# Patient Record
Sex: Female | Born: 1945 | Race: White | Hispanic: No | Smoking: Former smoker
Health system: Southern US, Community
[De-identification: ages and names within clinical notes are randomized; demographics above are authoritative.]

## PROBLEM LIST (undated history)

## (undated) DIAGNOSIS — M5380 Other specified dorsopathies, site unspecified: Secondary | ICD-10-CM

## (undated) DIAGNOSIS — M8580 Other specified disorders of bone density and structure, unspecified site: Secondary | ICD-10-CM

## (undated) DIAGNOSIS — K279 Peptic ulcer, site unspecified, unspecified as acute or chronic, without hemorrhage or perforation: Secondary | ICD-10-CM

## (undated) DIAGNOSIS — J449 Chronic obstructive pulmonary disease, unspecified: Secondary | ICD-10-CM

## (undated) DIAGNOSIS — I10 Essential (primary) hypertension: Secondary | ICD-10-CM

## (undated) DIAGNOSIS — G4700 Insomnia, unspecified: Secondary | ICD-10-CM

## (undated) DIAGNOSIS — M47816 Spondylosis without myelopathy or radiculopathy, lumbar region: Secondary | ICD-10-CM

## (undated) DIAGNOSIS — E079 Disorder of thyroid, unspecified: Secondary | ICD-10-CM

## (undated) DIAGNOSIS — G600 Hereditary motor and sensory neuropathy: Secondary | ICD-10-CM

## (undated) DIAGNOSIS — F17200 Nicotine dependence, unspecified, uncomplicated: Secondary | ICD-10-CM

## (undated) DIAGNOSIS — B009 Herpesviral infection, unspecified: Secondary | ICD-10-CM

## (undated) DIAGNOSIS — F32A Depression, unspecified: Secondary | ICD-10-CM

## (undated) DIAGNOSIS — IMO0002 Reserved for concepts with insufficient information to code with codable children: Secondary | ICD-10-CM

## (undated) DIAGNOSIS — F329 Major depressive disorder, single episode, unspecified: Secondary | ICD-10-CM

## (undated) DIAGNOSIS — M1990 Unspecified osteoarthritis, unspecified site: Secondary | ICD-10-CM

## (undated) DIAGNOSIS — G609 Hereditary and idiopathic neuropathy, unspecified: Secondary | ICD-10-CM

## (undated) DIAGNOSIS — M47814 Spondylosis without myelopathy or radiculopathy, thoracic region: Secondary | ICD-10-CM

## (undated) DIAGNOSIS — M48061 Spinal stenosis, lumbar region without neurogenic claudication: Secondary | ICD-10-CM

## (undated) DIAGNOSIS — E1142 Type 2 diabetes mellitus with diabetic polyneuropathy: Secondary | ICD-10-CM

## (undated) DIAGNOSIS — G544 Lumbosacral root disorders, not elsewhere classified: Secondary | ICD-10-CM

## (undated) DIAGNOSIS — E785 Hyperlipidemia, unspecified: Secondary | ICD-10-CM

## (undated) DIAGNOSIS — E669 Obesity, unspecified: Secondary | ICD-10-CM

## (undated) DIAGNOSIS — Z780 Asymptomatic menopausal state: Secondary | ICD-10-CM

## (undated) DIAGNOSIS — N319 Neuromuscular dysfunction of bladder, unspecified: Secondary | ICD-10-CM

## (undated) HISTORY — DX: Hyperlipidemia, unspecified: E78.5

## (undated) HISTORY — DX: Chronic obstructive pulmonary disease, unspecified: J44.9

## (undated) HISTORY — DX: Spondylosis without myelopathy or radiculopathy, lumbar region: M47.816

## (undated) HISTORY — DX: Unspecified osteoarthritis, unspecified site: M19.90

## (undated) HISTORY — DX: Type 2 diabetes mellitus with diabetic polyneuropathy: E11.42

## (undated) HISTORY — DX: Spondylosis without myelopathy or radiculopathy, thoracic region: M47.814

## (undated) HISTORY — DX: Herpesviral infection, unspecified: B00.9

## (undated) HISTORY — DX: Nicotine dependence, unspecified, uncomplicated: F17.200

## (undated) HISTORY — DX: Other specified disorders of bone density and structure, unspecified site: M85.80

## (undated) HISTORY — DX: Obesity, unspecified: E66.9

## (undated) HISTORY — DX: Disorder of thyroid, unspecified: E07.9

## (undated) HISTORY — DX: Neuromuscular dysfunction of bladder, unspecified: N31.9

## (undated) HISTORY — DX: Insomnia, unspecified: G47.00

## (undated) HISTORY — DX: Peptic ulcer, site unspecified, unspecified as acute or chronic, without hemorrhage or perforation: K27.9

## (undated) HISTORY — DX: Spinal stenosis, lumbar region without neurogenic claudication: M48.061

## (undated) HISTORY — DX: Asymptomatic menopausal state: Z78.0

## (undated) HISTORY — DX: Hereditary and idiopathic neuropathy, unspecified: G60.9

## (undated) HISTORY — PX: OTHER SURGICAL HISTORY: SHX169

## (undated) HISTORY — PX: CHOLECYSTECTOMY: SHX55

## (undated) HISTORY — DX: Essential (primary) hypertension: I10

## (undated) HISTORY — DX: Lumbosacral root disorders, not elsewhere classified: G54.4

## (undated) HISTORY — PX: REPLACEMENT TOTAL KNEE: SUR1224

## (undated) HISTORY — DX: Reserved for concepts with insufficient information to code with codable children: IMO0002

## (undated) HISTORY — DX: Other specified dorsopathies, site unspecified: M53.80

## (undated) HISTORY — DX: Major depressive disorder, single episode, unspecified: F32.9

## (undated) HISTORY — DX: Depression, unspecified: F32.A

## (undated) HISTORY — DX: Hereditary motor and sensory neuropathy: G60.0

## (undated) HISTORY — PX: ANKLE RECONSTRUCTION: SHX1151

---

## 2005-04-27 ENCOUNTER — Ambulatory Visit: Payer: Self-pay | Admitting: Family Medicine

## 2005-05-15 ENCOUNTER — Ambulatory Visit: Payer: Self-pay | Admitting: Family Medicine

## 2005-06-07 ENCOUNTER — Ambulatory Visit: Payer: Self-pay | Admitting: Family Medicine

## 2005-06-13 ENCOUNTER — Ambulatory Visit: Payer: Self-pay | Admitting: Family Medicine

## 2005-09-19 ENCOUNTER — Ambulatory Visit: Payer: Self-pay | Admitting: Family Medicine

## 2005-09-28 ENCOUNTER — Ambulatory Visit: Payer: Self-pay | Admitting: Family Medicine

## 2005-10-05 ENCOUNTER — Ambulatory Visit (HOSPITAL_COMMUNITY): Admission: RE | Admit: 2005-10-05 | Discharge: 2005-10-05 | Payer: Self-pay | Admitting: Family Medicine

## 2005-10-31 ENCOUNTER — Ambulatory Visit: Payer: Self-pay | Admitting: Family Medicine

## 2005-12-25 ENCOUNTER — Ambulatory Visit: Payer: Self-pay | Admitting: Family Medicine

## 2006-01-31 ENCOUNTER — Ambulatory Visit: Payer: Self-pay | Admitting: Family Medicine

## 2006-02-27 ENCOUNTER — Ambulatory Visit: Payer: Self-pay | Admitting: Family Medicine

## 2006-03-27 ENCOUNTER — Ambulatory Visit: Payer: Self-pay | Admitting: Family Medicine

## 2006-03-30 ENCOUNTER — Ambulatory Visit: Payer: Self-pay | Admitting: Physical Medicine & Rehabilitation

## 2006-04-06 ENCOUNTER — Ambulatory Visit: Payer: Self-pay | Admitting: Family Medicine

## 2006-05-15 DIAGNOSIS — F17200 Nicotine dependence, unspecified, uncomplicated: Secondary | ICD-10-CM | POA: Insufficient documentation

## 2006-05-15 DIAGNOSIS — E1140 Type 2 diabetes mellitus with diabetic neuropathy, unspecified: Secondary | ICD-10-CM | POA: Insufficient documentation

## 2006-05-15 DIAGNOSIS — I10 Essential (primary) hypertension: Secondary | ICD-10-CM | POA: Insufficient documentation

## 2006-05-15 DIAGNOSIS — K273 Acute peptic ulcer, site unspecified, without hemorrhage or perforation: Secondary | ICD-10-CM | POA: Insufficient documentation

## 2006-05-15 DIAGNOSIS — E785 Hyperlipidemia, unspecified: Secondary | ICD-10-CM | POA: Insufficient documentation

## 2006-05-15 DIAGNOSIS — E1169 Type 2 diabetes mellitus with other specified complication: Secondary | ICD-10-CM | POA: Insufficient documentation

## 2006-05-15 DIAGNOSIS — F339 Major depressive disorder, recurrent, unspecified: Secondary | ICD-10-CM | POA: Insufficient documentation

## 2006-05-15 DIAGNOSIS — G609 Hereditary and idiopathic neuropathy, unspecified: Secondary | ICD-10-CM | POA: Insufficient documentation

## 2006-05-15 DIAGNOSIS — E039 Hypothyroidism, unspecified: Secondary | ICD-10-CM | POA: Insufficient documentation

## 2006-05-28 ENCOUNTER — Ambulatory Visit: Payer: Self-pay | Admitting: Family Medicine

## 2006-06-22 ENCOUNTER — Ambulatory Visit: Payer: Self-pay | Admitting: Physical Medicine & Rehabilitation

## 2006-06-25 DIAGNOSIS — M5137 Other intervertebral disc degeneration, lumbosacral region: Secondary | ICD-10-CM | POA: Insufficient documentation

## 2006-06-26 ENCOUNTER — Encounter: Payer: Self-pay | Admitting: Family Medicine

## 2006-06-26 LAB — CONVERTED CEMR LAB
BUN: 27 mg/dL — ABNORMAL HIGH (ref 6–23)
CO2: 30 meq/L (ref 19–32)
Calcium: 9.9 mg/dL (ref 8.4–10.5)
Cholesterol: 152 mg/dL (ref 0–200)
Creatinine, Ser: 1.35 mg/dL — ABNORMAL HIGH (ref 0.40–1.20)
Glucose, Bld: 109 mg/dL — ABNORMAL HIGH (ref 70–99)
Total Bilirubin: 0.7 mg/dL (ref 0.3–1.2)
Total CHOL/HDL Ratio: 2.7
Triglycerides: 155 mg/dL — ABNORMAL HIGH (ref <150)
VLDL: 31 mg/dL (ref 0–40)

## 2006-06-29 ENCOUNTER — Ambulatory Visit: Payer: Self-pay | Admitting: Family Medicine

## 2006-06-29 DIAGNOSIS — I723 Aneurysm of iliac artery: Secondary | ICD-10-CM | POA: Insufficient documentation

## 2006-07-23 ENCOUNTER — Encounter: Payer: Self-pay | Admitting: Family Medicine

## 2006-08-15 ENCOUNTER — Ambulatory Visit: Payer: Self-pay | Admitting: Family Medicine

## 2006-08-16 ENCOUNTER — Encounter: Payer: Self-pay | Admitting: Family Medicine

## 2006-08-17 ENCOUNTER — Encounter: Payer: Self-pay | Admitting: Family Medicine

## 2006-08-17 ENCOUNTER — Telehealth (INDEPENDENT_AMBULATORY_CARE_PROVIDER_SITE_OTHER): Payer: Self-pay | Admitting: *Deleted

## 2006-08-29 ENCOUNTER — Ambulatory Visit: Payer: Self-pay | Admitting: Cardiology

## 2006-08-29 ENCOUNTER — Encounter: Payer: Self-pay | Admitting: Family Medicine

## 2006-08-31 ENCOUNTER — Ambulatory Visit: Payer: Self-pay | Admitting: Physical Medicine & Rehabilitation

## 2006-09-27 ENCOUNTER — Ambulatory Visit: Payer: Self-pay | Admitting: Family Medicine

## 2006-09-27 ENCOUNTER — Telehealth: Payer: Self-pay | Admitting: Family Medicine

## 2006-11-26 ENCOUNTER — Ambulatory Visit: Payer: Self-pay | Admitting: Family Medicine

## 2006-12-06 ENCOUNTER — Telehealth: Payer: Self-pay | Admitting: Family Medicine

## 2006-12-07 ENCOUNTER — Ambulatory Visit: Payer: Self-pay | Admitting: Physical Medicine & Rehabilitation

## 2006-12-19 ENCOUNTER — Telehealth: Payer: Self-pay | Admitting: Family Medicine

## 2006-12-24 ENCOUNTER — Telehealth (INDEPENDENT_AMBULATORY_CARE_PROVIDER_SITE_OTHER): Payer: Self-pay | Admitting: *Deleted

## 2007-02-19 ENCOUNTER — Ambulatory Visit: Payer: Self-pay | Admitting: Physical Medicine & Rehabilitation

## 2007-05-20 ENCOUNTER — Telehealth: Payer: Self-pay | Admitting: Family Medicine

## 2007-05-22 ENCOUNTER — Ambulatory Visit: Payer: Self-pay | Admitting: Family Medicine

## 2007-05-22 DIAGNOSIS — J449 Chronic obstructive pulmonary disease, unspecified: Secondary | ICD-10-CM | POA: Insufficient documentation

## 2007-05-31 ENCOUNTER — Ambulatory Visit: Payer: Self-pay | Admitting: Physical Medicine & Rehabilitation

## 2007-05-31 ENCOUNTER — Telehealth: Payer: Self-pay | Admitting: Family Medicine

## 2007-06-10 ENCOUNTER — Encounter: Payer: Self-pay | Admitting: Family Medicine

## 2007-06-13 ENCOUNTER — Encounter (INDEPENDENT_AMBULATORY_CARE_PROVIDER_SITE_OTHER): Payer: Self-pay | Admitting: *Deleted

## 2007-08-30 ENCOUNTER — Ambulatory Visit: Payer: Self-pay | Admitting: Physical Medicine & Rehabilitation

## 2007-10-04 ENCOUNTER — Ambulatory Visit: Payer: Self-pay | Admitting: Family Medicine

## 2007-10-04 LAB — CONVERTED CEMR LAB: Hgb A1c MFr Bld: 7.5 %

## 2007-10-10 ENCOUNTER — Telehealth: Payer: Self-pay | Admitting: Family Medicine

## 2007-10-16 ENCOUNTER — Encounter: Payer: Self-pay | Admitting: Family Medicine

## 2007-10-30 ENCOUNTER — Ambulatory Visit: Payer: Self-pay | Admitting: Cardiology

## 2007-11-11 ENCOUNTER — Encounter: Payer: Self-pay | Admitting: Family Medicine

## 2008-01-23 ENCOUNTER — Encounter: Payer: Self-pay | Admitting: Family Medicine

## 2008-01-23 ENCOUNTER — Telehealth: Payer: Self-pay | Admitting: Family Medicine

## 2008-01-24 ENCOUNTER — Encounter: Payer: Self-pay | Admitting: Family Medicine

## 2008-01-27 LAB — CONVERTED CEMR LAB: Creatinine, Ser: 1.19 mg/dL (ref 0.40–1.20)

## 2008-02-26 ENCOUNTER — Telehealth: Payer: Self-pay | Admitting: Family Medicine

## 2008-03-02 ENCOUNTER — Ambulatory Visit: Payer: Self-pay | Admitting: Family Medicine

## 2008-03-02 ENCOUNTER — Telehealth (INDEPENDENT_AMBULATORY_CARE_PROVIDER_SITE_OTHER): Payer: Self-pay | Admitting: *Deleted

## 2008-03-02 LAB — CONVERTED CEMR LAB: Blood Glucose, Fasting: 143 mg/dL

## 2008-03-27 ENCOUNTER — Ambulatory Visit: Payer: Self-pay | Admitting: Physical Medicine & Rehabilitation

## 2008-04-04 ENCOUNTER — Encounter
Admission: RE | Admit: 2008-04-04 | Discharge: 2008-04-04 | Payer: Self-pay | Admitting: Physical Medicine & Rehabilitation

## 2008-05-28 ENCOUNTER — Ambulatory Visit: Payer: Self-pay | Admitting: Family Medicine

## 2008-05-28 DIAGNOSIS — G4700 Insomnia, unspecified: Secondary | ICD-10-CM | POA: Insufficient documentation

## 2008-05-29 ENCOUNTER — Encounter: Payer: Self-pay | Admitting: Family Medicine

## 2008-06-01 ENCOUNTER — Encounter: Payer: Self-pay | Admitting: Family Medicine

## 2008-06-01 LAB — CONVERTED CEMR LAB
AST: 20 units/L (ref 0–37)
BUN: 20 mg/dL (ref 6–23)
Calcium: 9.7 mg/dL (ref 8.4–10.5)
Chloride: 101 meq/L (ref 96–112)
Creatinine, Ser: 1.1 mg/dL (ref 0.40–1.20)
Glucose, Bld: 192 mg/dL — ABNORMAL HIGH (ref 70–99)
HDL: 41 mg/dL (ref >39)
TSH: 14.931 microintl units/mL — ABNORMAL HIGH (ref 0.350–4.50)
Total CHOL/HDL Ratio: 4.2
Triglycerides: 245 mg/dL — ABNORMAL HIGH (ref <150)

## 2008-06-08 ENCOUNTER — Encounter: Payer: Self-pay | Admitting: Family Medicine

## 2008-06-12 ENCOUNTER — Ambulatory Visit: Payer: Self-pay | Admitting: Physical Medicine & Rehabilitation

## 2008-06-19 ENCOUNTER — Encounter
Admission: RE | Admit: 2008-06-19 | Discharge: 2008-08-06 | Payer: Self-pay | Admitting: Physical Medicine & Rehabilitation

## 2008-06-19 ENCOUNTER — Ambulatory Visit: Payer: Self-pay | Admitting: Family Medicine

## 2008-07-08 ENCOUNTER — Inpatient Hospital Stay (HOSPITAL_COMMUNITY): Admission: AD | Admit: 2008-07-08 | Discharge: 2008-07-09 | Payer: Self-pay | Admitting: Cardiology

## 2008-07-08 ENCOUNTER — Ambulatory Visit: Payer: Self-pay | Admitting: Cardiology

## 2008-07-09 ENCOUNTER — Encounter: Payer: Self-pay | Admitting: Cardiology

## 2008-07-22 ENCOUNTER — Ambulatory Visit: Payer: Self-pay | Admitting: Cardiology

## 2008-07-28 ENCOUNTER — Other Ambulatory Visit: Admission: RE | Admit: 2008-07-28 | Discharge: 2008-07-28 | Payer: Self-pay | Admitting: Family Medicine

## 2008-07-28 ENCOUNTER — Ambulatory Visit: Payer: Self-pay | Admitting: Family Medicine

## 2008-07-28 DIAGNOSIS — Z780 Asymptomatic menopausal state: Secondary | ICD-10-CM | POA: Insufficient documentation

## 2008-08-04 ENCOUNTER — Encounter: Admission: RE | Admit: 2008-08-04 | Discharge: 2008-08-04 | Payer: Self-pay | Admitting: Family Medicine

## 2008-08-04 ENCOUNTER — Encounter: Payer: Self-pay | Admitting: Family Medicine

## 2008-08-04 DIAGNOSIS — M810 Age-related osteoporosis without current pathological fracture: Secondary | ICD-10-CM | POA: Insufficient documentation

## 2008-08-04 LAB — HM MAMMOGRAPHY: HM Mammogram: NEGATIVE

## 2008-08-12 ENCOUNTER — Ambulatory Visit: Payer: Self-pay | Admitting: Cardiology

## 2008-08-21 ENCOUNTER — Ambulatory Visit: Payer: Self-pay | Admitting: Physical Medicine & Rehabilitation

## 2008-08-21 ENCOUNTER — Encounter: Payer: Self-pay | Admitting: Family Medicine

## 2008-08-24 ENCOUNTER — Encounter: Payer: Self-pay | Admitting: Family Medicine

## 2008-09-09 ENCOUNTER — Encounter
Admission: RE | Admit: 2008-09-09 | Discharge: 2008-09-10 | Payer: Self-pay | Admitting: Physical Medicine & Rehabilitation

## 2008-09-10 ENCOUNTER — Ambulatory Visit: Payer: Self-pay | Admitting: Physical Medicine & Rehabilitation

## 2008-10-07 ENCOUNTER — Telehealth: Payer: Self-pay | Admitting: Family Medicine

## 2008-10-09 ENCOUNTER — Ambulatory Visit: Payer: Self-pay | Admitting: Family Medicine

## 2008-10-09 ENCOUNTER — Telehealth: Payer: Self-pay | Admitting: Family Medicine

## 2008-10-09 ENCOUNTER — Ambulatory Visit: Payer: Self-pay | Admitting: Physical Medicine & Rehabilitation

## 2008-10-12 ENCOUNTER — Encounter: Payer: Self-pay | Admitting: Family Medicine

## 2008-10-15 LAB — CONVERTED CEMR LAB: Vit D, 25-Hydroxy: 57 ng/mL (ref 30–89)

## 2008-10-19 ENCOUNTER — Ambulatory Visit: Payer: Self-pay | Admitting: Family Medicine

## 2008-11-01 ENCOUNTER — Observation Stay (HOSPITAL_COMMUNITY): Admission: EM | Admit: 2008-11-01 | Discharge: 2008-11-01 | Payer: Self-pay | Admitting: Emergency Medicine

## 2008-11-04 ENCOUNTER — Ambulatory Visit: Payer: Self-pay | Admitting: Family Medicine

## 2008-11-10 ENCOUNTER — Ambulatory Visit: Payer: Self-pay | Admitting: Family Medicine

## 2008-11-10 ENCOUNTER — Telehealth (INDEPENDENT_AMBULATORY_CARE_PROVIDER_SITE_OTHER): Payer: Self-pay | Admitting: *Deleted

## 2008-11-10 LAB — CONVERTED CEMR LAB
Glucose, Urine, Semiquant: NEGATIVE
Ketones, urine, test strip: NEGATIVE
Protein, U semiquant: 30
Specific Gravity, Urine: 1.02
pH: 6

## 2008-12-03 ENCOUNTER — Ambulatory Visit: Payer: Self-pay | Admitting: Family Medicine

## 2008-12-03 DIAGNOSIS — E1142 Type 2 diabetes mellitus with diabetic polyneuropathy: Secondary | ICD-10-CM | POA: Insufficient documentation

## 2008-12-03 LAB — CONVERTED CEMR LAB: Hgb A1c MFr Bld: 7 %

## 2008-12-04 ENCOUNTER — Ambulatory Visit: Payer: Self-pay | Admitting: Physical Medicine & Rehabilitation

## 2008-12-04 ENCOUNTER — Encounter: Payer: Self-pay | Admitting: Family Medicine

## 2008-12-04 LAB — CONVERTED CEMR LAB: TSH: 1.148 microintl units/mL (ref 0.350–4.500)

## 2008-12-16 ENCOUNTER — Telehealth (INDEPENDENT_AMBULATORY_CARE_PROVIDER_SITE_OTHER): Payer: Self-pay | Admitting: *Deleted

## 2009-01-22 ENCOUNTER — Ambulatory Visit: Payer: Self-pay | Admitting: Physical Medicine & Rehabilitation

## 2009-02-06 DIAGNOSIS — M129 Arthropathy, unspecified: Secondary | ICD-10-CM

## 2009-02-06 DIAGNOSIS — K279 Peptic ulcer, site unspecified, unspecified as acute or chronic, without hemorrhage or perforation: Secondary | ICD-10-CM | POA: Insufficient documentation

## 2009-02-06 DIAGNOSIS — G600 Hereditary motor and sensory neuropathy: Secondary | ICD-10-CM | POA: Insufficient documentation

## 2009-02-06 DIAGNOSIS — G589 Mononeuropathy, unspecified: Secondary | ICD-10-CM | POA: Insufficient documentation

## 2009-02-06 DIAGNOSIS — N319 Neuromuscular dysfunction of bladder, unspecified: Secondary | ICD-10-CM | POA: Insufficient documentation

## 2009-02-06 DIAGNOSIS — E669 Obesity, unspecified: Secondary | ICD-10-CM | POA: Insufficient documentation

## 2009-02-06 DIAGNOSIS — R55 Syncope and collapse: Secondary | ICD-10-CM | POA: Insufficient documentation

## 2009-02-06 DIAGNOSIS — M1990 Unspecified osteoarthritis, unspecified site: Secondary | ICD-10-CM | POA: Insufficient documentation

## 2009-02-17 ENCOUNTER — Encounter: Payer: Self-pay | Admitting: Cardiology

## 2009-02-17 ENCOUNTER — Ambulatory Visit: Payer: Self-pay | Admitting: Cardiology

## 2009-02-25 ENCOUNTER — Telehealth: Payer: Self-pay | Admitting: Cardiology

## 2009-03-03 ENCOUNTER — Encounter: Payer: Self-pay | Admitting: Cardiology

## 2009-03-04 ENCOUNTER — Encounter: Admission: RE | Admit: 2009-03-04 | Discharge: 2009-03-04 | Payer: Self-pay | Admitting: Cardiology

## 2009-03-12 ENCOUNTER — Ambulatory Visit: Payer: Self-pay | Admitting: Physical Medicine & Rehabilitation

## 2009-04-02 ENCOUNTER — Ambulatory Visit: Payer: Self-pay | Admitting: Family Medicine

## 2009-04-13 ENCOUNTER — Encounter
Admission: RE | Admit: 2009-04-13 | Discharge: 2009-04-15 | Payer: Self-pay | Admitting: Physical Medicine & Rehabilitation

## 2009-04-15 ENCOUNTER — Ambulatory Visit: Payer: Self-pay | Admitting: Physical Medicine & Rehabilitation

## 2009-06-29 ENCOUNTER — Encounter
Admission: RE | Admit: 2009-06-29 | Discharge: 2009-07-28 | Payer: Self-pay | Admitting: Physical Medicine & Rehabilitation

## 2009-06-29 ENCOUNTER — Ambulatory Visit: Payer: Self-pay | Admitting: Physical Medicine & Rehabilitation

## 2009-07-05 ENCOUNTER — Ambulatory Visit: Payer: Self-pay | Admitting: Family Medicine

## 2009-07-15 ENCOUNTER — Telehealth (INDEPENDENT_AMBULATORY_CARE_PROVIDER_SITE_OTHER): Payer: Self-pay | Admitting: *Deleted

## 2009-07-16 ENCOUNTER — Encounter: Payer: Self-pay | Admitting: Family Medicine

## 2009-07-19 LAB — CONVERTED CEMR LAB
ALT: 21 units/L (ref 0–35)
AST: 20 units/L (ref 0–37)
BUN: 20 mg/dL (ref 6–23)
CO2: 25 meq/L (ref 19–32)
Calcium: 9.8 mg/dL (ref 8.4–10.5)
Cholesterol: 135 mg/dL (ref 0–200)
Creatinine, Ser: 1.4 mg/dL — ABNORMAL HIGH (ref 0.40–1.20)
HCT: 46.4 % — ABNORMAL HIGH (ref 36.0–46.0)
HDL: 46 mg/dL (ref >39)
Hemoglobin: 15.7 g/dL — ABNORMAL HIGH (ref 12.0–15.0)
MCV: 97.7 fL (ref 78.0–100.0)
Platelets: 264 10*3/uL (ref 150–400)
RDW: 12.6 % (ref 11.5–15.5)
T3, Free: 3.1 pg/mL (ref 2.3–4.2)
Total Bilirubin: 0.9 mg/dL (ref 0.3–1.2)
Total CHOL/HDL Ratio: 2.9
VLDL: 36 mg/dL (ref 0–40)

## 2009-08-09 ENCOUNTER — Ambulatory Visit: Payer: Self-pay | Admitting: Physical Medicine & Rehabilitation

## 2009-08-09 ENCOUNTER — Encounter
Admission: RE | Admit: 2009-08-09 | Discharge: 2009-11-07 | Payer: Self-pay | Admitting: Physical Medicine & Rehabilitation

## 2009-08-09 ENCOUNTER — Telehealth: Payer: Self-pay | Admitting: Family Medicine

## 2009-08-20 ENCOUNTER — Encounter
Admission: RE | Admit: 2009-08-20 | Discharge: 2009-08-20 | Payer: Self-pay | Admitting: Physical Medicine & Rehabilitation

## 2009-08-26 ENCOUNTER — Telehealth: Payer: Self-pay | Admitting: Family Medicine

## 2009-09-21 ENCOUNTER — Ambulatory Visit: Payer: Self-pay | Admitting: Family Medicine

## 2009-10-21 ENCOUNTER — Telehealth: Payer: Self-pay | Admitting: Family Medicine

## 2009-11-02 ENCOUNTER — Encounter: Admission: RE | Admit: 2009-11-02 | Discharge: 2009-11-02 | Payer: Self-pay | Admitting: Family Medicine

## 2009-11-02 ENCOUNTER — Ambulatory Visit: Payer: Self-pay | Admitting: Family Medicine

## 2009-11-03 LAB — CONVERTED CEMR LAB
ALT: 29 units/L (ref 0–35)
Amylase: 44 units/L (ref 0–105)
CO2: 23 meq/L (ref 19–32)
Calcium: 10.2 mg/dL (ref 8.4–10.5)
Chloride: 101 meq/L (ref 96–112)
HCT: 48.5 % — ABNORMAL HIGH (ref 36.0–46.0)
Hemoglobin: 16 g/dL — ABNORMAL HIGH (ref 12.0–15.0)
Lymphocytes Relative: 22 % (ref 12–46)
MCHC: 33 g/dL (ref 30.0–36.0)
MCV: 94 fL (ref 78.0–100.0)
Monocytes Relative: 8 % (ref 3–12)
Platelets: 287 10*3/uL (ref 150–400)
RDW: 12.6 % (ref 11.5–15.5)
Sodium: 137 meq/L (ref 135–145)
Total Protein: 7.1 g/dL (ref 6.0–8.3)

## 2009-11-04 ENCOUNTER — Encounter
Admission: RE | Admit: 2009-11-04 | Discharge: 2009-11-08 | Payer: Self-pay | Admitting: Physical Medicine & Rehabilitation

## 2009-11-08 ENCOUNTER — Ambulatory Visit: Payer: Self-pay | Admitting: Physical Medicine & Rehabilitation

## 2009-11-09 ENCOUNTER — Ambulatory Visit: Payer: Self-pay | Admitting: Family Medicine

## 2009-11-09 DIAGNOSIS — K2900 Acute gastritis without bleeding: Secondary | ICD-10-CM | POA: Insufficient documentation

## 2009-11-22 ENCOUNTER — Telehealth: Payer: Self-pay | Admitting: Family Medicine

## 2009-11-30 ENCOUNTER — Ambulatory Visit: Payer: Self-pay | Admitting: Family Medicine

## 2009-12-07 ENCOUNTER — Encounter: Payer: Self-pay | Admitting: Family Medicine

## 2009-12-28 ENCOUNTER — Telehealth: Payer: Self-pay | Admitting: Family Medicine

## 2009-12-31 ENCOUNTER — Encounter: Payer: Self-pay | Admitting: Family Medicine

## 2010-01-13 ENCOUNTER — Telehealth (INDEPENDENT_AMBULATORY_CARE_PROVIDER_SITE_OTHER): Payer: Self-pay | Admitting: *Deleted

## 2010-01-27 ENCOUNTER — Encounter
Admission: RE | Admit: 2010-01-27 | Discharge: 2010-04-27 | Payer: Self-pay | Admitting: Physical Medicine & Rehabilitation

## 2010-02-01 ENCOUNTER — Ambulatory Visit: Payer: Self-pay | Admitting: Physical Medicine & Rehabilitation

## 2010-02-04 ENCOUNTER — Encounter: Admission: RE | Admit: 2010-02-04 | Discharge: 2010-02-04 | Payer: Self-pay | Admitting: Family Medicine

## 2010-02-04 ENCOUNTER — Ambulatory Visit: Payer: Self-pay | Admitting: Family Medicine

## 2010-02-04 DIAGNOSIS — R1031 Right lower quadrant pain: Secondary | ICD-10-CM | POA: Insufficient documentation

## 2010-02-08 ENCOUNTER — Telehealth: Payer: Self-pay | Admitting: Family Medicine

## 2010-02-10 ENCOUNTER — Ambulatory Visit: Payer: Self-pay | Admitting: Family Medicine

## 2010-02-10 DIAGNOSIS — N183 Chronic kidney disease, stage 3 (moderate): Secondary | ICD-10-CM

## 2010-02-10 DIAGNOSIS — N1832 Chronic kidney disease, stage 3b: Secondary | ICD-10-CM | POA: Insufficient documentation

## 2010-02-10 DIAGNOSIS — I951 Orthostatic hypotension: Secondary | ICD-10-CM | POA: Insufficient documentation

## 2010-02-10 DIAGNOSIS — I959 Hypotension, unspecified: Secondary | ICD-10-CM | POA: Insufficient documentation

## 2010-02-10 DIAGNOSIS — B029 Zoster without complications: Secondary | ICD-10-CM | POA: Insufficient documentation

## 2010-02-11 ENCOUNTER — Encounter: Payer: Self-pay | Admitting: Family Medicine

## 2010-02-11 LAB — CONVERTED CEMR LAB
AST: 19 units/L (ref 0–37)
Albumin: 4 g/dL (ref 3.5–5.2)
Alkaline Phosphatase: 90 units/L (ref 39–117)
BUN: 24 mg/dL — ABNORMAL HIGH (ref 6–23)
Hemoglobin: 14 g/dL (ref 12.0–15.0)
MCHC: 31.7 g/dL (ref 30.0–36.0)
Potassium: 5 meq/L (ref 3.5–5.3)
RDW: 13.8 % (ref 11.5–15.5)
Sodium: 138 meq/L (ref 135–145)

## 2010-02-16 ENCOUNTER — Telehealth: Payer: Self-pay | Admitting: Family Medicine

## 2010-02-18 ENCOUNTER — Telehealth (INDEPENDENT_AMBULATORY_CARE_PROVIDER_SITE_OTHER): Payer: Self-pay | Admitting: *Deleted

## 2010-02-24 ENCOUNTER — Ambulatory Visit: Payer: Self-pay | Admitting: Family Medicine

## 2010-02-25 LAB — CONVERTED CEMR LAB
CO2: 28 meq/L (ref 19–32)
Calcium: 9.5 mg/dL (ref 8.4–10.5)
Chloride: 104 meq/L (ref 96–112)
Glucose, Bld: 149 mg/dL — ABNORMAL HIGH (ref 70–99)
Sodium: 141 meq/L (ref 135–145)

## 2010-03-09 ENCOUNTER — Ambulatory Visit: Payer: Self-pay | Admitting: Cardiology

## 2010-03-09 ENCOUNTER — Encounter: Payer: Self-pay | Admitting: Cardiology

## 2010-03-09 ENCOUNTER — Ambulatory Visit: Payer: Self-pay | Admitting: Family Medicine

## 2010-03-09 DIAGNOSIS — H60399 Other infective otitis externa, unspecified ear: Secondary | ICD-10-CM | POA: Insufficient documentation

## 2010-03-09 DIAGNOSIS — R609 Edema, unspecified: Secondary | ICD-10-CM | POA: Insufficient documentation

## 2010-03-11 ENCOUNTER — Telehealth: Payer: Self-pay | Admitting: Family Medicine

## 2010-03-15 ENCOUNTER — Encounter: Admission: RE | Admit: 2010-03-15 | Discharge: 2010-03-15 | Payer: Self-pay | Admitting: Cardiology

## 2010-03-17 ENCOUNTER — Ambulatory Visit: Payer: Self-pay | Admitting: Physical Medicine & Rehabilitation

## 2010-03-25 ENCOUNTER — Ambulatory Visit: Payer: Self-pay | Admitting: Family Medicine

## 2010-03-25 DIAGNOSIS — R112 Nausea with vomiting, unspecified: Secondary | ICD-10-CM | POA: Insufficient documentation

## 2010-03-26 ENCOUNTER — Encounter: Payer: Self-pay | Admitting: Family Medicine

## 2010-03-28 LAB — CONVERTED CEMR LAB
BUN: 13 mg/dL (ref 6–23)
CO2: 26 meq/L (ref 19–32)
Calcium: 10.4 mg/dL (ref 8.4–10.5)
Creatinine, Ser: 1.1 mg/dL (ref 0.40–1.20)
Glucose, Bld: 156 mg/dL — ABNORMAL HIGH (ref 70–99)

## 2010-04-05 ENCOUNTER — Encounter: Payer: Self-pay | Admitting: Family Medicine

## 2010-04-06 ENCOUNTER — Encounter: Payer: Self-pay | Admitting: Family Medicine

## 2010-04-28 ENCOUNTER — Encounter: Payer: Self-pay | Admitting: Family Medicine

## 2010-05-06 ENCOUNTER — Ambulatory Visit: Payer: Self-pay | Admitting: Family Medicine

## 2010-06-13 ENCOUNTER — Encounter
Admission: RE | Admit: 2010-06-13 | Discharge: 2010-06-17 | Payer: Self-pay | Source: Home / Self Care | Attending: Physical Medicine & Rehabilitation | Admitting: Physical Medicine & Rehabilitation

## 2010-06-17 ENCOUNTER — Ambulatory Visit: Payer: Self-pay | Admitting: Physical Medicine & Rehabilitation

## 2010-06-21 ENCOUNTER — Telehealth (INDEPENDENT_AMBULATORY_CARE_PROVIDER_SITE_OTHER): Payer: Self-pay | Admitting: *Deleted

## 2010-06-21 ENCOUNTER — Encounter: Admission: RE | Admit: 2010-06-21 | Discharge: 2010-06-21 | Payer: Self-pay | Admitting: Family Medicine

## 2010-06-21 ENCOUNTER — Ambulatory Visit: Payer: Self-pay | Admitting: Family Medicine

## 2010-06-21 DIAGNOSIS — R141 Gas pain: Secondary | ICD-10-CM | POA: Insufficient documentation

## 2010-06-21 DIAGNOSIS — K219 Gastro-esophageal reflux disease without esophagitis: Secondary | ICD-10-CM | POA: Insufficient documentation

## 2010-06-21 DIAGNOSIS — M542 Cervicalgia: Secondary | ICD-10-CM | POA: Insufficient documentation

## 2010-06-21 DIAGNOSIS — R142 Eructation: Secondary | ICD-10-CM

## 2010-06-21 DIAGNOSIS — M62838 Other muscle spasm: Secondary | ICD-10-CM | POA: Insufficient documentation

## 2010-06-21 DIAGNOSIS — R143 Flatulence: Secondary | ICD-10-CM

## 2010-06-22 LAB — CONVERTED CEMR LAB
ALT: 34 units/L (ref 0–35)
Albumin: 4.1 g/dL (ref 3.5–5.2)
Bilirubin, Direct: 0.2 mg/dL (ref 0.0–0.3)
Hgb A1c MFr Bld: 8.6 % — ABNORMAL HIGH (ref <5.7)
TSH: 6.752 microintl units/mL — ABNORMAL HIGH (ref 0.350–4.500)
Total Protein: 6.9 g/dL (ref 6.0–8.3)

## 2010-06-28 ENCOUNTER — Encounter: Payer: Self-pay | Admitting: Family Medicine

## 2010-07-07 ENCOUNTER — Encounter: Payer: Self-pay | Admitting: Family Medicine

## 2010-07-11 ENCOUNTER — Encounter: Payer: Self-pay | Admitting: Family Medicine

## 2010-07-14 ENCOUNTER — Encounter: Payer: Self-pay | Admitting: Family Medicine

## 2010-07-14 ENCOUNTER — Ambulatory Visit: Payer: Self-pay | Admitting: Family Medicine

## 2010-07-14 DIAGNOSIS — I714 Abdominal aortic aneurysm, without rupture, unspecified: Secondary | ICD-10-CM | POA: Insufficient documentation

## 2010-07-14 DIAGNOSIS — I701 Atherosclerosis of renal artery: Secondary | ICD-10-CM | POA: Insufficient documentation

## 2010-07-15 LAB — CONVERTED CEMR LAB
BUN: 13 mg/dL (ref 6–23)
Calcium: 9.2 mg/dL (ref 8.4–10.5)
Creatinine, Ser: 1.08 mg/dL (ref 0.40–1.20)

## 2010-08-12 ENCOUNTER — Ambulatory Visit
Admission: RE | Admit: 2010-08-12 | Discharge: 2010-08-12 | Payer: Self-pay | Source: Home / Self Care | Attending: Vascular Surgery | Admitting: Vascular Surgery

## 2010-08-12 ENCOUNTER — Encounter: Payer: Self-pay | Admitting: Family Medicine

## 2010-08-22 ENCOUNTER — Telehealth: Payer: Self-pay | Admitting: Cardiology

## 2010-08-22 ENCOUNTER — Ambulatory Visit: Admit: 2010-08-22 | Payer: Self-pay | Admitting: Family Medicine

## 2010-08-23 ENCOUNTER — Ambulatory Visit
Admission: RE | Admit: 2010-08-23 | Discharge: 2010-08-23 | Payer: Self-pay | Source: Home / Self Care | Attending: Vascular Surgery | Admitting: Vascular Surgery

## 2010-08-23 ENCOUNTER — Ambulatory Visit: Admission: RE | Admit: 2010-08-23 | Payer: Self-pay | Source: Home / Self Care | Admitting: Vascular Surgery

## 2010-08-26 ENCOUNTER — Ambulatory Visit
Admission: RE | Admit: 2010-08-26 | Discharge: 2010-08-26 | Payer: Self-pay | Source: Home / Self Care | Attending: Vascular Surgery | Admitting: Vascular Surgery

## 2010-08-26 ENCOUNTER — Encounter: Payer: Self-pay | Admitting: Family Medicine

## 2010-08-26 NOTE — Assessment & Plan Note (Addendum)
OFFICE VISIT  [name], [name] MAE DOB:  06-13-1946                                       08/26/2010 [id number]#:[id number]  This is an established patient follow-up.  HISTORY OF PRESENT ILLNESS:  This is a 65 year old female that I had previously seen returns for follow-up on her aortoiliac duplex and renal duplex. 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  At this point the patient notes that she feels her blood pressure is well-controlled at home and that she has had no recent increases in her blood pressure medication.  She is not having any pain in her legs or any type of abdominal symptomatology.  On physical examination today, her blood pressure is 153/100, heart rate of 50, respirations of 12, satting 95% on room air.  She had recently smoked prior to these measurements.  On physical examination today, she does not have any obvious prominent pulse in the right lower quadrant. No abdominal pain whatsoever.  NONINVASIVE VASCULAR IMAGING:  She had a renal artery duplex completed. It demonstrated a greater than 60% stenosis of the left proximal renal artery.  Also an aortoiliac duplex demonstrates a widely patent aorta with no aneurysmal changes.  There is a 2.6 cm x 2.4 cm right common iliac artery aneurysm.  The left common iliac artery is normal in size.  MEDICAL DECISION MAKING:  This is a 65 year old female with a left renal artery stenosis and also a right common iliac artery aneurysm.  As I discussed with her previously, it is less than the 3.5 cm threshold for repair.  I would continue surveillance and check it again in 6 months. In regards to the renal artery stenosis, at this point the data for renal artery stenting is somewhat controversial.  Patient feels that her blood pressure regimen has been stable, and she is not having any complications for her currently blood pressure regimen and would prefer to  manage this medically.  I discussed with her a 74-month trial with her current medications and will repeat the renal duplex in 3 months.  If at that point her blood pressure medications are escalating in number and dosages or if she is having symptomatology from side effects from her medications or if her left kidney is shrinking on the renal artery duplex, at that point I would readdress with her possibly proceeding with a possible renal artery angiogram and stenting.  At this point the patient is fine with this plan of action and will re-do the duplex in 3 months, and she will follow up with me.    [name] Hertz, MD Electronically Signed  BLC/MEDQ  D:  08/26/2010  T:  08/26/2010  Job:  [id number]  cc:   [name], D.O.

## 2010-08-28 ENCOUNTER — Encounter: Payer: Self-pay | Admitting: Family Medicine

## 2010-08-29 ENCOUNTER — Encounter: Payer: Self-pay | Admitting: Cardiology

## 2010-08-30 NOTE — Procedures (Unsigned)
RENAL ARTERY DUPLEX EVALUATION  INDICATION:  Left renal artery stenosis and right common iliac artery aneurysm by CTs on 03/15/2010 and 06/28/2010.  HISTORY: Diabetes:  Yes. Cardiac:  No. Hypertension:  Yes. Smoking:  Yes.  RENAL ARTERY DUPLEX FINDINGS: Aorta-Proximal:  40 cm/s Aorta-Mid:  44 cm/s Aorta-Distal:  52 cm/s Celiac Artery Origin:  128/27 cm/s SMA Origin:  141/30 cm/s                                   RIGHT               LEFT Renal Artery Origin:             Not visualized      201 cm/s Renal Artery Proximal:           67 cm/s             332 cm/s Renal Artery Mid:                56 cm/s             127 cm/s Renal Artery Distal:             61 cm/s             76 cm/s Hilar Acceleration Time (AT): Renal-Aortic Ratio (RAR):        1.5                 7.5 Kidney Size:                     10.3 cm             9.6 cm End Diastolic Ratio (EDR): Resistive Index (RI):            0.71                0.69  IMPRESSION: 1. Doppler velocities and renal aortic ratios suggest >60% stenosis of     the left proximal renal artery and no hemodynamically significant     stenosis of the right renal arteries, based on limited     visualization due to overlying bowel gas patterns and patient body     habitus. 2. The bilateral kidney length measurements are within normal limits;     however, the left kidney measured slightly smaller than the right.     The bilateral interrenal resistive indices were within normal     limits. 3. Patent abdominal aorta, proximal celiac, proximal superior     mesenteric, and bilateral common iliac arteries noted with no     evidence of significant increase in velocities. 4. No evidence of aneurysmal dilatation of the abdominal aorta or left     common iliac artery noted.  The right common iliac artery     demonstrates a 2.6 X 2.4 cm aneurysm, based on limited     visualization.  ___________________________________________ Fransisco Hertz, MD  CH/MEDQ  D:  08/24/2010  T:  08/24/2010  Job:  [id number]

## 2010-09-06 NOTE — Assessment & Plan Note (Signed)
Summary: GERD/ swelling   Vital Signs:  Patient profile:   65 year old female Height:      67 inches Weight:      218 pounds BMI:     34.27 O2 Sat:      96 % on Room air Temp:     98.6 degrees F oral Pulse rate:   65 / minute BP sitting:   142 / 82  (left arm) Cuff size:   large  Vitals Entered By: Payton Spark CMA (June 21, 2010 10:22 AM)  O2 Flow:  Room air CC: Discuss Digestion, walking, dizziness and fluid pill   Primary Care Provider:  Seymour Bars DO  CC:  Discuss Digestion, walking, and dizziness and fluid pill.  History of Present Illness: 65 yo WF presents for abdominal distension.  She is on diuretics every other day which helps with her flud retention.  She is no longer having N/V.  She still has severe indigestion and she is on Dexilant.  She has not had an EGD/ colonoscpy since 04 (in [place]).  Has some intermittent constipation, no melena or hematochezia.  No wt loss but claims to have a poor appetite.  Denies diarrhea.  Had labs done last visit and had a normal lipase and renal function.    She continues to have episodic dizziness esp with moving her head too quickly.  Denies any rotational component and it doesn't happen everytime.  She has gained 5 lbs and is no longer walking.  She has chronic neck pain and HAs with hx of lumbar DDD but has never had her neck looked at.  Dr Jess Barters recently increased her dose of gabapentin.    She is due to have her TSH rechecked after we lowered her dose in July.   She is overdue for A1C and urine microalbumin. Her BP has improved since lst visit and she had a visit with ENT where they treated her chronic otitis external which has much improved.      Current Medications (verified): 1)  Aspirin 81 Mg Tbec (Aspirin) .Marland Kitchen.. 1 Tab By Mouth Qd 2)  Nitrostat 0.4 Mg Subl (Nitroglycerin) .... Dissolve One Tablet Sublingual Every 5 Minutes As Needed Chest Pain. Max Dose 3tabs in 15 Minutes 3)  Levothyroxine Sodium 100 Mcg Tabs  (Levothyroxine Sodium) .Marland Kitchen.. 1 Tab By Mouth Daily 4)  Gabapentin 100 Mg Caps (Gabapentin) .Marland Kitchen.. 1 Capsule By Mouth in [facility] and At [facility].  2 Capsules At Night 5)  Tramadol Hcl 50 Mg Tabs (Tramadol Hcl) .... 2 Tabs By Mouth Tid 6)  Metoprolol Tartrate 100 Mg Tabs (Metoprolol Tartrate) .Marland Kitchen.. 1 Tab By Mouth Bid 7)  Simvastatin 20 Mg Tabs (Simvastatin) .Marland Kitchen.. 1 Tab By Mouth Qhs 8)  Zolpidem Tartrate 10 Mg Tabs (Zolpidem Tartrate) .Marland Kitchen.. 1 Tab By Mouth At Bedtime As Needed Sleep 9)  Glyburide 5 Mg Tabs (Glyburide) .Marland Kitchen.. 1 Tab By Mouth Two Times A Day With Meals 10)  Dexilant 60 Mg Cpdr (Dexlansoprazole) .... Take 1 Tab By Mouth Once Daily 11)  Acyclovir 800 Mg Tabs (Acyclovir) .Marland Kitchen.. 1 Tab By Mouth 5 X A Day X 7 Days 12)  Furosemide 40 Mg Tabs (Furosemide) .Marland Kitchen.. 1 Tab By Mouth Every Other Day 13)  Metformin Hcl 500 Mg Tabs (Metformin Hcl) .... Take 1 Tab By Mouth Two Times A Day  Allergies (verified): No Known Drug Allergies  Past History:  Past Medical History: Reviewed history from 03/09/2010 and no changes required. 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) TOBACCO DEPENDENCE (ICD-305.1) HYPOTHYROIDISM, UNSPECIFIED (ICD-244.9) GASTRIC ULCER ACUTE WITHOUT HEMORRHAGE (ICD-533.30) DIABETES MELLITUS II, UNCOMPLICATED (ICD-250.00) DEPRESSION, MAJOR, RECURRENT (ICD-296.30) OBESITY (ICD-278.00)  Lumbar DDD without spinal stenosis (CT 11-07) [id number],  lipomas R common iliac artery aneurysm 2 CM ,due for CTA 01-2009 subclinical bilat cataracts diabetic peripheral neuropathy - Dr Jess Barters Optho: Dr Jeananne Rama at [facility] cards Dr Jens Som, normal  [facility], [facility], [facility] 12-09  Past Surgical History: Reviewed history from 02/06/2009 and no changes required. Cholecystectomy  R TKR This is a left L5 dorsal ramus injection, left L4 medial branch block,  and bilateral L3 medial branch block under fluoroscopic guidance.  .Fibroid tumor removal and knee replacement.   Family History: Reviewed history from 07/28/2008 and no changes required. 2 kids- healthy  Father DIED at 41, AMI @ 67; HTN, NIDDM, LUNG CANCER Mother died of stroke in 32s brother ?  Social History: Reviewed history from 10/09/2008 and no changes required. Recently moved here from [place].  Widowed- husband died of ALS.  Out of work due to back and leg pain.  Lives alone, but watches grandkids.  No insurance.  Smokes 1/2ppd. - trying to quit.  Wants to start water aerobics.  Depressed mood since loss of husband. Uses cane.    Review of Systems      See HPI  Physical Exam  General:  alert, well-developed, well-nourished, and well-hydrated.  obese Head:  normocephalic and atraumatic.   Eyes:  sclera non icteric Mouth:  pharynx pink and moist and fair dentition.   Neck:  no masses.   Lungs:  normal respiratory effort.  Lungs CTA. prolonged exp phase Heart:  normal rate, regular rhythm, no murmur, no gallop, and no rub.   Abdomen:  soft, mildly distended with slight epigastric TTP no guarding or rigidity Extremities:  1+ non pitting LE edema bilat Skin:  no jaundice or pallor Cervical Nodes:  No lymphadenopathy noted Psych:  flat affect.     Impression & Recommendations:  Problem # 1:  GERD (ICD-530.81) Assessment Deteriorated Epigastric pain, fullness and dyspepsia have not improved even with Dexilant.  Last scope 04.  Will set her up with [facility].   Her updated medication list for this problem includes:    Dexilant 60 Mg Cpdr (Dexlansoprazole) .Marland Kitchen... Take 1 tab by mouth once daily  Orders: Gastroenterology Referral  (GI)  Problem # 2:  ABDOMINAL DISTENSION (ICD-787.3) Will add additional labs today.  GI refferal has been made.   Orders: T-Liver Profile [phone]) Gastroenterology Referral (GI)  Problem # 3:  NECK PAIN, CHRONIC (ICD-723.1) Due for XRAY.  I suspect that she has DDD in her C spine like she does in her L spine.  She has recently was increased on her dose of gapabetin.  Avoid NSAIDs with CKD II.  PT or chiropractic manipulation is likely to help her neck pain and related HAs. Her updated medication list for this problem includes:    Aspirin 81 Mg Tbec (Aspirin) .Marland Kitchen... 1 tab by mouth qd    Tramadol Hcl 50 Mg Tabs (Tramadol hcl) .Marland Kitchen... 2 tabs by mouth tid  Orders: T-DG Cervical Spine 2-3 Views (56213)  Problem # 4:  HYPOTHYROIDISM, UNSPECIFIED (ICD-244.9) Due to repeat. Her updated medication list for this problem includes:    Levothyroxine Sodium 100 Mcg Tabs (Levothyroxine sodium) .Marland KitchenMarland KitchenMarland KitchenMarland Kitchen  1 tab by mouth daily  Orders: T-TSH [phone])  Labs Reviewed: TSH: 0.064 (02/11/2010)    HgBA1c: 7.4 (07/05/2009) Chol: 135 (07/16/2009)   HDL: 46 (07/16/2009)   LDL: 53 (07/16/2009)   TG: 179 (07/16/2009)  Problem # 5:  DIABETES MELLITUS II, UNCOMPLICATED (ICD-250.00) Complicated by nephropathy and neuropathy. Seeing Dr Jess Barters.  Home sugars are fair.  Due for A1C and urine microalbumin.   Her updated medication list for this problem includes:    Aspirin 81 Mg Tbec (Aspirin) .Marland Kitchen... 1 tab by mouth qd    Glyburide 5 Mg Tabs (Glyburide) .Marland Kitchen... 1 tab by mouth two times a day with meals    Metformin Hcl 500 Mg Tabs (Metformin hcl) .Marland Kitchen... Take 1 tab by mouth two times a day  Orders: T-Urine Microalbumin w/creat. ratio [phone]) T-Hemoglobin A1C (57846)  Problem # 6:  OTITIS EXTERNA (ICD-380.10) Much improved after seeing ENT and treated with boric acid cystals, derm otic and as needed use of Lotrisone cream.  Also working on keeping fingers out of ears.  Complete Medication  List: 1)  Aspirin 81 Mg Tbec (Aspirin) .Marland Kitchen.. 1 tab by mouth qd 2)  Nitrostat 0.4 Mg Subl (Nitroglycerin) .... Dissolve one tablet sublingual every 5 minutes as needed chest pain. max dose 3tabs in 15 minutes 3)  Levothyroxine Sodium 100 Mcg Tabs (Levothyroxine sodium) .Marland Kitchen.. 1 tab by mouth daily 4)  Gabapentin 300 Mg Caps (Gabapentin) .Marland Kitchen.. 1 capsule by mouth bid 5)  Tramadol Hcl 50 Mg Tabs (Tramadol hcl) .... 2 tabs by mouth tid 6)  Metoprolol Tartrate 100 Mg Tabs (Metoprolol tartrate) .Marland Kitchen.. 1 tab by mouth bid 7)  Simvastatin 20 Mg Tabs (Simvastatin) .Marland Kitchen.. 1 tab by mouth qhs 8)  Zolpidem Tartrate 10 Mg Tabs (Zolpidem tartrate) .Marland Kitchen.. 1 tab by mouth at bedtime as needed sleep 9)  Glyburide 5 Mg Tabs (Glyburide) .Marland Kitchen.. 1 tab by mouth two times a day with meals 10)  Dexilant 60 Mg Cpdr (Dexlansoprazole) .... Take 1 tab by mouth once daily 11)  Acyclovir 800 Mg Tabs (Acyclovir) .Marland Kitchen.. 1 tab by mouth 5 x a day x 7 days 12)  Furosemide 40 Mg Tabs (Furosemide) .Marland Kitchen.. 1 tab by mouth qam 13)  Metformin Hcl 500 Mg Tabs (Metformin hcl) .... Take 1 tab by mouth two times a day  Patient Instructions: 1)  Change Furosemide to EVERYDAY. 2)  Eat 1 banana/ day while on this daily. 3)  Will recheck kidney function in 4 wks. 4)  Update labs today. 5)  Will call you w/ results tomorrow. 6)  GI referral made. 7)  Neck XRay today. 8)  Increase Gabapentin to 300 mg 2 x a day for nerve root pain. 9)  Return for follow up in 2 mos. Prescriptions: GABAPENTIN 300 MG CAPS (GABAPENTIN) 1 capsule by mouth bid  #60 x 3   Entered and Authorized by:   Seymour Bars DO   Signed by:   Seymour Bars DO on 06/21/2010   Method used:   Electronically to        [facility] [phone]* (retail)       [address] [place], [place]  [address]       Ph: [phone]       Fax: [phone]   RxID:   [id number] FUROSEMIDE 40 MG TABS (FUROSEMIDE) 1 tab by mouth qAM  #30 x 3   Entered and Authorized by:   Seymour Bars DO   Signed by:  Seymour Bars DO on 06/21/2010   Method used:   Electronically to        [facility] [phone]* (retail)       [address] [place], [place]  [address]       Ph: [phone]       Fax: [phone]   RxID:   [id number]    Orders Added: 1)  T-Urine Microalbumin w/creat. ratio [[id number]] 2)  T-Hemoglobin A1C [23375] 3)  T-TSH [[id number]] 4)  T-Liver Profile [[id number]] 5)  T-DG Cervical Spine 2-3 Views [72040] 6)  Gastroenterology Referral [GI] 7)  Est. Patient Level IV [01601]

## 2010-09-06 NOTE — Progress Notes (Signed)
       New/Updated Medications: BAYER CONTOUR TEST  STRP (GLUCOSE BLOOD) Use as directed to check blood sugar two times a day LANCETS  MISC (LANCETS) Use as directed to check blood cugar two times a day Prescriptions: LANCETS  MISC (LANCETS) Use as directed to check blood cugar two times a day  #100 x 3   Entered by:   Payton Spark CMA   Authorized by:   Seymour Bars DO   Signed by:   Payton Spark CMA on 06/21/2010   Method used:   Electronically to        [facility] [phone]* (retail)       [address] [place], [place]  [address]       Ph: [phone]       Fax: [phone]   RxID:   [id number] BAYER CONTOUR TEST  STRP (GLUCOSE BLOOD) Use as directed to check blood sugar two times a day  #100 x 3   Entered by:   Payton Spark CMA   Authorized by:   Seymour Bars DO   Signed by:   Payton Spark CMA on 06/21/2010   Method used:   Electronically to        [facility] [phone]* (retail)       [address] [place], [place]  [address]       Ph: [phone]       Fax: [phone]   RxID:   [id number]

## 2010-09-06 NOTE — Letter (Signed)
Summary: [facility]  [facility]   Imported By: Lanelle Bal 04/21/2010 11:22:05  _____________________________________________________________________  External Attachment:    Type:   Image     Comment:   External Document

## 2010-09-06 NOTE — Consult Note (Signed)
Summary: [facility]  [facility]   Imported By: Maryln Gottron 07/15/2010 10:30:06  _____________________________________________________________________  External Attachment:    Type:   Image     Comment:   External Document

## 2010-09-06 NOTE — Assessment & Plan Note (Signed)
Summary: flu shot - jr  Nurse Visit   Allergies: No Known Drug Allergies  Orders Added: 1)  Flu Vaccine 27yrs + MEDICARE PATIENTS [Q2039] 2)  Administration Flu vaccine - MCR [G0008] Flu Vaccine Consent Questions     Do you have a history of severe allergic reactions to this vaccine? no    Any prior history of allergic reactions to egg and/or gelatin? no    Do you have a sensitivity to the preservative Thimersol? no    Do you have a past history of Guillan-Barre Syndrome? no    Do you currently have an acute febrile illness? no    Have you ever had a severe reaction to latex? no    Vaccine information given and explained to patient? yes    Are you currently pregnant? no    Lot Number:[id number]   Exp Date:02/04/2011   Site Given  Left Deltoid IMmedflu

## 2010-09-06 NOTE — Assessment & Plan Note (Signed)
Summary: f/u labs   Vital Signs:  Patient profile:   65 year old female Height:      67 inches Weight:      211 pounds BMI:     33.17 O2 Sat:      98 % on Room air Pulse rate:   56 / minute BP sitting:   95 / 58  (left arm) Cuff size:   large  Vitals Entered By: Payton Spark CMA (February 10, 2010 10:48 AM)  O2 Flow:  Room air CC: F/U   Primary Care Provider:  Seymour Bars DO  CC:  F/U.  History of Present Illness: 65 yo WF presents for f/u abdominal pain and nausea which has resolved on it's own.  She had a normal Acute abdominal series.  She admits to years of alternating diarrhea and constipation.  She has been under a lot of stress.  Her son has been sick in [place] with weight loss and she is worried bout him.  She is eating and has a fairly poor appetite.  She is not losing weight.    She is still having low BPs even w/o chorthalidone but denies being weak or lightheaded.  She has started to have pain and a blistering rash over her tailbone x 2 days.  No fevers.      Current Medications (verified): 1)  Aspirin 81 Mg Tbec (Aspirin) .Marland Kitchen.. 1 Tab By Mouth Qd 2)  Lisinopril 20 Mg Tabs (Lisinopril) .Marland Kitchen.. 1 Tab By Mouth Bid 3)  Nitrostat 0.4 Mg Subl (Nitroglycerin) .... Dissolve One Tablet Sublingual Every 5 Minutes As Needed Chest Pain. Max Dose 3tabs in 15 Minutes 4)  Levothyroxine Sodium 150 Mcg Tabs (Levothyroxine Sodium) .Marland Kitchen.. 1 Tab By Mouth Daily 5)  Gabapentin 100 Mg Caps (Gabapentin) .Marland Kitchen.. 1 Capsule By Mouth in The Am and At [facility]  2 Capsules At Night 6)  Tramadol Hcl 50 Mg Tabs (Tramadol Hcl) .... 2 Tabs By Mouth Tid 7)  Metoprolol Tartrate 100 Mg Tabs (Metoprolol Tartrate) .Marland Kitchen.. 1 Tab By Mouth Bid 8)  Simvastatin 20 Mg Tabs (Simvastatin) .Marland Kitchen.. 1 Tab By Mouth Qhs 9)  Zolpidem Tartrate 10 Mg Tabs (Zolpidem Tartrate) .Marland Kitchen.. 1 Tab By Mouth At Bedtime As Needed Sleep 10)  Glyburide 5 Mg Tabs (Glyburide) .Marland Kitchen.. 1 Tab By Mouth Two Times A Day With Food 11)  Fluoxetine Hcl 20 Mg Tabs  (Fluoxetine Hcl) .Marland Kitchen.. 1 Tab By Mouth Daily 12)  Metformin Hcl 500 Mg Tabs (Metformin Hcl) .Marland Kitchen.. 1 Tab By Mouth Two Times A Day 13)  Ascensia Contour Test Strips .... Use As Directed To Check Blood Sugar Two Times A Day Dx:250.0 14)  Lancets  Misc (Lancets) .... Use As Directed To Check Blood Sugar Two Times A Day Dx:250.0 15)  Dexilant 60 Mg Cpdr (Dexlansoprazole) .... Take 1 Tab By Mouth Once Daily  Allergies (verified): No Known Drug Allergies  Past History:  Past Medical History: Reviewed history from 04/02/2009 and no changes required. 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) TOBACCO DEPENDENCE (ICD-305.1) HYPOTHYROIDISM, UNSPECIFIED (ICD-244.9) GASTRIC ULCER ACUTE WITHOUT HEMORRHAGE (ICD-533.30) DIABETES MELLITUS II, UNCOMPLICATED (ICD-250.00) DEPRESSION, MAJOR, RECURRENT (ICD-296.30) OBESITY (ICD-278.00)  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 Cholecystectomy  R TKR This is a left L5 dorsal ramus injection, left L4 medial branch block,  and bilateral L3 medial branch block under fluoroscopic guidance.  .Fibroid tumor removal and knee replacement.   Family History: Reviewed history from 07/28/2008 and no changes required. 2 kids- healthy   Father DIED at 42, AMI @ 67; HTN, NIDDM, LUNG CANCER Mother died of stroke in 90s brother ?  Social History: Reviewed history from 10/09/2008 and no changes required. Recently moved here from [place].  Widowed- husband died of ALS.  Out of work due to back and leg pain.  Lives alone, but watches grandkids.  No insurance.  Smokes 1/2ppd. - trying to quit.  Wants to start water aerobics.  Depressed mood since loss of husband. Uses cane.    Review of Systems      See HPI  Physical Exam  General:  alert, well-developed, well-nourished, and well-hydrated.  overwt; using walker Head:  normocephalic and atraumatic.   Mouth:  pharynx pink and moist and fair dentition.   Neck:  no masses.   Lungs:  normal respiratory effort.  Lungs CTA. prolonged exp phase Heart:  normal rate and regular rhythm.   Abdomen:  soft, non-tender, normal bowel sounds, no distention, no guarding, no hepatomegaly, and no splenomegaly.   Extremities:  1+ nonpitting LE edema Skin:  erythematous vesicular rash over the R buttock in a small crop Psych:  good eye contact, not anxious appearing, and not depressed appearing.     Impression & Recommendations:  Problem # 1:  RLQ PAIN (ICD-789.03) Assessment Improved Improved.  Had a normal AAS.  Will get CMP today.  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   Orders: T-Comprehensive Metabolic Panel ([id number])  Problem # 2:  HYPOTENSION (ICD-458.9) BP still low even off chlorthalidone.  Hold Lisinopril and recheck in 10 days.  CMP today.  Fluid rehydration.  No sign of infection or bleeding.  Problem # 3:  RENAL INSUFFICIENCY (ICD-588.9) CMP today reviewed renal insuff.  Will hold her ACE and Metformin, work on fluid hydration, keep off the chlorthalidone and recheck wt/ BMP and BP in 10 days.  Problem # 4:  HERPES ZOSTER (ICD-053.9) Treat with Acyclovir.  Declined need for pain meds.  Complete Medication List: 1)  Aspirin 81 Mg Tbec  (Aspirin) .Marland Kitchen.. 1 tab by mouth qd 2)  Nitrostat 0.4 Mg Subl (Nitroglycerin) .... Dissolve one tablet sublingual every 5 minutes as needed chest pain. max dose 3tabs in 15 minutes 3)  Levothyroxine Sodium 100 Mcg Tabs (Levothyroxine sodium) .Marland Kitchen.. 1 tab by mouth daily 4)  Gabapentin 100 Mg Caps (Gabapentin) .Marland Kitchen.. 1 capsule by mouth in the am and at noon.  2 capsules at night 5)  Tramadol Hcl 50 Mg Tabs (Tramadol hcl) .... 2 tabs by mouth tid 6)  Metoprolol Tartrate 100 Mg Tabs (Metoprolol tartrate) .Marland Kitchen.. 1 tab by mouth bid 7)  Simvastatin 20 Mg Tabs (Simvastatin) .Marland Kitchen.. 1 tab by mouth qhs 8)  Zolpidem Tartrate 10 Mg Tabs (Zolpidem tartrate) .Marland Kitchen.. 1 tab by mouth at bedtime as needed sleep 9)  Glyburide 5 Mg Tabs (Glyburide) .Marland Kitchen.. 1 tab by mouth two times a day with food 10)  Dexilant 60 Mg Cpdr (Dexlansoprazole) .... Take 1 tab by mouth once daily 11)  Acyclovir 800 Mg Tabs (Acyclovir) .Marland Kitchen.. 1 tab by mouth 5 x a day x 7 days  Other Orders: T-TSH ([id number]) T-CBC No Diff ([id number])  Patient Instructions: 1)  Start Acyclovir 5 x a day for shingles rash. 2)  HOLD LISINOPRIL. 3)  Labs today. 4)  Will call you w/ results tomorrow. 5)  Start Benefiber once a day for IBS. 6)  Use Apidra for sliding scale insulin only as needed for procedure. 7)  F/U in 6 wks. Prescriptions: LEVOTHYROXINE SODIUM 100 MCG TABS (LEVOTHYROXINE SODIUM) 1 tab by mouth daily  #30 x 1   Entered and Authorized by:   Seymour Bars DO   Signed by:   Seymour Bars DO on 02/11/2010   Method used:   Electronically to        [facility] [phone]* (retail)       [address] [place], [place]  [address]       Ph: [phone]       Fax: [phone]   RxID:   [id number] ACYCLOVIR 800 MG TABS (ACYCLOVIR) 1 tab by mouth 5 x a day x 7 days  #35 x 0   Entered and Authorized by:   Seymour Bars DO   Signed by:   Seymour Bars DO on 02/10/2010   Method used:   Electronically to        [facility] [phone]* (retail)        [address] [place], [place]  [address]       Ph: [phone]       Fax: [phone]   RxID:   [id number]

## 2010-09-06 NOTE — Assessment & Plan Note (Signed)
Summary: N/V   Vital Signs:  Patient profile:   65 year old female Height:      67 inches Weight:      210 pounds BMI:     33.01 O2 Sat:      97 % on Room air Temp:     97.4 degrees F oral Pulse rate:   65 / minute Pulse (ortho):   67 / minute BP standing:   156 / 104  Vitals Entered By: Payton Spark CMA (November 02, 2009 1:29 PM)  O2 Flow:  Room air CC: HA, nausea, vomiting and chills x 4 days. unable to eat or drink w/out vomiting.    Serial Vital Signs/Assessments:  Time      Position  BP       Pulse  Resp  Temp     By 1:29 PM   Lying RA  168/102  65                    Payton Spark CMA 1:29 PM   Sitting   168/102  62                    Payton Spark CMA 1:29 PM   Standing  156/104  67                    Payton Spark CMA   Primary Care Provider:  Seymour Bars DO  CC:  HA, nausea, and vomiting and chills x 4 days. unable to eat or drink w/out vomiting. Marland Kitchen  History of Present Illness: 65 yo WF presents for N/V that started on Friday.  She did not have abdominal pain but became nauseated.  Her nausea seems to come and go.  She has not been hungry.  She is not sure if this is worse after eating.  She is drinking only a small amt of tea.  She has not had any diarrhea.  She has some epigastric burning and has lower abdominal cramps.  She had been constipated before this all started.  She is not feeling bloated.  She is vomitting about 2 x a day.  She has HAs, chills but no fever.  Her only abd surgery was gall bladder.  She has hx of PUD, off PPIs with some dyspepsia and belching.  Has not had an EGD in years.  Hx of DM but no hx of gastroparesis.      Allergies (verified): No Known Drug Allergies  Past History:  Past Medical History: Reviewed history from 04/02/2009 and no changes required. 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) TOBACCO DEPENDENCE (ICD-305.1) HYPOTHYROIDISM, UNSPECIFIED (ICD-244.9) GASTRIC ULCER ACUTE WITHOUT HEMORRHAGE (ICD-533.30) DIABETES MELLITUS II, UNCOMPLICATED (ICD-250.00) DEPRESSION, MAJOR, RECURRENT (ICD-296.30) OBESITY (ICD-278.00)  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 Cholecystectomy  R TKR This is a left L5 dorsal ramus injection, left L4 medial branch block,  and bilateral L3 medial branch block under fluoroscopic guidance.  .Fibroid tumor removal and knee  replacement.   Social History: Reviewed history from 10/09/2008 and no changes required. Recently moved here from [place].  Widowed- husband died of ALS.  Out of work due to back and leg pain.  Lives alone, but watches grandkids.  No insurance.  Smokes 1/2ppd. - trying to quit.  Wants to start water aerobics.  Depressed mood since loss of husband. Uses cane.    Review of Systems GI:  Complains of abdominal pain, constipation, indigestion, loss of appetite, nausea, and vomiting; denies bloody stools, dark tarry stools, and diarrhea.  Physical Exam  General:  alert, well-developed, well-nourished, and well-hydrated.  obese.  using walker Head:  normocephalic and atraumatic.   Eyes:  sclera non icteric Nose:  no nasal discharge.   Mouth:  pharynx pink and moist.   Neck:  no masses.   Lungs:  Normal respiratory  effort, chest expands symmetrically. Lungs are clear to auscultation, no crackles or wheezes. Heart:  Normal rate and regular rhythm. S1 and S2 normal without gallop, murmur, click, rub or other extra sounds. Abdomen:  soft.  mildly distended with stool.  no epigastric TTP.   no HSM.   Extremities:  no LE edema Skin:  color normal.  no pallor or jaundice Cervical Nodes:  No lymphadenopathy noted Psych:  good eye contact, not anxious appearing, and not depressed appearing.     Impression & Recommendations:  Problem # 1:  NAUSEA WITH VOMITING (ICD-787.01) 4 days of N/V without diarrhea or clinical dehydration.  Hold Metformin during illness.  DDX includes pancreatitis, diabetic gastroparesis, obstipation, gastroenteritis, gastrirts.  Get labs and AAS today.  F/U results tomorrow.  Clear liquid diet.  Compazine as needed nausea.  Empirically start on Dexilant samples 1 per day.  RTC in 1 wk for f/u.  May need upper GI with SBFT to r/u gastroparesis or EGD to r/o ulcers.   Orders: T-DG ABD Acute w/Chest ([id number]) T-CBC w/Diff ([id number]) T-Comprehensive Metabolic Panel [id number]) T-Amylase [id number]) T-Lipase [id number]) Prescription Created Electronically [id number])  Complete Medication List: 1)  Aspirin 81 Mg Tbec (Aspirin) .Marland Kitchen.. 1 tab by mouth qd 2)  Lisinopril 20 Mg Tabs (Lisinopril) .Marland Kitchen.. 1 tab by mouth bid 3)  Nitrostat 0.4 Mg Subl (Nitroglycerin) .... Dissolve one tablet sublingual every 5 minutes as needed chest pain. max dose 3tabs in 15 minutes 4)  Levothyroxine Sodium 150 Mcg Tabs (Levothyroxine sodium) .Marland Kitchen.. 1 tab by mouth daily 5)  Gabapentin 100 Mg Caps (Gabapentin) .Marland Kitchen.. 1 capsule by mouth in the am and at noon.  2 capsules at night 6)  Tramadol Hcl 50 Mg Tabs (Tramadol hcl) .... 2 tabs by mouth tid 7)  Metoprolol Tartrate 50 Mg Tabs (Metoprolol tartrate) .Marland Kitchen.. 1 tab by mouth bid 8)  Simvastatin 20 Mg Tabs (Simvastatin) .Marland Kitchen.. 1 tab by mouth qhs 9)  Zolpidem Tartrate 10 Mg  Tabs (Zolpidem tartrate) .Marland Kitchen.. 1 tab by mouth at bedtime as needed sleep 10)  Glyburide 5 Mg Tabs (Glyburide) .Marland Kitchen.. 1 tab by mouth two times a day with food 11)  Fluoxetine Hcl 20 Mg Tabs (Fluoxetine hcl) .Marland Kitchen.. 1 tab by mouth daily 12)  Metformin Hcl 500 Mg Tabs (Metformin hcl) .Marland Kitchen.. 1 tab by mouth two times a day 13)  Chantix Starting Month Pak 0.5 Mg X 11 & 1 Mg X 42 Tabs (Varenicline tartrate) .... Take as directed 14)  Prochlorperazine Maleate 5 Mg Tabs (Prochlorperazine maleate) .Marland Kitchen.. 1 tab by mouth q 6 hrs as needed nausea  Patient Instructions: 1)  Xrays downstairs today.  2)  Labs today. 3)  Will call you w/ results tomorrow. 4)  Clear liquid diet.  Use prochlorperazine as needed for nausea. 5)  Return for f/u in 1 wk. Prescriptions: PROCHLORPERAZINE MALEATE 5 MG TABS (PROCHLORPERAZINE MALEATE) 1 tab by mouth q 6 hrs as needed nausea  #20 x 0   Entered and Authorized by:   Seymour Bars DO   Signed by:   Seymour Bars DO on 11/02/2009   Method used:   Electronically to        [facility] [phone]* (retail)       [address] [place], [place]  [address]       Ph: [phone]       Fax: [phone]   RxID:   [id number]

## 2010-09-06 NOTE — Assessment & Plan Note (Signed)
Summary: BP, CBG and BMP//mpm  Nurse Visit   Vital Signs:  Patient profile:   65 year old female Pulse rate:   57 / minute BP sitting:   139 / 78  (left arm) Cuff size:   regular  Vitals Entered By: Kathlene November (February 24, 2010 9:15 AM) CC: BP check, CBG and labs   Current Medications (verified): 1)  Aspirin 81 Mg Tbec (Aspirin) .Marland Kitchen.. 1 Tab By Mouth Qd 2)  Nitrostat 0.4 Mg Subl (Nitroglycerin) .... Dissolve One Tablet Sublingual Every 5 Minutes As Needed Chest Pain. Max Dose 3tabs in 15 Minutes 3)  Levothyroxine Sodium 100 Mcg Tabs (Levothyroxine Sodium) .Marland Kitchen.. 1 Tab By Mouth Daily 4)  Gabapentin 100 Mg Caps (Gabapentin) .Marland Kitchen.. 1 Capsule By Mouth in The Am and At [facility]  2 Capsules At Night 5)  Tramadol Hcl 50 Mg Tabs (Tramadol Hcl) .... 2 Tabs By Mouth Tid 6)  Metoprolol Tartrate 100 Mg Tabs (Metoprolol Tartrate) .Marland Kitchen.. 1 Tab By Mouth Bid 7)  Simvastatin 20 Mg Tabs (Simvastatin) .Marland Kitchen.. 1 Tab By Mouth Qhs 8)  Zolpidem Tartrate 10 Mg Tabs (Zolpidem Tartrate) .Marland Kitchen.. 1 Tab By Mouth At Bedtime As Needed Sleep 9)  Glyburide 5 Mg Tabs (Glyburide) .Marland Kitchen.. 1 Tab By Mouth Two Times A Day With Food 10)  Dexilant 60 Mg Cpdr (Dexlansoprazole) .... Take 1 Tab By Mouth Once Daily 11)  Acyclovir 800 Mg Tabs (Acyclovir) .Marland Kitchen.. 1 Tab By Mouth 5 X A Day X 7 Days  Allergies (verified): No Known Drug Allergies Laboratory Results   Blood Tests   Date/Time Received: 02/24/2010 Date/Time Reported: 02/24/2010  Glucose (fasting): 160 mg/dL   (Normal Range: 36-644)     Orders Added: 1)  T-Basic Metabolic Panel [[id number]] 2)  Fingerstick [36416] 3)  Glucose, (CBG) [82962] 4)  Est. Patient Level I [03474]    Patient Instructions: 1)  BP finally came back up. 2)  Labs today. 3)  Will call you w/ results tomororrow.  If kidney function is not perfect, will keep you OFF the metformin.  The next step will be long acting insulin.  Continue using sliding scale for now. 4)  Return for f/u BP/ sugars in 3 wks.

## 2010-09-06 NOTE — Letter (Signed)
Summary: [facility]  [facility]   Imported By: Lanelle Bal 05/09/2010 14:20:14  _____________________________________________________________________  External Attachment:    Type:   Image     Comment:   External Document

## 2010-09-06 NOTE — Progress Notes (Signed)
Summary: Combo Rx too expensive  Phone Note Call from Patient   Caller: Patient Summary of Call: Pt is unable to afford glyburide/metformin combo so she needs them sent seperatly to pharm. Please advise.  Initial call taken by: Payton Spark CMA,  August 09, 2009 12:24 PM    New/Updated Medications: GLYBURIDE 5 MG TABS (GLYBURIDE) 1 tab by mouth two times a day with food METFORMIN HCL 500 MG TABS (METFORMIN HCL) 1 tab by mouth two times a day Prescriptions: METFORMIN HCL 500 MG TABS (METFORMIN HCL) 1 tab by mouth two times a day  #60 x 3   Entered and Authorized by:   Seymour Bars DO   Signed by:   Seymour Bars DO on 08/09/2009   Method used:   Electronically to        [facility] [phone]* (retail)       [address] [place], [place]  [address]       Ph: [phone]       Fax: [phone]   RxID:   [id number] GLYBURIDE 5 MG TABS (GLYBURIDE) 1 tab by mouth two times a day with food  #60 x 3   Entered and Authorized by:   Seymour Bars DO   Signed by:   Seymour Bars DO on 08/09/2009   Method used:   Electronically to        [facility] [phone]* (retail)       [address] [place], [place]  [address]       Ph: [phone]       Fax: [phone]   RxID:   [id number]

## 2010-09-06 NOTE — Assessment & Plan Note (Signed)
Summary: f/u gastritis   Vital Signs:  Patient profile:   65 year old female Height:      67 inches Weight:      213 pounds BMI:     33.48 O2 Sat:      96 % on Room air Pulse rate:   94 / minute BP sitting:   196 / 114  (left arm) Cuff size:   large  Vitals Entered By: Payton Spark CMA (November 09, 2009 9:59 AM)  O2 Flow:  Room air CC: F/U   Primary Care Provider:  Seymour Bars DO  CC:  F/U.  History of Present Illness: 86 yr WF presents for f/u from office visit 3/29, seen then for N/V. Today, she feels better. Denies N/V x1week, no abdominal pain. No diarrhea/constipation. Able to tolerate solid foods. Last took compazine last week.  She started Dexilant last week and now feels no reflux.   She is not on any NSAIDs.    BP 196/114 up from 156/104 on 3/29. Periodically checks BP at home, usually 140s/70s.  Denies HA, dizziness, CP, SOB. Some SOB w/ heavy exertion (25 min walk). She is taking metoprolol and lisinopril.   C/o pressure in lower abdomen with difficulty urinating and starting stream. She has a history of neurogenic bladder.   Started Chantix. Smokes less than 1/2 pk/day.   Home fasting blood sugar 102.   Current Medications (verified): 1)  Aspirin 81 Mg Tbec (Aspirin) .Marland Kitchen.. 1 Tab By Mouth Qd 2)  Lisinopril 20 Mg Tabs (Lisinopril) .Marland Kitchen.. 1 Tab By Mouth Bid 3)  Nitrostat 0.4 Mg Subl (Nitroglycerin) .... Dissolve One Tablet Sublingual Every 5 Minutes As Needed Chest Pain. Max Dose 3tabs in 15 Minutes 4)  Levothyroxine Sodium 150 Mcg Tabs (Levothyroxine Sodium) .Marland Kitchen.. 1 Tab By Mouth Daily 5)  Gabapentin 100 Mg Caps (Gabapentin) .Marland Kitchen.. 1 Capsule By Mouth in [facility] and At [facility].  2 Capsules At Night 6)  Tramadol Hcl 50 Mg Tabs (Tramadol Hcl) .... 2 Tabs By Mouth Tid 7)  Metoprolol Tartrate 50 Mg Tabs (Metoprolol Tartrate) .Marland Kitchen.. 1 Tab By Mouth Bid 8)  Simvastatin 20 Mg Tabs (Simvastatin) .Marland Kitchen.. 1 Tab By Mouth Qhs 9)  Zolpidem Tartrate 10 Mg Tabs (Zolpidem Tartrate) .Marland Kitchen.. 1 Tab  By Mouth At Bedtime As Needed Sleep 10)  Glyburide 5 Mg Tabs (Glyburide) .Marland Kitchen.. 1 Tab By Mouth Two Times A Day With Food 11)  Fluoxetine Hcl 20 Mg Tabs (Fluoxetine Hcl) .Marland Kitchen.. 1 Tab By Mouth Daily 12)  Metformin Hcl 500 Mg Tabs (Metformin Hcl) .Marland Kitchen.. 1 Tab By Mouth Two Times A Day 13)  Chantix Starting Month Pak 0.5 Mg X 11 & 1 Mg X 42 Tabs (Varenicline Tartrate) .... Take As Directed  Allergies (verified): No Known Drug Allergies  Past History:  Past Medical History: Reviewed history from 04/02/2009 and no changes required. 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) TOBACCO DEPENDENCE (ICD-305.1) HYPOTHYROIDISM, UNSPECIFIED (ICD-244.9) GASTRIC ULCER ACUTE WITHOUT HEMORRHAGE (ICD-533.30) DIABETES MELLITUS II, UNCOMPLICATED (ICD-250.00) DEPRESSION, MAJOR, RECURRENT (ICD-296.30) OBESITY (ICD-278.00)  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 Cholecystectomy  R TKR This is a left L5 dorsal ramus injection, left L4 medial branch block,  and bilateral L3 medial branch block under fluoroscopic guidance.  .Fibroid tumor removal and knee replacement.   Family History: Reviewed history from 07/28/2008 and no changes required. 2 kids- healthy  Father DIED at 4,  AMI @ 18; HTN, NIDDM, LUNG CANCER Mother died of stroke in 32s brother ?  Social History: Reviewed history from 10/09/2008 and no changes required. Recently moved here from [place].  Widowed- husband died of ALS.  Out of work due to back and leg pain.  Lives alone, but watches grandkids.  No insurance.  Smokes 1/2ppd. - trying to quit.  Wants to start water aerobics.  Depressed mood since loss of husband. Uses cane.    Review of Systems      See HPI  Physical Exam  General:  alert, well-developed, well-nourished, and well-hydrated.  Uses rolling walker.  Head:  normocephalic, atraumatic, and no abnormalities observed.   Eyes:  sclera non icteric Mouth:  pharynx pink and moist.   Neck:  supple and full ROM.   Lungs:  normal respiratory effort.  Lungs CTA. prolonged exp phase Heart:  normal rate and regular rhythm.   Abdomen:  soft, non-tender, and normal bowel sounds.  no epigastric TTP.  No HSM.   Pulses:  R radial normal and L radial normal.   Extremities:  1+ non pitting LE edema Neurologic:  resting bilat hand tremor Skin:  color normal, no rashes, and no edema.   Psych:  normally interactive, good eye contact, not anxious appearing, and not depressed appearing.     Impression & Recommendations:  Problem # 1:  ACUTE GASTRITIS WITHOUT MENTION OF HEMORRHAGE (ICD-535.00) N/V x 4 days felt to be acute gastritits.  Acute Abd series and labs all normal.  Improved with Dexilant.  Continue reflux precautions (no NSAIDs or ETOH).  Complete 2 more wks of Dexilant and then change to Pepcid AC 2 x a day.  Problem # 2:  HYPERTENSION, BENIGN SYSTEMIC (ICD-401.1) Assessment: Deteriorated  Increase metoprolol dose to 100 mg 2 x a day for high BP.  Return in 3 weeks for nurse visit-BP check.  Her updated medication list for this problem includes:    Lisinopril 20 Mg Tabs (Lisinopril) .Marland Kitchen... 1 tab by mouth bid    Metoprolol Tartrate 100 Mg Tabs (Metoprolol tartrate) .Marland Kitchen... 1 tab by  mouth bid  BP today: 196/114 Prior BP: 156/104 (11/02/2009)  Labs Reviewed: K+: 4.5 (11/02/2009) Creat: : 0.96 (11/02/2009)   Chol: 135 (07/16/2009)   HDL: 46 (07/16/2009)   LDL: 53 (07/16/2009)   TG: 179 (07/16/2009)  Problem # 3:  NEUROGENIC BLADDER (RJJ-884.16) Referral to urology. Pt has not had a w/u for this since living in [place] and is c/o retained urine/ fluid. Orders: Urology Referral (Urology)  Problem # 4:  TOBACCO DEPENDENCE (ICD-305.1) Down to <1/2 pk/day.  Doing better with Chantix.  Continue. Her updated medication list for this problem includes:    Chantix Starting Month Pak 0.5 Mg X 11 & 1 Mg X 42 Tabs (Varenicline tartrate) .Marland Kitchen... Take as directed  Problem # 5:  DIABETES MELLITUS II, UNCOMPLICATED (ICD-250.00) Home fasting sugar 102.  Continue current medications.  Labs UTD.  Her updated medication list for this problem includes:    Aspirin 81 Mg Tbec (Aspirin) .Marland Kitchen... 1 tab by mouth qd  Lisinopril 20 Mg Tabs (Lisinopril) .Marland Kitchen... 1 tab by mouth bid    Glyburide 5 Mg Tabs (Glyburide) .Marland Kitchen... 1 tab by mouth two times a day with food    Metformin Hcl 500 Mg Tabs (Metformin hcl) .Marland Kitchen... 1 tab by mouth two times a day  Complete Medication List: 1)  Aspirin 81 Mg Tbec (Aspirin) .Marland Kitchen.. 1 tab by mouth qd 2)  Lisinopril 20 Mg Tabs (Lisinopril) .Marland Kitchen.. 1 tab by mouth bid 3)  Nitrostat 0.4 Mg Subl (Nitroglycerin) .... Dissolve one tablet sublingual every 5 minutes as needed chest pain. max dose 3tabs in 15 minutes 4)  Levothyroxine Sodium 150 Mcg Tabs (Levothyroxine sodium) .Marland Kitchen.. 1 tab by mouth daily 5)  Gabapentin 100 Mg Caps (Gabapentin) .Marland Kitchen.. 1 capsule by mouth in the am and at noon.  2 capsules at night 6)  Tramadol Hcl 50 Mg Tabs (Tramadol hcl) .... 2 tabs by mouth tid 7)  Metoprolol Tartrate 100 Mg Tabs (Metoprolol tartrate) .Marland Kitchen.. 1 tab by mouth bid 8)  Simvastatin 20 Mg Tabs (Simvastatin) .Marland Kitchen.. 1 tab by mouth qhs 9)  Zolpidem Tartrate 10 Mg Tabs (Zolpidem tartrate) .Marland Kitchen.. 1 tab by  mouth at bedtime as needed sleep 10)  Glyburide 5 Mg Tabs (Glyburide) .Marland Kitchen.. 1 tab by mouth two times a day with food 11)  Fluoxetine Hcl 20 Mg Tabs (Fluoxetine hcl) .Marland Kitchen.. 1 tab by mouth daily 12)  Metformin Hcl 500 Mg Tabs (Metformin hcl) .Marland Kitchen.. 1 tab by mouth two times a day 13)  Chantix Starting Month Pak 0.5 Mg X 11 & 1 Mg X 42 Tabs (Varenicline tartrate) .... Take as directed  Patient Instructions: 1)  Stay on Dexilant samples - 1 a day until they run out. 2)  Once out, start on Pepcid AC 1 tab 2 x a day for acid reflux. 3)  Increase metoprolol dose to 100 mg 2 x a day for high BP.  4)  Will set you up for urology visit. 5)  Return for a nurse BP check in 3 wks.   Prescriptions: LEVOTHYROXINE SODIUM 150 MCG TABS (LEVOTHYROXINE SODIUM) 1 tab by mouth daily  #30 x 2   Entered and Authorized by:   Seymour Bars DO   Signed by:   Seymour Bars DO on 11/09/2009   Method used:   Electronically to        [facility] [phone]* (retail)       [address] [place], [place]  [address]       Ph: [phone]       Fax: [phone]   RxID:   [id number] METOPROLOL TARTRATE 100 MG TABS (METOPROLOL TARTRATE) 1 tab by mouth bid  #60 x 3   Entered and Authorized by:   Seymour Bars DO   Signed by:   Seymour Bars DO on 11/09/2009   Method used:   Electronically to        [facility] [phone]* (retail)       [address] [place], [place]  [address]       Ph: [phone]       Fax: [phone]   RxID:   [id number]

## 2010-09-06 NOTE — Consult Note (Signed)
Summary: [facility]  [facility]   Imported By: Lanelle Bal 12/16/2009 12:43:15  _____________________________________________________________________  External Attachment:    Type:   Image     Comment:   External Document

## 2010-09-06 NOTE — Progress Notes (Signed)
Summary: Chantix Rx  Phone Note Call from Patient   Caller: Patient Summary of Call: Is it OK for Pt to start Chantix? Initial call taken by: Payton Spark CMA,  October 21, 2009 10:40 AM  Follow-up for Phone Call        yes, does she need RX? Follow-up by: Seymour Bars DO,  October 21, 2009 10:48 AM  Additional Follow-up for Phone Call Additional follow up Details #1::        Yes. Please  Additional Follow-up by: Payton Spark CMA,  October 21, 2009 10:51 AM    New/Updated Medications: CHANTIX STARTING MONTH PAK 0.5 MG X 11 & 1 MG X 42 TABS (VARENICLINE TARTRATE) take as directed Prescriptions: CHANTIX STARTING MONTH PAK 0.5 MG X 11 & 1 MG X 42 TABS (VARENICLINE TARTRATE) take as directed  #1 pack x 0   Entered and Authorized by:   Seymour Bars DO   Signed by:   Seymour Bars DO on 10/21/2009   Method used:   Printed then faxed to ...       [address] [phone]* (retail)       [address] [place], [place]  [address]       Ph: [phone]       Fax: [phone]   RxID:   [id number]   Appended Document: Chantix Rx Called in

## 2010-09-06 NOTE — Assessment & Plan Note (Signed)
Summary: N/V   Vital Signs:  Patient profile:   65 year old female Height:      67 inches Weight:      213 pounds BMI:     33.48 O2 Sat:      97 % on Room air Temp:     98.2 degrees F oral Pulse rate:   57 / minute BP sitting:   193 / 87  (left arm) Cuff size:   large  Vitals Entered By: Payton Spark CMA (March 25, 2010 2:25 PM)  O2 Flow:  Room air CC: Still having problems w/ hearing, unbalanced and lightheaded in the morning and fasting sugars elevated between 160-180 AM fasting.    Primary Care Palmer [facility]y:  Seymour Bars DO  CC:  Still having problems w/ hearing and unbalanced and lightheaded in the morning and fasting sugars elevated between 160-180 AM fasting. Marland Kitchen  History of Present Illness: Today the patient complains of decreased hearing in the left ear and it does not go away and it seems louder at night.  She also reports that she feels lightheaded and dizzy for approxiametaly for one hour after she wakes up and it usually goes away on its own and does not have to take anything to make it better.   In addition, she reports feeling "queezy" the last couple of days, but yesterday was the only day she actually vomitted.  Today, the patient reports major improvements in those symptoms.  She denies any abdominal pain after meals, diarrhea,  and constipation.  Lastly, she still is having difficulty with nasal draiange and swelling without improvement with Nasanex.     In addition, the patient reports that she is ready to consider insulin for better control of her Diabetes because she is fustrated that she is unable to control her Diabetes currently.  She reports that her Glucose readings average from 155 to 205.    Current Medications (verified): 1)  Aspirin 81 Mg Tbec (Aspirin) .Marland Kitchen.. 1 Tab By Mouth Qd 2)  Nitrostat 0.4 Mg Subl (Nitroglycerin) .... Dissolve One Tablet Sublingual Every 5 Minutes As Needed Chest Pain. Max Dose 3tabs in 15 Minutes 3)  Levothyroxine Sodium 100 Mcg Tabs  (Levothyroxine Sodium) .Marland Kitchen.. 1 Tab By Mouth Daily 4)  Gabapentin 100 Mg Caps (Gabapentin) .Marland Kitchen.. 1 Capsule By Mouth in The Am and At [facility].  2 Capsules At Night 5)  Tramadol Hcl 50 Mg Tabs (Tramadol Hcl) .... 2 Tabs By Mouth Tid 6)  Metoprolol Tartrate 100 Mg Tabs (Metoprolol Tartrate) .Marland Kitchen.. 1 Tab By Mouth Bid 7)  Simvastatin 20 Mg Tabs (Simvastatin) .Marland Kitchen.. 1 Tab By Mouth Qhs 8)  Zolpidem Tartrate 10 Mg Tabs (Zolpidem Tartrate) .Marland Kitchen.. 1 Tab By Mouth At Bedtime As Needed Sleep 9)  Glyburide 5 Mg Tabs (Glyburide) .Marland Kitchen.. 1 Tab By Mouth Two Times A Day With Meals 10)  Dexilant 60 Mg Cpdr (Dexlansoprazole) .... Take 1 Tab By Mouth Once Daily 11)  Acyclovir 800 Mg Tabs (Acyclovir) .Marland Kitchen.. 1 Tab By Mouth 5 X A Day X 7 Days 12)  Furosemide 40 Mg Tabs (Furosemide) .Marland Kitchen.. 1 Tab By Mouth Every Other Day 13)  Metformin Hcl 500 Mg Tabs (Metformin Hcl) .... Take 1 Tab By Mouth Two Times A Day  Allergies (verified): No Known Drug Allergies  Past History:  Past Medical History: Reviewed history from 03/09/2010 and no changes required. 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) TOBACCO DEPENDENCE (ICD-305.1) HYPOTHYROIDISM, UNSPECIFIED (ICD-244.9) GASTRIC ULCER ACUTE WITHOUT HEMORRHAGE (ICD-533.30) DIABETES MELLITUS II, UNCOMPLICATED (ICD-250.00) DEPRESSION, MAJOR, RECURRENT (ICD-296.30) OBESITY (ICD-278.00)  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 Recently moved here from [place].  Widowed- husband died of ALS.  Out of work due to back and leg pain.  Lives alone, but watches grandkids.  No insurance.  Smokes 1/2ppd. - trying to quit.  Wants to start water aerobics.  Depressed mood since loss of husband. Uses cane.    Review of Systems      See HPI  Physical Exam  General:  alert, well-developed, and well-nourished.   Eyes:  sclera non icteric; no nystagmus Ears:  exudate filling the L EAC, tender Nose:  no rhinorrhea Mouth:  pharynx pink and moist.   Neck:  no masses.   Heart:  normal rate, regular rhythm, no murmur, no gallop, and no rub.   Abdomen:  soft, non-tender, normal bowel sounds, and no hepatomegaly.   Pulses:  Pulses 2+ bilaterally in the lower extremities.  Extremities:  No edema present in the lower extremties. Neurologic:  cranial nerves II-XII intact.   Skin:  no jaundice or pallor Cervical Nodes:  No lymphadenopathy noted Psych:  normally interactive, good eye contact, and not anxious appearing.    Diabetes Management Exam:    Foot Exam (with socks and/or shoes not present):       Sensory-Monofilament:          Left foot: absent          Right foot: diminished       Sensory-other: Vibration sensation absent up to the knees bilaterally.         Inspection:          Left foot: normal          Right foot: normal       Nails:          Left foot: thickened          Right foot: normal   Impression & Recommendations:  Problem # 1:  NAUSEA AND VOMITING (ICD-787.01) Given her hx, this sounds to be related to eustachian tube dysfunction from early morning congestion.  Unable to see her L TM due to AOE. Given the abscnece of abd pain and only occuring in the AM, unliklel to be gastroparesis, intestinal ischemia, PUD, obstruction or gastrenteritis.   Orders: T-Lipase ([id number])  Problem # 2:   OTITIS EXTERNA (ICD-380.10)  Not improved after RX drops, likely due to the degree of debrit in her canal or this may be fungal.  I am going to get her into ENT for further eval and tx. The following medications were removed from the medication list:    Acetic Acid 2 % Soln (Acetic acid) .Marland KitchenMarland KitchenMarland KitchenMarland Kitchen 4 drops in the left ear three times a day x 10 days  Orders: ENT Referral (ENT)  Problem # 3:  DIABETES MELLITUS II, UNCOMPLICATED (ICD-250.00) Poor control of sugars.  She has Novolog to use for SSI at home with a guide on how to do this.  Will need to make sure her renal function is  OK to stay on Metformin.   Her updated medication list for this problem includes:    Aspirin 81 Mg Tbec (Aspirin) .Marland Kitchen... 1 tab by mouth qd    Glyburide 5 Mg Tabs (Glyburide) .Marland Kitchen... 1 tab by mouth two times a day with meals    Metformin Hcl 500 Mg Tabs (Metformin hcl) .Marland Kitchen... Take 1 tab by mouth two times a day  Problem # 4:  RENAL INSUFFICIENCY (ICD-588.9) Recheck BMP today given recent findings of RAS on CT scan thru [facility].  R/O uremia as cause for N/V.   Orders: T-Basic Metabolic Panel [id number])  Complete Medication List: 1)  Aspirin 81 Mg Tbec (Aspirin) .Marland Kitchen.. 1 tab by mouth qd 2)  Nitrostat 0.4 Mg Subl (Nitroglycerin) .... Dissolve one tablet sublingual every 5 minutes as needed chest pain. max dose 3tabs in 15 minutes 3)  Levothyroxine Sodium 100 Mcg Tabs (Levothyroxine sodium) .Marland Kitchen.. 1 tab by mouth daily 4)  Gabapentin 100 Mg Caps (Gabapentin) .Marland Kitchen.. 1 capsule by mouth in the am and at noon.  2 capsules at night 5)  Tramadol Hcl 50 Mg Tabs (Tramadol hcl) .... 2 tabs by mouth tid 6)  Metoprolol Tartrate 100 Mg Tabs (Metoprolol tartrate) .Marland Kitchen.. 1 tab by mouth bid 7)  Simvastatin 20 Mg Tabs (Simvastatin) .Marland Kitchen.. 1 tab by mouth qhs 8)  Zolpidem Tartrate 10 Mg Tabs (Zolpidem tartrate) .Marland Kitchen.. 1 tab by mouth at bedtime as needed sleep 9)  Glyburide 5 Mg Tabs (Glyburide) .Marland Kitchen.. 1 tab by mouth two times a day with  meals 10)  Dexilant 60 Mg Cpdr (Dexlansoprazole) .... Take 1 tab by mouth once daily 11)  Acyclovir 800 Mg Tabs (Acyclovir) .Marland Kitchen.. 1 tab by mouth 5 x a day x 7 days 12)  Furosemide 40 Mg Tabs (Furosemide) .Marland Kitchen.. 1 tab by mouth every other day 13)  Metformin Hcl 500 Mg Tabs (Metformin hcl) .... Take 1 tab by mouth two times a day  Patient Instructions: 1)  Will get you into ENT. 2)  Labs today. 3)  Will call you w/ results tomorrow.

## 2010-09-06 NOTE — Progress Notes (Signed)
Summary: Dexilant Rx       New/Updated Medications: DEXILANT 60 MG CPDR (DEXLANSOPRAZOLE) Take 1 tab by mouth once daily Prescriptions: DEXILANT 60 MG CPDR (DEXLANSOPRAZOLE) Take 1 tab by mouth once daily  #30 x 2   Entered by:   Payton Spark CMA   Authorized by:   Seymour Bars DO   Signed by:   Payton Spark CMA on 01/13/2010   Method used:   Electronically to        [facility] [phone]* (retail)       [address] [place], [place]  [address]       Ph: [phone]       Fax: [phone]   RxID:   [id number]  Pt called back requesting Rx for Dexilant bc Pepcid is not working well.

## 2010-09-06 NOTE — Progress Notes (Signed)
Summary: Ear drops too expensive  Phone Note Call from Patient   Caller: Patient Summary of Call: Pt calls stating that the ear drops are too expensive. Pt would like something cheaper sent to pharm. Please advise. Initial call taken by: Payton Spark CMA,  March 11, 2010 10:02 AM    New/Updated Medications: ACETIC ACID 2 % SOLN (ACETIC ACID) 4 drops in the left ear three times a day x 10 days Prescriptions: ACETIC ACID 2 % SOLN (ACETIC ACID) 4 drops in the left ear three times a day x 10 days  #1 bottle x 0   Entered and Authorized by:   Seymour Bars DO   Signed by:   Seymour Bars DO on 03/11/2010   Method used:   Electronically to        [facility] [phone]* (retail)       [address] [place], [place]  [address]       Ph: [phone]       Fax: [phone]   RxID:   [id number]   Appended Document: Ear drops too expensive Pt aware of the above

## 2010-09-06 NOTE — Assessment & Plan Note (Signed)
Summary: BP CHECK  Nurse Visit   Vital Signs:  Patient profile:   65 year old female O2 Sat:      96 % on Room air Pulse rate:   55 / minute BP sitting:   176 / 92  (left arm) Cuff size:   large  Vitals Entered By: Payton Spark CMA (November 30, 2009 9:23 AM)  O2 Flow:  Room air CC: Pt states she is feeling better but admits to having increased stress due to son's declining health.    Allergies: No Known Drug Allergies  Orders Added: 1)  Est. Patient Level I [16109] Prescriptions: CHLORTHALIDONE 25 MG TABS (CHLORTHALIDONE) 1 tab by mouth daily  #30 x 1   Entered and Authorized by:   Seymour Bars DO   Signed by:   Seymour Bars DO on 11/30/2009   Method used:   Electronically to        [facility] [phone]* (retail)       [address] [place], [place]  [address]       Ph: [phone]       Fax: [phone]   RxID:   [id number]    CC: Follow-up HTN HPI: Taking meds? yes Side effects? no Chest pain, SOB, Dizziness? no  A/P: HTN (401.1) At goal? If no, Patient will be notified.  5 minutes was spent with patient.     Impression & Recommendations:  Problem # 1:  HYPERTENSION, BENIGN SYSTEMIC (ICD-401.1) BP remains HIGH.  Add Chlorthalidone once a day.  RTC to recheck BP and a BMP in 6 wks. Her updated medication list for this problem includes:    Lisinopril 20 Mg Tabs (Lisinopril) .Marland Kitchen... 1 tab by mouth bid    Metoprolol Tartrate 100 Mg Tabs (Metoprolol tartrate) .Marland Kitchen... 1 tab by mouth bid    Chlorthalidone 25 Mg Tabs (Chlorthalidone) .Marland Kitchen... 1 tab by mouth daily    BP today: 176/92 Prior BP: 196/114 (11/09/2009)  Labs Reviewed: K+: 4.5 (11/02/2009) Creat: : 0.96 (11/02/2009)   Chol: 135 (07/16/2009)   HDL: 46 (07/16/2009)   LDL: 53 (07/16/2009)   TG: 179 (07/16/2009)  Complete Medication List: 1)  Aspirin 81 Mg Tbec (Aspirin) .Marland Kitchen.. 1 tab by mouth qd 2)  Lisinopril 20 Mg Tabs (Lisinopril) .Marland Kitchen.. 1 tab by mouth bid 3)  Nitrostat 0.4 Mg Subl  (Nitroglycerin) .... Dissolve one tablet sublingual every 5 minutes as needed chest pain. max dose 3tabs in 15 minutes 4)  Levothyroxine Sodium 150 Mcg Tabs (Levothyroxine sodium) .Marland Kitchen.. 1 tab by mouth daily 5)  Gabapentin 100 Mg Caps (Gabapentin) .Marland Kitchen.. 1 capsule by mouth in the am and at noon.  2 capsules at night 6)  Tramadol Hcl 50 Mg Tabs (Tramadol hcl) .... 2 tabs by mouth tid 7)  Metoprolol Tartrate 100 Mg Tabs (Metoprolol tartrate) .Marland Kitchen.. 1 tab by mouth bid 8)  Simvastatin 20 Mg Tabs (Simvastatin) .Marland Kitchen.. 1 tab by mouth qhs 9)  Zolpidem Tartrate 10 Mg Tabs (Zolpidem tartrate) .Marland Kitchen.. 1 tab by mouth at bedtime as needed sleep 10)  Glyburide 5 Mg Tabs (Glyburide) .Marland Kitchen.. 1 tab by mouth two times a day with food 11)  Fluoxetine Hcl 20 Mg Tabs (Fluoxetine hcl) .Marland Kitchen.. 1 tab by mouth daily 12)  Metformin Hcl 500 Mg Tabs (Metformin hcl) .Marland Kitchen.. 1 tab by mouth two times a day 13)  Chantix Continuing Month Pak 1 Mg Tabs (Varenicline tartrate) .... Take as directed 14)  Chlorthalidone 25 Mg Tabs (  Chlorthalidone) .Marland Kitchen.. 1 tab by mouth daily   Appended Document: BP CHECK pt aware

## 2010-09-06 NOTE — Miscellaneous (Signed)
Summary: no retinopathy  Clinical Lists Changes  Observations: Added new observation of DIAB EYE EX: no retinopathy (Dr Weston Leilany) (12/28/2009 13:04)

## 2010-09-06 NOTE — Progress Notes (Signed)
Summary: Mail order refills       New/Updated Medications: * [facility] CONTOUR TEST STRIPS Use as directed to check blood sugar two times a day Dx:250.0 LANCETS  MISC (LANCETS) Use as directed to check blood sugar two times a day Dx:250.0 Prescriptions: LANCETS  MISC (LANCETS) Use as directed to check blood sugar two times a day Dx:250.0  #0 x 0   Entered and Authorized by:   Seymour Bars DO   Signed by:   Seymour Bars DO on 12/28/2009   Method used:   Printed then faxed to ...       [address] [phone]* (retail)       [address] [place], [place]  [address]       Ph: [phone]       Fax: [phone]   RxID:   [id number] [facility] CONTOUR TEST STRIPS Use as directed to check blood sugar two times a day Dx:250.0  #0 x 0   Entered and Authorized by:   Seymour Bars DO   Signed by:   Seymour Bars DO on 12/28/2009   Method used:   Printed then faxed to ...       [address] [phone]* (retail)       [address] [place], [place]  [address]       Ph: [phone]       Fax: [phone]   RxID:   [id number]   Appended Document: Mail order refills Prescriptions: LANCETS  MISC (LANCETS) Use as directed to check blood sugar two times a day Dx:250.0  #180 x 3   Entered by:   Payton Spark CMA   Authorized by:   Seymour Bars DO   Signed by:   Payton Spark CMA on 12/28/2009   Method used:   Printed then faxed to ...       [address] [phone]* (retail)       [address] [place], [place]  [address]       Ph: [phone]       Fax: [phone]   RxID:   [id number] [facility] CONTOUR TEST STRIPS Use as directed to check blood sugar two times a day Dx:250.0  #180 x 3   Entered by:   Payton Spark CMA   Authorized by:   Seymour Bars DO   Signed by:   Payton Spark CMA on 12/28/2009   Method used:   Printed then faxed to ...       [address] [phone]* (retail)       [address] [place], [place]  [address]       Ph: [phone]       Fax:  [phone]   RxID:   [id number]

## 2010-09-06 NOTE — Assessment & Plan Note (Signed)
Summary: AAA/ R common iliac aneurysm   Vital Signs:  Patient profile:   65 year old female Height:      67 inches Weight:      222 pounds BMI:     34.90 O2 Sat:      96 % on Room air Pulse rate:   56 / minute BP sitting:   146 / 86  (left arm) Cuff size:   large  Vitals Entered By: Payton Spark CMA (July 14, 2010 9:45 AM)  O2 Flow:  Room air CC: F/U scans from urology   Primary Care Provider:  Seymour Bars DO  CC:  F/U scans from urology.  History of Present Illness: 65 yo WF presents for f/u after seeing Dr Malen Gauze for (asymptomatic ) neurogenic bladder.  She had a CT of the abdomen and pelvis w/ contrast performed at [facility] on 06-28-10 that showed:  saccular aneurysms of the infrarenal aorta, measuring 2.9 cm and Her R common illiac aneurysm (which was measured at 2.4 cm on CT scan 03-17-2010) was now 2.7 cm.  Pt denies any abd pain, changes in leg pain or numbness ( has neuropathy).  She is not yet due for f/u with Dr Jens Som.  She does note being upset and tearful x 3 days after trying to mend fences with her brother.  She is resistant to taking anything for her mood but will think about it.  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) TOBACCO DEPENDENCE (ICD-305.1) HYPOTHYROIDISM, UNSPECIFIED (ICD-244.9) GASTRIC ULCER ACUTE WITHOUT HEMORRHAGE (ICD-533.30) DIABETES MELLITUS II, with neuropathy and neurogenic bladder DEPRESSION, MAJOR, RECURRENT (ICD-296.30) OBESITY (ICD-278.00)  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 Recently moved here from [place].  Widowed- husband died of ALS.  Out of work due to back and leg pain.  Lives alone, but watches grandkids.  No insurance.  Smokes 1/2ppd. - trying to quit.  Wants to start water aerobics.  Depressed mood since loss of husband. Uses cane.    Review of Systems      See HPI  Physical Exam  General:  alert, well-developed, well-nourished, and well-hydrated.  obese, using walker Psych:  good eye contact and depressed affect.     Impression & Recommendations:  Problem # 1:  AAA (ICD-441.4) Assessment New Findings on CT 06-28-10 by Dr Malen Gauze indicate infrarenal saccular aneurysms of the aorta, measuring 2.9 cm.   I will get pt to f/u with Dr Jens Som given this new finding and growth of her R common illiac aneurysm. Her risk factors including continued smoking, HTN, Hyperlipidemia, DM (last A1c 8.6), last LDL 53.    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  As per #1.  > 3.0 cm is usually time for intervention.  Problem # 3:  RENAL ARTERY STENOSIS (ICD-440.1) She had high grade L renal artery stenosis (ostial) on CT scan in Agu 2011. Will  need renal function monitored q 6 months. BPs are creeping up I will ask Dr Jens Som if it is time to intervene.  Problem # 4:  DEPRESSION, MAJOR, RECURRENT (ICD-296.30) Pt is resistent to meds at this point but I did ask her to call me if she changed her mind.  She has been talking to her brother's wife and is happy that she is taking the first steps.  Complete Medication List: 1)  Aspirin 81 Mg Tbec (Aspirin)  .Marland Kitchen.. 1 tab by mouth qd 2)  Nitrostat 0.4 Mg Subl (Nitroglycerin) .... Dissolve one tablet sublingual every 5 minutes as needed chest pain. max dose 3tabs in 15 minutes 3)  Levothyroxine Sodium 125 Mcg Tabs (Levothyroxine sodium) .Marland Kitchen.. 1 tab by mouth daily 4)  Gabapentin 300 Mg Caps (Gabapentin) .Marland Kitchen.. 1 capsule by mouth bid 5)  Tramadol Hcl 50 Mg Tabs (Tramadol hcl) .... 2 tabs by mouth tid 6)  Metoprolol Tartrate 100 Mg Tabs (Metoprolol tartrate) .Marland Kitchen.. 1 tab by mouth bid 7)  Simvastatin 20 Mg Tabs (Simvastatin) .Marland Kitchen.. 1 tab by mouth qhs 8)  Zolpidem Tartrate 10 Mg Tabs (Zolpidem tartrate) .Marland Kitchen.. 1 tab by mouth at bedtime as needed sleep 9)  Glyburide 5 Mg Tabs (Glyburide) .Marland Kitchen.. 1 tab by mouth two times a day with meals 10)  Dexilant 60 Mg Cpdr (Dexlansoprazole) .... Take 1 tab by mouth once daily 11)  Acyclovir 800 Mg Tabs (Acyclovir) .Marland Kitchen.. 1 tab by mouth 5 x a day x 7 days 12)  Furosemide 40 Mg Tabs (Furosemide) .Marland Kitchen.. 1 tab by mouth qam 13)  Metformin Hcl 500 Mg Xr24h-tab (Metformin hcl) .... 2 tabs by mouth once daily with dinner 14)  Bayer Contour Test Strp (Glucose blood) .... Use as directed to check blood sugar two times a day 15)  Lancets Misc (Lancets) .... Use as directed to check blood cugar two times a day 16)  Gradulated Compression Hose  .... Dx: dependent leg edema  Other Orders: T-Basic Metabolic Panel [id number])  Patient Instructions: 1)  BMP today. 2)  RX for gradulated compression hose given. Wear duing the day.  3)  Elevate legs while at rest. 4)  Will f/u the aneurysm on scan by urologist. 5)  In Aug, you had a stable 2.4 cm R common illiac artery aneurysm. 6)  Will call you w/ lab results tomorrow. Prescriptions: GRADULATED COMPRESSION HOSE dx: dependent leg edema  #2 pairs x 0   Entered and Authorized by:   Seymour Bars DO   Signed by:   Seymour Bars DO on 07/14/2010   Method used:   Print then Give to Patient   RxID:   [id number]    Orders Added: 1)  T-Basic  Metabolic Panel [[id number]] 2)  Est. Patient Level IV [41324]  Appended Document: AAA/ R common iliac aneurysm schedule vasc surg consult for iliac aneurysm  Appended Document: Orders Update pt aware of results Deliah Goody, RN  July 15, 2010 5:17 PM       Clinical Lists Changes  Orders: Added new Referral order of [facility] Referral ([facility] Ref) - Signed

## 2010-09-06 NOTE — Progress Notes (Signed)
Summary: Elevated sugar  Phone Note Call from Patient   Summary of Call: Pt calls stating her CBG was 203 so she decided not to use insulin. Pt then ate cereal for breakfast and later sugar was 350. Pt then injected 5 units of insulin and later sugar was 250. Pt calls to find out if this is OK and what she should do.   Dr. B advised Pt to not eat anymore carbs today and check sugar around dinner. Pt aware. Initial call taken by: Payton Spark CMA,  February 18, 2010 11:28 AM

## 2010-09-06 NOTE — Assessment & Plan Note (Signed)
Summary: sinusitis/ dizziness   Vital Signs:  Patient profile:   65 year old female Height:      67 inches Weight:      214 pounds BMI:     33.64 O2 Sat:      94 % on Room air Temp:     98.8 degrees F oral Pulse rate:   62 / minute BP sitting:   162 / 77  (right arm) Cuff size:   large  Vitals Entered By: Payton Spark CMA/April (September 21, 2009 1:34 PM)  O2 Flow:  Room air CC: c/o of dizziness and HA on and off for about a month   Primary Care Provider:  Seymour Bars DO  CC:  c/o of dizziness and HA on and off for about a month.  History of Present Illness: 65 yo WF presents for problems with dizziness that started 2 wks ago.  The room is spinning on and off but not reproducible  with certain neck ROM.  She has a lot of postnasal drip.  She has pain in the L ear.  Her L ear is ringing.  She has some chronic mild hearing loss, no changes.  She tried Tylenol sinus, Advil sinus, antihistamines, benadryl but nothing is helping.  She has a slight HA.  She has some nasal congestion.  Has no sore throat.  She has some nausea with dizziness but no vomitting.    Current Medications (verified): 1)  Aspirin 81 Mg Tbec (Aspirin) .Marland Kitchen.. 1 Tab By Mouth Qd 2)  Lisinopril 20 Mg Tabs (Lisinopril) .Marland Kitchen.. 1 Tab By Mouth Bid 3)  Nitrostat 0.4 Mg Subl (Nitroglycerin) .... Dissolve One Tablet Sublingual Every 5 Minutes As Needed Chest Pain. Max Dose 3tabs in 15 Minutes 4)  Levothyroxine Sodium 150 Mcg Tabs (Levothyroxine Sodium) .Marland Kitchen.. 1 Tab By Mouth Daily 5)  Gabapentin 100 Mg Caps (Gabapentin) .Marland Kitchen.. 1 Capsule By Mouth in The Am and At [facility].  2 Capsules At Night 6)  Tramadol Hcl 50 Mg Tabs (Tramadol Hcl) .... 2 Tabs By Mouth Tid 7)  Metoprolol Tartrate 50 Mg Tabs (Metoprolol Tartrate) .Marland Kitchen.. 1 Tab By Mouth Bid 8)  Simvastatin 20 Mg Tabs (Simvastatin) .Marland Kitchen.. 1 Tab By Mouth Qhs 9)  Zolpidem Tartrate 10 Mg Tabs (Zolpidem Tartrate) .Marland Kitchen.. 1 Tab By Mouth At Bedtime As Needed Sleep 10)  Glyburide 5 Mg Tabs  (Glyburide) .Marland Kitchen.. 1 Tab By Mouth Two Times A Day With Food 11)  Fluoxetine Hcl 20 Mg Tabs (Fluoxetine Hcl) .Marland Kitchen.. 1 Tab By Mouth Daily 12)  Metformin Hcl 500 Mg Tabs (Metformin Hcl) .Marland Kitchen.. 1 Tab By Mouth Two Times A Day  Allergies (verified): No Known Drug Allergies  Past History:  Past Medical History: Reviewed history from 04/02/2009 and no changes required. 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) TOBACCO DEPENDENCE (ICD-305.1) HYPOTHYROIDISM, UNSPECIFIED (ICD-244.9) GASTRIC ULCER ACUTE WITHOUT HEMORRHAGE (ICD-533.30) DIABETES MELLITUS II, UNCOMPLICATED (ICD-250.00) DEPRESSION, MAJOR, RECURRENT (ICD-296.30) OBESITY (ICD-278.00)  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 Cholecystectomy  R TKR  This is a left L5 dorsal ramus injection, left L4 medial branch block,  and bilateral L3 medial branch block under fluoroscopic guidance.  .Fibroid tumor removal and knee replacement.   Social History: Reviewed history from 10/09/2008 and no changes required. Recently moved here from [place].  Widowed- husband died of ALS.  Out of work due to back and leg pain.  Lives alone, but watches grandkids.  No insurance.  Smokes 1/2ppd. - trying to quit.  Wants to  start water aerobics.  Depressed mood since loss of husband. Uses cane.    Review of Systems      See HPI  Physical Exam  General:  alert, well-developed, well-nourished, well-hydrated, and overweight-appearing.   Head:  normocephalic and atraumatic.  sinuses NTTP Eyes:  no nystagmus horizontal or verticle, PERRLA Ears:  L EAC full of white exudate with EAC edeam and external redness/ scaling. R TM normal Nose:  no nasal discharge.   Mouth:  pharynx pink and moist.  throat w/o injection Neck:  no masses.   Lungs:  Normal respiratory effort, chest expands symmetrically. Lungs are clear to auscultation, no crackles or wheezes. Heart:  Normal rate and regular rhythm. S1 and S2 normal without gallop, murmur, click, rub or other extra sounds. Skin:  color normal.  no jaundice, pallor Cervical Nodes:  No lymphadenopathy noted Psych:  good eye contact, not anxious appearing, and not depressed appearing.     Impression & Recommendations:  Problem # 1:  ACUTE SINUSITIS, UNSPECIFIED (ICD-461.9) Start Amoxicillin and take for 10 days.   Her updated medication list for this problem includes:    Amoxicillin 500 Mg Caps (Amoxicillin) .Marland Kitchen... 1 tab by mouth three times a day x 10 days  Problem # 2:  LABYRINTHITIS, ACUTE (ICD-386.30) Likely the cause of her non reproducible vertigo along with head congestion and sinusitis. She can use non drowsy dramamine for remaining dizziness.  Problem # 3:  OTITIS EXTERNA, ACUTE (ICD-380.12) Treat with cortisporin drops.  Keep ear dry.  Problem # 4:  HYPERTENSION, BENIGN SYSTEMIC (ICD-401.1) BP high.  Stop OTC cold meds.   Her updated medication list for this problem includes:    Lisinopril 20 Mg Tabs (Lisinopril) .Marland Kitchen... 1 tab by mouth bid    Metoprolol Tartrate 50 Mg Tabs (Metoprolol tartrate) .Marland Kitchen... 1 tab by mouth bid  BP today: 162/77 Prior BP: 153/85 (07/05/2009)  Labs Reviewed: K+: 4.7 (07/16/2009) Creat: : 1.40 (07/16/2009)   Chol: 135  (07/16/2009)   HDL: 46 (07/16/2009)   LDL: 53 (07/16/2009)   TG: 179 (07/16/2009)  Complete Medication List: 1)  Aspirin 81 Mg Tbec (Aspirin) .Marland Kitchen.. 1 tab by mouth qd 2)  Lisinopril 20 Mg Tabs (Lisinopril) .Marland Kitchen.. 1 tab by mouth bid 3)  Nitrostat 0.4 Mg Subl (Nitroglycerin) .... Dissolve one tablet sublingual every 5 minutes as needed chest pain. max dose 3tabs in 15 minutes 4)  Levothyroxine Sodium 150 Mcg Tabs (Levothyroxine sodium) .Marland Kitchen.. 1 tab by mouth daily 5)  Gabapentin 100 Mg Caps (Gabapentin) .Marland Kitchen.. 1 capsule by mouth in the am and at noon.  2 capsules at night 6)  Tramadol Hcl 50 Mg Tabs (Tramadol hcl) .... 2 tabs by mouth tid 7)  Metoprolol Tartrate 50 Mg Tabs (Metoprolol tartrate) .Marland Kitchen.. 1 tab by mouth bid 8)  Simvastatin 20 Mg Tabs (Simvastatin) .Marland Kitchen.. 1 tab by mouth qhs 9)  Zolpidem Tartrate 10 Mg Tabs (Zolpidem tartrate) .Marland Kitchen.. 1 tab by mouth at bedtime as needed sleep 10)  Glyburide 5 Mg Tabs (  Glyburide) .Marland Kitchen.. 1 tab by mouth two times a day with food 11)  Fluoxetine Hcl 20 Mg Tabs (Fluoxetine hcl) .Marland Kitchen.. 1 tab by mouth daily 12)  Metformin Hcl 500 Mg Tabs (Metformin hcl) .Marland Kitchen.. 1 tab by mouth two times a day 13)  Cortisporin 1% Solution  .... Instill 4 drops into the l ear 3 x daily x 7 days 14)  Amoxicillin 500 Mg Caps (Amoxicillin) .Marland Kitchen.. 1 tab by mouth three times a day x 10 days  Patient Instructions: 1)  Treat sinusitis/ dizziness/ external otitis with: 2)  Amoxicillin 500 mg 3 x a day x 10 days. 3)  Use Nasonex 2 sprays/ nostril daily until complete. 4)  Use ear drops as directed.  Keep ear dry. 5)  Use OTC non drowsy dramamine for residual dizziness. 6)  Call if not improved in 2 wks. Prescriptions: AMOXICILLIN 500 MG CAPS (AMOXICILLIN) 1 tab by mouth three times a day x 10 days  #30 x 0   Entered and Authorized by:   Seymour Bars DO   Signed by:   Seymour Bars DO on 09/21/2009   Method used:   Electronically to        [facility] [phone]* (retail)       [address] [place], [place]  [address]       Ph: [phone]       Fax: [phone]   RxID:   [id number] CORTISPORIN 1% SOLUTION instill 4 drops into the L ear 3 x daily x 7 days  #10 ml x 0   Entered and Authorized by:   Seymour Bars DO   Signed by:   Seymour Bars DO on 09/21/2009   Method used:   Printed then faxed to ...       [address] [phone]* (retail)       [address] [place], [place]  [address]       Ph: [phone]       Fax: [phone]   RxID:   [id number]

## 2010-09-06 NOTE — Progress Notes (Signed)
Summary: BP update from patient  Phone Note Call from Patient   Caller: Patient Summary of Call: Pt called to update you on her BP.... pt. states that her BP has been running low at 82/48,    92/62,    103/69... Call her back and let her know your suggestions. Thanks, Michaelle Copas  February 08, 2010 9:23 AM  Initial call taken by: Michaelle Copas,  February 08, 2010 9:23 AM  Follow-up for Phone Call        stop Chlorthalidone.  REturn on Thursday for an OV.   Follow-up by: Seymour Bars DO,  February 08, 2010 9:39 AM  Additional Follow-up for Phone Call Additional follow up Details #1::        I called pt and informed her to stop the Chlothalidone and scheduled her for Thursday Additional Follow-up by: Michaelle Copas,  February 08, 2010 12:03 PM

## 2010-09-06 NOTE — Assessment & Plan Note (Signed)
Summary: RLQ pain   Vital Signs:  Patient profile:   65 year old female Height:      67 inches Weight:      212 pounds O2 Sat:      97 % on Room air Pulse rate:   67 / minute BP sitting:   95 / 61  (left arm) Cuff size:   large  Vitals Entered By: Kathlene November (February 04, 2010 1:53 PM)  O2 Flow:  Room air CC: Stays SOB alot- having right lower flank pain that radiates to navel- gets nauseated at times   Primary Care Provider:  Seymour Bars DO  CC:  Stays SOB alot- having right lower flank pain that radiates to navel- gets nauseated at times.  History of Present Illness: 65 yo WF presents for continued SOB.  On Tuesday, she had some nausea and periumblical pain.  She has had diarrhea since Monday.  She had been constipatied before the diarrhea started.  She has COPD, still smoking.  Still SOB.  Denies CP.  She has hx of diarrhea on and off for years.  Current Medications (verified): 1)  Aspirin 81 Mg Tbec (Aspirin) .Marland Kitchen.. 1 Tab By Mouth Qd 2)  Lisinopril 20 Mg Tabs (Lisinopril) .Marland Kitchen.. 1 Tab By Mouth Bid 3)  Nitrostat 0.4 Mg Subl (Nitroglycerin) .... Dissolve One Tablet Sublingual Every 5 Minutes As Needed Chest Pain. Max Dose 3tabs in 15 Minutes 4)  Levothyroxine Sodium 150 Mcg Tabs (Levothyroxine Sodium) .Marland Kitchen.. 1 Tab By Mouth Daily 5)  Gabapentin 100 Mg Caps (Gabapentin) .Marland Kitchen.. 1 Capsule By Mouth in The Am and At [facility].  2 Capsules At Night 6)  Tramadol Hcl 50 Mg Tabs (Tramadol Hcl) .... 2 Tabs By Mouth Tid 7)  Metoprolol Tartrate 100 Mg Tabs (Metoprolol Tartrate) .Marland Kitchen.. 1 Tab By Mouth Bid 8)  Simvastatin 20 Mg Tabs (Simvastatin) .Marland Kitchen.. 1 Tab By Mouth Qhs 9)  Zolpidem Tartrate 10 Mg Tabs (Zolpidem Tartrate) .Marland Kitchen.. 1 Tab By Mouth At Bedtime As Needed Sleep 10)  Glyburide 5 Mg Tabs (Glyburide) .Marland Kitchen.. 1 Tab By Mouth Two Times A Day With Food 11)  Fluoxetine Hcl 20 Mg Tabs (Fluoxetine Hcl) .Marland Kitchen.. 1 Tab By Mouth Daily 12)  Metformin Hcl 500 Mg Tabs (Metformin Hcl) .Marland Kitchen.. 1 Tab By Mouth Two Times A Day 13)   Chlorthalidone 25 Mg Tabs (Chlorthalidone) .Marland Kitchen.. 1 Tab By Mouth Daily 14)  Promethazine Hcl 12.5 Mg Tabs (Promethazine Hcl) .Marland Kitchen.. 1 Tab By Mouth Q 6 Hrs As Needed Nausea 15)  Ascensia Contour Test Strips .... Use As Directed To Check Blood Sugar Two Times A Day Dx:250.0 16)  Lancets  Misc (Lancets) .... Use As Directed To Check Blood Sugar Two Times A Day Dx:250.0 17)  Dexilant 60 Mg Cpdr (Dexlansoprazole) .... Take 1 Tab By Mouth Once Daily  Allergies (verified): No Known Drug Allergies  Comments:  Nurse/Medical Assistant: The patient's medications and allergies were reviewed with the patient and were updated in the Medication and Allergy Lists. Kathlene November (February 04, 2010 1:54 PM)  Past History:  Past Medical History: Reviewed history from 04/02/2009 and no changes required. 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) TOBACCO DEPENDENCE (ICD-305.1) HYPOTHYROIDISM, UNSPECIFIED (ICD-244.9) GASTRIC ULCER ACUTE WITHOUT HEMORRHAGE (ICD-533.30) DIABETES MELLITUS II, UNCOMPLICATED (ICD-250.00) DEPRESSION, MAJOR, RECURRENT (ICD-296.30)  OBESITY (ICD-278.00)  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 Cholecystectomy  R TKR This is a left L5 dorsal ramus  injection, left L4 medial branch block,  and bilateral L3 medial branch block under fluoroscopic guidance.  .Fibroid tumor removal and knee replacement.   Social History: Reviewed history from 10/09/2008 and no changes required. Recently moved here from [place].  Widowed- husband died of ALS.  Out of work due to back and leg pain.  Lives alone, but watches grandkids.  No insurance.  Smokes 1/2ppd. - trying to quit.  Wants to start water aerobics.  Depressed mood since loss of husband. Uses cane.    Review of Systems      See HPI  Physical Exam  General:  alert, well-developed, well-nourished, and well-hydrated.   Head:  normocephalic and atraumatic.   Eyes:  sclera non icteric Mouth:  pharynx pink and moist.   Neck:  no masses.   Lungs:  normal respiratory effort.  Lungs CTA. prolonged exp phase Heart:  normal rate and regular rhythm.   Abdomen:  Bowel sounds positive,abdomen soft and non-tender without masses, organomegaly or  Extremities:  no LE edema Skin:  color normal.  no jaundice or pallor Psych:  good eye contact, not anxious appearing, and not depressed appearing.     Impression & Recommendations:  Problem # 1:  RLQ PAIN (ICD-789.03) AAS came back normal ruling out bowel obstruction or free air.  Will proceed with a CMP and CBC.  Clear fluids, advancing to bland diet.  No sign of acute abdomen on exam.  Any worse --> go to the ED. Orders: T-DG ABD Acute w/Chest (46962) T-CBC w/Diff ([id number]) T-Comprehensive Metabolic Panel ([id number])  Problem # 2:  HYPERTENSION, BENIGN SYSTEMIC (ICD-401.1) BP is too LOW.  Will stop Chlorthalidone and keep her off ACEi until diarrhea improves to prevent renal insuff.    The following medications were removed from the medication list:    Chlorthalidone 25 Mg Tabs (Chlorthalidone) .Marland Kitchen... 1 tab by mouth daily Her updated medication list for this problem includes:    Lisinopril 20 Mg Tabs (Lisinopril) .Marland Kitchen... 1 tab by mouth bid     Metoprolol Tartrate 100 Mg Tabs (Metoprolol tartrate) .Marland Kitchen... 1 tab by mouth bid  Complete Medication List: 1)  Aspirin 81 Mg Tbec (Aspirin) .Marland Kitchen.. 1 tab by mouth qd 2)  Lisinopril 20 Mg Tabs (Lisinopril) .Marland Kitchen.. 1 tab by mouth bid 3)  Nitrostat 0.4 Mg Subl (Nitroglycerin) .... Dissolve one tablet sublingual every 5 minutes as needed chest pain. max dose 3tabs in 15 minutes 4)  Levothyroxine Sodium 150 Mcg Tabs (Levothyroxine sodium) .Marland Kitchen.. 1 tab by mouth daily 5)  Gabapentin 100 Mg Caps (Gabapentin) .Marland Kitchen.. 1 capsule by mouth in the am and at noon.  2 capsules at night 6)  Tramadol Hcl 50 Mg Tabs (Tramadol hcl) .... 2 tabs by mouth tid 7)  Metoprolol Tartrate 100 Mg Tabs (Metoprolol tartrate) .Marland Kitchen.. 1 tab by mouth bid 8)  Simvastatin 20 Mg Tabs (Simvastatin) .Marland Kitchen.. 1 tab by mouth qhs 9)  Zolpidem Tartrate 10 Mg Tabs (Zolpidem tartrate) .Marland Kitchen.. 1 tab by mouth at bedtime as needed sleep 10)  Glyburide 5 Mg Tabs (Glyburide) .Marland Kitchen.. 1 tab  by mouth two times a day with food 11)  Fluoxetine Hcl 20 Mg Tabs (Fluoxetine hcl) .Marland Kitchen.. 1 tab by mouth daily 12)  Metformin Hcl 500 Mg Tabs (Metformin hcl) .Marland Kitchen.. 1 tab by mouth two times a day 13)  Ascensia Contour Test Strips  .... Use as directed to check blood sugar two times a day dx:250.0 14)  Lancets Misc (Lancets) .... Use as directed to check blood sugar two times a day dx:250.0 15)  Dexilant 60 Mg Cpdr (Dexlansoprazole) .... Take 1 tab by mouth once daily  Patient Instructions: 1)  Xray abdomen/ chest today. 2)  Will call you w/ results tonight. 3)  Start on ProAir 2 puffs 4 x a day for shortness of breathe. 4)  Wean down off cigarettes. 5)  Clear liquid diet x 48 hrs.   6)  Return Tuesday if abdominal pain has not improved.  Appended Document: RLQ pain Pls have pt go to lab on WED AM for a CBC and CMP ( blood test).  Seymour Bars, D.O.

## 2010-09-06 NOTE — Assessment & Plan Note (Signed)
Summary: edema   Vital Signs:  Patient profile:   65 year old female Height:      67 inches Weight:      217 pounds BMI:     34.11 O2 Sat:      92 % on Room air Pulse rate:   54 / minute BP sitting:   170 / 90  (left arm) Cuff size:   large  Vitals Entered By: Payton Spark CMA (March 09, 2010 2:11 PM)  O2 Flow:  Room air CC: Retaining fluid. Also c/o L ear pressure and drainage.   Primary Care Provider:  Seymour Bars DO  CC:  Retaining fluid. Also c/o L ear pressure and drainage.[name]  History of Present Illness: 65 yo WF presents for fluid retention.  She started to feel like this 1 wk ago with bags under her eyes and leg swelling.  She also has rining in her L ears and sinus HAs. Her weight is up 6 lbs.  She denies SOB, orthopnea or PND.  She has been trying to elevate her legs.  Feels like she is voiding less.  Her Cr did improve with stopping Lisinopril and Chlorthalidone.  she is back on her metformin.  Chlorthalidone was removed 3-4 wks ago.   She still has drainage, pain and itching in the L ear.  12-09 normal echo. Thyroid dose adjusted last month.       Allergies: No Known Drug Allergies  Past History:  Past Medical History: Reviewed history from 04/02/2009 and no changes required. 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) TOBACCO DEPENDENCE (ICD-305.1) HYPOTHYROIDISM, UNSPECIFIED (ICD-244.9) GASTRIC ULCER ACUTE WITHOUT HEMORRHAGE (ICD-533.30) DIABETES MELLITUS II, UNCOMPLICATED (ICD-250.00) DEPRESSION, MAJOR, RECURRENT (ICD-296.30) OBESITY  (ICD-278.00)  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 Recently moved here from [place].  Widowed- husband died of ALS.  Out of work due to back and leg pain.  Lives alone, but watches grandkids.  No insurance.  Smokes 1/2ppd. - trying to quit.  Wants to start water aerobics.  Depressed mood since loss of husband. Uses cane.    Review of Systems      See HPI  Physical Exam  General:  alert, well-developed, well-nourished, and well-hydrated.  alert, well-developed,  Head:  normocephalic and atraumatic.   Eyes:  mild edema under eyes Ears:  white exudates in the L EAC with canal edema Nose:  no nasal discharge.   Mouth:  pharynx pink and moist.   Neck:  no masses.  no JVD Lungs:  normal respiratory effort.  Lungs CTA. prolonged exp phase Heart:  normal rate and regular rhythm.   Abdomen:  soft.   Pulses:  2+ PT and radial pulses Extremities:  1+ pitting LE edema bilat with minimal UE hand edema Skin:  color normal.   Psych:  good eye contact, not anxious appearing, and not depressed appearing.     Impression & Recommendations:  Problem # 1:  EDEMA (ICD-782.3) Assessment New  Edema x 1 wk with 6 lbs  wt gain, HAs and elevated BP likely due to stopping Chlorthalidone 3 wks ago (due to rise in BUN/Cr).  Voiding less than normal.  Will give Lasix 20 mg IM x 1 today followed by RX Furosemide 40 mg every other day.  REcheck BP/ BMP/ WT in 2 wks.   Unable to void to test for proteinuria. Her updated medication list for this problem includes:    Furosemide 40 Mg Tabs (Furosemide) .[name]... 1 tab by mouth every other day  Orders: Furosemide- Lasix Injection  (J1940) Admin of Therapeutic Inj  intramuscular or subcutaneous (16[address])  Problem # 2:  OTITIS EXTERNA (ICD-380.10)  Her updated medication list for this problem includes:    Ciprodex 0.3-0.1 % Susp (Ciprofloxacin-dexamethasone) .[name] KitchenMarland KitchenMarland KitchenMarland [name] 4 drops in the left ear twice a day for 10 days  Complete Medication List: 1)  Aspirin 81 Mg Tbec (Aspirin) .[name].. 1 tab by mouth qd 2)  Nitrostat 0.4 Mg Subl (Nitroglycerin) .... Dissolve one tablet sublingual every 5 minutes as needed chest pain. max dose 3tabs in 15 minutes 3)  Levothyroxine Sodium 100 Mcg Tabs (Levothyroxine sodium) .[name].. 1 tab by mouth daily 4)  Gabapentin 100 Mg Caps (Gabapentin) .[name].. 1 capsule by mouth in the am and at noon.  2 capsules at night 5)  Tramadol Hcl 50 Mg Tabs (Tramadol hcl) .... 2 tabs by mouth tid 6)  Metoprolol Tartrate 100 Mg Tabs (Metoprolol tartrate) .[name].. 1 tab by mouth bid 7)  Simvastatin 20 Mg Tabs (Simvastatin) .[name].. 1 tab by mouth qhs 8)  Zolpidem Tartrate 10 Mg Tabs (Zolpidem tartrate) .[name].. 1 tab by mouth at bedtime as needed sleep 9)  Glyburide 5 Mg Tabs (Glyburide) .[name].. 1 tab by mouth two times a day with meals 10)  Dexilant 60 Mg Cpdr (Dexlansoprazole) .... Take 1 tab by mouth once daily 11)  Acyclovir 800 Mg Tabs (Acyclovir) .[name].. 1 tab by mouth 5 x a day x 7 days 12)  Furosemide 40 Mg Tabs (Furosemide) .[name].. 1 tab by mouth every other day 13)  Ciprodex 0.3-0.1 % Susp (Ciprofloxacin-dexamethasone) .... 4 drops in the left ear twice a day for 10 days  Patient Instructions: 1)  Lasix 20 mg IM x 1 today to help with fluid retention. 2)  Start Oral Furosemide 40 mg every other day. 3)  Return for f/u in 2 wks - check wt/ BP/ BMP (nurse visit).   Prescriptions: CIPRODEX 0.3-0.1 % SUSP (CIPROFLOXACIN-DEXAMETHASONE) 4 drops in the Left ear twice a day for 10 days  #7.5 ml x 0   Entered and Authorized by:   Seymour Bars DO   Signed by:   Seymour Bars DO on 03/09/2010   Method used:   Electronically to        [facility] [phone]* (retail)       [address] [place], [place]  [address]       Ph: [phone]       Fax: [phone]   RxID:   [id number] FUROSEMIDE 40 MG TABS (FUROSEMIDE) 1 tab by mouth every other day  #30 x 1   Entered and Authorized by:   Seymour Bars DO   Signed by:   Seymour Bars DO on 03/09/2010   Method used:   Electronically to        [facility] [phone]* (retail)       [address] [place], [place]  [address]  Ph: [phone]       Fax: [phone]   RxID:   [id number]    Medication Administration  Injection # 1:    Medication: Furosemide- Lasix Injection    Diagnosis: EDEMA (ICD-782.3)    Route: IM    Site: LUOQ gluteus    Exp Date: 10/12    Lot #: [id number]    Comments: 20mg     Patient tolerated injection without complications    Given by: Payton Spark CMA (March 09, 2010 3:05 PM)

## 2010-09-06 NOTE — Progress Notes (Signed)
Summary: Ambien to [facility]  Phone Note Refill Request   Refills Requested: Medication #1:  ZOLPIDEM TARTRATE 10 MG TABS 1 tab by mouth at bedtime as needed sleep Pt states she is no longer using mail order pharm. She needs ambien sent to [facility] in [place] [employer] order company will not transfer it.   Initial call taken by: Payton Spark CMA,  August 26, 2009 10:06 AM Caller: Patient Summary of Call:      Prescriptions: ZOLPIDEM TARTRATE 10 MG TABS (ZOLPIDEM TARTRATE) 1 tab by mouth at bedtime as needed sleep  #30 x 1   Entered and Authorized by:   Seymour Bars DO   Signed by:   Seymour Bars DO on 08/26/2009   Method used:   Printed then faxed to ...       [address] [phone]* (retail)       [address] [place], [place]  [address]       Ph: [phone]       Fax: [phone]   RxID:   [id number]   Appended Document: Ambien to [facility] Called in today bc pharm did not receive fax

## 2010-09-06 NOTE — Progress Notes (Signed)
Summary: Sliding scale  Phone Note Call from Patient   Caller: Patient Summary of Call: Pt states she is only taking gliberide and her fasting sugar was 190 this AM. Pt unsure how to use sliding scale bc one was not included with her pen. Pt would like to know the numbers for the sliding scale and would also like to know if she can start insulin now w/ or w/ out gliberide bc having her sugars so high makes her nervous. Please advise. Initial call taken by: Payton Spark CMA,  February 16, 2010 8:42 AM  Follow-up for Phone Call        Pt can come in to pick up her sliding scale insulin and you can show her how to use the pen.  If she does inject Novolog for sliding scale while still on the glyburide, she needs to check her sugar 30 min later.  Only do one injection of insulin.  Do not repeat.   Follow-up by: Seymour Bars DO,  February 16, 2010 9:14 AM     Appended Document: Sliding scale Explained the above to Pt in detail. Will mail another copy of scale to Pt today.

## 2010-09-06 NOTE — Progress Notes (Signed)
Summary: Chantix refill  Phone Note Call from Patient   Summary of Call: Pt request continue pack of Chantix. She also wanted you to know that the Chantix is also causing nausea. Pt states nausea is listed as a SE but would like to know if dose should be adjusted or if she should take differently. Please advise. Initial call taken by: Payton Spark CMA,  November 22, 2009 9:58 AM  Follow-up for Phone Call        make sure she is taking it with largest meal of the day and a full glass of water.  If she needs RX for nausea, will add this. Follow-up by: Seymour Bars DO,  November 22, 2009 10:06 AM    New/Updated Medications: CHANTIX CONTINUING MONTH PAK 1 MG TABS (VARENICLINE TARTRATE) take as directed Prescriptions: CHANTIX CONTINUING MONTH PAK 1 MG TABS (VARENICLINE TARTRATE) take as directed  #1 pack x 3   Entered and Authorized by:   Seymour Bars DO   Signed by:   Seymour Bars DO on 11/22/2009   Method used:   Electronically to        [facility] [phone]* (retail)       [address] [place], [place]  [address]       Ph: [phone]       Fax: [phone]   RxID:   [id number]   Appended Document: Chantix refill Pt aware  Appended Document: Chantix refill pt states chantix is still causing nausea and would like Rx sent to pharm.  Appended Document: Chantix refill Will change her back to the lower dose due to nausea and I will send RX for nausea.  Seymour Bars, D.O.  Appended Document: Chantix refill   Appended Document: Chantix refill Pt aware of the above

## 2010-09-06 NOTE — Assessment & Plan Note (Signed)
Summary: [facility]   Visit Type:  Follow-up Primary Provider:  Seymour Bars DO  CC:  Fluid retention.  History of Present Illness: [name] is a pleasant female who has a history of a  syncopal episode in December of 2009..  A Myoview showed no ischemia or infarction, but only breast attenuation and normal LV function.  An echocardiogram also showed normal LV function.  A CardioNet monitor  showed no significant arrhythmia.  She also has a history of an iliac artery aneurysm. Followup CTA in July 2010 showed atherosclerosis in the abdominal aorta, greater than 50% left renal artery stenosis, and distal right common iliac fusiform aneurysm, maximal diameter 22 mm, length 26 mm. I last saw her in July of 2010. Since then she does describe dyspnea on exertion relieved with rest. She attributes this to her lung disease. There is no associated chest pain. There is no orthopnea, PND, syncope or chest pain. She has noticed increased pedal edema and was started on Lasix today by her primary care physician. Note her blood pressure apparently was low approximately one month ago and her medications have been adjusted.It has increased in the past 3 days.  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) TOBACCO DEPENDENCE (ICD-305.1) HYPOTHYROIDISM, UNSPECIFIED (ICD-244.9) GASTRIC ULCER ACUTE WITHOUT HEMORRHAGE (ICD-533.30) DIABETES MELLITUS II, UNCOMPLICATED (ICD-250.00) DEPRESSION, MAJOR, RECURRENT (ICD-296.30) OBESITY (ICD-278.00)  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 Cholecystectomy  R TKR This is a left L5 dorsal ramus injection, left L4 medial branch block,  and bilateral L3 medial branch block under fluoroscopic guidance.  .Fibroid tumor removal and knee replacement.   Social History: Reviewed history from 10/09/2008 and no changes required. Recently moved here from [place].  Widowed- husband died of ALS.  Out of work due to back and leg pain.  Lives alone, but watches grandkids.  No insurance.  Smokes 1/2ppd. - trying to quit.  Wants to start water aerobics.  Depressed mood since loss of husband. Uses cane.    Review of Systems       no fevers or chills, productive cough, hemoptysis, dysphasia, odynophagia, melena, hematochezia, dysuria, hematuria, rash, seizure activity, orthopnea, PND, pedal edema, claudication. Remaining systems are negative.   Vital Signs:  Patient profile:   65 year old female Height:      67 inches Weight:      216.50 pounds BMI:     34.03 Pulse rate:   50 / minute Pulse rhythm:   regular Resp:     18 per minute BP sitting:   198 / 93  (right arm) Cuff size:   large  Vitals Entered By: Vikki Ports (March 09, 2010 2:57 PM)  Physical Exam  General:  Well-developed well-nourished in no acute distress.  Skin is warm and dry.  HEENT is normal.  Neck is supple. No thyromegaly.  Chest is clear to auscultation with normal expansion.  Cardiovascular exam is regular rate and rhythm.  Abdominal exam nontender or  distended. No masses palpated. Extremities show no edema. neuro grossly intact    EKG  Procedure date:  03/09/2010  Findings:      Sinus rhythm at a rate of 50.  Axis normal. No ST changes.  Impression & Recommendations:  Problem # 1:  HYPERTENSION, BENIGN SYSTEMIC (ICD-401.1) Her blood pressure is elevated today. However Lasix was added by her primary care physician. Approximately one month ago it apparently was low. She will follow this and additional medications added as indicated. Note she does have a history of renal artery stenosis and if her blood pressure becomes difficult to control we will consider referral for her possible PTA. Her updated medication list for this problem includes:    Aspirin 81 Mg Tbec (Aspirin) .Marland Kitchen... 1 tab by mouth qd    Metoprolol Tartrate 100 Mg Tabs (Metoprolol tartrate) .Marland Kitchen... 1 tab by mouth bid  Problem # 2:  HYPERLIPIDEMIA (ICD-272.4)  Continue statin. Lipids and liver monitored by primary care. Her updated medication list for this problem includes:    Simvastatin 20 Mg Tabs (Simvastatin) .Marland Kitchen... 1 tab by mouth qhs  Her updated medication list for this problem includes:    Simvastatin 20 Mg Tabs (Simvastatin) .Marland Kitchen... 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 She may require surgery in the future. Hold Lasix the day of the procedure and one day prior to.  Orders: CT Scan  (CT Scan)  Problem # 5:  TOBACCO DEPENDENCE (ICD-305.1) Patient has discontinued for one month.  Problem # 6:  DIABETES MELLITUS II, UNCOMPLICATED (ICD-250.00)  Her updated medication list for this problem includes:    Aspirin 81 Mg Tbec (Aspirin) .Marland Kitchen... 1 tab by mouth qd    Glyburide 5 Mg Tabs (Glyburide) .Marland Kitchen... 1 tab by mouth two times a day with meals  Her updated medication list for this problem includes:    Aspirin 81 Mg Tbec (Aspirin) .Marland Kitchen... 1 tab by mouth qd    Glyburide 5 Mg Tabs (Glyburide) .Marland Kitchen... 1 tab by mouth two times a day with meals  Her updated medication list for this problem  includes:    Aspirin 81 Mg Tbec (Aspirin) .Marland Kitchen... 1 tab by mouth qd    Glyburide 5 Mg Tabs (Glyburide) .Marland Kitchen... 1 tab by mouth two times a day with meals  Problem # 7:  HYPOTHYROIDISM, UNSPECIFIED (ICD-244.9)  Her updated medication list for this problem includes:    Levothyroxine Sodium 100 Mcg Tabs (Levothyroxine sodium) .Marland Kitchen... 1 tab by mouth daily  Her updated medication list for this problem includes:    Levothyroxine Sodium 100 Mcg Tabs (Levothyroxine sodium) .Marland Kitchen... 1 tab by mouth daily  Her updated medication list for this problem includes:    Levothyroxine Sodium 100 Mcg Tabs (Levothyroxine sodium) .Marland Kitchen... 1 tab by mouth daily  Patient Instructions: 1)  Your physician recommends that you schedule a follow-up appointment in:ONE YEAR 2)  Non-Cardiac CT Angiography (CTA), is a special type of CT scan that uses a computer to produce multi-dimensional views of major blood vessels throughout the body. In CT angiography, a contrast material is injected through an IV to help visualize the blood vessels. DO NOT TAKE FUROSEMIDE THE DAY BEFORE OR THE DAY OF TESTING-03-15-10 @ 12:30P AT [facility] [place]

## 2010-09-08 NOTE — Letter (Signed)
Summary: Letter Regarding Barium Swallow Results/[facility]  Letter Regarding Barium Swallow Results/[facility]   Imported By: Lanelle Bal 07/25/2010 10:11:08  _____________________________________________________________________  External Attachment:    Type:   Image     Comment:   External Document

## 2010-09-08 NOTE — Letter (Signed)
Summary: [facility]  [facility]   Imported By: Lanelle Bal 07/22/2010 12:01:12  _____________________________________________________________________  External Attachment:    Type:   Image     Comment:   External Document

## 2010-09-08 NOTE — Consult Note (Signed)
Summary: [facility]  [facility]   Imported By: Lanelle Bal 07/26/2010 09:32:10  _____________________________________________________________________  External Attachment:    Type:   Image     Comment:   External Document

## 2010-09-15 ENCOUNTER — Ambulatory Visit: Payer: Self-pay | Admitting: Family Medicine

## 2010-09-20 ENCOUNTER — Ambulatory Visit: Payer: Self-pay | Admitting: Physical Medicine & Rehabilitation

## 2010-09-20 ENCOUNTER — Encounter: Payer: Medicare Other | Attending: Physical Medicine & Rehabilitation

## 2010-09-29 ENCOUNTER — Encounter: Payer: Self-pay | Admitting: Family Medicine

## 2010-09-29 ENCOUNTER — Ambulatory Visit (INDEPENDENT_AMBULATORY_CARE_PROVIDER_SITE_OTHER): Payer: Medicare Other | Admitting: Family Medicine

## 2010-09-29 DIAGNOSIS — L259 Unspecified contact dermatitis, unspecified cause: Secondary | ICD-10-CM | POA: Insufficient documentation

## 2010-09-29 DIAGNOSIS — E119 Type 2 diabetes mellitus without complications: Secondary | ICD-10-CM

## 2010-09-29 DIAGNOSIS — G600 Hereditary motor and sensory neuropathy: Secondary | ICD-10-CM

## 2010-09-29 DIAGNOSIS — E039 Hypothyroidism, unspecified: Secondary | ICD-10-CM

## 2010-09-30 ENCOUNTER — Encounter: Payer: Self-pay | Admitting: Family Medicine

## 2010-10-04 ENCOUNTER — Telehealth: Payer: Self-pay | Admitting: Family Medicine

## 2010-10-04 NOTE — Consult Note (Signed)
Summary: [facility]  [facility]   Imported By: Lanelle Bal 09/28/2010 13:04:41  _____________________________________________________________________  External Attachment:    Type:   Image     Comment:   External Document

## 2010-10-04 NOTE — Assessment & Plan Note (Signed)
Summary: f/u DM/ facial rash   Vital Signs:  Patient profile:   65 year old female Height:      67 inches Weight:      221 pounds BMI:     34.74 O2 Sat:      95 % on Room air Pulse rate:   58 / minute BP sitting:   128 / 72  (left arm) Cuff size:   large  Vitals Entered By: Payton Spark CMA (September 29, 2010 10:05 AM)  O2 Flow:  Room air CC: facial redness, burning and itching x 1 week. Also c/o high sugars and would like referral to podiatry   Primary Care Provider:  Seymour Bars DO  CC:  facial redness and burning and itching x 1 week. Also c/o high sugars and would like referral to podiatry.  History of Present Illness: 65 yo WF presents for red swollen cheeks x 10 days.  She is very itchy.  She was itchy all over last night after spending time outside.  No new meds, new soaps, detergents, lotions.  No change in facial products.  No blistering.  Denies pain.    She did try Benadryl and OTC [facility] and it only took some of the itch away.  She has eye itching and swelling but no watering or sneezing.  Little rhinorrhea.  Never had problems swallowing or increased dyspnea.  Denies any orthopnea.  Amiben is helping her sleep at night.  She reports that her sugars are running HIGH at home despite taking glipizide and metformin XR and has fair dietary adherence with no change to diet or activity level.  Her AM fastings are 180-200s.    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    Allergies: No Known Drug Allergies  Past History:  Past Medical History: Reviewed history from 07/14/2010 and no changes required. 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) TOBACCO DEPENDENCE (ICD-305.1) HYPOTHYROIDISM, UNSPECIFIED (ICD-244.9) GASTRIC ULCER ACUTE WITHOUT HEMORRHAGE (ICD-533.30) DIABETES MELLITUS II, with neuropathy and neurogenic bladder DEPRESSION, MAJOR, RECURRENT (ICD-296.30) OBESITY (ICD-278.00)  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 Cholecystectomy  R TKR This is a left L5 dorsal ramus injection, left L4 medial branch block,  and bilateral L3 medial branch block under fluoroscopic guidance.  .Fibroid tumor removal and knee replacement.   Social History: Reviewed history from 10/09/2008 and no changes required. Recently moved here from [place].  Widowed- husband died of ALS.  Out of work due to back and leg pain.  Lives alone, but watches grandkids.  No insurance.  Smokes 1/2ppd. - trying to quit.  Wants to start water aerobics.  Depressed mood since loss of husband. Uses cane.    Review of Systems      See HPI  Physical Exam  General:  alert, well-developed, well-nourished, well-hydrated, and overweight-appearing.  ambulating with walker Head:  normocephalic and atraumatic.  slight edema over both cheeks  Eyes:  conjunctiva clear, minimal lower eyelid edema Nose:  no nasal discharge.   Mouth:  pharynx pink and moist.  patent airway Neck:  no masses.   Lungs:  normal respiratory effort.  Lungs CTA. prolonged exp phase Heart:  normal rate, regular rhythm, no murmur, no gallop, and no rub.   Extremities:  no UE or LE edema Skin:   nonblachable redness, slight warmth over both cheeks, no blistering, scaling, tenderness or telangiectasias Cervical Nodes:  No lymphadenopathy noted Psych:  good eye contact, not anxious appearing, and not depressed appearing.     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  No sign of angioedema and not new drug or environmental exposures.  Will treat with H1+ H2 blocer x 5 days.  Avoiding steroids given her high sugar readings.  Call if not improved in 7 days. Orders: T-Sed Rate (Automated) [id number]) T-CBC w/Diff ([id number])  Problem # 2:  DIABETES MELLITUS II, UNCOMPLICATED (ICD-250.00) Assessment: Deteriorated complicated by neuropathy. A1C high at 9.  Will stop glpizide (likely not working anymore with the progression of her T2DM) and will replace with Lantus, starting at 30 units once daily.  Continue Metformin and closlely monitor renal function.  Call with home readings in 1 wk.  Will probably require  ~50 units/ day.   Her updated medication list for this problem includes:    Aspirin 81 Mg Tbec (Aspirin) .Marland Kitchen... 1 tab by mouth qd    Lantus Solostar 100 Unit/ml Soln (Insulin glargine) .Marland KitchenMarland KitchenMarland KitchenMarland Kitchen 30 units injection qam    Metformin Hcl 500 Mg Xr24h-tab (Metformin hcl) .Marland Kitchen... 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  Will get her in to see Dr Yates Decamp Orders: Podiatry Referral (Podiatry)  Problem # 4:  HYPOTHYROIDISM, UNSPECIFIED (ICD-244.9) Due for TSH.   Her updated medication list for this  problem includes:    Levothyroxine Sodium 125 Mcg Tabs (Levothyroxine sodium) .Marland Kitchen... 1 tab by mouth daily  Orders: T-TSH [id number])  Labs Reviewed: TSH: 6.752 (06/21/2010)    HgBA1c: 9.0 (09/29/2010) Chol: 135 (07/16/2009)   HDL: 46 (07/16/2009)   LDL: 53 (07/16/2009)   TG: 179 (07/16/2009)  Complete Medication List: 1)  Aspirin 81 Mg Tbec (Aspirin) .Marland Kitchen.. 1 tab by mouth qd 2)  Nitrostat 0.4 Mg Subl (Nitroglycerin) .... Dissolve one tablet sublingual every 5 minutes as needed chest pain. max dose 3tabs in 15 minutes 3)  Levothyroxine Sodium 125 Mcg Tabs (Levothyroxine sodium) .Marland Kitchen.. 1 tab by mouth daily 4)  Gabapentin 300 Mg Caps (Gabapentin) .Marland Kitchen.. 1 capsule by mouth bid 5)  Tramadol Hcl 50 Mg Tabs (Tramadol hcl) .... 2 tabs by mouth tid 6)  Metoprolol Tartrate 100 Mg Tabs (Metoprolol tartrate) .Marland Kitchen.. 1 tab by mouth bid 7)  Simvastatin 20 Mg Tabs (Simvastatin) .Marland Kitchen.. 1 tab by mouth qhs 8)  Zolpidem Tartrate 10 Mg Tabs (Zolpidem tartrate) .Marland Kitchen.. 1 tab by mouth at bedtime as needed sleep 9)  Lantus Solostar 100 Unit/ml Soln (Insulin glargine) .... 30 units injection qam 10)  Dexilant 60 Mg Cpdr (Dexlansoprazole) .... Take 1 tab by mouth once daily 11)  Acyclovir  800 Mg Tabs (Acyclovir) .Marland Kitchen.. 1 tab by mouth 5 x a day x 7 days 12)  Furosemide 40 Mg Tabs (Furosemide) .Marland Kitchen.. 1 tab by mouth qam 13)  Metformin Hcl 500 Mg Xr24h-tab (Metformin hcl) .... 2 tabs by mouth once daily with dinner 14)  Bayer Contour Test Strp (Glucose blood) .... Use as directed to check blood sugar two times a day 15)  Lancets Misc (Lancets) .... Use as directed to check blood cugar two times a day 16)  Gradulated Compression Hose  .... Dx: dependent leg edema  Other Orders: T-Basic Metabolic Panel [id number])  Patient Instructions: 1)  For allergic reaction: 2)  Take Benadryl 25 mg q 6 hrs + Pepcid AC every 12 hrs x 5 days. 3)  Call if rash has not resolved in 5 days. 4)  Stop Glipizide but stay on Metformin. 5)  Add  Lantus 30 units injection q AM. 6)  AM fasting goal is 80-130, 2 hrs after dinner <150. 7)  Call me with home sugar readings next wk. 8)  Podiatry referral made.   Prescriptions: LANTUS SOLOSTAR 100 UNIT/ML SOLN (INSULIN GLARGINE) 30 units injection qAM  #2 pens x 1   Entered and Authorized by:   Seymour Bars DO   Signed by:   Seymour Bars DO on 09/29/2010   Method used:   Electronically to        [facility] [phone]* (retail)       [address] [place], [place]  [address]       Ph: [phone]       Fax: [phone]   RxID:   [id number]    Orders Added: 1)  Hgb A1C [[id number]] 2)  Fingerstick [36416] 3)  Podiatry Referral [Podiatry] 4)  T-TSH [[id number]] 5)  T-Basic Metabolic Panel [[id number]] 6)  T-Sed Rate (Automated) [[id number]] 7)  T-CBC w/Diff [[id number]] 8)  Est. Patient Level IV [23557]    Laboratory Results   Blood Tests     HGBA1C: 9.0%   (Normal Range: Non-Diabetic - 3-6%   Control Diabetic - 6-8%)

## 2010-10-04 NOTE — Letter (Signed)
Summary: [facility]  [facility]   Imported By: Lanelle Bal 09/28/2010 13:03:32  _____________________________________________________________________  External Attachment:    Type:   Image     Comment:   External Document

## 2010-10-10 ENCOUNTER — Telehealth: Payer: Self-pay | Admitting: Family Medicine

## 2010-10-13 NOTE — Progress Notes (Signed)
Summary: [facility]-missed Lantus dose  Phone Note Call from Patient Call back at Home Phone [phone]   Caller: Patient Call For: Seymour Bars DO Reason for Call: Talk to Nurse Summary of Call: pt forgot to take Lantus this morning and would like to know how to resume.  Do you want her to take 1/2 tonight and 1/2 tomorrow and resume normal AM dose on Thursday, take none today and return to normal dose tomorrow AM and monitor sugars or take another way?  Please advise re: dosing and CBG checks..  Pt normally takes in AM. Initial call taken by: Francee Piccolo CMA Duncan Dull),  October 04, 2010 4:13 PM  Follow-up for Phone Call        did not recieve message until now.  She is to resume Lantus 30 units this AM. Follow-up by: Seymour Bars DO,  October 05, 2010 8:17 AM     Appended Document: [facility]-missed Lantus dose Notified pt of above.  She restarted Lantus today.  Pt also states she is still having a problem with the dermatitis on her face.  It is getting worse.  Pt states her face is red and her skin is flaking and peeling.  Pt will not leave her house because of this.  Pt finished benadryl/pepcid regimen yesterday.  Pt has also been using Pond's Cold Cream.  Pt has been using this for the last 60 years. Francee Piccolo CMA Duncan Dull)  October 05, 2010 11:06 AM   I added Hydrocortisone RX cream to apply to fash rash two times a day x 7 - 10 days.  Have her use this and let me know if improving by Monday.  Seymour Bars, D.O.  Appended Document: [facility]-missed Lantus dose Pt aware of the above

## 2010-10-18 NOTE — Progress Notes (Signed)
Summary: [facility]-symptom update  Phone Note Call from Patient Call back at Home Phone [phone]   Caller: Patient Call For: Seymour Bars DO Summary of Call: Pt calls with update regarding rash on face.  Pt states the cream is working.  Her face is still dry, but the skin has stopped flaking off. Initial call taken by: Francee Piccolo CMA Duncan Dull),  October 10, 2010 12:45 PM  Follow-up for Phone Call        OK, continue and let me know if redness subsides by the end of the wk.   Follow-up by: Seymour Bars DO,  October 10, 2010 12:53 PM

## 2010-10-19 LAB — CONVERTED CEMR LAB
BUN: 15 mg/dL (ref 6–23)
Basophils Absolute: 0 10*3/uL (ref 0.0–0.1)
Basophils Relative: 1 % (ref 0–1)
Calcium: 10.1 mg/dL (ref 8.4–10.5)
Creatinine, Ser: 1.09 mg/dL (ref 0.40–1.20)
Eosinophils Absolute: 0.2 10*3/uL (ref 0.0–0.7)
Eosinophils Relative: 3 % (ref 0–5)
HCT: 46.3 % — ABNORMAL HIGH (ref 36.0–46.0)
Hemoglobin: 14.8 g/dL (ref 12.0–15.0)
MCHC: 32 g/dL (ref 30.0–36.0)
Monocytes Absolute: 0.6 10*3/uL (ref 0.1–1.0)
Neutro Abs: 4.8 10*3/uL (ref 1.7–7.7)
RDW: 13.7 % (ref 11.5–15.5)
TSH: 4.747 microintl units/mL — ABNORMAL HIGH (ref 0.350–4.500)

## 2010-10-31 ENCOUNTER — Other Ambulatory Visit: Payer: Self-pay | Admitting: Family Medicine

## 2010-10-31 DIAGNOSIS — I10 Essential (primary) hypertension: Secondary | ICD-10-CM

## 2010-11-03 NOTE — Progress Notes (Signed)
Summary: Need appt per Dr Cathey Endow  Phone Note From Other Clinic   Caller: Marcelino Duster with Dr Cathey Endow Summary of Call: This pt has been seeing Dr Cathey Endow for Primary Care and she would like Dr Jens Som to see this pt for routine follow-up.  The pt does not have any acute issues at this time.  They would like Dr Ludwig Clarks nurse to call the pt and stress the importance of follow-up and make an appt. This pt was last seen in August 2011 and per OV note the pt will be seen in 1 year. I will forward this message to Stanton Kidney RN to schedule follow-up (looks like pt has been seen in [place]).  Initial call taken by: Julieta Gutting, RN, BSN,  August 22, 2010 11:55 AM

## 2010-11-17 LAB — COMPREHENSIVE METABOLIC PANEL
AST: 25 U/L (ref 0–37)
Albumin: 3.8 g/dL (ref 3.5–5.2)
Alkaline Phosphatase: 56 U/L (ref 39–117)
BUN: 7 mg/dL (ref 6–23)
Creatinine, Ser: 0.79 mg/dL (ref 0.4–1.2)
GFR calc Af Amer: >60 mL/min (ref >60)
Potassium: 4.3 mEq/L (ref 3.5–5.1)
Total Protein: 6.7 g/dL (ref 6.0–8.3)

## 2010-11-17 LAB — URINALYSIS, ROUTINE W REFLEX MICROSCOPIC
Glucose, UA: NEGATIVE mg/dL
Specific Gravity, Urine: 1.014 (ref 1.005–1.030)
pH: 7 (ref 5.0–8.0)

## 2010-11-17 LAB — DIFFERENTIAL
Eosinophils Relative: 1 % (ref 0–5)
Lymphocytes Relative: 18 % (ref 12–46)
Monocytes Absolute: 0.4 10*3/uL (ref 0.1–1.0)
Monocytes Relative: 5 % (ref 3–12)
Neutro Abs: 5.1 10*3/uL (ref 1.7–7.7)

## 2010-11-17 LAB — URINE MICROSCOPIC-ADD ON

## 2010-11-17 LAB — BRAIN NATRIURETIC PEPTIDE: Pro B Natriuretic peptide (BNP): 68 pg/mL (ref 0.0–100.0)

## 2010-11-17 LAB — CBC
HCT: 42.6 % (ref 36.0–46.0)
Platelets: 224 10*3/uL (ref 150–400)
RDW: 12.5 % (ref 11.5–15.5)
WBC: 6.8 10*3/uL (ref 4.0–10.5)

## 2010-11-17 LAB — POCT CARDIAC MARKERS

## 2010-11-25 ENCOUNTER — Ambulatory Visit (INDEPENDENT_AMBULATORY_CARE_PROVIDER_SITE_OTHER): Payer: Medicare Other | Admitting: Vascular Surgery

## 2010-11-25 ENCOUNTER — Other Ambulatory Visit (INDEPENDENT_AMBULATORY_CARE_PROVIDER_SITE_OTHER): Payer: Medicare Other

## 2010-11-25 DIAGNOSIS — I701 Atherosclerosis of renal artery: Secondary | ICD-10-CM

## 2010-11-25 NOTE — Assessment & Plan Note (Signed)
OFFICE VISIT  [name], [name] MAE DOB:  August 17, 1945                                       11/25/2010 EAVWU#:[id number]  HISTORY OF PRESENT ILLNESS:  This is a 65 year old female that I have been following for a right common iliac artery aneurysm and also a left renal artery stenosis.  When I last saw her in January 2012, her blood pressure was well controlled and she was not interested at that point with an attempt at intervention on the left renal artery.  Additionally, as the data on renal artery stenting is somewhat controversial at this point, there was no absolute indication at this point to proceed forward with any type of intervention.  At this point the patient notes no symptoms whatsoever and only recently had  minor changes to her medication regimen and actually per the patient has had reduction in the doses of her medication.  She notes no change in her urinary habits, and she is not aware of any change in her creatinine.  Also she notes no symptomatology that can be attributed to her right common iliac artery aneurysm.  Past medical history, past surgical history, allergies, medications, social history, family history, and review of system is completely unchanged from her clinic visit on August 12, 2010.  PHYSICAL EXAMINATION:  Today she had a blood pressure 122/82, heart rate of 51, respirations were 12, temperature 98.0. General:  Well-developed, well-nourished, no  apparent distress. HEENT:  Pupils were equal, round, reactive to light.  Extraocular movements intact.  Conjunctivae normal. Pulmonary:  Lungs were clear to auscultation bilaterally.  No rales, rhonchi or wheezing. Cardiovascular:  Regular rate and rhythm.  Normal S1-S2.  No murmurs, rubs, thrills or gallops. Vascular:  She had easily palpable upper extremity pulses.  The carotids were palpable without any bruits.  Had easily palpable femoral pulses. She had palpable pedal pulses on  the right side and left side, with the right being stronger. Abdomen:  Mildly obese, soft.  Abdomen nontender, nondistended.  No guarding, no rebound.  No hepatosplenomegaly.  No obvious pulsatile mass in the left groin.  Musculoskeletal:  Motor strength was 5/5 throughout. No obvious ischemic changes in the lower extremities. Neuro:  No focal weakness or any paresthesias. Skin:  No ulcers or rashes. Lymphatics:  No cervical, axillary, inguinal lymphadenopathy.  NON-INVASIVE VASCULAR IMAGING:  On her renal artery duplex examination on the right side, the RAR ratio was only 1.7 on the left; however, it is 8.2.  The velocity also at the origin was increased from previous up to 543 c/s .  The previous RAR was only 7.5.  More concerning, the kidney size is down from 9.6 cm to 8.6 cm.  MEDICAL DECISION MAKING:  This is a 65 year old female who by report has stable hypertension, actually improved from previous visit; however, she has evidence of a reduction in size the left kidney.  At this point, I think a renal artery angiogram with attempt at possible intervention if necessary to try to preserve left renal mass.  Based on the decreased size of the kidney, it appears that she has been losing kidney mass in the left side, likely due to some degree of stenosis in the renal artery.  We would angio the left renal artery first to get an idea the degree of stenosis and intervene as indicated.  We discussed  the risks of this procedure, including access complication, bleeding, infection, possible renal failure due to the nephrotoxic contrast if necessary, possible rupture or dissection of the renal artery, and possible need for additional procedures including emergent operations if a frank rupture occurs.  I instructed the patient hydrate well on the day before and for the next 2 days after the procedure, in order to mitigate any nephrotoxicity from the contrast dye.  We also discussed the risk  of embolization, rupture and dissection in this patient's case.  She is aware of these risks and has agreed to proceed forward.    Fransisco Hertz, MD Electronically Signed  BLC/MEDQ  D:  11/25/2010  T:  11/25/2010  Job:  [id number]  cc:   Seymour Bars, D.O.

## 2010-11-28 ENCOUNTER — Ambulatory Visit (HOSPITAL_COMMUNITY)
Admission: RE | Admit: 2010-11-28 | Discharge: 2010-11-28 | Disposition: A | Discharge status: Home / Self Care | Payer: Medicare Other | Source: Ambulatory Visit | Attending: Vascular Surgery | Admitting: Vascular Surgery

## 2010-11-28 DIAGNOSIS — I701 Atherosclerosis of renal artery: Secondary | ICD-10-CM | POA: Insufficient documentation

## 2010-11-28 DIAGNOSIS — I10 Essential (primary) hypertension: Secondary | ICD-10-CM | POA: Insufficient documentation

## 2010-11-28 DIAGNOSIS — Z79899 Other long term (current) drug therapy: Secondary | ICD-10-CM | POA: Insufficient documentation

## 2010-11-28 DIAGNOSIS — Z01810 Encounter for preprocedural cardiovascular examination: Secondary | ICD-10-CM | POA: Insufficient documentation

## 2010-11-28 HISTORY — PX: RENAL ARTERY STENT: SHX2321

## 2010-11-28 LAB — POCT I-STAT, CHEM 8
BUN: 17 mg/dL (ref 6–23)
Calcium, Ion: 1.11 mmol/L — ABNORMAL LOW (ref 1.12–1.32)
Creatinine, Ser: 1 mg/dL (ref 0.4–1.2)
Glucose, Bld: 205 mg/dL — ABNORMAL HIGH (ref 70–99)
Sodium: 138 mEq/L (ref 135–145)
TCO2: 28 mmol/L (ref 0–100)

## 2010-11-28 LAB — POCT ACTIVATED CLOTTING TIME
Activated Clotting Time: 181 seconds
Activated Clotting Time: 164 seconds

## 2010-11-29 ENCOUNTER — Other Ambulatory Visit: Payer: Self-pay | Admitting: Family Medicine

## 2010-11-29 NOTE — Op Note (Signed)
[name], [name]                  ACCOUNT NO.:  [id number]  MEDICAL RECORD NO.:  [id number]           PATIENT TYPE:  O  LOCATION:  [facility]                         FACILITY:  [facility]  PHYSICIAN:  Fransisco Hertz, MD       DATE OF BIRTH:  06/26/1946  DATE OF PROCEDURE:  11/28/2010 DATE OF DISCHARGE:  11/28/2010                              OPERATIVE REPORT   PROCEDURES: 1. Right common femoral artery cannulation under ultrasound guidance. 2. Aortogram. 3. Left renal artery selection. 4. Left renal angiogram. 5. Left renal artery stenting, a 5-mm x 12-mm balloon expandable     Cordis Genesis.  PREOPERATIVE DIAGNOSES:  Left renal artery stenosis and left renal atrophy.  POSTOPERATIVE DIAGNOSES:  Left renal artery stenosis and left renal atrophy.  SURGEON:  Fransisco Hertz, MD  ANESTHESIA:  Conscious sedation.  ESTIMATED BLOOD LOSS:  Minimal.  CONTRAST:  70 mL.  SPECIMENS:  None.  FINDINGS: 1. Atherosclerotic aorta which is patent. 2. Patent bilateral renal arteries. 3. Patent bilateral common iliac artery, internal iliac artery, and     external iliac artery. 4. Left common iliac artery has stenosis that is about 50%, looked     calcified. 5. Right common iliac artery aneurysm is about 20 mm in diameter. 6. Bilateral internal iliac arteries have less than 30% stenoses. 7. There is a patent left renal artery after the area stenosis, single     renal artery feeding this kidney with no disease evident in the     branches. 8. There is a greater than 75% stenosis in the left renal artery. 9. There is resolution at the left renal artery stenosis after     stenting with a 5-mm x 12-mm balloon expandable Genesis stent.  INDICATIONS:  This is a 65 year old female who presents with a left renal artery stenosis.  She had a known stenosis that she was not interested on previous followup for intervention.  She has had improvement in her hypertension, so there was no reason from  a hypertension viewpoint to intervene on this left renal artery stenosis, but in between the 25-month interval, she has now had shrinkage of her left kidney by about 1 cm, so it was felt that in order to salvage this kidney, intervention was going to be necessary.  There was also on the duplex examination of this kidney greatly decreased flow in the renal artery distal to the stenosis.  We discussed the risks of the procedure, which included bleeding, infection, possible access site complications, possible rupture of any angioplastied vessels, possible dissection, possible need for emergent intervention, possible embolization, and possible need for additional surgical bypass in the future.  She was aware of these risks and agreed to proceed forward.  DESCRIPTION OF OPERATION:  After full informed written consent had been obtained from the patient, she was brought back to the angio suite, placed supine upon the angio table.  Prior to any conscious sedation, she was connected to the monitoring equipment.  She was then given conscious sedation, the amounts of which are documented in her chart. She was then prepped and  draped in a standard fashion for aortogram and possible intervention.  I turned my attention first to the right groin. Under ultrasound guidance, I identified a small common femoral artery that was patent.  It was actually quite calcified, but eventually I was able to cannulate it with an 18-gauge needle and then passed a Bentson wire through this needle up into the aorta.  The needle was exchanged out for a 6-French sheath.  We then removed the dilator and then loaded an Omni Flush catheter over this wire, which was advanced to the level of L1. The catheter was connected to the power injector circuit after de-airing and declotting the circuit.  The power injector aortic findings are as listed above.  Based on these findings, I felt that she had a hemodynamically significant  stenosis in the left renal artery greater than 75%.  So, at this point, the catheter was exchanged out for a IM guide, which was placed over the wire.  I then exchanged the wire out for a stabilizer wire.  Using this combination, I was able to select out the left renal artery and then I advanced the wire into the branch vessels.  I then engaged the orifice of this left renal artery with the IM guide.  A hand injection was completed.  This demonstrated the findings listed above for the left renal angiogram.  Based on these findings, the vessel distal to the stenosis is widely patent.  It demonstrated a small degree of post stenotic dilatation, on measurement was noted to be about a 5 mm. So, my suspicion is baseline this artery is only about 4 mm.  So, the plan was to use a 5-mm stent.  I then pulled the guide back to the aorta, did a hand injection, and then measured the length from the aortic orifice to the portion of renal artery that appears to be relatively normal.  This measured to be about 1 cm.  So, based on this, I felt that a 5 mm x 12 mm balloon expandable stent could be used.  I re- engaged the guide into the orifice of this left renal artery.  A Cordis Genesis 5-mm x 12-mm stent was loaded over the wire and advanced into the renal artery.  I centered it in the lesion with about 1-2 mm of the stent extending into the aorta and then did another hand injection to verify the position of this stent within the lesion.  The stent was then deployed by inflating to profile.  I then backed the balloon off and did a hand injections.  It demonstrated an excellent position of this stent with about a 1-2 mm overhang.  I then re-engaged the stent with the guide and then placed the balloon with half of the balloon extending into the aorta and then pulled back the guide and did a post dilatation of the stent taking care to flare out the portion of the stent within the aorta.  The balloon was  then deflated and removed.  After completion, angiogram demonstrated a nicely flaired stent with only about 1-2 mm hangover in the aorta and a good smooth transition into the undiseased portion of this renal artery.  The guide and wire was then removed.  Please note that when I had placed IM guide, I had already given 6000 units  of heparin intravenously.  So at the end of the case, the plan was to allow her anticoagulation to revert prior to removing her sheath on the right  side.  I aspirated the sheath after taking out the guide in the wire and then I aspirated no clot and I instilled heparinized saline within the sheath.  Additionally, the patient will receive 300 units of Plavix as a loading dose while in the holding area.  COMPLICATIONS:  None.  CONDITION:  Stable.     Fransisco Hertz, MD     BLC/MEDQ  D:  11/28/2010  T:  11/29/2010  Job:  [id number]  Electronically Signed by Leonides Sake MD on 11/29/2010 10:50:06 AM

## 2010-12-01 ENCOUNTER — Other Ambulatory Visit: Payer: Self-pay | Admitting: Family Medicine

## 2010-12-01 NOTE — Procedures (Unsigned)
RENAL ARTERY DUPLEX EVALUATION  INDICATION:  Known left renal artery stenosis.  HISTORY: Diabetes:  Yes. Cardiac:  No. Hypertension:  Yes. Smoking:  Yes.  RENAL ARTERY DUPLEX FINDINGS: Aorta-Proximal:  42 cm/s Aorta-Mid:  66 cm/s Aorta-Distal:  NV Celiac Artery Origin:  81 cm/s SMA Origin:  130 cm/s                                   RIGHT               LEFT Renal Artery Origin:             109 cm/s            543 cm/s Renal Artery Proximal:           154 cm/s            NV Renal Artery Mid:                cm/s                cm/s Renal Artery Distal:             162 cm/s            79 (dampened) cm/s Hilar Acceleration Time (AT):    m/s2                m/s2 Renal-Aortic Ratio (RAR):        1.7                 8.2 Kidney Size:                     10.4 cm             8.6 cm End Diastolic Ratio (EDR): Resistive Index (RI):            0.73/0.69           0.65/0.57  IMPRESSION:  Technically difficult study due to body habitus, respiratory motion and the patient not being n.p.o. 1. Greater than 60% of the left renal artery. 2. Atrophic left kidney measuring 8.6 cm. 3. Distal left renal artery and waveforms within the kidney are     dampened. 4. Right renal artery and kidney within normal limits. 5. Portions of the left renal artery could not be visualized.  ___________________________________________ Fransisco Hertz, MD  LT/MEDQ  D:  11/25/2010  T:  11/25/2010  Job:  [id number]

## 2010-12-02 ENCOUNTER — Telehealth: Payer: Self-pay | Admitting: Family Medicine

## 2010-12-02 NOTE — Telephone Encounter (Signed)
Patient called into office about prescription refills.  Completely out of medicine.  Please call patient at [phone].

## 2010-12-05 ENCOUNTER — Ambulatory Visit: Payer: Medicare Other | Admitting: Physical Medicine & Rehabilitation

## 2010-12-05 ENCOUNTER — Encounter: Payer: Medicare Other | Attending: Physical Medicine & Rehabilitation

## 2010-12-05 DIAGNOSIS — M47817 Spondylosis without myelopathy or radiculopathy, lumbosacral region: Secondary | ICD-10-CM

## 2010-12-05 DIAGNOSIS — M79609 Pain in unspecified limb: Secondary | ICD-10-CM | POA: Insufficient documentation

## 2010-12-05 DIAGNOSIS — G600 Hereditary motor and sensory neuropathy: Secondary | ICD-10-CM | POA: Insufficient documentation

## 2010-12-05 DIAGNOSIS — G608 Other hereditary and idiopathic neuropathies: Secondary | ICD-10-CM | POA: Insufficient documentation

## 2010-12-05 DIAGNOSIS — M549 Dorsalgia, unspecified: Secondary | ICD-10-CM | POA: Insufficient documentation

## 2010-12-05 DIAGNOSIS — M4310 Spondylolisthesis, site unspecified: Secondary | ICD-10-CM

## 2010-12-05 NOTE — Assessment & Plan Note (Signed)
REASON FOR VISIT:  Back pain as well as lower extremity pain.  HISTORY:  A 65 year old female with a history of hereditary neuropathy who was last seen by me approximately 6 months ago.  She missed an appointment.  She ran out of medicines about a week ago.  She has had increased pain in the legs as well as her back since that time.  She has had no new medical problems in the interval time.  She did have a renal stent approximately 1 week ago per her report. Things went okay except that she did have that would sounds to be a groin hematoma which has been improving.  Her pain meds have been tramadol 52 p.o. t.i.d., Tylenol #3 one and a half tablets per day and gabapentin 300 t.i.d.  PHYSICAL EXAMINATION:  She has decreased sensitivity to touch in the left foot, decrease the right but less so than on the left.  Her back has no tenderness to palpation.  Lumbar range of motion 50% range forward flexion/extension.  IMPRESSION:  Hereditary peripheral neuropathy, Charcot-Marie-Tooth type, left greater than right lower extremity, decreased sensation as well as motor has 2- the left ankle dorsiflexor.  Functionally, she is compensating with a walker.  No need for AFO at the current time.  PLAN:  I will see her back in 6 months.  We will continue her current pain medicines listed above.  If her back flares up again she may benefit from another set of medial branch blocks.  Discussed with the patient and she agrees with plan.     Erick Colace, M.D. Electronically Signed    AEK/MedQ D:  12/05/2010 10:45:29  T:  12/05/2010 23:13:53  Job #:  [id number]  cc:   Seymour Bars, D.O. [facility]. [address] [place], [place] [address]

## 2010-12-09 ENCOUNTER — Ambulatory Visit (INDEPENDENT_AMBULATORY_CARE_PROVIDER_SITE_OTHER): Payer: Medicare Other | Admitting: Vascular Surgery

## 2010-12-09 DIAGNOSIS — I701 Atherosclerosis of renal artery: Secondary | ICD-10-CM

## 2010-12-12 NOTE — Assessment & Plan Note (Signed)
OFFICE VISIT  MOLZAHN, Aaylah MAE DOB:  1946-06-02                                       12/09/2010 EAVWU#:[id number]  This is an established patient follow-up.  HISTORY OF PRESENT ILLNESS:  This is a 65 year old patient who presents status post a left renal artery stenting complete on November 28, 2010. Since then the patient notes some bruising at the site of the cannulation, otherwise no other complaints.  Her blood pressures have stable.  No changes in medication.  Also, no change in her renal function at this point.  Her past medical history, past surgical history, social history, family history, medications are reviewed, and allergies are all unchanged from previous.  PHYSICAL EXAMINATION:  She had a blood pressure of 134/77, respirations were 12, heart rate 54.  Temperature was 98.2.  On focused exam of the right groin, there is no evidence of any pseudoaneurysms or hematoma at this point.  There are some ecchymoses in the right medial thigh. Abdomen:  No tenderness.  Obese, soft abdomen.  No obvious masses.  No obvious bruits over the renal arteries.  MEDICAL DECISION MAKING:  A 65 year old patient underwent a successful right renal artery stenting.  In 3 months, I would repeat her renal duplex study.  Also, in 3 months, I would recheck the right common iliac artery aneurysm, which was not done at the last visit.    Fransisco Hertz, MD Electronically Signed  BLC/MEDQ  D:  12/09/2010  T:  12/12/2010  Job:  [id number]

## 2010-12-15 ENCOUNTER — Other Ambulatory Visit: Payer: Self-pay | Admitting: *Deleted

## 2010-12-15 ENCOUNTER — Ambulatory Visit: Payer: Medicare Other | Admitting: Family Medicine

## 2010-12-15 MED ORDER — SIMVASTATIN 20 MG PO TABS: 20.0000 mg | ORAL_TABLET | Freq: Every day | ORAL | Status: DC

## 2010-12-20 ENCOUNTER — Ambulatory Visit: Payer: Medicare Other | Admitting: Physical Medicine & Rehabilitation

## 2010-12-20 NOTE — Assessment & Plan Note (Signed)
Ms. Favaro returns today.  She underwent a left L5 dorsal ramus  injection, left L4 medial branch and left L3 medial branch block under  fluoroscopic guidance.  The patient has had excellent relief of pain.  She did not fill her Tylenol No. 3.  Her pain is down to near zero and  returns to her back, averaging about 1.  She rarely uses a walker to  ambulate due to her Charcot-Marie-Tooth neuropathy.  Her more urgent  issue is dizziness and headaches for last 2 days.   In terms of pain, it is mainly in the knees and feet and again as noted  above, it is not very severe.   REVIEW OF SYSTEMS:  Positive for numbness and tingling in the feet,  trouble walking and dizziness, night sweats, limb swelling, urine  retention, shortness of breath.  She has been in contact with Dr. Cathey Endow.  He is going to be picking up hydrochlorothiazide or some other type of  diuretic prescription today.   PAST MEDICAL HISTORY:  Diabetes, thyroid, hypertension, arthritis, as  well as her Charcot-Marie-Tooth, and her lumbar facet syndrome.   Her Oswestry disability index score is a 54%.   PHYSICAL EXAMINATION:  Blood pressure 194/114, pulse 95, weight 209  pounds.   Mood and affect are appropriate.  Her back has no tenderness to  palpation.  She has good forward flexion and extension holding onto a  walker.  Her lower extremity motion is good.  Strength is good in the  proximal in the hip flexors and knee extensors, but has 2- ankle  dorsiflexor strength on left and 4 on the right, but this is baseline.   Blood pressure in office today 194/114, pulse 95.   IMPRESSION:  1. Lumbar facet syndrome, lumbar spondylosis without myelopathy,      responsive to medial branch blocks, continued relief x1 month.  I      will see her back in 2 months.  We will see how long she has      relief.  Should she start having more disabling pain, she can come      in prior to that time and we can reinject.  2. In terms of  hypertension, we have called upstairs to Dr. Ovidio Kin      office.  She will be heading up there after this appointment to get      checked there.   The patient really not taking any pain medicines at this point.  We will  not write another one.      Erick Colace, M.D.  Electronically Signed     AEK/MedQ  D:  10/09/2008 11:18:04  T:  10/10/2008 01:45:28  Job #:  [id number]   cc:   Seymour Bars, D.O.  [facility].  [address]  [place], [place] [address]

## 2010-12-20 NOTE — Assessment & Plan Note (Signed)
[facility]                            CARDIOLOGY OFFICE NOTE   [name], [name]                       MRN:          [id number]  DATE:10/30/2007                            DOB:          Apr 11, 1946    [name] is a 65 year old female who has a history of diabetes,  hypertension, hyperlipidemia who I initially saw on August 29, 2006  secondary to an iliac aneurysm.  At that time, it was noted that she had  2 cm right common iliac artery aneurysm when she had a CT of her spine.  We did perform arterial Dopplers on August 31, 2006, and showed no  significant arterial occlusive disease.  Since I last saw her, she does  have some dyspnea on exertion which has been a chronic issue.  There is  no orthopnea, PND, pedal edema, palpitations, pre-syncope, syncope,  chest pain.  No claudication.   PRESENT MEDICATIONS:  1. Zocor 20 mg p.o. daily.  2. Lisinopril HCT 20/25 daily.  3. Tramadol t.i.d.  4. TUMS daily.  5. Baby aspirin 81 mg daily.  6. Gabapentin 300 mg p.o. t.i.d.  7. Synthroid 100 mcg daily.   PHYSICAL EXAM:  Shows a blood pressure 124/83 and pulse is 85.  She  weighs 215 pounds.  HEENT:  Normal.  NECK: Neck is supple.  CHEST:  Clear.  CARDIOVASCULAR:  Regular rate and rhythm.  ABDOMEN:  Shows no tenderness.  EXTREMITIES:  Show no edema.  Note, I can palpate no masses in her right  groin or left groin.  There are no bruits noted.   Her electrocardiogram shows sinus rhythm at a rate of 77.  The axis is  normal.  There are no significant ST changes.   DIAGNOSES:  1. Right iliac artery aneurysm - We will plan to proceed with a CTA to      size her aneurysm.  If it ever reaches 2.5 to 3 cm then she may      require surgical intervention.  2. Diabetes mellitus - Management per Dr. Cathey [name].  3. Hypertension - Her blood pressure appears to be adequately      controlled on present medications.  4. Hyperlipidemia - She will continue on her  statin and follow-up Dr.      Cathey [name] concerning this issue.  5. Tobacco abuse - We discussed importance of discontinuing this.  6. Remote history of peptic ulcer disease.   I have given her risk factors on dyspnea.  We did discuss again a  Myoview today up.  She declined that in the past and again did that  today as she states she cannot have the finances. We will see her back  in 9 months.     [name] Frieze Jens Som, MD, [facility]  Electronically Signed    BSC/MedQ  DD: 10/30/2007  DT: 10/30/2007  Job #: [id number]   cc:   [name], D.O.

## 2010-12-20 NOTE — Discharge Summary (Signed)
NAMEGRAVIELA, [name]                  ACCOUNT NO.:  [id number]   MEDICAL RECORD NO.:  [id number]          PATIENT TYPE:  INP   LOCATION:  3735                         FACILITY:  [facility]   PHYSICIAN:  Madolyn Frieze. Jens Som, MD, FACCDATE OF BIRTH:  04-22-1946   DATE OF ADMISSION:  07/08/2008  DATE OF DISCHARGE:  07/09/2008                               DISCHARGE SUMMARY   PROCEDURES:  1. 2-D echocardiogram.  2. Adenosine Myoview.   PRIMARY FINAL DISCHARGE DIAGNOSIS:  Syncope.   SECONDARY DIAGNOSES:  1. Iliac artery aneurysm, 2.4 x 2 cm, in June 2009.  2. Hypertension.  3. Diabetes.  4. Hyperlipidemia.  5. Chronic obstructive pulmonary disease.  6. Tobacco use.  7. Hypothyroidism.  8. Remote history of peptic ulcer disease.  9. Status post cholecystectomy.  10.Fibroid tumor removal and knee replacement.  11.Obesity.   TIME AT DISCHARGE:  Thiry five minutes.   HOSPITAL COURSE:  [name] is a 65 year old female with no previous  history of coronary artery disease.  She is followed by Dr. Jens Som for  an iliac aneurysm.  She came to the hospital on the day of admission for  a syncopal episode lasting approximately 2 minutes.   She was monitored on telemetry overnight.  She had no arrhythmia.  An  adenosine Myoview was performed once her cardiac enzymes were negative,  and it showed mild breast attenuation artifact but no evidence of  ischemia and/or infarct and an EF of 73%.  A 2-D echocardiogram was  performed which showed an EF of 55-60% with mild diastolic dysfunction  and no significant valvular abnormalities.   Dr. Jens Som felt that if her echocardiogram and a Myoview were without  significant abnormality, she could be safely discharged home with close  outpatient followup and a  CardioNet monitor.  A message has been left  with the office for this.  [name] is considered stable for discharge  on July 09, 2008, with outpatient followup arranged.   DISCHARGE  INSTRUCTIONS:  1. Her activity level is to be increased gradually.  2. She was advised that Dr. Jens Som felt she should not drive.  3. She is to stick closely to a diabetic diet.  4. She is to call our office for any further episodes of syncope.  5. She is to follow up with Dr. Jens Som on July 22, 2008, at 2:00      p.m. and with Dr. Cathey Endow as needed.   DISCHARGE MEDICATIONS:  1. Lisinopril and hydrochlorothiazide is discontinued.  2. Lisinopril 20 mg daily.  3. Zocor 20 mg nightly.  4. Tramadol 100 mg t.i.d.  5. Tums p.r.n.  6. Aspirin 81 mg daily.  7. Gabapentin 300 mg t.i.d.  8. Synthroid 100 mcg daily.  9. Fluoxetine 10 mg daily.  10.Tylenol No. 3 p.r.n.      Theodore Demark, PA-C      Madolyn Frieze. Jens Som, MD, [facility]  Electronically Signed    RB/MEDQ  D:  07/09/2008  T:  07/10/2008  Job:  [id number]   cc:   Seymour Bars, D.O.

## 2010-12-20 NOTE — Assessment & Plan Note (Signed)
OFFICE VISIT   MEMPHIS, CRESWELL MAE  DOB:  1946-03-07                                       08/12/2010  XLKGM#:[id number]   This is a new patient consultation from Dr. Seymour Bars at [facility] in [place].   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  This patient recently underwent a CTA in July 2010 and  then a CAT scan in November 2011 which were concerning for a right  common iliac aneurysm and possible left renal artery stenosis.  Additionally, there was a supposed aneurysm of the infrarenal aorta.  The patient at this point is completely asymptomatic.  She denies any  vascular claudication or rest pain.  Denies any pain in the pelvis or  any type of constipation.  She also notes that her hypertension is  stable at this point, with no further escalation.  She does note that  she has been under stress with some medical problems of her son recently  that she thinks is contributing to her elevated pressures at times.  She  has no family history of aneurysmal disease or connective tissue  disorders.  Her aneurysmal risk factors include smoking and also  hypertension and elevated age, at the age of 48.   PAST MEDICAL HISTORY:  Hypertension, hyperlipidemia, peptic ulcer  disease, Charcot-Marie-Tooth disease, osteoarthritis, neurogenic  bladder, peripheral neuropathy, diabetes, COPD, degenerative disk  disease, tobacco dependent, hypothyroidism, major depressive disorder,  obesity, lipoma, bilateral cataracts.   PAST SURGICAL HISTORY:  Left total knee replacement, laparoscopic  cholecystectomy, multiple cystectomies and lipectomies.   SOCIAL HISTORY:  She smokes 1/2 to 1 packet a day of cigarettes for a  total of 50 years.  Denies any alcohol or illicit drug use.   FAMILY HISTORY:  Father had coronary artery disease and lung  cancer.  Mother had Parkinson's disease.   MEDICATIONS:  Aspirin, Nitrostat, Synthroid, gabapentin, tramadol,  metoprolol, Zocor, Ambien, glyburide, Dexilant, acyclovir, Lasix and  metformin.   ALLERGIES:  She has no known drug allergies.   REVIEW OF SYSTEMS:  She noted change in hearing, productive cough,  arthritis, joint pain, shortness of breath, reflux.  Otherwise the rest  of her review of systems was noted to be negative.   PHYSICAL EXAMINATION:  Vital signs:  Blood pressure 167/82 in the left  arm, heart rate of 52, respirations were 12, satting 97%.  General:  She is well-developed, obese, in no apparent distress.  Head:  Normocephalic, atraumatic.  ENT: Hearing is grossly intact.  Oropharynx without any erythema or  exudate.  The nares had no erythema and no drainage.  Eyes:  Pupils were equal, round, reactive to light.  Extraocular  movements were intact.  The left eye looked to have a little bit of  clouding in the eye.  Neck:  She had a supple neck without any nuchal rigidity.  There is no  JVD.  Cardiac:  Regular rate and rhythm.  Normal S1-S2.  No murmurs, rubs,  thrills or gallops.  Vascular:  She had easily palpable upper extremity  pulses.  Carotids were palpable without any bruits.  She had palpable  bilateral femoral arteries.  Could not On the right leg she had 2+  dorsalis  pedis, 1+ posterior tibial.  On the left side both the dorsalis  pedis and posterior tibial were 1+.  Abdomen:  Mildly obese, soft, nontender, nondistended guarding.  Normal  bowel sounds.  No masses.  No guarding or rebound.  Musculoskeletal:  She had 5/5 strength in all extremities.  Both sets of  toes on both feet were cyanotic as was her left hand, there was also  cyanosis.  Both feet were cool to touch despite having present pulses.  Neuro:  Cranial nerves II-XII were intact.  Motor was as above.  In the  right leg, she is anesthetic up to level of her right ankle.  In her  left leg  she is anesthetic all the way up to mid shin.  Psych:  Judgment was intact.  Her mood and affect were appropriate for  clinical situation.  Skin:  She did not obviously have rashes elsewhere.  Lymphatics:  She had no cervical, axillary or inguinal lymphadenopathy.   I reviewed outside documents, about 10 pages worth.  It includes  readings of previous ultrasounds and CTAs.  I then reviewed the CTA of  the pelvis that was performed in August 2011.  This demonstrates an  ectatic aorta that is tortuous and also a common iliac artery aneurysm  that is 2.4 cm on the reconstructed images.  There also is a suggestion  of some degree of left renal artery stenosis on this CAT scan.  There is  an extensive amount of calcification throughout this patient's vascular  system.   MEDICAL DECISION MAKING:  This is a 65 year old female with multiple  comorbidities that at this point has no symptomatology from her iliac  artery aneurysm.  Additionally, she has hypertension, but is not clear  whether or not she truly has renal artery stenosis.  The limitations of  the CTA are the inability to obtain a true transverse measurement of  both the aorta and also the iliac artery.  I would proceed forward with  aortoiliac duplex to try to get a more accurate measurement of this  artery.  Also it would allow better visualization of the flow pattern  within this aneurysm to verify no presence of thrombus.  If she develops  thrombus within this aneurysm, I would proceed forward with repair  earlier than the usual 3.5-cm threshold.  In regards to the renal artery  stenosis, I would undergo at the same time a renal duplex ultrasound to  evaluate it for greater than 60% stenosis of the left renal artery.  Given this patient's multiple comorbidities, her problems are best  managed from an endovascular viewpoint.  I do not think that performing  open surgery on this patient with multiple comorbidities is recommended.   She appears more fragile than her stated age.  I explained to her that  the #1 risk for progression of aneurysmal diseases are smoking and that  this has been verified in multiple studies and that she needs to make a  concerted effort to cease smoking.  She notes she is going to make  attempt on her own to reduce her smoking and that if she is not able to  at this point, she will request assistance for possible pharmacologic or  counseling to help her with smoking.  We are going to get these  noninvasive studies completed within the next week, and then she is  going to follow up at that point and I will decide on any further  interventions that will  be necessary for her.   Additionally, this patient demonstrates findings consistent with  Raynaud's phenomen.  The patient's symptoms from this are minor, so I  don't believe an extensive work-up would be of any value.  The patient  agrees to forgo the work-up at this time.     Fransisco Hertz, MD  Electronically Signed   BLC/MEDQ  D:  08/12/2010  T:  08/15/2010  Job:  [id number]   cc:   Seymour Bars, D.O.

## 2010-12-20 NOTE — Assessment & Plan Note (Signed)
[name] returns today.  She was last seen by me on January 22, 2009.  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  She is using a left AFO for the most part but today not  wearing it because of the heat.  She is starting to get some back pain  recurrence.  She had about a 26-month relief with facet medial branch  blocks.  Her pain has just been increasing the last week or two.   Pain level is at 5 which is basically where it was last visit.  She can  walk 30 minutes at a time with a rolling walker.  She does not climb  steps.  She drives.   Intercurrent medical history is otherwise negative.   PHYSICAL EXAMINATION:  Her back has pain with extension in the lumbar  spine but relieved with flexion.  No tenderness to palpation of the  lumbar paraspinals.  Her lower extremity strength is good in the knee  extensors; however, in the ankle dorsiflexor it is 1+ on the left and 2-  on the right.  She has decreased sensation in bilateral feet up to about  midcalf region.   IMPRESSION:  1. Lumbar spondylosis  2. Hereditary polyneuropathy.   PLAN:  1. Continue Tylenol No. 3.  She takes one a day on average.  2. Set her up for repeat medial branch blocks in [place].  3. Continue tramadol 50 mg 1 p.o. t.i.d.  4. Continue gabapentin 100 mg p.o. b.i.d.   She will be scheduled at [facility] for medial branch blocks on  April 15, 2009.  She will have a driver.      Erick Colace, M.D.  Electronically Signed     AEK/MedQ  D:  03/12/2009 11:44:54  T:  03/12/2009 23:54:37  Job #:  [id number]

## 2010-12-20 NOTE — Assessment & Plan Note (Signed)
A 65 year old female with history of hereditary demyelinating neuropathy  in bilateral lower extremities causing some ankle dorsiflexor weakness,  walks with a walker.  She has a history of TKR 13 years ago developing  some increased pain in the suprapatellar area, some clicking noted.   She also has back pain which was relieved with lumbar facet medial  branch blocks.  Her back pain has improved since her February 2010  injections.   No falls.   Pain level 5/10 and worse in the left knee.  She is not climbing steps.  She does drive.  She can walk 10 minutes at a time.  She needs assist  with shopping and household duties.   REVIEW OF SYSTEMS:  Positive for trouble walking, limb swelling,  shortness of breath.  Blood sugar regulation problems.  She does see her  primary care physician, Dr. Seymour Bars as well as Dr. Jens Som from  Cardiology.   SOCIAL HISTORY:  Widowed.  Smokes half pack per day.   FAMILY HISTORY:  Heart disease, diabetes, high blood pressure.   Blood pressure 174/87, pulse 65, weight 207 pounds.  Well-developed,  well-nourished female, in no acute stress.   She has 2/5 bilateral ankle dorsiflexor strength, 4/5 bilateral knee  extensor, and 5/5 in the hip flexor.  She has clicking with passive  flexion and extension of the left knee.  No evidence of knee effusion.  No erythema and only mildly tender to palpation just above the patellar  area.  No pain with resisted extension of the knee.   Her back has good range of motion, has about 75% forward flexion, and 50-  75% extension limited mainly by her balance.   IMPRESSION:  1. Lumbar spondylosis, still good reflex from medial branch blocks.  2. Left knee pain, 13 years post total knee replacement.  Exam      consistent with some clicking, may be having some loosening of      prosthesis.  We will check 2-view of the left knee and followup      results.  3. I will see him back couple months because of her knee  pain.  We      will reinstitute her Tylenol 3 and give her 45 tablets per month.      She takes 1-2 per day, not exceed 45 for a month.  I have also      written for her tramadol 1      p.o. t.i.d. and gabapentin 100 mg p.o. t.i.d. 44-month supplies on      each of these.  If her back injections starts wearing off, she can      call back and schedule another one.      Erick Colace, M.D.  Electronically Signed     AEK/MedQ  D:  01/22/2009 12:36:54  T:  01/23/2009 03:44:21  Job #:  [id number]   cc:   Seymour Bars, D.O.  [facility].  [address]  [place], [place] [address]   Madolyn Frieze. Jens Som, MD, [facility]  [address]  [address]  [place]  [place] [address]

## 2010-12-20 NOTE — Assessment & Plan Note (Signed)
[name] returns today.  She is a 65 year old female with Charcot-Marie-  Tooth type 1.  She has a history of lower extremity numbness, tingling,  as well as balance issues.  She has a foot drop and has not used AFO,  but does use a walker.   In terms of her pain, she continues to have low back pain as well as  neuropathy related pain.  She is on Tylenol No. 3 one p.o. q.i.d.  She  is on tramadol 2 p.o. t.i.d.  She did run out of this, however.  She is  on Neurontin 400 mg t.i.d., has a 51-month supply of this with 1 refill.   She can walk 15-20 minutes at a time.  She drives.   REVIEW OF SYSTEMS:  Positive for urinary retention, limb swelling, and  coughing.  She does have some thigh pain at times.  She has no hand  numbness or tingling.   PHYSICAL EXAMINATION:  Her blood pressure is 155/93.  She does follow up  with Dr. Cathey Endow as well as Dr. Jens Som.  She has a pulse of 92, her  weight of 210 pounds, which is higher than prior value, (May 2008, she  was 205).   Her motor strength is 2- left ankle dorsiflexor and 3- right ankle  dorsiflexor.  Her hip flexor and knee extensor strength are at least 4+  bilaterally, upper extremities 5/5 bilaterally.  Sensation is reduced to  sharp at the malleolar level and distal.  She has intact proprioception,  however.  Upper extremity sensation is normal.   In terms of her back, she is complaining of more back pain.  She wishes  me to focus on this as well.  She has tenderness to palpation of  lumbosacral junction.  She has pain with extension of the lumbar spine  and has limited extension, but this is somewhat due to her balance,  forward flexion is at least 50-75%.  She has no pain over the hips.  She  has good hip internal and external rotation.   IMPRESSION:  1. 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  We will      continue her gabapentin, reinstitute her tramadol, continue on      Tylenol No. 3.  2. Chronic low back  pain.  She has had MRI of the lumbar spine dated      April 04, 2008, showing a potential pars defect at L5-S1, some      bilateral L5 nerve root encroachment, but she really has no L5      radicular symptoms.  She has facet hypertrophy at L5-S1, worse on      the left than on the right, mild facet hypertrophy at L3-L4 and L4-      L5, left greater than right.  3. We will go ahead and set her up with lumbar medial branch blocks.  4. We will continue current medications.  5. Ankle-foot orthosis for the left lower extremity.  I called      physical therapy and discussed the situation with them, they will      call and [facility]  as well.      Erick Colace, M.D.  Electronically Signed     AEK/MedQ  D:  08/21/2008 12:12:04  T:  08/22/2008 01:26:32  Job #:  [id number]   cc:   Laretta Alstrom

## 2010-12-20 NOTE — Assessment & Plan Note (Signed)
 HEALTHCARE                            CARDIOLOGY OFFICE NOTE   [name], [name]                       MRN:          [id number]  DATE:07/22/2008                            DOB:          [birth year]    HISTORY OF PRESENT ILLNESS:  Ms. [name] is a very pleasant 65 year old  female that I recently saw on July 08, 2008.  She had presented to  the office and had a syncopal episode that day.  She was admitted to the  hospital and cardiac enzymes were negative.  A Myoview showed breast  attenuation, but no ischemia or infarction, and her ejection fraction  was 73%.  An echocardiogram showed normal LV function.  She was given a  CardioNet monitor and was discharged home.  Since then, she does have  dyspnea on exertion, which is a chronic issue and most likely secondary  to COPD.  There is no orthopnea, PND, palpitations, presyncope, syncope  or exertional chest pain.  She occasionally has mild pedal edema.  She  does continue to smoke.   MEDICATIONS:  Include  1. Zocor 20 mg p.o. daily.  2. Tramadol.  3. Baby aspirin 81 mg p.o. daily.  4. Gabapentin.  5. Synthroid 100 mcg p.o. daily.  6. Fluoxetine.  7. Lisinopril 20 mg p.o. daily.   PHYSICAL EXAMINATION:  VITAL SIGNS:  Shows a blood pressure of 144/73  and pulse is 71.  She weighs 216 pounds.  HEENT:  Normal.  NECK:  Supple.  CHEST:  Clear.  CARDIOVASCULAR:  Reveals regular rhythm.  ABDOMEN:  Shows no tenderness.  EXTREMITIES:  Show no edema.   Her electrocardiogram shows a sinus rhythm at a rate of 71.  The axis is  normal.  There are no significant ST changes noted.   DIAGNOSES:  1. Recent syncopal episode - The etiology of this is unclear.  Note      her left ventricular function is normal and there is no ischemia.      She will continue with her CardioNet monitor.  If there is no      significant arrhythmia, once this is completed, then we will allow      her to return to driving.  Some  of her episodes may have been      either orthostatic or vagal.  2. Iliac artery aneurysm - She will need a followup CTA in June 2010.  3. Hypertension - She will continue on her present blood pressure      medicines.  4. Hyperlipidemia - She will continue on her statin.  5. Chronic obstructive pulmonary disease.  6. Tobacco abuse - We discussed importance of discontinuing this for      between 3-10 minutes.  7. Hypothyroidism.  8. Remote history of peptic ulcer disease.   I will see her back in 2-4 weeks.     Madolyn Frieze Jens Som, MD, [facility]  Electronically Signed    BSC/MedQ  DD: 07/22/2008  DT: 07/23/2008  Job #: [id number]

## 2010-12-20 NOTE — Assessment & Plan Note (Signed)
[facility] HEALTHCARE                            CARDIOLOGY OFFICE NOTE   [name], [name]                       MRN:          [id number]  DATE:08/12/2008                            DOB:          05-Jan-1946    Ms. Krage is a pleasant female who had a recent syncopal episode.  A  Myoview showed no ischemia or infarction, but only breast attenuation  and normal LV function.  An echocardiogram also showed normal LV  function.  We recently did a CardioNet monitor that showed significant  arrhythmia.  Since I last saw her on July 22, 2008, she has had no  further syncope.  She has had no chest pain.  She does have some dyspnea  on exertion which is a chronic problem and unchanged.   MEDICATIONS:  1. Zocor 20 mg p.o. nightly.  2. Tramadol t.i.d.  3. Baby aspirin 81 mg p.o. daily.  4. Gabapentin.  5. Synthroid 100 mcg p.o. daily.  6. Lisinopril 20 mg p.o. daily.  7. Fish oil.  8. Vitamin D.  9. Calcium.   PHYSICAL EXAMINATION:  VITAL SIGNS:  Blood pressure of 163/95 and the  pulse is 89.  She weighs 209 pounds.  HEENT:  Normal.  NECK:  Supple.  CHEST:  Clear.  CARDIOVASCULAR:  Regular rhythm.  ABDOMEN:  No tenderness.  EXTREMITIES:  No edema.   DIAGNOSES:  1. Recent syncopal episode - The patient has had no further episodes.      Her left ventricular function is normal and she has had no      ischemia.  Her CardioNet monitor showed no significant arrhythmia.      This certainly could have been an orthostatic issue or vagal issue.      We will not pursue this further unless she has recurrences.  I have      told her that she can begin driving again.  2. Iliac artery aneurysm - She will need a followup CTA in June 2010.      I will see her back at that time.  3. Hypertension - Her blood pressure is elevated today.  However, she      states this is typically controlled at home.  I have asked her to      track this and if her systolic blood pressure  runs greater than 140      or her diastolic greater than 85, then we can add additional      medications as needed.  4. Hyperlipidemia - She will continue on her statin.  5. Chronic obstructive pulmonary disease.  6. Tobacco abuse - We discussed the importance of discontinuing this.  7. Hypothyroidism.  8. Remote history of peptic ulcer disease.   We will see her back in 6 months.     Madolyn Frieze Jens Som, MD, [facility]  Electronically Signed    BSC/MedQ  DD: 08/12/2008  DT: 08/13/2008  Job #: [id number]   cc:   Seymour Bars, D.O.

## 2010-12-20 NOTE — Assessment & Plan Note (Signed)
A 65 year old female with Charcot-Marie-Tooth.  She returns today.  I  last saw her about 6 months ago.  In the interval time, she has been  complaining more about back pain.  She has had pain that interferes with  her activities.  She in fact takes the Tylenol with Codeine for back  pain, and she uses gabapentin for her lower extremity pain.  Back pain  has been going on for the last several months.  She has had no history  of  trauma.  She has a history of lumbar facet arthropathy in L3-S1 but  no recent scans.  She has had no fevers or weight loss but she has had  some night sweats.  She does have diabetes.   She also has some shortness of breath and coughing.   She is on disability.  Her Oswestry Disability Index Score is 48%,  putting her in the severe range.   This is a combination of both her back and her lower extremity problems.   PHYSICAL EXAMINATION:  GENERAL:  No acute distress.  Mood and affect  appropriate.  VITAL SIGNS:  Her blood pressure is 130/84, pulse 85, and weight 215  pounds.  EXTREMITIES:  She has tenderness to palpation in the lumbar paraspinals.  Pain with extension greater than flexion.  Lower extremity strength  basically 0/5 in the toe flexors, extensors, trace in the ankle  dorsiflexor on the left and on the right side she has 2+ in the toe  flexor, extensors and 3- in the ankle dorsiflexor on the right.  Knee  extension strength is 5/5, upper extremity strength is 5/5.  Sensation  is decreased below the knees bilaterally but worse on the left side than  right side.  Upper extremities are intact.  Proprioception is reduced on  the left side and intact on the right side.  She has good pulses.  She  does have some mild cyanosis of the feet, and the skin temperature is  only mildly reduced.   IMPRESSION:  1. Charcot-Marie-Tooth disease.  2. Lumbar pain.  She has a history of facet syndrome, had not had any      imaging studies for long time.  She has had  some worsening of her      symptomatology, complaint with her lower extremity symptoms could      not rule out concomitant lumbar stenosis.  We will go ahead and      reimage her spine, and depending on results consider epidural      versus facet injections.  3. Continue Tylenol with Codeine #30 one p.o. q.i.d. p.r.n.  4. Continue tramadol 50 t.i.d.  5. Continue gabapentin 300 t.i.d.      Erick Colace, M.D.  Electronically Signed     AEK/MedQ  D:  03/27/2008 12:32:30  T:  03/28/2008 02:53:13  Job #:  [id number]   cc:   Seymour Bars, D.O.  [facility].  [address]  [place], [place] [address]   Madolyn Frieze. Jens Som, MD, [facility]  [address]  [address]  [place]  [place] [address]

## 2010-12-20 NOTE — Procedures (Signed)
[name], CONNELLY                  ACCOUNT NO.:  [id number]   MEDICAL RECORD NO.:  [id number]           PATIENT TYPE:   LOCATION:                                 FACILITY:   PHYSICIAN:  Erick Colace, M.D.DATE OF BIRTH:  [birth year]   DATE OF PROCEDURE:  DATE OF DISCHARGE:                               OPERATIVE REPORT   This is a left L5 dorsal ramus injection, left L4 medial branch block,  and bilateral L3 medial branch block under fluoroscopic guidance.   INDICATIONS:  Lumbar pain with facet arthropathy noted on the lumbar  imaging studies.  Pain is only partially responsive to medication  management or other conservative care.  She is taking tramadol 2 p.o.  t.i.d. as well as Tylenol #3 b.i.d. p.r.n.   Pain does interfere with self-care mobility.   Informed consent was obtained after describing risks and benefits of the  procedure with the patient.  These include bleeding, bruising, and  infection.  She elects to proceed and has given written consent.  The  patient was placed prone on the fluoroscopy table.  Betadine prep,  sterile drape, 25-gauge 1-1/2-inch needle was used to anesthetize the  skin and subcutaneous tissue with 1% lidocaine x2 mL and 22-gauge, 3-1/2-  inch spinal needle was inserted under fluoroscopic guidance targeting  the left S1 SAP, sacroiliac junction.  Bone contact was made and  confirmed with lateral imaging.  Omnipaque 180 x 0.5 mL demonstrated no  intravascular uptake, then followed by injection of 1 mL of solution  containing 1 mL of 4 mL of dexamethasone and 1 mL of 2% MPF lidocaine.  Then, left L4 SAP transverse process junction targeted.  Bone contact  was made.  Omnipaque 180 x 0.5 mL demonstrated no intravascular uptake  and 0.5 mL of dexamethasone-lidocaine solution was injected in the left  L4 SAP transverse process junction targeted.  Bone contact was made.  Omnipaque 180 x 0.5 mL demonstrated no intravascular uptake.  Then, 0.5  mL of  dexamethasone-lidocaine solution injected.  The patient tolerated  the procedure well.  Pre- and post-injection vitals were stable.  Post-  injection instructions were given.  Pre-injection pain level is 6-7/10  and post-injection is 0/10.      Erick Colace, M.D.  Electronically Signed     AEK/MEDQ  D:  09/10/2008 11:47:41  T:  09/11/2008 01:21:14  Job:  [id number]

## 2010-12-20 NOTE — H&P (Signed)
[name], [name]                  ACCOUNT NO.:  [id number]   MEDICAL RECORD NO.:  [id number]          PATIENT TYPE:  REC   LOCATION:  [facility]                         FACILITY:  [facility]   PHYSICIAN:  Madolyn Frieze. Jens Som, MD, FACCDATE OF BIRTH:  10-Mar-1946   DATE OF ADMISSION:  06/19/2008  DATE OF DISCHARGE:                              HISTORY & PHYSICAL   [name] is a 65 year old female with past medical history of an iliac  artery aneurysm, hypertension, diabetes mellitus, hyperlipidemia, COPD  who presents for evaluation of syncope.  I have followed the patient in  our [facility] secondary to an iliac artery aneurysm.  She  presented for followup today.  She states that this morning she stood to  go into the kitchen.  She did walk into the kitchen, had been working  for approximately 5 minutes, and then had sudden dizziness.  There was  no associated chest pain, shortness of breath, palpitations, nausea,  vomiting, seizure activity, loss of sensation or strength in any  particular extremity.  She then had a frank syncopal episode and fell.  She did not suffer trauma.  She states that she was out for 2-3 minutes  by her report.  She has felt fine since then.  Again, there was no  trauma.  She does have some chronic dyspnea on exertion, but she  attributes this to her COPD.  She does not have orthopnea, PND, or  exertional chest pain.  Of note, she has had several syncopal episodes  in her life.  She does not recall the details of these, but her last one  was probably 6-7 years ago.   PRESENT MEDICATIONS:  1. Zocor 20 mg p.o. nightly.  2. Lisinopril/hydrochlorothiazide 20/25 mg p.o. daily.  3. Tramadol t.i.d.  4. Tums as needed.  5. Baby aspirin 81 mg p.o. daily.  6. Gabapentin 300 mg p.o. t.i.d.  7. Synthroid 100 mcg p.o. daily.  8. Fluoxetine 10 mg p.o. daily.  9. Tylenol With Codeine No. 3 as needed.   She has no known drug allergies.   SOCIAL HISTORY:  She has a long  history of tobacco use.  She does not  consume alcohol.   The family history is positive for coronary artery disease.   Her past medical history is significant for diabetes, hypertension, and  hyperlipidemia.  She also has hypothyroidism as well as COPD.  She has  remote history of peptic ulcer disease.  She has had prior  cholecystectomy as well as cyst and fibroid tumors removed from her neck  and foot.  She has had a prior knee replacement.   REVIEW OF SYSTEMS:  She denies any headaches, fevers, or chills.  There  is no productive cough or hemoptysis.  There is no dysphagia,  odynophagia, melena, or hematochezia.  There is no dysuria or hematuria.  There is no rash or seizure activity.  There is no orthopnea, PND, but  she can occasionally have mild pedal edema.  The remaining systems are  negative.  Note, she does have a peripheral neuropathy.   PHYSICAL  EXAMINATION:  Today shows a blood pressure of 124/77, and her  pulse is 81.  She weighs 214 pounds.  She is well-developed and somewhat  obese.  She is no acute distress at present.  Her skin is warm and dry.  She does not appear to be depressed.  There is no peripheral clubbing.  Her back is normal.  The HEENT is normal eyelids.  Her neck is supple  with a normal upstroke bilaterally.  No bruits noted.  There is no  jugular venous distention.  I cannot appreciate thyromegaly.  Chest is  clear to auscultation with expansion.  Cardiovascular exam reveals  regular rate and rhythm, normal S1 and S2.  There are no murmurs, rubs,  or gallops noted.  Abdominal exam is nontender, nondistended.  Positive  bowel sounds.  No hepatosplenomegaly, no masses appreciated.  There is  no abdominal bruit.  She has 2+ femoral pulses bilaterally.  No bruits.  Extremities showed no edema that I can palpate.  No cords.  She has 2+  dorsalis pedis pulses bilaterally.  Neurological exam is grossly intact.   Her electrocardiogram shows a sinus rhythm at  a rate of 77.  The axis is  normal.  There are no ST changes noted.  Note, the QT is not prolonged.   DIAGNOSES:  1. Syncopal episode - Etiology of Ms. Harnack's syncope is unclear.  I      wonder if it may have been orthostatic in nature.  We will plan to      admit to telemetry and monitor for 24 hours.  We will also cycle      enzymes to include infarct, although I think this is unlikely      particularly in light of her normal electrocardiogram.  We will      check orthostatics.  I will also check an echocardiogram tomorrow      morning as well as a Myoview.  If the above is normal, then we will      plan an outpatient CardioNet monitor.  The patient will have to      avoid driving until her cardiac workup is complete.  2. Iliac artery aneurysm - Her most recent CTA was performed on January 23, 2008.  At that time, she had a 2.4 x 2 cm distal right common      iliac artery aneurysm.  This will need to be followed in the future      with a repeat in June of this year.  3. Diabetes mellitus - We will check CBGs.  4. Hypertension - Blood pressure adequately controlled on present      medications.  5. Hyperlipidemia - She will continue her statin.  6. Tobacco abuse - Discussed the importance of discontinuing this.  7. Remote history of peptic ulcer disease.   We will make further recommendations once we have her above studies.      Madolyn Frieze Jens Som, MD, [facility]  Electronically Signed     BSC/MEDQ  D:  07/08/2008  T:  07/09/2008  Job:  [id number]

## 2010-12-23 NOTE — Assessment & Plan Note (Signed)
65 year old female with hereditary neuropathy Charcot-Marie-Tooth  type I. Has been maintained on Ultram 1 p.o. t.i.d.  She has had a flare-  up of the pain since I last saw her in January and overall her pain is a  little less controlled.  She rates her pain as 5-7 out of 10 compared  with 3 out of 10 in January.  She has had no new medical complications  in interval time.  She continues on Neurontin as well.   She still drives, uses her cane, and sometimes a walker.  She has  difficulty getting the walker into the car.  We discussed [employer]  crutches but she is not interested in it.   EXAMINATION:  GENERAL:  No acute distress, mood and affect appropriate.  BACK:  There is tenderness to palpation lumbosacral junction, pain with  extension greater than with flexion.  Has decreased balance, needs to hold onto the table.  She has 3- right  ankle dorsiflexor and 1 on the left.  Four minus knee extension, hip  flexion.  Upper extremity strength is 5.  She has decreased sensation  below the ankle bilateral feet.  Intact in the hands.   IMPRESSION:  Charcot-Marie-Tooth peripheral neuropathy causing balance  disorder.  1. Lumbar facet arthropathy L3-S1.   Pain control is not as optimal as last visit.  Will increase the Ultram  to 2 tablets t.i.d. and continue current Neurontin.  I will see the  patient back in 2 months, follow up on her medication changes.      Erick Colace, M.D.  Electronically Signed     AEK/MedQ  D:  12/07/2006 12:01:09  T:  12/07/2006 13:21:14  Job #:  [id number]   cc:   Seymour Bars, D.O.  [facility].  [address]  [place], [place] [address]

## 2010-12-23 NOTE — Assessment & Plan Note (Signed)
[facility]                            CARDIOLOGY OFFICE NOTE   NADRA, HRITZ                       MRN:          [id number]  DATE:08/29/2006                            DOB:          [birth year]    [name] is a very pleasant 65 year old female with the past medical  history of diabetes mellitus, hypertension, hyperlipidemia, whom we are  asked to evaluate for an iliac aneurysm.  The patient states that she  recently moved to [place] in the past one and a half to two years.  She previously resided in [place].  Approximately four to five  years ago, she did have a stress test in [place], due to chest  pain.  However, she states she was under a significant amount of stress  at that point, as her husband had been diagnosed with a terminal illness  and she was taking care of him and her house.  Apparently, that was  unremarkable.  She typically has mild dyspnea with more extreme  activities, but not with routine activities around the house.  There is  no orthopnea, PND, pedal edema, palpitations, presyncope, syncope, or  exertional chest pain.  She recently had a CT to evaluate a back  disorder.  She was found to have a 2 cm iliac aneurysm on the right,  although I do not have those records available.  We were asked to  further evaluate.  Note:  She does not have claudication.   PRESENT MEDICATIONS INCLUDE:  1. Zocor 20 mg p.o. q.h.s.  2. Lisinopril/HCT 20/25 mg tablets 1 p.o. daily.  3. Synthroid 25 micrograms p.o. daily.  4. Tramadol t.i.d.  5. Gabapentin b.i.d.  6. Tums.  7. Baby aspirin 81 mg p.o. daily.   ALLERGIES:  She has no known drug allergies.   SOCIAL HISTORY:  She does smoke, but she does not consume alcohol.   FAMILY HISTORY:  Positive for coronary artery disease.   PAST MEDICAL HISTORY:  Significant for diabetes mellitus, hypertension  and hyperlipidemia.  She also has hypothyroidism.  She has a remote  history of peptic ulcer disease.  She has had a prior cholecystectomy  and cysts and fibroid tumors removed from her neck and foot.  She has  had a prior knee replacement.   REVIEW OF SYSTEMS:  She denies any headaches, fevers or chills.  There  is no productive cough or hemoptysis.  There is no dysphagia,  odynophagia, melena or hematochezia.  There is no dysuria or hematuria.  There is no rash or seizure activity.  There is no orthopnea, PND or  pedal edema.  She does have a peripheral neuropathy.  This causes pain  with ambulation and she has some difficulty with that.  Remaining  systems are negative.   PHYSICAL EXAMINATION TODAY:  Shows a blood pressure of 120/83 and her  pulse is 77.  She weighs 198 pounds.  She is well-developed and somewhat  obese.  She is in no acute distress.  She appears to be mildly depressed  as this is the anniversary of her father's  death.  SKIN:  Warm and dry.  There is no peripheral clubbing.  BACK:  Her back is normal.  HEENT:  Unremarkable with normal eyelids.  NECK:  Supple with normal upstroke bilaterally.  I cannot appreciate  bruits.  There is no jugular venous distention and no thyromegaly is  noted.  CHEST:  Clear to auscultation, normal expansion.  CARDIOVASCULAR EXAM:  Reveals a regular rate and rhythm, normal S1 and  S2.  There are no murmurs, rubs or gallops noted.  Her PMI is  nondisplaced.  ABDOMINAL EXAM:  Nontender, nondistended.  Positive bowel sounds, no  hepatosplenomegaly, no masses appreciated.  There is no abdominal bruit.  She has 2+ femoral pulses bilaterally and I cannot palpate a pulsatile  mass.  There is no bruit noted.  EXTREMITIES:  Show no edema and I can palpate no cords.  She has 2+  dorsalis pedis pulses bilaterally.  NEUROLOGIC EXAM:  Grossly intact.   Her electrocardiogram is somewhat suboptimal, as there is significant  baseline artifact.  However, there appears to be a sinus rhythm with  mild right axis  deviation.  There are no significant ST changes noted.   DIAGNOSES:  1. A 2 cm right iliac aneurysm.  2. Diabetes mellitus.  3. Hypertension.  4. Hyperlipidemia.  5. Tobacco abuse.  6. Remote history of peptic ulcer disease.   PLAN:  [name] presents for evaluation of an iliac aneurysm.  It  apparently has been measured at 2 cm by CT scan.  We will try to obtain  those results for our records.  I will schedule her to have an  ultrasound of that area and ABIs.  If we can see it with ultrasound,  then she will need serial studies in the future.  If she gets to the  size of 2.5 to 3 cm, then she may require surgical intervention.  She  otherwise is on excellent medications.  Given her multiple risk factors,  including diabetes mellitus, hypertension, hyperlipidemia, tobacco abuse  and positive family history, we did discuss a Myoview for risk  stratification today.  However, she is concerned about her finances and  would like to defer this at this point.  We can consider that in the  future.  I will see her back in approximately three months.     Madolyn Frieze Jens Som, MD, [facility]  Electronically Signed    BSC/MedQ  DD: 08/29/2006  DT: 08/29/2006  Job #: [id number]   cc:   Seymour Bars, D.O.

## 2010-12-23 NOTE — Procedures (Signed)
[name], [name]                  ACCOUNT NO.:  [id number]   MEDICAL RECORD NO.:  [id number]          PATIENT TYPE:  REC   LOCATION:  [facility]                          FACILITY:  [facility]   PHYSICIAN:  Erick Colace, M.D.DATE OF BIRTH:  1946-05-14   DATE OF PROCEDURE:  04/15/2009  DATE OF DISCHARGE:                               OPERATIVE REPORT   PROCEDURE:  Bilateral L5 dorsal ramus injection, bilateral L4 medial  branch block, bilateral L3 medial branch block under fluoroscopic  guidance.   INDICATIONS:  Lumbar spondylosis without myelopathy, has had good relief  with prior medial branch blocks which has lasted in fact 6 months and  has only recently recurred to a point of 6/10 pain which interferes with  shopping and household duties and activity only partially responsive to  medication management.   Informed consent was obtained after describing risks and benefits of the  procedure with include bleeding, bruising, and infection.  She elects to  proceed and has given written consent.  The patient placed prone on  fluoroscopy table.  Betadine prep, sterile drape, 25-gauge inch and half  needle was used to anesthetize the skin and subcutaneous tissue 1%  lidocaine x2 mL at each of 6 sites then a  22-gauge, 3-1/2 inch spinal  needle was inserted under fluoroscopic guidance targeting the left S1  SAP sacral iliac junction, bone contact made, confirmed with lateral  imaging.  Omnipaque 180 under live fluoro demonstrated no intravascular  uptake, then 0.1 mL of 4 mg/mL of dexamethasone and 2 mL of 2% MPF  lidocaine.  Then, a left L5 SAP transverse process junction targeted,  bone contact made.  Omnipaque 180 x 0.5 mL demonstrated no intravascular  uptake, then 0.5 mL of dexamethasone lidocaine solution injected.  The  left L4 SAP transverse process junction targeted, bone contact made.  Omnipaque 180 under live fluoro demonstrated no intravascular uptake  then 0.5 mL of dexamethasone  lidocaine solution was injected.  The same  procedure was repeated on the right side using same needle injectate and  technique.  The patient tolerated the procedure well.  Pre and post  injection vitals stable.  Post injection instructions given.      Erick Colace, M.D.  Electronically Signed     AEK/MEDQ  D:  04/15/2009 09:35:02  T:  04/16/2009 16:10:96  Job:  [id number]

## 2011-01-17 ENCOUNTER — Other Ambulatory Visit: Payer: Self-pay | Admitting: Family Medicine

## 2011-01-30 ENCOUNTER — Other Ambulatory Visit: Payer: Self-pay | Admitting: Family Medicine

## 2011-02-01 ENCOUNTER — Other Ambulatory Visit: Payer: Self-pay | Admitting: Family Medicine

## 2011-02-23 ENCOUNTER — Other Ambulatory Visit: Payer: Self-pay | Admitting: Family Medicine

## 2011-02-25 ENCOUNTER — Encounter: Payer: Self-pay | Admitting: Family Medicine

## 2011-02-27 ENCOUNTER — Ambulatory Visit (INDEPENDENT_AMBULATORY_CARE_PROVIDER_SITE_OTHER): Payer: Medicare Other | Admitting: Family Medicine

## 2011-02-27 ENCOUNTER — Encounter: Payer: Self-pay | Admitting: Family Medicine

## 2011-02-27 ENCOUNTER — Ambulatory Visit: Payer: Medicare Other | Admitting: Family Medicine

## 2011-02-27 VITALS — BP 153/70 | HR 52 | Wt 219.0 lb

## 2011-02-27 DIAGNOSIS — E538 Deficiency of other specified B group vitamins: Secondary | ICD-10-CM

## 2011-02-27 DIAGNOSIS — G629 Polyneuropathy, unspecified: Secondary | ICD-10-CM

## 2011-02-27 DIAGNOSIS — E119 Type 2 diabetes mellitus without complications: Secondary | ICD-10-CM

## 2011-02-27 DIAGNOSIS — E039 Hypothyroidism, unspecified: Secondary | ICD-10-CM

## 2011-02-27 DIAGNOSIS — M8580 Other specified disorders of bone density and structure, unspecified site: Secondary | ICD-10-CM

## 2011-02-27 DIAGNOSIS — J449 Chronic obstructive pulmonary disease, unspecified: Secondary | ICD-10-CM

## 2011-02-27 DIAGNOSIS — E785 Hyperlipidemia, unspecified: Secondary | ICD-10-CM

## 2011-02-27 DIAGNOSIS — I714 Abdominal aortic aneurysm, without rupture: Secondary | ICD-10-CM

## 2011-02-27 MED ORDER — METOPROLOL TARTRATE 100 MG PO TABS: 100.0000 mg | ORAL_TABLET | Freq: Two times a day (BID) | ORAL | Status: DC

## 2011-02-27 MED ORDER — BENAZEPRIL HCL 10 MG PO TABS: 10.0000 mg | ORAL_TABLET | Freq: Every day | ORAL | Status: DC

## 2011-02-27 MED ORDER — ALBUTEROL SULFATE HFA 108 (90 BASE) MCG/ACT IN AERS: 2.0000 | INHALATION_SPRAY | Freq: Four times a day (QID) | RESPIRATORY_TRACT | Status: DC | PRN

## 2011-02-27 MED ORDER — INSULIN GLARGINE 100 UNIT/ML ~~LOC~~ SOLN: 45.0000 [IU] | Freq: Every day | SUBCUTANEOUS | Status: DC

## 2011-02-27 NOTE — Patient Instructions (Signed)
Increase Lantus to 45 units each morning.  Stay on Metformin XR once daily with a meal.  Work on a LOW CARB/ HIGHER protein diet.  Update fasting labs. Will call you w/ results.  Start Benazepril 10 mg/ day to help reduce BP/ kidney protection.  REturn for f/u BP/ BMP in 6 wks.

## 2011-02-27 NOTE — Assessment & Plan Note (Signed)
Encouraged smoking cessation and a ProAir rescue inhaler sample given for prn use.

## 2011-02-27 NOTE — Assessment & Plan Note (Signed)
Complicated by neuropathy.  A1c unchanged at 8.5.  Will increase Lantus from 40 to 45 units once a day, continue metformin and update renal function today (recent renal artery stent done).  Add ACEi for both renal protection and HTN and recheck BMP in 6 wks.  She is due to schedule her diabetic eye exam.  reivewed her diet and she's eating mostly carbs, processed foods.  We discussed some necessary diet changes and if sugars have not improved by 6 wk f/u, I'd add back Amaryl 1 mg with breakfast.

## 2011-02-27 NOTE — Assessment & Plan Note (Signed)
On levothyroxine and due for TSH today.

## 2011-02-27 NOTE — Assessment & Plan Note (Signed)
She definitely needs f/u with Dr Jens Som.

## 2011-02-27 NOTE — Progress Notes (Signed)
  Subjective:    Patient ID: [name], female    DOB: Dec 05, 1945, 65 y.o.   MRN: [id number]  HPI  65 yo WF presents for f/u diabetes visit.  She had a renal artery stent placed by Dr Imogene Burn a few months ago.  She has not seen dr Jens Som back for her illiac aneurysm.  She is due for A1C and labs to recheck BUN/ Cr today.  She has fair dietary adherence on her diabetic diet but gets no exercise due to her neuropathy and Charcot foot.  Seeing Dr Jess Barters for pain mgmt.  AM fastings range from 150 to 250 depending on her diet.    She is on Lantus 40 u qAM, Metformin XR 1000 mg/ day.  Due to recheck TSH, on thyroid meds.    BP 153/70  Pulse 52  Wt 219 lb (99.338 kg)  SpO2 96%    Review of Systems  Constitutional: Positive for fatigue. Negative for appetite change and unexpected weight change.  Eyes: Negative for visual disturbance.  Respiratory: Positive for shortness of breath (continues to smoke, has COPD).   Cardiovascular: Positive for leg swelling. Negative for chest pain and palpitations.  Gastrointestinal: Negative for nausea and diarrhea.  Genitourinary: Negative for frequency.  Musculoskeletal: Positive for arthralgias.  Neurological: Positive for numbness.       Objective:   Physical Exam  Constitutional: She appears well-developed and well-nourished. No distress.       Obese, using walker, in NAD  HENT:  Mouth/Throat: Oropharynx is clear and moist.  Eyes: Scleral icterus is present.  Neck: Neck supple. No JVD present. No thyromegaly present.  Cardiovascular: Normal rate, regular rhythm and normal heart sounds.   No murmur heard. Pulmonary/Chest: Effort normal and breath sounds normal. No respiratory distress. She has no wheezes.       Prolonged exp phase   Musculoskeletal: She exhibits edema (1+ bilat LE nonpitting edema).  Lymphadenopathy:    She has no cervical adenopathy.          Assessment & Plan:

## 2011-03-03 ENCOUNTER — Ambulatory Visit: Payer: PRIVATE HEALTH INSURANCE | Admitting: Vascular Surgery

## 2011-03-08 ENCOUNTER — Telehealth: Payer: Self-pay | Admitting: *Deleted

## 2011-03-08 DIAGNOSIS — E039 Hypothyroidism, unspecified: Secondary | ICD-10-CM

## 2011-03-08 DIAGNOSIS — E538 Deficiency of other specified B group vitamins: Secondary | ICD-10-CM

## 2011-03-08 DIAGNOSIS — E785 Hyperlipidemia, unspecified: Secondary | ICD-10-CM

## 2011-03-08 LAB — VITAMIN B12: Vitamin B-12: 339 pg/mL (ref 211–911)

## 2011-03-08 NOTE — Telephone Encounter (Signed)
Pt forgot lab slip.

## 2011-03-09 ENCOUNTER — Telehealth: Payer: Self-pay | Admitting: Family Medicine

## 2011-03-09 LAB — COMPLETE METABOLIC PANEL WITH GFR
ALT: 26 U/L (ref 0–35)
Alkaline Phosphatase: 85 U/L (ref 39–117)
CO2: 28 mEq/L (ref 19–32)
Creat: 0.84 mg/dL (ref 0.50–1.10)
GFR, Est African American: >60 mL/min (ref >60)
Sodium: 140 mEq/L (ref 135–145)
Total Bilirubin: 0.7 mg/dL (ref 0.3–1.2)
Total Protein: 6.2 g/dL (ref 6.0–8.3)

## 2011-03-09 LAB — VITAMIN D 25 HYDROXY (VIT D DEFICIENCY, FRACTURES): Vit D, 25-Hydroxy: 17 ng/mL — ABNORMAL LOW (ref 30–89)

## 2011-03-09 MED ORDER — VITAMIN D3 1.25 MG (50000 UT) PO CAPS: 50000.0000 [IU] | ORAL_CAPSULE | ORAL | Status: DC

## 2011-03-09 NOTE — Telephone Encounter (Signed)
Pls let pt know that her thyroid looks good on current dose of medication.  B12 is at goal and kidney and liver function look good.  Her Vitamin D is LOW.  Will start her on RX wkly vitamin D in addition to her taking OTC vitamin D 1,000 IU/ day.  Repeat Vit D in 3 mos.

## 2011-03-09 NOTE — Telephone Encounter (Signed)
Her labs came to my in basket. Please review.

## 2011-03-09 NOTE — Telephone Encounter (Signed)
Pt aware.

## 2011-03-10 ENCOUNTER — Other Ambulatory Visit: Payer: Self-pay | Admitting: Family Medicine

## 2011-03-23 ENCOUNTER — Other Ambulatory Visit: Payer: Self-pay | Admitting: Family Medicine

## 2011-03-24 ENCOUNTER — Ambulatory Visit: Payer: PRIVATE HEALTH INSURANCE | Admitting: Vascular Surgery

## 2011-03-27 ENCOUNTER — Other Ambulatory Visit: Payer: Self-pay | Admitting: Family Medicine

## 2011-03-29 NOTE — Progress Notes (Signed)
[facility]   Established Renal Artery Stenosis  History of Present Illness  [name] is a 65 y.o. female who presents with chief complaint: follow up on renal artery stenosis.  Pt underwent L RAS 11/28/10.  Previous renal duplex completed on 03/31/11 reveals: R RA stenosis: <60% and L RA stenosis: >60%.   The patient's blood pressure has been stable.  The patient's blood pressure medication regimen has remained stable.  The patient's urinary history has remained stable.  Additionally, the patient has known R CIA aneurysm.  She notes no symptoms for this R CIA aneurysm.  The patient has been able to greatly decrease her smoking.  Past Medical History, Past Surgical History, Social History, Family History, Medications, Allergies, and Review of Systems are unchanged from previous evaluation on 12/09/10.  Physical Examination  Filed Vitals:   03/31/11 1047  BP: 131/76  Pulse: 50  Resp: 20    General: A&O x 3, WDWN, obese  Pulmonary: Sym exp, good air movt, CTAB, no rales, rhonchi, & wheezing  Cardiac: RRR, Nl S1, S2, no Murmurs, rubs or gallops  Vascular: Vessel Right Left  Radial Palpable Palpable  Ulnary Palpable Palpable  Brachial Palpable Palpable  Carotid Palpable, without bruit Palpable, without bruit  Aorta Non-palpable N/A  Femoral Palpable Palpable  Popliteal Non-palpable Non-palpable  PT Non-Palpable Non-Palpable  DP Non-Palpable Non-Palpable   Gastrointestinal: soft, NTND, -G/R, - HSM, - masses, - CVAT B  Musculoskeletal: M/S 5/5 throughout, Extremities without ischemic changes, BLE with CVI signs, L leg 2+ edema, R leg 1+  Neurologic: Light touch decreased in both legs up to mid-shin , Motor exam as listed above  Non-Invasive Vascular Imaging  Renal Duplex (Date: 03/31/11)  R kidney size: 10.8 cm  L kidney size: 9.0 cm  R RA stenosis: <60%%  L RA stenosis: >60% (by RAR criteria but stent in place)  R RI: 0.74  L RI:  0.73  Aortoiliac duplex (03/31/11)  Proximal aorta, SMA and celiac could not be visualized due to bowel gas  Aorta: 43-50 c/s  R CIA: 2.3 cm x 2.2 cm  Medical Decision Making  [name] is a 65 y.o. female who presents with: R RAS s/p stenting and R CIA aneurysm.  Based on the patient's vascular studies and examination, I recommended continued B renal artery duplex and Aortoiliac duplex in 6 months.  The L renal artery stent will result in elevated velocities subsequently, RAR on the L side is somewhat erroneous.  The improved L renal size should be a better measure as is the improved velocities in the left renal arterial system.  I discussed in depth with the patient the nature of atherosclerosis, and emphasized the importance of maximal medical management including strict control of blood pressure, blood glucose, and lipid levels, obtaining regular exercise, and cessation of smoking.  The patient is aware that without maximal medical management the underlying atherosclerotic disease process will progress, limiting the benefit of any interventions.  The patient will follow with [place] in 6 months with the above studies.  Thank you for allowing [place] to participate in this patient's care.  Leonides Sake, MD [facility] [place] Office: [phone] Pager: [phone]

## 2011-03-30 ENCOUNTER — Encounter: Payer: Self-pay | Admitting: Vascular Surgery

## 2011-03-31 ENCOUNTER — Encounter: Payer: Self-pay | Admitting: Vascular Surgery

## 2011-03-31 ENCOUNTER — Ambulatory Visit (INDEPENDENT_AMBULATORY_CARE_PROVIDER_SITE_OTHER): Payer: Medicare Other | Admitting: Vascular Surgery

## 2011-03-31 ENCOUNTER — Encounter (INDEPENDENT_AMBULATORY_CARE_PROVIDER_SITE_OTHER): Payer: Medicare Other

## 2011-03-31 VITALS — BP 131/76 | HR 50 | Resp 20 | Ht 67.0 in | Wt 216.5 lb

## 2011-03-31 DIAGNOSIS — I701 Atherosclerosis of renal artery: Secondary | ICD-10-CM

## 2011-03-31 DIAGNOSIS — I723 Aneurysm of iliac artery: Secondary | ICD-10-CM

## 2011-03-31 DIAGNOSIS — Z48812 Encounter for surgical aftercare following surgery on the circulatory system: Secondary | ICD-10-CM

## 2011-04-11 ENCOUNTER — Encounter: Payer: Self-pay | Admitting: Family Medicine

## 2011-04-14 NOTE — Procedures (Unsigned)
RENAL ARTERY DUPLEX EVALUATION  INDICATION:  Left renal artery stent placed on 11/28/2010.  HISTORY: Diabetes:  Yes. Cardiac:  No. Hypertension:  Yes. Smoking:  Yes.  RENAL ARTERY DUPLEX FINDINGS: Aorta-Proximal:  Not visualized Aorta-Mid:  43 cm/s Aorta-Distal:  50 cm/s Celiac Artery Origin:  Not visualized SMA Origin:  Not visualized                                   RIGHT               LEFT Renal Artery Origin:             Not visualized      176 cm/s Renal Artery Proximal:           129 cm/s            196 cm/s Renal Artery Mid:                64 cm/s             90 cm/s Renal Artery Distal:             70 cm/s             58 cm/s Hilar Acceleration Time (AT):    m/s2                m/s2 Renal-Aortic Ratio (RAR):        3.0                 4.5 Kidney Size:                     10.8 cm             9.0 cm End Diastolic Ratio (EDR): Resistive Index (RI):            0.74                0.73  IMPRESSION: 1. Patent left renal artery stent with renal aortic ratio suggestive     of >60% stenosis.  No hemodynamically significant stenosis of the     right renal artery noted based on renal aortic ratio. 2. The bilateral intrarenal resistive indices and kidney length     measurements are within normal limits, however, the left kidney     measured slightly less than the right. 3. Maximum diameter measurement of the known right common iliac artery     aneurysm was 2.3 x 2.2 cm. 4. The proximal aorta, celiac, superior mesenteric and right renal     artery origins were not adequately visualized due to overlying     bowel gas patterns.  The patient was not n.p.o. for this exam. 5. Significant improvement of the left renal artery velocity noted     when compared to the previous exam on 11/25/2010.  ___________________________________________ Fransisco Hertz, MD  CH/MEDQ  D:  04/04/2011  T:  04/04/2011  Job:  [id number]

## 2011-04-24 ENCOUNTER — Ambulatory Visit (INDEPENDENT_AMBULATORY_CARE_PROVIDER_SITE_OTHER): Payer: Medicare Other | Admitting: Family Medicine

## 2011-04-24 ENCOUNTER — Encounter: Payer: Self-pay | Admitting: Family Medicine

## 2011-04-24 VITALS — BP 147/70 | HR 48 | Wt 219.0 lb

## 2011-04-24 DIAGNOSIS — Z23 Encounter for immunization: Secondary | ICD-10-CM

## 2011-04-24 DIAGNOSIS — I878 Other specified disorders of veins: Secondary | ICD-10-CM

## 2011-04-24 DIAGNOSIS — I872 Venous insufficiency (chronic) (peripheral): Secondary | ICD-10-CM

## 2011-04-24 DIAGNOSIS — E119 Type 2 diabetes mellitus without complications: Secondary | ICD-10-CM

## 2011-04-24 DIAGNOSIS — J309 Allergic rhinitis, unspecified: Secondary | ICD-10-CM

## 2011-04-24 MED ORDER — INSULIN GLARGINE 100 UNIT/ML ~~LOC~~ SOLN: 48.0000 [IU] | Freq: Every day | SUBCUTANEOUS | Status: DC

## 2011-04-24 MED ORDER — AMBULATORY NON FORMULARY MEDICATION: Status: DC

## 2011-04-24 MED ORDER — ALBUTEROL SULFATE HFA 108 (90 BASE) MCG/ACT IN AERS: 2.0000 | INHALATION_SPRAY | Freq: Four times a day (QID) | RESPIRATORY_TRACT | Status: DC | PRN

## 2011-04-24 NOTE — Progress Notes (Signed)
Subjective:    Patient ID: [name], female    DOB: 1946-01-03, 64 y.o.   MRN: [id number]  HPI  Says has had Left leg swelling intermittantly but never this much.  Says today it iss better. She takes her lasix daily.  She has not tried the compression stockings. Seh says she avoids excess salt.  No other recent triggers but it is getting better. No inc in SOB.   DM- has been running between 155-160. Using 45 units on the Lantus regularly.  Due for diabetes A1C in about 5 weeks.  Oveall says her sugars have been running better than they were.   Get frequent indigestion- Says couldn't afford the dexilant. Now only using alkeseltzer at bedtime.  Says helps some but wants something else OTC she can use.   Having frequent nasal sxs and would like to have something to use over the counter.  No nasal congestion but feels itchy and irriated. Get occ HA with it.    Review of Systems  Current outpatient prescriptions:albuterol (PROAIR HFA) 108 (90 BASE) MCG/ACT inhaler, Inhale 2 puffs into the lungs every 6 (six) hours as needed for wheezing., Disp: 1 Inhaler, Rfl: 0;  aspirin EC 81 MG tablet, Take 81 mg by mouth daily.  , Disp: , Rfl: ;  benazepril (LOTENSIN) 10 MG tablet, Take 1 tablet (10 mg total) by mouth daily., Disp: 30 tablet, Rfl: 2 Cholecalciferol (VITAMIN D3) 50000 UNITS CAPS, Take 50,000 Int'l Units by mouth every Sunday., Disp: 15 capsule, Rfl: 0;  clopidogrel (PLAVIX) 75 MG tablet, Take 75 mg by mouth daily.  , Disp: , Rfl: ;  furosemide (LASIX) 40 MG tablet, TAKE ONE TABLET BY MOUTH EVERY DAY IN THE MORNING, Disp: 30 tablet, Rfl: 1;  gabapentin (NEURONTIN) 300 MG capsule, Take 300 mg by mouth 3 (three) times daily. , Disp: , Rfl:  glucose blood test strip, 1 each as needed. Use as instructed , Disp: , Rfl: ;  insulin glargine (LANTUS SOLOSTAR) 100 UNIT/ML injection, Inject 48 Units into the skin daily., Disp: 12 mL, Rfl: 3;  Lancets MISC, by Does not apply route.  , Disp: , Rfl: ;   levothyroxine (SYNTHROID, LEVOTHROID) 150 MCG tablet, TAKE ONE TABLET BY MOUTH EVERY DAY ON  AN  EMPTY  STOMACH, Disp: 30 tablet, Rfl: 3 metFORMIN (GLUMETZA) 1000 MG (MOD) 24 hr tablet, Take 1,000 mg by mouth. Take one tab in evening, Disp: , Rfl: ;  metoprolol (LOPRESSOR) 100 MG tablet, Take 1 tablet (100 mg total) by mouth 2 (two) times daily., Disp: 30 tablet, Rfl: 3;  nitroGLYCERIN (NITROSTAT) 0.4 MG SL tablet, Place 0.4 mg under the tongue every 5 (five) minutes as needed.  , Disp: , Rfl:  simvastatin (ZOCOR) 20 MG tablet, Take 1 tablet (20 mg total) by mouth at bedtime., Disp: 90 tablet, Rfl: 1;  traMADol (ULTRAM) 50 MG tablet, Take 50 mg by mouth. Take 2 tabs 3 times/ day, Disp: , Rfl: ;  zolpidem (AMBIEN) 10 MG tablet, TAKE ONE TABLET BY MOUTH AT BEDTIME AS NEEDED FOR SLEEP, Disp: 30 tablet, Rfl: 1;  AMBULATORY NON FORMULARY MEDICATION, Medication Name: Zostavax IM x 1  , Disp: 1 vial, Rfl: 0 AMBULATORY NON FORMULARY MEDICATION, Medication Name: Moderate grade compression stocking.  Dx. Venous stasis., Disp: 2 Units, Rfl: 1     Objective:   Physical Exam  Constitutional: She is oriented to person, place, and time. She appears well-developed and well-nourished.       Very heavy  smell of cig smoke.   HENT:  Head: Normocephalic and atraumatic.  Right Ear: External ear normal.  Left Ear: External ear normal.  Nose: Nose normal.  Mouth/Throat: Oropharynx is clear and moist. No oropharyngeal exudate.       TMs and canals are clear bilat.   Eyes: Conjunctivae are normal. Pupils are equal, round, and reactive to light.  Neck: Neck supple. No thyromegaly present.  Cardiovascular: Normal rate, regular rhythm and normal heart sounds.        No carotdi bruits.   Pulmonary/Chest: Effort normal and breath sounds normal.  Lymphadenopathy:    She has no cervical adenopathy.  Neurological: She is alert and oriented to person, place, and time.  Skin: Skin is warm and dry.  Psychiatric: She has a  normal mood and affect. Her behavior is normal.          Assessment & Plan:  Venous stasis - will write a rx for compression stockings. Continue daily lasix. Will recheck BP. Reminded her to avoid salt as much as possible.   DM- ON insulin. Increase lantus 48 units. Needs new rx for this.  F.U in 5 weeks. Will be due for next A!C.   Allergic rhinitis - Reminded her that her cigarette smoke is an irritant. Start with neti-pot bid and see if improves sxs. If not consider adding anithistmain or nasal antihistamine. No sig congestion sxs  GERD _ recommend trial of zantac bid. If not improving her sx in 2 weeks then I recommend a PPI such as Prilosec OTC.   She needed a refill on her albuterol.

## 2011-04-25 ENCOUNTER — Other Ambulatory Visit: Payer: Self-pay | Admitting: Family Medicine

## 2011-04-25 MED ORDER — ALBUTEROL SULFATE HFA 108 (90 BASE) MCG/ACT IN AERS: 2.0000 | INHALATION_SPRAY | Freq: Four times a day (QID) | RESPIRATORY_TRACT | Status: DC | PRN

## 2011-04-25 MED ORDER — INSULIN GLARGINE 100 UNIT/ML ~~LOC~~ SOLN: 48.0000 [IU] | Freq: Every day | SUBCUTANEOUS | Status: DC

## 2011-04-25 NOTE — Telephone Encounter (Signed)
Pt called and said her pharmacy does not have her albuterol MDI and her lantus solostar that was suppose to have been sent yesterday. Plan: Reviewed the pt chart file and both of these medications were sent on 04-24-11.  The meds went to [facility], but pt says she told the provider she had changed pharmacies and the meds should have gone to [facility]. Will send the scripts to [facility] Jarvis Newcomer, LPN [facility]

## 2011-05-12 LAB — GLUCOSE, CAPILLARY
Glucose-Capillary: 119 mg/dL — ABNORMAL HIGH (ref 70–99)
Glucose-Capillary: 124 mg/dL — ABNORMAL HIGH (ref 70–99)
Glucose-Capillary: 125 mg/dL — ABNORMAL HIGH (ref 70–99)
Glucose-Capillary: 128 mg/dL — ABNORMAL HIGH (ref 70–99)

## 2011-05-12 LAB — COMPREHENSIVE METABOLIC PANEL
AST: 21 U/L (ref 0–37)
BUN: 33 mg/dL — ABNORMAL HIGH (ref 6–23)
CO2: 29 mEq/L (ref 19–32)
Calcium: 8.7 mg/dL (ref 8.4–10.5)
Chloride: 98 mEq/L (ref 96–112)
Creatinine, Ser: 1.12 mg/dL (ref 0.4–1.2)
GFR calc Af Amer: 59 mL/min — ABNORMAL LOW (ref >60)
GFR calc non Af Amer: 49 mL/min — ABNORMAL LOW (ref >60)
Total Bilirubin: 0.6 mg/dL (ref 0.3–1.2)

## 2011-05-12 LAB — CARDIAC PANEL(CRET KIN+CKTOT+MB+TROPI)
CK, MB: 3.4 ng/mL (ref 0.3–4.0)
CK, MB: 3.8 ng/mL (ref 0.3–4.0)
CK, MB: 4 ng/mL (ref 0.3–4.0)
Relative Index: 2.2 (ref 0.0–2.5)
Total CK: 176 U/L (ref 7–177)
Total CK: 207 U/L — ABNORMAL HIGH (ref 7–177)
Troponin I: 0.01 ng/mL (ref 0.00–0.06)

## 2011-05-12 LAB — CBC
MCV: 96.6 fL (ref 78.0–100.0)
Platelets: 228 10*3/uL (ref 150–400)
WBC: 6.4 10*3/uL (ref 4.0–10.5)

## 2011-05-12 LAB — TSH: TSH: 1.686 u[IU]/mL (ref 0.350–4.500)

## 2011-05-15 ENCOUNTER — Other Ambulatory Visit: Payer: Self-pay | Admitting: *Deleted

## 2011-05-15 MED ORDER — FUROSEMIDE 40 MG PO TABS: 40.0000 mg | ORAL_TABLET | Freq: Every day | ORAL | Status: DC

## 2011-05-22 ENCOUNTER — Other Ambulatory Visit: Payer: Self-pay | Admitting: *Deleted

## 2011-05-22 MED ORDER — METOPROLOL TARTRATE 100 MG PO TABS: 100.0000 mg | ORAL_TABLET | Freq: Two times a day (BID) | ORAL | Status: DC

## 2011-05-26 ENCOUNTER — Other Ambulatory Visit: Payer: Self-pay | Admitting: Family Medicine

## 2011-05-31 ENCOUNTER — Other Ambulatory Visit: Payer: Self-pay | Admitting: Family Medicine

## 2011-06-02 ENCOUNTER — Other Ambulatory Visit: Payer: Self-pay | Admitting: Family Medicine

## 2011-06-02 NOTE — Telephone Encounter (Signed)
Pt calling for refill of ambien 10 mg PO QHS PRN. Plan:  Called the pharmacy and they didn't have the script.  Looks like when medications pulled up that the refill was entered but not signed on 05-31-11, therefore finished signing and the order changed to today's date and the script was sent electronically.  Pharmacist aware to be looking for script to come across. [name] Newcomer, LPN [facility]

## 2011-06-06 ENCOUNTER — Encounter: Payer: Medicare Other | Attending: Physical Medicine & Rehabilitation

## 2011-06-06 ENCOUNTER — Ambulatory Visit: Payer: Medicare Other | Admitting: Physical Medicine & Rehabilitation

## 2011-06-06 DIAGNOSIS — G600 Hereditary motor and sensory neuropathy: Secondary | ICD-10-CM

## 2011-06-06 DIAGNOSIS — E1142 Type 2 diabetes mellitus with diabetic polyneuropathy: Secondary | ICD-10-CM

## 2011-06-06 DIAGNOSIS — R209 Unspecified disturbances of skin sensation: Secondary | ICD-10-CM | POA: Insufficient documentation

## 2011-06-06 DIAGNOSIS — Z79899 Other long term (current) drug therapy: Secondary | ICD-10-CM | POA: Insufficient documentation

## 2011-06-06 DIAGNOSIS — E1149 Type 2 diabetes mellitus with other diabetic neurological complication: Secondary | ICD-10-CM | POA: Insufficient documentation

## 2011-06-06 DIAGNOSIS — M79609 Pain in unspecified limb: Secondary | ICD-10-CM | POA: Insufficient documentation

## 2011-06-06 DIAGNOSIS — M47817 Spondylosis without myelopathy or radiculopathy, lumbosacral region: Secondary | ICD-10-CM | POA: Insufficient documentation

## 2011-06-06 DIAGNOSIS — Z794 Long term (current) use of insulin: Secondary | ICD-10-CM | POA: Insufficient documentation

## 2011-06-06 NOTE — Assessment & Plan Note (Signed)
REASON FOR VISIT:  Lower extremity pain, and numbness.  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  She has had no falls.  She uses a walker to ambulate.  She no longer drives.  She is independent with her basic self-care needs, some help with certain household duties and shopping.  Other past medical history significant for lumbar spondylosis, has had good results with facet injections in the past.  Her Oswestry score today is 44% compared to 50% last visit.  No significant change.  EXAMINATION:  She has decreased sensation to light touch, as well as proprioception left foot.  There is no evidence of skin breakdown on either foot.  She has intact sensation to sharp touch and proprioception on the right foot.  She has mild ankle dorsiflexor weakness on the right side graded as 4 and grades as 3 on the left side.  She has some tendency towards foot drag on the left side, but she is able ______ with steppage gait.  Her up proximal strength is 4/5 in the hip flexor and knee extensor on the right and 4- on the left side.  Hand strength is normal.  Hand sensation is normal.  IMPRESSION: 1. 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 Charcot-     Marie-Tooth as well as diabetic.  Plan, restricted diabetic     control. 2. She is seeing her primary care physician and continues on metformin     as well as Lantus on this. 3. From a pain standpoint, she is quite stable, she will continue on     tramadol 2 p.o. t.i.d., gabapentin 300 t.i.d., and Tylenol No. 3 1-     1/2 daily. Discussed with the patient and agrees with plan.  I will see her back in 6 months.     Erick Colace, M.D. Electronically Signed    AEK/MedQ D:  06/06/2011 10:46:12  T:  06/06/2011 12:04:33  Job #:  [id number]

## 2011-07-13 ENCOUNTER — Other Ambulatory Visit: Payer: Self-pay | Admitting: Family Medicine

## 2011-07-17 ENCOUNTER — Other Ambulatory Visit: Payer: Self-pay | Admitting: Family Medicine

## 2011-07-24 ENCOUNTER — Other Ambulatory Visit: Payer: Self-pay | Admitting: Family Medicine

## 2011-08-19 DIAGNOSIS — S82899A Other fracture of unspecified lower leg, initial encounter for closed fracture: Secondary | ICD-10-CM | POA: Diagnosis not present

## 2011-08-19 DIAGNOSIS — S82843A Displaced bimalleolar fracture of unspecified lower leg, initial encounter for closed fracture: Secondary | ICD-10-CM | POA: Diagnosis not present

## 2011-08-19 DIAGNOSIS — E1149 Type 2 diabetes mellitus with other diabetic neurological complication: Secondary | ICD-10-CM | POA: Diagnosis not present

## 2011-08-19 DIAGNOSIS — E1142 Type 2 diabetes mellitus with diabetic polyneuropathy: Secondary | ICD-10-CM | POA: Diagnosis not present

## 2011-08-19 DIAGNOSIS — S8263XA Displaced fracture of lateral malleolus of unspecified fibula, initial encounter for closed fracture: Secondary | ICD-10-CM | POA: Diagnosis not present

## 2011-08-19 DIAGNOSIS — Z96659 Presence of unspecified artificial knee joint: Secondary | ICD-10-CM | POA: Diagnosis not present

## 2011-08-19 DIAGNOSIS — F17200 Nicotine dependence, unspecified, uncomplicated: Secondary | ICD-10-CM | POA: Diagnosis not present

## 2011-08-22 DIAGNOSIS — S82843A Displaced bimalleolar fracture of unspecified lower leg, initial encounter for closed fracture: Secondary | ICD-10-CM | POA: Diagnosis not present

## 2011-08-23 DIAGNOSIS — S82843A Displaced bimalleolar fracture of unspecified lower leg, initial encounter for closed fracture: Secondary | ICD-10-CM | POA: Diagnosis not present

## 2011-08-25 ENCOUNTER — Telehealth: Payer: Self-pay

## 2011-08-25 NOTE — Telephone Encounter (Signed)
Rec'd call from Dr. Acquanetta Sit office with request for Dr.Chen to advise if pt. can hold Plavix 5 days prior to Open Reduction/Internal Fixation of ankle scheduled on 09/01/11.  (Pt. has hx. of Left renal artery stenting 11/28/10.)   Discussed with Dr. Leonides Sake.  Rec'd verbal order that pt. may hold Plavix 5 days prior to procedure, and resume after procedure.  Will fax order to [facility] at [phone].

## 2011-08-28 ENCOUNTER — Other Ambulatory Visit: Payer: Self-pay | Admitting: Family Medicine

## 2011-09-01 DIAGNOSIS — K219 Gastro-esophageal reflux disease without esophagitis: Secondary | ICD-10-CM | POA: Diagnosis not present

## 2011-09-01 DIAGNOSIS — Z7982 Long term (current) use of aspirin: Secondary | ICD-10-CM | POA: Diagnosis not present

## 2011-09-01 DIAGNOSIS — E039 Hypothyroidism, unspecified: Secondary | ICD-10-CM | POA: Diagnosis not present

## 2011-09-01 DIAGNOSIS — E7800 Pure hypercholesterolemia, unspecified: Secondary | ICD-10-CM | POA: Diagnosis not present

## 2011-09-01 DIAGNOSIS — S82843A Displaced bimalleolar fracture of unspecified lower leg, initial encounter for closed fracture: Secondary | ICD-10-CM | POA: Diagnosis not present

## 2011-09-01 DIAGNOSIS — F17200 Nicotine dependence, unspecified, uncomplicated: Secondary | ICD-10-CM | POA: Diagnosis not present

## 2011-09-01 DIAGNOSIS — I10 Essential (primary) hypertension: Secondary | ICD-10-CM | POA: Diagnosis not present

## 2011-09-01 DIAGNOSIS — J449 Chronic obstructive pulmonary disease, unspecified: Secondary | ICD-10-CM | POA: Diagnosis not present

## 2011-09-01 DIAGNOSIS — M25579 Pain in unspecified ankle and joints of unspecified foot: Secondary | ICD-10-CM | POA: Diagnosis not present

## 2011-09-01 DIAGNOSIS — E119 Type 2 diabetes mellitus without complications: Secondary | ICD-10-CM | POA: Diagnosis not present

## 2011-09-01 DIAGNOSIS — Z96659 Presence of unspecified artificial knee joint: Secondary | ICD-10-CM | POA: Diagnosis not present

## 2011-09-01 DIAGNOSIS — M129 Arthropathy, unspecified: Secondary | ICD-10-CM | POA: Diagnosis not present

## 2011-09-01 DIAGNOSIS — IMO0002 Reserved for concepts with insufficient information to code with codable children: Secondary | ICD-10-CM | POA: Diagnosis not present

## 2011-09-01 DIAGNOSIS — Z794 Long term (current) use of insulin: Secondary | ICD-10-CM | POA: Diagnosis not present

## 2011-09-01 DIAGNOSIS — Z79899 Other long term (current) drug therapy: Secondary | ICD-10-CM | POA: Diagnosis not present

## 2011-09-02 DIAGNOSIS — I10 Essential (primary) hypertension: Secondary | ICD-10-CM | POA: Diagnosis not present

## 2011-09-02 DIAGNOSIS — E7800 Pure hypercholesterolemia, unspecified: Secondary | ICD-10-CM | POA: Diagnosis not present

## 2011-09-02 DIAGNOSIS — K219 Gastro-esophageal reflux disease without esophagitis: Secondary | ICD-10-CM | POA: Diagnosis not present

## 2011-09-02 DIAGNOSIS — J449 Chronic obstructive pulmonary disease, unspecified: Secondary | ICD-10-CM | POA: Diagnosis not present

## 2011-09-02 DIAGNOSIS — S82843A Displaced bimalleolar fracture of unspecified lower leg, initial encounter for closed fracture: Secondary | ICD-10-CM | POA: Diagnosis not present

## 2011-09-02 DIAGNOSIS — M129 Arthropathy, unspecified: Secondary | ICD-10-CM | POA: Diagnosis not present

## 2011-09-06 DIAGNOSIS — Z96659 Presence of unspecified artificial knee joint: Secondary | ICD-10-CM | POA: Diagnosis not present

## 2011-09-06 DIAGNOSIS — J40 Bronchitis, not specified as acute or chronic: Secondary | ICD-10-CM | POA: Diagnosis not present

## 2011-09-06 DIAGNOSIS — J984 Other disorders of lung: Secondary | ICD-10-CM | POA: Diagnosis not present

## 2011-09-06 DIAGNOSIS — I519 Heart disease, unspecified: Secondary | ICD-10-CM | POA: Diagnosis not present

## 2011-09-06 DIAGNOSIS — R0609 Other forms of dyspnea: Secondary | ICD-10-CM | POA: Diagnosis not present

## 2011-09-06 DIAGNOSIS — F17200 Nicotine dependence, unspecified, uncomplicated: Secondary | ICD-10-CM | POA: Diagnosis not present

## 2011-09-06 DIAGNOSIS — E785 Hyperlipidemia, unspecified: Secondary | ICD-10-CM | POA: Diagnosis not present

## 2011-09-06 DIAGNOSIS — R059 Cough, unspecified: Secondary | ICD-10-CM | POA: Diagnosis not present

## 2011-09-06 DIAGNOSIS — R05 Cough: Secondary | ICD-10-CM | POA: Diagnosis not present

## 2011-09-06 DIAGNOSIS — Z9889 Other specified postprocedural states: Secondary | ICD-10-CM | POA: Diagnosis not present

## 2011-09-06 DIAGNOSIS — R918 Other nonspecific abnormal finding of lung field: Secondary | ICD-10-CM | POA: Diagnosis not present

## 2011-09-06 DIAGNOSIS — E119 Type 2 diabetes mellitus without complications: Secondary | ICD-10-CM | POA: Diagnosis not present

## 2011-09-06 DIAGNOSIS — R911 Solitary pulmonary nodule: Secondary | ICD-10-CM | POA: Diagnosis not present

## 2011-09-06 DIAGNOSIS — J189 Pneumonia, unspecified organism: Secondary | ICD-10-CM | POA: Diagnosis not present

## 2011-09-06 DIAGNOSIS — I10 Essential (primary) hypertension: Secondary | ICD-10-CM | POA: Diagnosis not present

## 2011-09-06 DIAGNOSIS — R0989 Other specified symptoms and signs involving the circulatory and respiratory systems: Secondary | ICD-10-CM | POA: Diagnosis not present

## 2011-09-08 ENCOUNTER — Ambulatory Visit (INDEPENDENT_AMBULATORY_CARE_PROVIDER_SITE_OTHER): Payer: Medicare Other | Admitting: Family Medicine

## 2011-09-08 ENCOUNTER — Encounter: Payer: Self-pay | Admitting: Family Medicine

## 2011-09-08 VITALS — BP 153/101 | HR 82 | Temp 98.6°F

## 2011-09-08 DIAGNOSIS — J40 Bronchitis, not specified as acute or chronic: Secondary | ICD-10-CM | POA: Diagnosis not present

## 2011-09-08 DIAGNOSIS — J441 Chronic obstructive pulmonary disease with (acute) exacerbation: Secondary | ICD-10-CM

## 2011-09-08 DIAGNOSIS — S82843A Displaced bimalleolar fracture of unspecified lower leg, initial encounter for closed fracture: Secondary | ICD-10-CM | POA: Diagnosis not present

## 2011-09-08 DIAGNOSIS — Z1231 Encounter for screening mammogram for malignant neoplasm of breast: Secondary | ICD-10-CM

## 2011-09-08 DIAGNOSIS — IMO0001 Reserved for inherently not codable concepts without codable children: Secondary | ICD-10-CM | POA: Diagnosis not present

## 2011-09-08 DIAGNOSIS — J449 Chronic obstructive pulmonary disease, unspecified: Secondary | ICD-10-CM

## 2011-09-08 MED ORDER — HYDROCODONE-HOMATROPINE 5-1.5 MG/5ML PO SYRP: 5.0000 mL | ORAL_SOLUTION | Freq: Every evening | ORAL | Status: AC | PRN

## 2011-09-08 MED ORDER — ALBUTEROL SULFATE (2.5 MG/3ML) 0.083% IN NEBU
2.5000 mg | INHALATION_SOLUTION | Freq: Four times a day (QID) | RESPIRATORY_TRACT | Status: DC | PRN
  Administered 2011-09-08: 2.5 mg via RESPIRATORY_TRACT

## 2011-09-08 NOTE — Progress Notes (Signed)
  Subjective:    Patient ID: [name], female    DOB: May 25, 1946, 66 y.o.   MRN: [id number]  HPI Here for ED follow up. Went to ED, [facility],  for bronchitis. Had Ct which showed a pulmonary nodule.  Placed on ABX (10 days course but she doesn't remember the name) and steroids. Recently had foot surgery and d/c home Friday.  Then 5 days ago started coughing.  Initially no abx but went back on Wednesday and then given ABX.  Not really feeling any  Better.  Phlegm is stuck in her chest and feels she can't get it out. She feels she is now sore in her ribs from coughing. She does have some increased shortness of breath with activity..  Was using her albuterol. Using 2 puffs every 6 hours.  No fever. No GI sxs.    Review of Systems     Objective:   Physical Exam  Constitutional: She is oriented to person, place, and time. She appears well-developed and well-nourished.  HENT:  Head: Normocephalic and atraumatic.  Right Ear: External ear normal.  Left Ear: External ear normal.  Nose: Nose normal.  Mouth/Throat: Oropharynx is clear and moist.       TMs and canals are clear.   Eyes: Conjunctivae and EOM are normal. Pupils are equal, round, and reactive to light.  Neck: Neck supple. No thyromegaly present.  Cardiovascular: Normal rate, regular rhythm and normal heart sounds.   Pulmonary/Chest: Effort normal and breath sounds normal. She has no wheezes.  Lymphadenopathy:    She has no cervical adenopathy.  Neurological: She is alert and oriented to person, place, and time.  Skin: Skin is warm and dry.  Psychiatric: She has a normal mood and affect.          Assessment & Plan:  Bronchitis/COPD exacerbation - Add mucinex.  Already on steroid.  Peak flow in yellow. Given NEB treatment and she felt much better. Increase albuterol to 4 puffs every 6 hours.  Continue current medication. Plan is to schedule her for spirometry in about a month when she's had this acute bronchitis and  exacerbation. Unfortunately we have not done this in a couple of years.  We need to further stage her disease process,  she may benefit from being on prophylactic inhalers versus just her as needed albuterol.  Lung nodule - Will get records from the ED and see what kind of follow up she needs.   DM- Needs to schedule f/u. Was supposed to come back in June.    HTN- Recheck in 1-2 weeks once feeling better and not as SOB.

## 2011-09-08 NOTE — Patient Instructions (Addendum)
Needs to schedule your diabetic follow-up in 1-2 weeks. Then need to schedule spirometry in 3-4 weeks to evaluate your COPD.   OK to add mucinex.     Acute Bronchitis Bronchitis is when the organs and tissues involved in breathing get puffy (swollen) and can leak fluid. This makes it harder for air to get in and out of the lungs. You may cough a lot and produce thick spit (mucus). Acute means the illness started suddenly. HOME CARE  Rest.     Drink enough fluids to keep the pee (urine) clear or pale yellow.     Medicines may be given that will open up your airways to help you breathe better. Only take medicine as told by your doctor.     Use a cool mist vaporizer. This will help to thin any thick spit.     Do not smoke. Avoid secondhand smoke.  GET HELP RIGHT AWAY IF:    You have a temperature by mouth above 102 F (38.9 C), not controlled by medicine.     You have chills.     You develop severe shortness of breath or chest pain.     You have bloody spit mixed with mucus (sputum).     You throw up (vomit) often.     You lose too much body fluid (dehydrated).     You have a severe headache.     You feel faint.     You do not improve after 1 week of treatment.  MAKE SURE YOU:    Understand these instructions.     Will watch your condition.     Will get help right away if you are not doing well or get worse.  Document Released: 01/10/2008 Document Revised: 04/05/2011 Document Reviewed: 08/11/2009 [facility] Patient Information 2012 [place], [place].

## 2011-09-21 ENCOUNTER — Telehealth: Payer: Self-pay | Admitting: *Deleted

## 2011-09-22 ENCOUNTER — Telehealth: Payer: Self-pay | Admitting: *Deleted

## 2011-09-22 MED ORDER — MAGIC MOUTHWASH W/LIDOCAINE: 5.0000 mL | Freq: Two times a day (BID) | ORAL | Status: DC

## 2011-09-22 NOTE — Telephone Encounter (Signed)
Sent magic mouthwash to pharmacy.

## 2011-09-22 NOTE — Telephone Encounter (Signed)
Pt informed

## 2011-09-22 NOTE — Telephone Encounter (Signed)
Tongue feels raw and her mouth burns when she eats. States that her mouth is really red. Pt states she was diagnosed thrush before by Dr. Cathey Endow and had same symptoms. Would like to know if we will call her something in. Please advise.

## 2011-09-25 ENCOUNTER — Telehealth: Payer: Self-pay | Admitting: Family Medicine

## 2011-09-25 ENCOUNTER — Other Ambulatory Visit: Payer: Self-pay | Admitting: Family Medicine

## 2011-09-25 MED ORDER — TRAZODONE HCL 50 MG PO TABS: 25.0000 mg | ORAL_TABLET | Freq: Every day | ORAL | Status: DC

## 2011-09-25 NOTE — Telephone Encounter (Signed)
Needs appointment

## 2011-09-25 NOTE — Telephone Encounter (Signed)
Per insurance request will d/c her Remus Loffler and try trazodone.Safer med based on her age.

## 2011-09-26 NOTE — Telephone Encounter (Signed)
Pt informed

## 2011-09-29 ENCOUNTER — Ambulatory Visit: Payer: Medicare Other | Admitting: Family Medicine

## 2011-09-29 ENCOUNTER — Other Ambulatory Visit: Payer: Medicare Other

## 2011-09-29 ENCOUNTER — Ambulatory Visit: Payer: Medicare Other | Admitting: Vascular Surgery

## 2011-10-03 ENCOUNTER — Other Ambulatory Visit: Payer: Self-pay | Admitting: Family Medicine

## 2011-10-04 NOTE — Telephone Encounter (Signed)
Needs appointment

## 2011-10-06 ENCOUNTER — Telehealth: Payer: Self-pay | Admitting: Family Medicine

## 2011-10-06 ENCOUNTER — Encounter: Payer: Self-pay | Admitting: Family Medicine

## 2011-10-06 ENCOUNTER — Ambulatory Visit (INDEPENDENT_AMBULATORY_CARE_PROVIDER_SITE_OTHER): Payer: Medicare Other | Admitting: Family Medicine

## 2011-10-06 DIAGNOSIS — Z23 Encounter for immunization: Secondary | ICD-10-CM

## 2011-10-06 DIAGNOSIS — I10 Essential (primary) hypertension: Secondary | ICD-10-CM

## 2011-10-06 DIAGNOSIS — E119 Type 2 diabetes mellitus without complications: Secondary | ICD-10-CM | POA: Diagnosis not present

## 2011-10-06 DIAGNOSIS — R238 Other skin changes: Secondary | ICD-10-CM

## 2011-10-06 LAB — POCT GLYCOSYLATED HEMOGLOBIN (HGB A1C): Hemoglobin A1C: 8.3

## 2011-10-06 MED ORDER — BENAZEPRIL HCL 20 MG PO TABS: 20.0000 mg | ORAL_TABLET | Freq: Every day | ORAL | Status: DC

## 2011-10-06 MED ORDER — DIPHENOXYLATE-ATROPINE 2.5-0.025 MG PO TABS: 1.0000 | ORAL_TABLET | Freq: Four times a day (QID) | ORAL | Status: AC | PRN

## 2011-10-06 NOTE — Patient Instructions (Signed)
Bump the lantus to 48 to units.   We will call you about the lung nodule.

## 2011-10-06 NOTE — Telephone Encounter (Signed)
Please call pt:  since her blood pressure was high at her office visit on Friday I am increasing her benazepril to 20 mg. She can take 2 tablets of what she currently has and I will send a new prescription over for the 20 mg dose.

## 2011-10-06 NOTE — Progress Notes (Signed)
  Subjective:    Patient ID: [name], female    DOB: 1946-06-14, 66 y.o.   MRN: [id number]  Diabetes She presents for her follow-up diabetic visit. She has type 2 diabetes mellitus. Her disease course has been stable. There are no hypoglycemic associated symptoms. Associated symptoms include foot paresthesias. Pertinent negatives for diabetes include no polydipsia, no polyphagia and no polyuria. There are no hypoglycemic complications. There are no diabetic complications. Current diabetic treatment includes insulin injections. She is compliant with treatment most of the time. She is following a generally healthy diet. There is no change in her home blood glucose trend. Her breakfast blood glucose range is generally 140-180 mg/dl. An ACE inhibitor/angiotensin II receptor blocker is being taken. Eye exam is current.   HTN- No CP or SOB.    She also has a history of chronic diarrhea. She says she was given a prescription for Lomotil in the past by Dr. Cathey Endow and  would like to have a refill on this.   Review of Systems  Genitourinary: Negative for polyuria.  Hematological: Negative for polydipsia and polyphagia.       Objective:   Physical Exam  Constitutional: She is oriented to person, place, and time. She appears well-developed and well-nourished.  HENT:  Head: Normocephalic and atraumatic.  Cardiovascular: Normal rate, regular rhythm and normal heart sounds.   Pulmonary/Chest: Effort normal and breath sounds normal.  Neurological: She is alert and oriented to person, place, and time.  Skin: Skin is warm and dry.  Psychiatric: She has a normal mood and affect. Her behavior is normal.          Assessment & Plan:  Diabetes-not well controlled. Her A1c is up from last time. She does exercise be taking higher dose of Lantus but was only taking 42 units. Does a lot of increased to 48 units and I will see her back in 6 weeks to make sure that we are making some progress. Continue to  work on diet. She says she's Trying a new system to help her remember to take her medications better. She admits she has been forgetting. She is unable to give a urine sample today thus will try to check a urine microbial and her next office visit.  Pneumonia vaccine given   She also once in a while she has easy bruising. I explained to her that she is on aspirin plus Plavix. I decreased the aspirin dosing on her med list. She is actually taking a baby ASA daily.  HTN- Increase benazepril to 20mg  daily.   Chronic diarrhea-refill her prescription for Lomotil. Use sparingly.

## 2011-10-09 ENCOUNTER — Telehealth: Payer: Self-pay | Admitting: Family Medicine

## 2011-10-09 NOTE — Telephone Encounter (Signed)
Pt informed

## 2011-10-09 NOTE — Telephone Encounter (Signed)
I did get a copy of her CT from the hospital. Nodule was 2 mm in size. Guidelines recommend to repeat CT in one year based on the size being < 4mm and that she is moderate risk bc she is a smoker and no prior hx of cancer. If the lesion is stable, or unchanged in 1 year then no further evaluation needed.

## 2011-10-10 NOTE — Telephone Encounter (Signed)
Pt informed

## 2011-10-11 ENCOUNTER — Other Ambulatory Visit: Payer: Self-pay | Admitting: Family Medicine

## 2011-10-11 NOTE — Telephone Encounter (Signed)
Needs appointment

## 2011-10-13 DIAGNOSIS — IMO0001 Reserved for inherently not codable concepts without codable children: Secondary | ICD-10-CM | POA: Diagnosis not present

## 2011-10-13 DIAGNOSIS — S82843A Displaced bimalleolar fracture of unspecified lower leg, initial encounter for closed fracture: Secondary | ICD-10-CM | POA: Diagnosis not present

## 2011-10-16 ENCOUNTER — Other Ambulatory Visit: Payer: Self-pay | Admitting: Family Medicine

## 2011-10-16 MED ORDER — TRAZODONE HCL 100 MG PO TABS: 100.0000 mg | ORAL_TABLET | Freq: Every day | ORAL | Status: DC

## 2011-10-19 ENCOUNTER — Encounter: Payer: Self-pay | Admitting: Vascular Surgery

## 2011-10-20 ENCOUNTER — Other Ambulatory Visit: Payer: Medicare Other

## 2011-10-20 ENCOUNTER — Encounter: Payer: Self-pay | Admitting: Vascular Surgery

## 2011-10-20 ENCOUNTER — Ambulatory Visit (INDEPENDENT_AMBULATORY_CARE_PROVIDER_SITE_OTHER): Payer: Medicare Other | Admitting: Vascular Surgery

## 2011-10-20 ENCOUNTER — Other Ambulatory Visit (INDEPENDENT_AMBULATORY_CARE_PROVIDER_SITE_OTHER): Payer: Medicare Other | Admitting: *Deleted

## 2011-10-20 VITALS — BP 159/80 | HR 56 | Resp 56 | Ht 67.0 in | Wt 219.0 lb

## 2011-10-20 DIAGNOSIS — I723 Aneurysm of iliac artery: Secondary | ICD-10-CM | POA: Diagnosis not present

## 2011-10-20 DIAGNOSIS — I701 Atherosclerosis of renal artery: Secondary | ICD-10-CM

## 2011-10-20 NOTE — Progress Notes (Signed)
[facility]  Established Renal Artery Stenosis  History of Present Illness  [name] is a 66 y.o. (Apr 22, 1946) female who presents with chief complaint: follow up on renal artery stenosis.  Previous renal duplex completed on 03/31/11 reveals: R RA stenosis: <60% and L RA stenosis: >60%  The patient's blood pressure has been stable.  The patient's blood pressure medication regimen has remained stable.  The patient's urinary history has remained stable.  Past Medical History, Past Surgical History, Social History, Family History, Medications, Allergies, and Review of Systems are unchanged from previous evaluation on 03/31/11.  Physical Examination  Filed Vitals:   10/20/11 1053  BP: 159/80  Pulse: 56  Resp: 56  Height: 5\' 7"  (1.702 m)  Weight: 219 lb (99.338 kg)  SpO2: 100%   Body mass index is 34.30 kg/(m^2).  General: A&O x 3, WDWN  Eyes: PERRLA, EOMI  Pulmonary: Sym exp, good air movt, CTAB, no rales, rhonchi, & wheezing  Cardiac: RRR, Nl S1, S2, no Murmurs, rubs or gallops  Vascular: Vessel Right Left  Radial Palpable Palpable  Brachial Palpable Palpable  Carotid Palpable, without bruit Palpable, without bruit  Aorta Non-palpable N/A  Femoral Palpable Palpable  Popliteal Non-palpable Non-palpable  PT Non-Palpable Non-Palpable  DP Non-Palpable Non-Palpable   Gastrointestinal: soft, NTND, -G/R, - HSM, - masses, - CVAT B  Musculoskeletal: M/S 5/5 throughout , Extremities without ischemic changes   Neurologic: Pain and light touch intact in extremities , Motor exam as listed above  Non-Invasive Vascular Imaging  Renal Duplex (Date: 10/20/11)  R kidney size: 10.4 cm  L kidney size: 9.3 cm  R RA stenosis: <60%  L RA stenosis: <60%  R RI: 0.74  L RI: 0.77  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  Similarly, the R CIA aneurysm is roughly the same size.  Based on the patient's vascular studies and examination, I have offered the patient: continue surveillance in 6 months.  If the numbers remain similar, we will stretch out surveillance to annual.  I discussed in depth with the patient the nature of atherosclerosis, and emphasized the importance of maximal medical management including strict control of blood pressure, blood glucose, and lipid levels, antiplatelet agents, obtaining regular exercise, and cessation of smoking.  The patient is aware that without maximal medical management the underlying atherosclerotic disease process will progress, limiting the benefit of any interventions.  Thank you for allowing [place] to participate in this patient's care.  Leonides Sake, MD [facility] Office: [phone] Pager: [phone]  10/20/2011, 12:51 PM

## 2011-11-01 ENCOUNTER — Other Ambulatory Visit: Payer: Self-pay | Admitting: Family Medicine

## 2011-11-07 ENCOUNTER — Other Ambulatory Visit: Payer: Self-pay | Admitting: Family Medicine

## 2011-11-20 ENCOUNTER — Ambulatory Visit: Payer: Medicare Other | Admitting: Family Medicine

## 2011-11-22 ENCOUNTER — Other Ambulatory Visit: Payer: Self-pay | Admitting: Family Medicine

## 2011-11-27 LAB — COMPLETE METABOLIC PANEL WITH GFR
ALT: 19 U/L (ref 0–35)
Albumin: 4 g/dL (ref 3.5–5.2)
CO2: 34 mEq/L — ABNORMAL HIGH (ref 19–32)
Chloride: 101 mEq/L (ref 96–112)
GFR, Est African American: 69 mL/min
GFR, Est Non African American: 60 mL/min
Glucose, Bld: 112 mg/dL — ABNORMAL HIGH (ref 70–99)
Potassium: 4.8 mEq/L (ref 3.5–5.3)
Sodium: 141 mEq/L (ref 135–145)
Total Bilirubin: 0.6 mg/dL (ref 0.3–1.2)
Total Protein: 6.2 g/dL (ref 6.0–8.3)

## 2011-11-27 LAB — LIPID PANEL
HDL: 39 mg/dL — ABNORMAL LOW (ref >39)
LDL Cholesterol: 73 mg/dL (ref 0–99)

## 2011-11-28 ENCOUNTER — Encounter: Payer: Self-pay | Admitting: Family Medicine

## 2011-11-28 ENCOUNTER — Ambulatory Visit (INDEPENDENT_AMBULATORY_CARE_PROVIDER_SITE_OTHER): Payer: Medicare Other | Admitting: Family Medicine

## 2011-11-28 ENCOUNTER — Telehealth: Payer: Self-pay | Admitting: Family Medicine

## 2011-11-28 DIAGNOSIS — E039 Hypothyroidism, unspecified: Secondary | ICD-10-CM | POA: Diagnosis not present

## 2011-11-28 DIAGNOSIS — I10 Essential (primary) hypertension: Secondary | ICD-10-CM

## 2011-11-28 DIAGNOSIS — E119 Type 2 diabetes mellitus without complications: Secondary | ICD-10-CM

## 2011-11-28 DIAGNOSIS — R609 Edema, unspecified: Secondary | ICD-10-CM

## 2011-11-28 DIAGNOSIS — R3129 Other microscopic hematuria: Secondary | ICD-10-CM | POA: Diagnosis not present

## 2011-11-28 DIAGNOSIS — N35919 Unspecified urethral stricture, male, unspecified site: Secondary | ICD-10-CM | POA: Diagnosis not present

## 2011-11-28 DIAGNOSIS — Z0000 Encounter for general adult medical examination without abnormal findings: Secondary | ICD-10-CM | POA: Diagnosis not present

## 2011-11-28 DIAGNOSIS — N319 Neuromuscular dysfunction of bladder, unspecified: Secondary | ICD-10-CM | POA: Diagnosis not present

## 2011-11-28 LAB — POCT UA - MICROALBUMIN

## 2011-11-28 MED ORDER — AMLODIPINE BESYLATE 5 MG PO TABS: 5.0000 mg | ORAL_TABLET | Freq: Every day | ORAL | Status: DC

## 2011-11-28 MED ORDER — AMBULATORY NON FORMULARY MEDICATION: Status: DC

## 2011-11-28 NOTE — Progress Notes (Signed)
  Subjective:    Patient ID: [name], female    DOB: 1946/03/05, 66 y.o.   MRN: [id number]  HPI DM - WE inc her lantus to 48 units. Fasting 90-120 in the AM. Says sometimes what she eats will affect it. She has not hypoglycemia.  Sleeping well.   Hands and feet have been more swollen for a week or so. Says she eats a very low salt diet.  She does take Lasix daily. She has not taking any extra. She notices that her rings her Lac-Hydrin she is having to wear them on different fingers to get them on. No other changes to her medication regimen. No chest pain or shortness of breath.  Hypertension-she did start benazepril. On the med list that she was taking 10 mg I increased her to 20 mg. She says when she got home she was she is not taking any benazepril at all. Since then she has started the 20 mg tab. Unfortunately her blood pressure has not changed significantly from her last office visit 6 weeks ago. She does admit she just smoked a cigarette but before she came in to the office.  Review of Systems     Objective:   Physical Exam  Constitutional: She is oriented to person, place, and time. She appears well-developed and well-nourished.  HENT:  Head: Normocephalic and atraumatic.  Cardiovascular: Normal rate, regular rhythm and normal heart sounds.   Pulmonary/Chest: Effort normal and breath sounds normal.  Neurological: She is alert and oriented to person, place, and time.  Skin: Skin is warm and dry.  Psychiatric: She has a normal mood and affect. Her behavior is normal.          Assessment & Plan:  DM- F/U in 6 weeks.  Home sugars much improved.  Continue current regimen. I think a slight increase in her Lantus has been a very big difference in controlling her sugars. Also thinks she's being more consistent and paying more attention to what she is eating, her food choices.  HTN - uncontrolled. Amlodipine 5 mg to her metoprolol and her benazepril. Also because she's had a little  increased swelling of the last week she can increase her Lasix to twice a day for the next 2-3 day schedule extra fluid off and see if that helps. If she finds she is needing to take an extra dose more frequently then we may need to consider bumping up her dose permanently. We will then need to follow her potassium very closely. Recheck BMP at followup visit.  Edema- increase her Lasix to twice a day for the next 2-3 day schedule extra fluid off and see if that helps. If she finds she is needing to take an extra dose more frequently then we may need to consider bumping up her dose permanently. We will then need to follow her potassium very closely. Recheck BMP at followup visit. Note, she never went for her thyroid checked in January. I will prevent a new lab slip today. Last level was in August which was therapeutic.

## 2011-11-28 NOTE — Telephone Encounter (Signed)
Call pt: Let her know that she needs to check TSH.

## 2011-11-28 NOTE — Telephone Encounter (Signed)
Pt.notified

## 2011-12-01 ENCOUNTER — Ambulatory Visit: Payer: Medicare Other | Admitting: Physical Medicine & Rehabilitation

## 2011-12-01 DIAGNOSIS — S82843A Displaced bimalleolar fracture of unspecified lower leg, initial encounter for closed fracture: Secondary | ICD-10-CM | POA: Diagnosis not present

## 2011-12-01 DIAGNOSIS — IMO0001 Reserved for inherently not codable concepts without codable children: Secondary | ICD-10-CM | POA: Diagnosis not present

## 2011-12-05 ENCOUNTER — Encounter: Payer: Self-pay | Admitting: Physical Medicine & Rehabilitation

## 2011-12-05 ENCOUNTER — Encounter: Payer: Medicare Other | Attending: Physical Medicine & Rehabilitation

## 2011-12-05 ENCOUNTER — Ambulatory Visit (HOSPITAL_BASED_OUTPATIENT_CLINIC_OR_DEPARTMENT_OTHER): Payer: Medicare Other | Admitting: Physical Medicine & Rehabilitation

## 2011-12-05 VITALS — BP 147/88 | HR 70 | Ht 67.0 in | Wt 210.0 lb

## 2011-12-05 DIAGNOSIS — E1149 Type 2 diabetes mellitus with other diabetic neurological complication: Secondary | ICD-10-CM | POA: Insufficient documentation

## 2011-12-05 DIAGNOSIS — G608 Other hereditary and idiopathic neuropathies: Secondary | ICD-10-CM | POA: Insufficient documentation

## 2011-12-05 DIAGNOSIS — E1142 Type 2 diabetes mellitus with diabetic polyneuropathy: Secondary | ICD-10-CM | POA: Diagnosis not present

## 2011-12-05 DIAGNOSIS — G600 Hereditary motor and sensory neuropathy: Secondary | ICD-10-CM | POA: Diagnosis not present

## 2011-12-05 MED ORDER — GABAPENTIN 300 MG PO CAPS: 300.0000 mg | ORAL_CAPSULE | Freq: Three times a day (TID) | ORAL | Status: DC

## 2011-12-05 MED ORDER — TRAMADOL HCL 50 MG PO TABS: 100.0000 mg | ORAL_TABLET | Freq: Three times a day (TID) | ORAL | Status: DC | PRN

## 2011-12-05 MED ORDER — ACETAMINOPHEN-CODEINE #3 300-30 MG PO TABS: 1.5000 | ORAL_TABLET | Freq: Every day | ORAL | Status: DC

## 2011-12-05 NOTE — Patient Instructions (Signed)
Your codeine prescription we'll printout the other prescriptions will go straight to the pharmacy I will give you a special prescription for the left ankle brace and it will have the instructions were to go to get this filled

## 2011-12-05 NOTE — Progress Notes (Signed)
  Subjective:    Patient ID: [name], female    DOB: 1945/10/05, 66 y.o.   MRN: [id number]  HPI HISTORY: A 66 year old female with Charcot-Marie-Tooth plus diabetic  neuropathy, who was last seen by me December 05, 2010. She has had no  falls. She uses a walker to ambulate. She no longer drives. She is  independent with her basic self-care needs, some help with certain  household duties and shopping.  Other past medical history significant for lumbar spondylosis, has had  good results with facet injections in the past Fell in January and broke left ankle. Patient had surgery in [place]. Plates and screws were placed Pain Inventory Average Pain 3 Pain Right Now 3 My pain is intermittent, sharp, dull and stabbing  In the last 24 hours, has pain interfered with the following? General activity 4 Relation with others 0 Enjoyment of life 5 What TIME of day is your pain at its worst? through out the day Sleep (in general) Fair  Pain is worse with: walking and some activites Pain improves with: rest and medication Relief from Meds: 5  Mobility use a walker how many minutes can you walk? 5-10 min ability to climb steps?  no do you drive?  no  Function retired  Neuro/Psych dizziness depression  Prior Studies Any changes since last visit?  no  Physicians involved in your care Any changes since last visit?  yes      Review of Systems  Respiratory: Positive for shortness of breath.   Musculoskeletal: Positive for joint swelling.  Neurological: Positive for dizziness.  Psychiatric/Behavioral: Positive for dysphoric mood.       Objective:   Physical Exam  Constitutional: She is oriented to person, place, and time. She appears well-developed and well-nourished.  Neurological: She is alert and oriented to person, place, and time. She has normal reflexes.  Psychiatric: She has a normal mood and affect. Her behavior is normal. Thought content normal.   Pinprick  sensation is reduced in the left foot, proprioception is reduced the left foot Proprioception and current prick are preserved in the right foot as well as in both hands Gait shows a foot slap gait on the left side. There is mild tendency towards toe drag on the right side. Upper extremity strength is normal       Assessment & Plan:  1. 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 She's had progressive weakness and is now at a greater risk for falls. She has fallen and fractured the left ankle. I would recommend an ankle foot orthosis to prevent the foot from dragging and prevent falls. We ordered gabapentin Reorder tramadol Reorder Tylenol 3

## 2011-12-11 ENCOUNTER — Other Ambulatory Visit: Payer: Self-pay | Admitting: Family Medicine

## 2011-12-13 ENCOUNTER — Other Ambulatory Visit: Payer: Self-pay | Admitting: Family Medicine

## 2011-12-20 ENCOUNTER — Other Ambulatory Visit: Payer: Self-pay | Admitting: Family Medicine

## 2012-01-03 ENCOUNTER — Other Ambulatory Visit: Payer: Self-pay | Admitting: Family Medicine

## 2012-01-09 ENCOUNTER — Ambulatory Visit (INDEPENDENT_AMBULATORY_CARE_PROVIDER_SITE_OTHER): Payer: Medicare Other | Admitting: Family Medicine

## 2012-01-09 ENCOUNTER — Encounter: Payer: Self-pay | Admitting: Family Medicine

## 2012-01-09 VITALS — BP 92/57 | HR 54 | Ht 67.0 in | Wt 208.0 lb

## 2012-01-09 DIAGNOSIS — Z23 Encounter for immunization: Secondary | ICD-10-CM

## 2012-01-09 DIAGNOSIS — E119 Type 2 diabetes mellitus without complications: Secondary | ICD-10-CM | POA: Diagnosis not present

## 2012-01-09 DIAGNOSIS — Z9181 History of falling: Secondary | ICD-10-CM

## 2012-01-09 DIAGNOSIS — I10 Essential (primary) hypertension: Secondary | ICD-10-CM

## 2012-01-09 DIAGNOSIS — F17200 Nicotine dependence, unspecified, uncomplicated: Secondary | ICD-10-CM

## 2012-01-09 DIAGNOSIS — Z1331 Encounter for screening for depression: Secondary | ICD-10-CM

## 2012-01-09 DIAGNOSIS — Z720 Tobacco use: Secondary | ICD-10-CM

## 2012-01-09 MED ORDER — NITROGLYCERIN 0.4 MG SL SUBL: 0.4000 mg | SUBLINGUAL_TABLET | SUBLINGUAL | Status: DC | PRN

## 2012-01-09 NOTE — Progress Notes (Addendum)
  Subjective:    Patient ID: [name], female    DOB: May 03, 1946, 66 y.o.   MRN: [id number]  HPI DM - sugars between 80-130. Dong well.  Taking Lantus 48 units in the AM. No lows.  No problems with her feet.  She says she has cut back on her smoking. She is also cut out all the caffeine out of her diet. Her daughter is here with her today.  HTN- She has dec her smoking and has cut out caffeine.  She is tolerating the amlodipine well. No SE. No SOB. Did have some CP last week.  Did have to use her NTG tabs.  Needs refill on NTG tabs.  She follows up regularly with her cardiologist. I did remind her that she has taken a second nitroglycerin that she does need to call 911.   Review of Systems     Objective:   Physical Exam  Constitutional: She is oriented to person, place, and time. She appears well-developed and well-nourished.  HENT:  Head: Normocephalic and atraumatic.  Cardiovascular: Normal rate, regular rhythm and normal heart sounds.   Pulmonary/Chest: Effort normal and breath sounds normal.  Musculoskeletal: She exhibits edema.       Left ankle with trace edema.   Neurological: She is alert and oriented to person, place, and time.  Skin: Skin is warm and dry.  Psychiatric: She has a normal mood and affect. Her behavior is normal.          Assessment & Plan:  DM- Well controlled.  Her A1c looks fantastic today. Can keep up the good work. Followup in 3 months. Continue with Lantus.  HTN - very well controlled today. She's tolerating the amlodipine well. In fact it actually a little too low today. I encouraged her to cut her amlodipine in half and track her blood pressures at home. If her blood pressure is staying under 100 she needs to go ahead and call me we will stop the amlodipine. I congratulated her on her changes of stopping her caffeine and decreasing her cigarette use. F/U in 3 months.   Pneumonia vaccine updated today.  Tob abuse - continue to work on smoking  cessation. She has been able to cut back.   Depression screen-PHQ 9 score of 2, negative for depression.  Fall assessment rule-score of 14, high risk for falls. Will female handout to home to help reduce fall prevention in the home. At her followup visit we can also further discuss whether not it would be helpful to have home health come out and do a fall risk evaluation and assessment and see if she might even benefit from physical therapy.

## 2012-01-09 NOTE — Patient Instructions (Addendum)
Cut the amlodipine in half.  Keep up the good work! Home Safety and Preventing Falls Falls are a leading cause of injury and while they affect all age groups, falls have greater short-term and long-term impact on older age groups. However, falls should not be a part of life or aging. It is possible for individuals and their families to use preventive measures to significantly decrease the likelihood that anyone, especially an older adult, will fall. There are many simple measures which can make your home safer with respect to preventing falls. The following actions can help reduce falls among all members of your family and are especially important as you age, when your balance, lower limb strength, coordination, and eyesight may be declining. The use of preventive measures will help to reduce you and your family's risk of falls and serious medical consequences. OUTDOORS  Repair cracks and edges of walkways and driveways.   Remove high doorway thresholds and trim shrubbery on the main path into your home.   Ensure there is good outside lighting at main entrances and along main walkways.   Clear walkways of tools, rocks, debris, and clutter.   Check that handrails are not broken and are securely fastened. Both sides of steps should have handrails.   In the garage, be attentive to and clean up grease or oil spills on the cement. This can make the surface extremely slippery.   In winter, have leaves, snow, and ice cleared regularly.   Use sand or salt on walkways during winter months.  BATHROOM  Install grab bars by the toilet and in the tub and shower.   Use non-skid mats or decals in the tub or shower.   If unable to easily stand unsupported while showering, place a plastic non slip stool in the shower to sit on when needed.   Install night lights.   Keep floors dry and clean up all water on the floor immediately.   Remove soap buildup in tub or shower on a regular basis.   Secure  bath mats with non-slip, double-sided rug tape.   Remove tripping hazards from the floors.  BEDROOMS  Install night lights.   Do not use oversized bedding.   Make sure a bedside light is easy to reach.   Keep a telephone by your bedside.   Make sure that you can get in and out of your bed easily.   Have a firm chair, with side arms, to use for getting dressed.   Remove clutter from around closets.   Store clothing, bed coverings, and other household items where you can reach them comfortably.   Remove tripping hazards from the floor.  LIVING AREAS AND STAIRWAYS  Turn on lights to avoid having to walk through dark areas.   Keep lighting uniform in each room. Place brighter lightbulbs in darker areas, including stairways.   Replace lightbulbs that burn out in stairways immediately.   Arrange furniture to provide for clear pathways.   Keep furniture in the same place.   Eliminate or tape down electrical cables in high traffic areas.   Place handrails on both sides of stairways. Use handrails when going up or down stairs.   Most falls occur on the top or bottom 3 steps.   Fix any loose handrails. Make sure handrails on both sides of the stairways are as long as the stairs.   Remove all walkway obstacles.   Coil or tape electrical cords off to the side of walking areas and out  of the way. If using many extension cords, have an electrician put in a new wall outlet to reduce or eliminate them.   Make sure spills are cleaned up quickly and allow time for drying before walking on freshly cleaned floors.   Firmly attach carpet with non-skid or two-sided tape.   Keep frequently used items within easy reach.   Remove tripping hazards such as throw rugs and clutter in walkways. Never leave objects on stairs.   Get rid of throw rugs elsewhere if possible.   Eliminate uneven floor surfaces.   Make sure couches and chairs are easy to get into and out of.   Check carpeting  to make sure it is firmly attached along stairs.   Make repairs to worn or loose carpet promptly.   Select a carpet pattern that does not visually hide the edge of steps.   Avoid placing throw rugs or scatter rugs at the top or bottom of stairways, or properly secure with carpet tape to prevent slippage.   Have an electrician put in a light switch at the top and bottom of the stairs.   Get light switches that glow.   Avoid the following practices: hurrying, inattention, obscured vision, carrying large loads, and wearing slip-on shoes.   Be aware of all pets.  KITCHEN  Place items that are used frequently, such as dishes and food, within easy reach.   Keep handles on pots and pans toward the center of the stove. Use back burners when possible.   Make sure spills are cleaned up quickly and allow time for drying.   Avoid walking on wet floors.   Avoid hot utensils and knives.   Position shelves so they are not too high or low.   Place commonly used objects within easy reach.   If necessary, use a sturdy step stool with a grab bar when reaching.   Make sure electrical cables are out of the way.   Do not use floor polish or wax that makes floors slippery.  OTHER HOME FALL PREVENTION STRATEGIES  Wear low heel or rubber sole shoes that are supportive and fit well.   Wear closed toe shoes.   Know and watch for side effects of medications. Have your caregiver or pharmacist look at all your medicines, even over-the-counter medicines. Some medicines can make you sleepy or dizzy.   Exercise regularly. Exercise makes you stronger and improves your balance and coordination.   Limit use of alcohol.   Use eyeglasses if necessary and keep them clean. Have your vision checked every year.   Organize your household in a manner that minimizes the need to walk distances when hurried, or go up and down stairs unnecessarily. For example, have a phone placed on at least each floor of your  home. If possible, have a phone beside each sitting or lying area where you spend the most time at home. Keep emergency numbers posted at all phones.   Use non-skid floor wax.   When using a ladder, make sure:   The base is firm.   All ladder feet are on level ground.   The ladder is angled against the wall properly.   When climbing a ladder, face the ladder and hold the ladder rungs firmly.   If reaching, always keep your hips and body weight centered between the rails.   When using a stepladder, make sure it is fully opened and both spreaders are firmly locked.   Do not climb a closed stepladder.  Avoid climbing beyond the second step from the top of a stepladder and the 4th rung from the top of an extension ladder.   Learn and use mobility aids as needed.   Change positions slowly. Arise slowly from sitting and lying positions. Sit on the edge of your bed before getting to your feet.   If you have a history of falls, ask someone to add color or contrast paint or tape to grab bars and handrails in your home.   If you have a history of falls, ask someone to place contrasting color strips on first and last steps.   Install an electrical emergency response system if you need one, and know how to use it.   If you have a medical or other condition that causes you to have limited physical strength, it is important that you reach out to family and friends for occasional help.  FOR CHILDREN:  If young children are in the home, use safety gates. At the top of stairs use screw-mounted gates; use pressure-mounted gates for the bottom of the stairs and doorways between rooms.   Young children should be taught to descend stairs on their stomachs, feet first, and later using the handrail.   Keep drawers fully closed to prevent them from being climbed on or pulled out entirely.   Move chairs, cribs, beds and other furniture away from windows.   Consider installing window guards on  windows ground floor and up, unless they are emergency fire exits. Make sure they have easy release mechanisms.   Consider installing special locks that only allow the window to be opened to a certain height.   Never rely on window screens to prevent falls.   Never leave babies alone on changing tables, beds or sofas. Use a changing table that has a restraining strap.   When a child can pull to a standing position, the crib mattress should be adjusted to its lowest position. There should be at least 26 inches between the top rails of the crib drop side and the mattress. Toys, bumper pads, and other objects that can be used as steps to climb out should be removed from the crib.   On bunk beds never allow a child under age 40 to sleep on the top bunk. For older children, if the upper bunk is not against a wall, use guard rails on both sides. No matter how old a child is, keep the guard rails in place on the top bunk since children roll during sleep. Do not permit horseplay on bunks.   Grass and soil surfaces beneath backyard playground equipment should be replaced with hardwood chips, shredded wood mulch, sand, pea gravel, rubber, crushed stone, or another safer material at depths of at least 9 to 12 inches.   When riding bikes or using skates, skateboards, skis, or snowboards, require children to wear helmets. Look for those that have stickers stating that they meet or exceed safety standards.   Vertical posts or pickets in deck, balcony, and stairway railings should be no more than 3 1/2 inches apart if a young baby will have access to the area. The space between horizontal rails or bars, and between the floor and the first horizontal rail or bar, should be no more than 3 1/2 inches.  Document Released: 07/14/2002 Document Revised: 07/13/2011 Document Reviewed: 05/13/2009 [facility] Patient Information 2012 [place], [place].

## 2012-01-10 ENCOUNTER — Other Ambulatory Visit: Payer: Self-pay

## 2012-01-10 MED ORDER — ACETAMINOPHEN-CODEINE #3 300-30 MG PO TABS: 1.5000 | ORAL_TABLET | Freq: Every day | ORAL | Status: DC

## 2012-01-15 ENCOUNTER — Other Ambulatory Visit: Payer: Self-pay | Admitting: Family Medicine

## 2012-01-18 ENCOUNTER — Telehealth: Payer: Self-pay

## 2012-01-18 NOTE — Telephone Encounter (Signed)
Pt request refill on her tylenol with codeine called into [facility].

## 2012-01-18 NOTE — Telephone Encounter (Signed)
Script has been approved and pharmacy aware.  Lm informing pt script has been called in.

## 2012-01-24 ENCOUNTER — Other Ambulatory Visit: Payer: Self-pay | Admitting: Family Medicine

## 2012-02-15 ENCOUNTER — Other Ambulatory Visit: Payer: Self-pay | Admitting: Family Medicine

## 2012-02-22 ENCOUNTER — Other Ambulatory Visit: Payer: Self-pay | Admitting: *Deleted

## 2012-02-22 MED ORDER — ACETAMINOPHEN-CODEINE #3 300-30 MG PO TABS: 1.5000 | ORAL_TABLET | Freq: Every day | ORAL | Status: DC

## 2012-02-28 ENCOUNTER — Other Ambulatory Visit: Payer: Self-pay | Admitting: Family Medicine

## 2012-03-05 ENCOUNTER — Ambulatory Visit: Payer: Medicare Other | Admitting: Physical Medicine & Rehabilitation

## 2012-03-06 ENCOUNTER — Other Ambulatory Visit: Payer: Self-pay | Admitting: Family Medicine

## 2012-03-22 ENCOUNTER — Encounter: Payer: Medicare Other | Attending: Physical Medicine & Rehabilitation

## 2012-03-22 ENCOUNTER — Encounter: Payer: Self-pay | Admitting: Physical Medicine & Rehabilitation

## 2012-03-22 ENCOUNTER — Ambulatory Visit (HOSPITAL_BASED_OUTPATIENT_CLINIC_OR_DEPARTMENT_OTHER): Payer: Medicare Other | Admitting: Physical Medicine & Rehabilitation

## 2012-03-22 DIAGNOSIS — E1149 Type 2 diabetes mellitus with other diabetic neurological complication: Secondary | ICD-10-CM | POA: Insufficient documentation

## 2012-03-22 DIAGNOSIS — M47816 Spondylosis without myelopathy or radiculopathy, lumbar region: Secondary | ICD-10-CM | POA: Insufficient documentation

## 2012-03-22 DIAGNOSIS — M47817 Spondylosis without myelopathy or radiculopathy, lumbosacral region: Secondary | ICD-10-CM

## 2012-03-22 DIAGNOSIS — E1142 Type 2 diabetes mellitus with diabetic polyneuropathy: Secondary | ICD-10-CM | POA: Diagnosis not present

## 2012-03-22 DIAGNOSIS — G600 Hereditary motor and sensory neuropathy: Secondary | ICD-10-CM

## 2012-03-22 MED ORDER — GABAPENTIN 400 MG PO CAPS: 400.0000 mg | ORAL_CAPSULE | Freq: Three times a day (TID) | ORAL | Status: DC

## 2012-03-22 NOTE — Patient Instructions (Addendum)
Next visit will be for medial branch blocks. You'll need a driver. Neurontin will be increased to 400 mg 3 times per day. This is the same as gabapentin. Take care tramadol 2 tablets 3 times per day..  Call [facility] about your brace

## 2012-03-22 NOTE — Progress Notes (Signed)
Subjective:    Patient ID: [name], female    DOB: 27-Jun-1946, 66 y.o.   MRN: [id number]  HPI HISTORY: A 66 year old female with Charcot-Marie-Tooth plus diabetic  neuropathy, who was last seen by me December 05, 2010. She has had no  falls. She uses a walker to ambulate. She no longer drives. She is  independent with her basic self-care needs, some help with certain  household duties and shopping.  Other past medical history significant for lumbar spondylosis, has had  good results with facet injections in the past  Fell in January and broke left ankle. Patient had surgery in [place]. Plates and screws were placed Pain Inventory Average Pain 2 Pain Right Now 2 My pain is intermittent, sharp, dull and stabbing  In the last 24 hours, has pain interfered with the following? General activity 2 Relation with others 0 Enjoyment of life 0 What TIME of day is your pain at its worst? varies Sleep (in general) Good  Pain is worse with: some activites Pain improves with: medication Relief from Meds: 1  Mobility use a cane use a walker how many minutes can you walk? 10-15 ability to climb steps?  no do you drive?  no  Function retired I need assistance with the following:  household duties and shopping  Neuro/Psych numbness tremor tingling trouble walking dizziness  Prior Studies Any changes since last visit?  no  Physicians involved in your care Any changes since last visit?  no   Family History  Problem Relation Age of Onset  . Stroke Mother   . Heart disease Father 79    heart attack  . Hypertension Father   . Cancer Father     lung  . Diabetes Paternal Aunt     insulin dependent   History   Social History  . Marital Status: Widowed    Spouse Name: N/A    Number of Children: N/A  . Years of Education: N/A   Social History Main Topics  . Smoking status: Current Everyday Smoker -- 0.5 packs/day for 49 years    Types: Cigarettes  . Smokeless  tobacco: None   Comment: has decreased to 2-3 cigarettes/day  . Alcohol Use: No  . Drug Use: No  . Sexually Active: None   Other Topics Concern  . None   Social History Narrative  . None   Past Surgical History  Procedure Date  . Cholecystectomy   . Replacement total knee   . Fluoroscopic l5 dorsal medial branch bloc   . Bilateral l3 medial branch block   . Fibroid tumor removal   . Renal artery stent 11/28/2010    left   Past Medical History  Diagnosis Date  . Hypertension   . Hyperlipidemia   . PUD (peptic ulcer disease)   . Charcot-Marie-Tooth disease   . Arthritis   . Neurogenic bladder   . Diabetic peripheral neuropathy   . Osteopenia   . Post-menopausal   . Insomnia   . COPD (chronic obstructive pulmonary disease)   . DDD (degenerative disc disease)     Lumbar and lumbosacral  . Tobacco dependence   . Thyroid disease     hypo  . Gastric ulcer     w/o hemorrhage  . Diabetes mellitus     type 2  . Depression   . Obesity   . Unspecified hereditary and idiopathic peripheral neuropathy   . Lumbosacral root lesions, not elsewhere classified   . Other symptoms referable to back   .  Thoracic spondylosis without myelopathy   . Facet syndrome, lumbar   . Spinal stenosis, lumbar region, without neurogenic claudication    There were no vitals taken for this visit.    Review of Systems  Musculoskeletal: Positive for back pain and gait problem.       Leg pain  Neurological: Positive for dizziness, tremors and numbness.       Tingling  Hematological: Bruises/bleeds easily.  All other systems reviewed and are negative.       Objective:   Physical Exam  Physical Exam  Constitutional: She is oriented to person, place, and time. She appears well-developed and well-nourished.  Neurological: She is alert and oriented to person, place, and time. She has normal reflexes.  Psychiatric: She has a normal mood and affect. Her behavior is normal. Thought content  normal.   Pinprick sensation is reduced in the left foot, proprioception is reduced the left foot  Proprioception and current prick are preserved in the right foot as well as in both hands  Gait shows a foot slap gait on the left side. There is mild tendency towards toe drag on the right side.  Upper extremity strength is normal Motor strength is 0/5 in the left ankle and 3 minus/5 and right ankle 4 minus/5 in bilateral knee extensors      Assessment & Plan:  1. 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 She's had progressive weakness and is now at a greater risk for falls. She has fallen and fractured the left ankle. I would recommend an ankle foot orthosis to prevent the foot from dragging and prevent falls. She has been measured but did not receive her AFO. I instructed her to call the orthotist to check up on this We ordered gabapentin and increased it to 400 mg 3 times per day Reorder tramadol   2.  Lumbar spondylosis Schedule for MBB

## 2012-03-27 ENCOUNTER — Other Ambulatory Visit: Payer: Self-pay | Admitting: Family Medicine

## 2012-03-28 ENCOUNTER — Other Ambulatory Visit: Payer: Self-pay | Admitting: *Deleted

## 2012-03-28 MED ORDER — ACETAMINOPHEN-CODEINE #3 300-30 MG PO TABS: 1.5000 | ORAL_TABLET | Freq: Every day | ORAL | Status: DC

## 2012-04-11 ENCOUNTER — Encounter: Payer: Self-pay | Admitting: Family Medicine

## 2012-04-11 ENCOUNTER — Ambulatory Visit (INDEPENDENT_AMBULATORY_CARE_PROVIDER_SITE_OTHER): Payer: Medicare Other | Admitting: Family Medicine

## 2012-04-11 VITALS — BP 104/57 | HR 49 | Wt 210.0 lb

## 2012-04-11 DIAGNOSIS — E119 Type 2 diabetes mellitus without complications: Secondary | ICD-10-CM

## 2012-04-11 DIAGNOSIS — Z1231 Encounter for screening mammogram for malignant neoplasm of breast: Secondary | ICD-10-CM

## 2012-04-11 DIAGNOSIS — N951 Menopausal and female climacteric states: Secondary | ICD-10-CM

## 2012-04-11 DIAGNOSIS — I10 Essential (primary) hypertension: Secondary | ICD-10-CM

## 2012-04-11 DIAGNOSIS — F17200 Nicotine dependence, unspecified, uncomplicated: Secondary | ICD-10-CM | POA: Diagnosis not present

## 2012-04-11 DIAGNOSIS — Z720 Tobacco use: Secondary | ICD-10-CM

## 2012-04-11 NOTE — Progress Notes (Signed)
  Subjective:    Patient ID: [name], female    DOB: [birth year], 66 y.o.   MRN: [id number]  HPI DM- - Doing well.  No hypoglycemic events. No wounds on her feet that are not healing well. She had her diabetic eye exam about 2 weeks ago.  HTN - No CP.  Was SOB aobut 5 days ago when it was so humid outside.  She is taking her medications regularly.  Has been having problems with her hemorrhoids.  She was more constipated this week and then her hemorrhoids got more irritated and has some bleeding form her hemorrhoids.   Review of Systems     Objective:   Physical Exam  Constitutional: She is oriented to person, place, and time. She appears well-developed and well-nourished.  HENT:  Head: Normocephalic and atraumatic.  Cardiovascular: Normal rate, regular rhythm and normal heart sounds.   Pulmonary/Chest: Effort normal and breath sounds normal.  Neurological: She is alert and oriented to person, place, and time.  Skin: Skin is warm and dry.  Psychiatric: She has a normal mood and affect. Her behavior is normal.          Assessment & Plan:  DM-Well controlled. Continue current regimen. Keep up the good work. Followup in 3-4 months. Call if any concerns or starts having any hypoglycemic events.  HTN - Well controlled. Will stop her amlodipine. She was only taking 1/2 tab of the 5mg  of amlodipine.  I think her weight reduction as well as her stress reduction has significantly improved her blood pressure. Call if she starts to notice any elevated pressures.  Tob abuse - Wants to know if can try e-cigarrete. Encouragd her to do so.    She is overdue for her bone density as well as her mammogram. Orders placed today. She will go downstairs.

## 2012-04-11 NOTE — Patient Instructions (Addendum)
Stop your amlodipine

## 2012-04-15 ENCOUNTER — Ambulatory Visit (INDEPENDENT_AMBULATORY_CARE_PROVIDER_SITE_OTHER): Payer: Medicare Other

## 2012-04-15 DIAGNOSIS — Z13820 Encounter for screening for osteoporosis: Secondary | ICD-10-CM | POA: Diagnosis not present

## 2012-04-15 DIAGNOSIS — M810 Age-related osteoporosis without current pathological fracture: Secondary | ICD-10-CM

## 2012-04-15 DIAGNOSIS — Z1231 Encounter for screening mammogram for malignant neoplasm of breast: Secondary | ICD-10-CM | POA: Diagnosis not present

## 2012-04-15 DIAGNOSIS — M899 Disorder of bone, unspecified: Secondary | ICD-10-CM | POA: Diagnosis not present

## 2012-04-15 DIAGNOSIS — N951 Menopausal and female climacteric states: Secondary | ICD-10-CM

## 2012-04-15 DIAGNOSIS — M949 Disorder of cartilage, unspecified: Secondary | ICD-10-CM | POA: Diagnosis not present

## 2012-04-18 ENCOUNTER — Ambulatory Visit (HOSPITAL_BASED_OUTPATIENT_CLINIC_OR_DEPARTMENT_OTHER): Payer: Medicare Other | Admitting: Physical Medicine & Rehabilitation

## 2012-04-18 ENCOUNTER — Encounter: Payer: Self-pay | Admitting: Physical Medicine & Rehabilitation

## 2012-04-18 ENCOUNTER — Encounter: Payer: Medicare Other | Attending: Physical Medicine & Rehabilitation

## 2012-04-18 ENCOUNTER — Encounter: Payer: Self-pay | Admitting: Vascular Surgery

## 2012-04-18 VITALS — BP 139/49 | HR 47 | Resp 16 | Ht 67.0 in | Wt 205.0 lb

## 2012-04-18 DIAGNOSIS — M47817 Spondylosis without myelopathy or radiculopathy, lumbosacral region: Secondary | ICD-10-CM | POA: Diagnosis not present

## 2012-04-18 DIAGNOSIS — E1142 Type 2 diabetes mellitus with diabetic polyneuropathy: Secondary | ICD-10-CM | POA: Diagnosis not present

## 2012-04-18 DIAGNOSIS — E1149 Type 2 diabetes mellitus with other diabetic neurological complication: Secondary | ICD-10-CM | POA: Diagnosis not present

## 2012-04-18 DIAGNOSIS — G600 Hereditary motor and sensory neuropathy: Secondary | ICD-10-CM | POA: Insufficient documentation

## 2012-04-18 DIAGNOSIS — M47816 Spondylosis without myelopathy or radiculopathy, lumbar region: Secondary | ICD-10-CM

## 2012-04-18 NOTE — Progress Notes (Signed)
  PROCEDURE RECORD [facility]   Name: [name] DOB:1945/11/01 MRN: [id number]  Date:04/18/2012  Physician: Claudette Laws, MD    Nurse/CMA: Kelli Churn, CMA/Shumaker RN  Allergies: No Known Allergies  Consent Signed: yes  Is patient diabetic? yes   Pregnant: no LMP: No LMP recorded. Patient is postmenopausal. (age 66-55)  Anticoagulants: yes (stopped taking) Anti-inflammatory: no Antibiotics: no  Procedure: Medical Branch Block Bilateral Position: Prone Start Time: 10:35  End Time: 10:43  Fluoro Time: 24  RN/CMA Levens,CMA Shumaker RN    Time 09:45am 10:50    BP 139/49 136/58    Pulse 47 46    Respirations 16 16    O2 Sat 95 95    S/S 6 6    Pain Level 8/10 3/10     D/C home with daughter-Diane, patient A & O X 3, D/C instructions reviewed, and sits independently.

## 2012-04-18 NOTE — Progress Notes (Signed)

## 2012-04-18 NOTE — Patient Instructions (Signed)
Facet Block A facet block is an injection procedure used to numb nerves near a spinal joint (facet). The injection usually includes a medicine like Novacaine (anesthetic) and a steroid medicine (similar to cortisone). The injections are made directly into the facet joint of the back. They are used for patients with several types of neck or back pain problems (such as worsening arthritis or persistent pain after surgery) that have not been helped with anti-inflammatory medications, exercise programs, physical therapy, and other forms of pain management. Multiple injections may be needed depending on how many joints are involved.  A facet block procedure can be helpful with diagnosis as well as providing therapeutic pain relief. One of three things may happen after the procedure:  The pain does not go away. This can mean that the pain is probably not coming from blocked facet joints. This information is helpful with diagnosis.   The pain goes away and stays away for a few hours but the original pain comes back and does not get better again. This information is also helpful with diagnosis. It can mean that pain is probably coming from the joints; but the steroid was not helpful for longer term pain control.   The pain goes away after the block, then returns later that day, and then gets better again over the next few days. This can mean that the block was helpful for pain control and the steroid had a longer lasting effect.  If there is good, lasting benefit from the injections, the block may be repeated from 3 to 5 times. If there is good relief but it is only of short-term benefit, other procedures (such as radiofrequency lesioning) may be considered.  Note: The procedure cannot be performed if you have an active infection, a lesion on or near the area of injection, flu, cold, fever, very high blood pressure or if you are on blood thinners. Please make your doctor aware of any of these conditions. This is  for your safety!  LET YOUR CAREGIVER KNOW ABOUT:   Allergies.   Medications taken including herbs, eye drops, over the counter medications, and creams   Use of steroids (by mouth or creams).   Possible pregnancy, if applicable.   Previous problems with anesthetics or Novocaine.   History of blood clots.   History of bleeding or blood problems.   Previous surgery, particularly of the neck and/or back   Other health problems.  RISKS AND COMPLICATIONS These are very uncommon but include:  Bleeding.   Injury to a nerve near the injection site.   Weakness or numbness in areas controlled by nerves near the injection site.   Infection.   Pain at the site of the injection.   Temporary fluid retention in those who are prone to this problem.   Allergic reaction to anesthetics or medicines used during the procedure.  Diabetics may have a temporary increase in their blood sugar after any surgical procedure, especially if steroids are used. Stinging/burning of the numbing medicine is the most uncomfortable part of the procedure; however every person's response to any procedure is individual.  BEFORE THE PROCEDURE   Your caregiver will provide instructions about stopping any medication before the procedure.   Unless advised otherwise, if the injections are in your neck, you may take your medications as usual with a sip of water but do not eat or drink for 6 hours before the procedure.   Unless advised otherwise, you may eat, drink and take your medications as   usual on the day of the procedure (both before and after) if the injections are to be in your lower back.   There is no other specific preparation necessary unless advised otherwise.  PROCEDURE After checking your blood pressure, the procedure will be done in the x-ray (fluoroscopy) room while lying on your stomach. For procedures in the neck, an intravenous line is usually started. The back is then cleansed with an antiseptic  soap. Sterile drapes are placed in this area. The skin is numbed with a local anesthetic. This is felt as a stinging or burning sensation. Using x-ray guidance, needles are then advanced to the appropriate locations. Once the needles are in the proper location, the anesthetic and steroid is injected through the needles and the needles are removed. The skin is then cleansed and bandages are applied. Blood pressure will be checked again, and you will be discharged to leave with your ride after your caregiver says it is okay to go.  AFTER THE PROCEDURE  You may not drive for the remainder of the day after your procedure. An adult must be present to drive you home or to go with you in a taxi or on public transportation. The procedure will be canceled if you do not have a responsible adult with you! This is for your safety.  HOME CARE INSTRUCTIONS   The bandages noted above can be removed on the morning after the procedure.   Resume medications according to your caregiver's instructions.   No heat is to be used near or over the injected area(s) for the remainder of the day.   No tub bath or soaking in water (such as a pool, jacuzzi, etc.) for the remainder of the day.   Some local tenderness may be experienced for a couple of days after the injection. Using an ice pack three or four times a day will help this.   Keep track of the amount of pain relief as well as how long the pain relief lasted.  SEEK MEDICAL CARE IF:   There is drainage from the injection site.   Pain is not controlled with medications prescribed.   There is significant bleeding or swelling.  SEEK IMMEDIATE MEDICAL CARE IF:   You develop a fever of 101 F (38.3 C) or greater.   Worsening pain, swelling, and/or red streaking develops in the skin around the injection site.   Severe pain develops and cannot be controlled with medications prescribed.   You develop any headache, stiff neck, nausea, vomiting, or your eyes become  very sensitive to light.   Weakness or paralysis develops in arms or legs not present before the procedure.   You develop difficulty urinating or difficulty breathing.  Document Released: 12/13/2006 Document Revised: 07/13/2011 Document Reviewed: 12/03/2008 [employer] Patient Information 2012 [employer]. 

## 2012-04-19 ENCOUNTER — Ambulatory Visit (INDEPENDENT_AMBULATORY_CARE_PROVIDER_SITE_OTHER): Payer: Medicare Other | Admitting: Vascular Surgery

## 2012-04-19 ENCOUNTER — Other Ambulatory Visit (INDEPENDENT_AMBULATORY_CARE_PROVIDER_SITE_OTHER): Payer: Medicare Other | Admitting: *Deleted

## 2012-04-19 ENCOUNTER — Encounter: Payer: Self-pay | Admitting: Vascular Surgery

## 2012-04-19 VITALS — BP 182/74 | HR 58 | Resp 16 | Ht 67.0 in | Wt 205.0 lb

## 2012-04-19 DIAGNOSIS — I701 Atherosclerosis of renal artery: Secondary | ICD-10-CM

## 2012-04-19 DIAGNOSIS — Z48812 Encounter for surgical aftercare following surgery on the circulatory system: Secondary | ICD-10-CM

## 2012-04-19 DIAGNOSIS — I714 Abdominal aortic aneurysm, without rupture: Secondary | ICD-10-CM | POA: Diagnosis not present

## 2012-04-19 DIAGNOSIS — I77811 Abdominal aortic ectasia: Secondary | ICD-10-CM | POA: Diagnosis not present

## 2012-04-19 DIAGNOSIS — I723 Aneurysm of iliac artery: Secondary | ICD-10-CM | POA: Diagnosis not present

## 2012-04-19 NOTE — Progress Notes (Signed)
[facility] [place]   Established Renal Artery Stenosis   History of Present Illness  [name] is a 66 y.o. female who presents with chief complaint: follow up on renal artery stenosis. Pt underwent L RAS 11/28/10. Previous renal duplex completed on 03/31/11 reveals: R RA stenosis: <60% and L RA stenosis: <60%.  The patient's blood pressure has been hypotensive recently, leading removal of one medication.  The patient's urinary history has remained stable. Additionally, the patient has known R CIA aneurysm. She notes no symptoms from this R CIA aneurysm.   Past Medical History, Past Surgical History, Social History, Family History, Medications, Allergies, and Review of Systems are unchanged from previous evaluation on 10/20/11.   Physical Examination  Filed Vitals:   04/19/12 1101  BP: 182/74  Pulse: 58  Resp: 16  Height: 5\' 7"  (1.702 m)  Weight: 205 lb (92.987 kg)  SpO2: 98%   General: A&O x 3, WDWN, obese  Pulmonary: Sym exp, good air movt, CTAB, no rales, rhonchi, & wheezing  Cardiac: RRR, Nl S1, S2, no Murmurs, rubs or gallops  Vascular:  Vessel  Right  Left   Radial  Palpable  Palpable   Ulnary  Palpable  Palpable   Brachial  Palpable  Palpable   Carotid  Palpable, without bruit  Palpable, without bruit   Aorta  Non-palpable  N/A   Femoral  Palpable  Palpable   Popliteal  Non-palpable  Non-palpable   PT  Non-Palpable  Non-Palpable   DP  Non-Palpable  Non-Palpable   Gastrointestinal: soft, NTND, -G/R, - HSM, - masses, - CVAT B  Musculoskeletal: M/S 5/5 throughout, Extremities without ischemic changes, BLE with CVI signs, B leg without edema today Neurologic: Light touch decreased in both legs up to mid-shin , Motor exam as listed above   Non-Invasive Vascular Imaging  Aorta Duplex (Date: 04/19/12)  Aorta: 3.3 cm x 2.7 cm   R CIA: 2.3 cm x 1.3 cm  L CIA: 0.8 cm x 0.8 cm  Renal Duplex (Date: 03/31/11)  R kidney size: 9.8 cm  L kidney size: 9.3  cm  R RA stenosis: <60%%  L RA stenosis: <60%  Widely patent L RAS  Medical Decision Making  [name] is a 66 y.o. 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  Based on the patient's vascular studies and examination, I recommended continued B renal artery duplex, Aortoiliac duplex, and Aortic duplex in 12 months.  She will follow up with our NP at that time. The patient's aneurysms are not big enough to justify repair yet.   I discussed in depth with the patient the nature of atherosclerosis, and emphasized the importance of maximal medical management including strict control of blood pressure, blood glucose, and lipid levels, obtaining regular exercise, and cessation of smoking. The patient is aware that without maximal medical management the underlying atherosclerotic disease process will progress, limiting the benefit of any interventions.  The patient will follow with [place] in 6 months with the above studies.  Thank you for allowing [place] to participate in this patient's care.  Leonides Sake, MD  [facility]  Office: [phone]  Pager: [phone]

## 2012-04-22 ENCOUNTER — Other Ambulatory Visit: Payer: Self-pay | Admitting: Family Medicine

## 2012-04-24 ENCOUNTER — Telehealth: Payer: Self-pay | Admitting: *Deleted

## 2012-04-24 ENCOUNTER — Other Ambulatory Visit: Payer: Self-pay | Admitting: Family Medicine

## 2012-04-24 MED ORDER — AMLODIPINE BESYLATE 5 MG PO TABS: 2.5000 mg | ORAL_TABLET | Freq: Every day | ORAL | Status: DC

## 2012-04-24 NOTE — Telephone Encounter (Signed)
Ok to restart half tab of amlodipine.  That is the only thing we stopped.  F/U in 1-2 week for BP check.  Can send new rx if she doesn't have any left.

## 2012-04-24 NOTE — Telephone Encounter (Signed)
Pt stopped taking BP med as instructed and states that she has had a HA for days and that her BP at Dr. Nicky Pugh office yesterday was 182/74. States she feels she is retaining fluid in her torso area. She takes Lasix 40mg  daily. Please advise.

## 2012-04-24 NOTE — Telephone Encounter (Signed)
Pt informed and med refill sent to pharmacy

## 2012-05-04 ENCOUNTER — Other Ambulatory Visit: Payer: Self-pay | Admitting: Family Medicine

## 2012-05-06 ENCOUNTER — Ambulatory Visit (INDEPENDENT_AMBULATORY_CARE_PROVIDER_SITE_OTHER): Payer: Medicare Other | Admitting: Family Medicine

## 2012-05-06 ENCOUNTER — Encounter: Payer: Self-pay | Admitting: Family Medicine

## 2012-05-06 VITALS — BP 149/82 | HR 60 | Wt 203.0 lb

## 2012-05-06 DIAGNOSIS — R609 Edema, unspecified: Secondary | ICD-10-CM

## 2012-05-06 DIAGNOSIS — I10 Essential (primary) hypertension: Secondary | ICD-10-CM | POA: Diagnosis not present

## 2012-05-06 DIAGNOSIS — R600 Localized edema: Secondary | ICD-10-CM

## 2012-05-06 DIAGNOSIS — Z23 Encounter for immunization: Secondary | ICD-10-CM | POA: Diagnosis not present

## 2012-05-06 NOTE — Progress Notes (Signed)
Subjective:    Patient ID: [name], female    DOB: 1945-12-13, 66 y.o.   MRN: [id number]  HPI Bilat lower extemity swelling x 1 week. Worse on the left.  Says not changes.  No inc in salt.  N odysuria or frequency. Sometimes hand swelling in the AM.  No change or increase in SOB. No CP ro palpitations. Says has been more constipated on the incease in her tyelnol #3. Juste started a stool softerner.     Review of Systems BP 149/82  Pulse 60  Wt 203 lb (92.08 kg)    No Known Allergies  Past Medical History  Diagnosis Date  . Hypertension   . Hyperlipidemia   . PUD (peptic ulcer disease)   . Charcot-Marie-Tooth disease   . Arthritis   . Neurogenic bladder   . Diabetic peripheral neuropathy   . Osteopenia   . Post-menopausal   . Insomnia   . COPD (chronic obstructive pulmonary disease)   . DDD (degenerative disc disease)     Lumbar and lumbosacral  . Tobacco dependence   . Thyroid disease     hypo  . Gastric ulcer     w/o hemorrhage  . Diabetes mellitus     type 2  . Depression   . Obesity   . Unspecified hereditary and idiopathic peripheral neuropathy   . Lumbosacral root lesions, not elsewhere classified   . Other symptoms referable to back   . Thoracic spondylosis without myelopathy   . Facet syndrome, lumbar   . Spinal stenosis, lumbar region, without neurogenic claudication     Past Surgical History  Procedure Date  . Cholecystectomy   . Replacement total knee   . Fluoroscopic l5 dorsal medial branch bloc   . Bilateral l3 medial branch block   . Fibroid tumor removal   . Renal artery stent 11/28/2010    left  . Ankle reconstruction     left ankle, plate and pins     History   Social History  . Marital Status: Widowed    Spouse Name: N/A    Number of Children: N/A  . Years of Education: N/A   Occupational History  . Not on file.   Social History Main Topics  . Smoking status: Current Every Day Smoker -- 1.0 packs/day for 49 years   Types: Cigarettes  . Smokeless tobacco: Never Used   Comment: pt states she is going to try the E-cig  . Alcohol Use: No  . Drug Use: No  . Sexually Active: Not on file   Other Topics Concern  . Not on file   Social History Narrative  . No narrative on file    Family History  Problem Relation Age of Onset  . Stroke Mother   . Heart disease Father 54    heart attack  . Hypertension Father   . Cancer Father     lung  . Diabetes Paternal Aunt     insulin dependent    Outpatient Encounter Prescriptions as of 05/06/2012  Medication Sig Dispense Refill  . acetaminophen-codeine (TYLENOL #3) 300-30 MG per tablet Take 1.5 tablets by mouth daily.  45 tablet  0  . amLODipine (NORVASC) 5 MG tablet Take 0.5 tablets (2.5 mg total) by mouth daily.  30 tablet  1  . aspirin EC 81 MG tablet Take 81 mg by mouth daily.      . Cholecalciferol (VITAMIN D3) 50000 UNITS CAPS Take 50,000 Int'l Units by mouth every Sunday.  15 capsule  0  . clopidogrel (PLAVIX) 75 MG tablet TAKE 1 TABLET DAILY  30 tablet  4  . furosemide (LASIX) 40 MG tablet TAKE (1) TABLET DAILY IN THE MORNING.  30 tablet  2  . gabapentin (NEURONTIN) 400 MG capsule Take 1 capsule (400 mg total) by mouth 3 (three) times daily.  90 capsule  5  . glucose blood test strip 1 each as needed. Use as instructed       . Lancets MISC by Does not apply route.        Marland Kitchen LANTUS SOLOSTAR 100 UNIT/ML injection Inject 48 Units into the skin daily.  15 mL  1  . levothyroxine (SYNTHROID, LEVOTHROID) 150 MCG tablet TAKE 1 TABLET DAILY ON EMPTY STOMACH  30 tablet  2  . LOPRESSOR 100 MG tablet TAKE 1 TABLET TWICE DAILY  60 tablet  0  . LOTENSIN 20 MG tablet TAKE 1 TABLET DAILY.  30 tablet  3  . metFORMIN (GLUCOPHAGE-XR) 500 MG 24 hr tablet TAKE 2 TABLETS DAILY WITH EVENING MEAL  60 tablet  2  . nitroGLYCERIN (NITROSTAT) 0.4 MG SL tablet Place 1 tablet (0.4 mg total) under the tongue every 5 (five) minutes as needed.  10 tablet  1  . simvastatin (ZOCOR)  20 MG tablet TAKE 1 AT BEDTIME  90 tablet  2  . traMADol (ULTRAM) 50 MG tablet Take 2 tablets (100 mg total) by mouth every 8 (eight) hours as needed for pain. Take 2 tabs 3 times/ day  180 tablet  5  . traZODone (DESYREL) 100 MG tablet TAKE 1 TABLET AT BEDTIME.  30 tablet  0  . VENTOLIN HFA 108 (90 BASE) MCG/ACT inhaler Inhale 2 puffs into the lungs every 6 (six) hours as needed for wheezi ng.  18 each  3          Objective:   Physical Exam  Constitutional: She is oriented to person, place, and time. She appears well-developed and well-nourished.  HENT:  Head: Normocephalic and atraumatic.  Cardiovascular: Normal rate, regular rhythm and normal heart sounds.   Pulmonary/Chest: Effort normal and breath sounds normal.  Musculoskeletal:       Trace edema on the left from the knee all the way down to her foot. Dorsal pedal pulse 2+ bilaterally. Posterior tibial pulse 2+ bilaterally. Both feet feel cool to touch. Toes with slightly erythematous but not with a well-demarcated margin. She has some trace edema in her right ankle, but not extending up the leg.   Neurological: She is alert and oriented to person, place, and time.  Skin: Skin is warm and dry.  Psychiatric: She has a normal mood and affect. Her behavior is normal.          Assessment & Plan:  LE swelling.- Unclear etiology at this point in time. Her exam is reassuring with normal pulses. We will evaluate her kidney function. No prior history kidney disease. She does have a history hypothyroidism to recheck her TSH to make sure it's not abnormal. We will also check her electrolytes as well as her liver function. No prior history of liver problems. We'll also check a d-dimer as well as a BNP that she's not having any chest pain or shortness of breath. We discussed eating a low salt diet. We may need to adjust her diuretic. She can take an extra Lasix pill today and tomorrow. She says she's been taking her medications  consistently.  Hypertension-all little elevated today. Recheck in 2-3  weeks.

## 2012-05-06 NOTE — Patient Instructions (Signed)
We will call you with your lab results. If you don't here from us in about a week then please give us a call at [phone].  

## 2012-05-07 ENCOUNTER — Other Ambulatory Visit: Payer: Self-pay | Admitting: Family Medicine

## 2012-05-07 ENCOUNTER — Encounter: Payer: Self-pay | Admitting: Family Medicine

## 2012-05-07 DIAGNOSIS — M7989 Other specified soft tissue disorders: Secondary | ICD-10-CM

## 2012-05-07 DIAGNOSIS — M79606 Pain in leg, unspecified: Secondary | ICD-10-CM

## 2012-05-07 DIAGNOSIS — M810 Age-related osteoporosis without current pathological fracture: Secondary | ICD-10-CM

## 2012-05-07 LAB — COMPLETE METABOLIC PANEL WITH GFR
ALT: 13 U/L (ref 0–35)
AST: 16 U/L (ref 0–37)
Alkaline Phosphatase: 75 U/L (ref 39–117)
Sodium: 139 mEq/L (ref 135–145)
Total Bilirubin: 0.9 mg/dL (ref 0.3–1.2)
Total Protein: 6.8 g/dL (ref 6.0–8.3)

## 2012-05-07 LAB — CBC WITH DIFFERENTIAL/PLATELET
Eosinophils Absolute: 0.1 10*3/uL (ref 0.0–0.7)
Eosinophils Relative: 1 % (ref 0–5)
Hemoglobin: 14.8 g/dL (ref 12.0–15.0)
Lymphs Abs: 1.8 10*3/uL (ref 0.7–4.0)
MCH: 30.6 pg (ref 26.0–34.0)
MCV: 92.1 fL (ref 78.0–100.0)
Monocytes Relative: 5 % (ref 3–12)
RBC: 4.84 MIL/uL (ref 3.87–5.11)

## 2012-05-07 LAB — BRAIN NATRIURETIC PEPTIDE: Brain Natriuretic Peptide: 58.8 pg/mL (ref 0.0–100.0)

## 2012-05-07 MED ORDER — ALENDRONATE SODIUM 70 MG PO TABS: 70.0000 mg | ORAL_TABLET | ORAL | Status: DC

## 2012-05-08 ENCOUNTER — Ambulatory Visit (HOSPITAL_BASED_OUTPATIENT_CLINIC_OR_DEPARTMENT_OTHER)
Admission: RE | Admit: 2012-05-08 | Discharge: 2012-05-08 | Disposition: A | Discharge status: Home / Self Care | Payer: Medicare Other | Source: Ambulatory Visit | Attending: Family Medicine | Admitting: Family Medicine

## 2012-05-08 ENCOUNTER — Ambulatory Visit (HOSPITAL_BASED_OUTPATIENT_CLINIC_OR_DEPARTMENT_OTHER): Payer: Medicare Other

## 2012-05-08 DIAGNOSIS — R791 Abnormal coagulation profile: Secondary | ICD-10-CM | POA: Insufficient documentation

## 2012-05-08 DIAGNOSIS — M7989 Other specified soft tissue disorders: Secondary | ICD-10-CM | POA: Diagnosis not present

## 2012-05-08 DIAGNOSIS — M79606 Pain in leg, unspecified: Secondary | ICD-10-CM

## 2012-05-10 ENCOUNTER — Other Ambulatory Visit: Payer: Self-pay | Admitting: *Deleted

## 2012-05-10 MED ORDER — ACETAMINOPHEN-CODEINE #3 300-30 MG PO TABS: 1.5000 | ORAL_TABLET | Freq: Every day | ORAL | Status: DC

## 2012-05-13 ENCOUNTER — Encounter: Payer: Self-pay | Admitting: Family Medicine

## 2012-05-13 ENCOUNTER — Ambulatory Visit (INDEPENDENT_AMBULATORY_CARE_PROVIDER_SITE_OTHER): Payer: Medicare Other | Admitting: Family Medicine

## 2012-05-13 VITALS — BP 106/62 | HR 64 | Wt 207.0 lb

## 2012-05-13 DIAGNOSIS — E039 Hypothyroidism, unspecified: Secondary | ICD-10-CM

## 2012-05-13 DIAGNOSIS — E559 Vitamin D deficiency, unspecified: Secondary | ICD-10-CM

## 2012-05-13 DIAGNOSIS — R600 Localized edema: Secondary | ICD-10-CM

## 2012-05-13 DIAGNOSIS — I10 Essential (primary) hypertension: Secondary | ICD-10-CM

## 2012-05-13 DIAGNOSIS — R609 Edema, unspecified: Secondary | ICD-10-CM | POA: Diagnosis not present

## 2012-05-13 NOTE — Patient Instructions (Signed)
Stop the amlodipine

## 2012-05-13 NOTE — Progress Notes (Signed)
  Subjective:    Patient ID: [name], female    DOB: [birth year], 66 y.o.   MRN: [id number]  HPI Hypertension-she has been keeping a log of blood pressures at home. Her daughter has been checking it for her. She says they do swimming. Some days is really high and some days her blood pressures under 100. She says when it's not low she actually feels very sleepy and tired. Patient reports she does not sleep well in general.  Lower extremity edema-she says she also compression stockings over-the-counter. I last saw her she was very swollen and her d-dimer was elevated. We set her up for a scan and she was negative for blood clot which is fantastic. She did increase her Lasix for 2 days initially and says that that did help. She has not had to take additional doses since then.  Vitamin D deficiency-she was asking for refill on her prescription. She's been taking an over-the-counter supplement in the meantime.  Hypothyroidism-she wants and what her blood results were at her appointment a couple weeks ago.  Review of Systems     Objective:   Physical Exam  Constitutional: She is oriented to person, place, and time. She appears well-developed and well-nourished.  HENT:  Head: Normocephalic and atraumatic.  Cardiovascular: Normal rate, regular rhythm and normal heart sounds.   Pulmonary/Chest: Effort normal and breath sounds normal.  Musculoskeletal:       Trace edema in the right LE. No erythema. She is wearing her compression stockings today.   Neurological: She is alert and oriented to person, place, and time.  Skin: Skin is warm and dry.  Psychiatric: She has a normal mood and affect. Her behavior is normal.          Assessment & Plan:  Hypothyroid - Uncontrolled.  Cut tab in half one day a week and whole all other days. Recheck at f/u in 6 weeks.   LE edema-much improved. Continue to avoid salt and eat a low-salt diet as much as possible. Continue her compression stockings I  think this is helping greatly. And she can take an extra fluid pill if needed from time to time but is using it frequently mentions to let me know.  HTN - Low today.  Stop amlodipine. F/U in 6 weeks.  She will bring in her blood pressure log from home.  Vitamin D deficiency-we will recheck her level next time. She are to her supplement today and says she just wants to wait until her followup to recheck it. I met hoping that the over-the-counter supplement is enough instead of needing the prescription.

## 2012-05-24 ENCOUNTER — Other Ambulatory Visit: Payer: Self-pay | Admitting: Family Medicine

## 2012-05-27 ENCOUNTER — Ambulatory Visit: Payer: Medicare Other | Admitting: Family Medicine

## 2012-05-28 ENCOUNTER — Other Ambulatory Visit: Payer: Self-pay | Admitting: Family Medicine

## 2012-05-28 DIAGNOSIS — N35919 Unspecified urethral stricture, male, unspecified site: Secondary | ICD-10-CM | POA: Diagnosis not present

## 2012-05-28 DIAGNOSIS — R3129 Other microscopic hematuria: Secondary | ICD-10-CM | POA: Diagnosis not present

## 2012-05-28 DIAGNOSIS — N319 Neuromuscular dysfunction of bladder, unspecified: Secondary | ICD-10-CM | POA: Diagnosis not present

## 2012-06-18 ENCOUNTER — Other Ambulatory Visit: Payer: Self-pay | Admitting: Family Medicine

## 2012-06-21 ENCOUNTER — Other Ambulatory Visit: Payer: Self-pay

## 2012-06-21 MED ORDER — ACETAMINOPHEN-CODEINE #3 300-30 MG PO TABS: 1.5000 | ORAL_TABLET | Freq: Every day | ORAL | Status: DC

## 2012-06-24 ENCOUNTER — Ambulatory Visit: Payer: Medicare Other | Admitting: Family Medicine

## 2012-06-24 DIAGNOSIS — Z0289 Encounter for other administrative examinations: Secondary | ICD-10-CM

## 2012-06-26 ENCOUNTER — Other Ambulatory Visit: Payer: Self-pay | Admitting: Family Medicine

## 2012-07-15 ENCOUNTER — Ambulatory Visit: Payer: Medicare Other | Admitting: Physical Medicine & Rehabilitation

## 2012-08-09 ENCOUNTER — Other Ambulatory Visit: Payer: Self-pay | Admitting: Family Medicine

## 2012-08-12 ENCOUNTER — Ambulatory Visit (INDEPENDENT_AMBULATORY_CARE_PROVIDER_SITE_OTHER): Payer: Medicare Other | Admitting: Family Medicine

## 2012-08-12 ENCOUNTER — Encounter: Payer: Self-pay | Admitting: Family Medicine

## 2012-08-12 VITALS — BP 138/62 | HR 56 | Resp 18 | Wt 203.0 lb

## 2012-08-12 DIAGNOSIS — F17200 Nicotine dependence, unspecified, uncomplicated: Secondary | ICD-10-CM

## 2012-08-12 DIAGNOSIS — H6090 Unspecified otitis externa, unspecified ear: Secondary | ICD-10-CM

## 2012-08-12 DIAGNOSIS — E119 Type 2 diabetes mellitus without complications: Secondary | ICD-10-CM

## 2012-08-12 DIAGNOSIS — I10 Essential (primary) hypertension: Secondary | ICD-10-CM

## 2012-08-12 DIAGNOSIS — R197 Diarrhea, unspecified: Secondary | ICD-10-CM

## 2012-08-12 DIAGNOSIS — R911 Solitary pulmonary nodule: Secondary | ICD-10-CM

## 2012-08-12 DIAGNOSIS — H60399 Other infective otitis externa, unspecified ear: Secondary | ICD-10-CM | POA: Diagnosis not present

## 2012-08-12 MED ORDER — TRAZODONE HCL 150 MG PO TABS: 150.0000 mg | ORAL_TABLET | Freq: Every day | ORAL | Status: DC

## 2012-08-12 MED ORDER — MECLIZINE HCL 50 MG PO TABS: 25.0000 mg | ORAL_TABLET | Freq: Three times a day (TID) | ORAL | Status: DC | PRN

## 2012-08-12 MED ORDER — CIPROFLOXACIN-HYDROCORTISONE 0.2-1 % OT SUSP: 3.0000 [drp] | Freq: Two times a day (BID) | OTIC | Status: DC

## 2012-08-12 NOTE — Progress Notes (Signed)
Subjective:    Patient ID: [name], female    DOB: 26-Aug-1945, 67 y.o.   MRN: [id number]  HPI LE swelling is actually better. When seen in sept her d-dimer was elevated and had neg [place].    HTN-  Pt denies chest pain, SOB, or heart palpitations.  Taking meds as directed w/o problems.  Denies medication side effects.   Last week was so dizzy she couldn't move her eyes or would feel really dizzy like the room was spinning. Says felt so bad was hard to get out of bed. Was also having severe diarrhea during the same time. Stool was watery and had a lot of pressure. Then the next day felt better but then then 3rd day felt worse again.  Says overall she is better this week.  She has only had vertigo this bad once before and given rx for antivert. She says her left ear drianed and she felt better.  She does use some cream for eczema in that ear as well. No fever.    DM- Fasting sugars normall well controllled. But has been eatin more Christmas cookies lately. No regular episodes. No hypoglycemic events.  Review of Systems     BP 138/62  Pulse 56  Resp 18  Wt 203 lb (92.08 kg)  SpO2 96%    No Known Allergies  Past Medical History  Diagnosis Date  . Hypertension   . Hyperlipidemia   . PUD (peptic ulcer disease)   . Charcot-Marie-Tooth disease   . Arthritis   . Neurogenic bladder   . Diabetic peripheral neuropathy   . Osteopenia   . Post-menopausal   . Insomnia   . COPD (chronic obstructive pulmonary disease)   . DDD (degenerative disc disease)     Lumbar and lumbosacral  . Tobacco dependence   . Thyroid disease     hypo  . Gastric ulcer     w/o hemorrhage  . Diabetes mellitus     type 2  . Depression   . Obesity   . Unspecified hereditary and idiopathic peripheral neuropathy   . Lumbosacral root lesions, not elsewhere classified   . Other symptoms referable to back   . Thoracic spondylosis without myelopathy   . Facet syndrome, lumbar   . Spinal stenosis, lumbar  region, without neurogenic claudication     Past Surgical History  Procedure Date  . Cholecystectomy   . Replacement total knee   . Fluoroscopic l5 dorsal medial branch bloc   . Bilateral l3 medial branch block   . Fibroid tumor removal   . Renal artery stent 11/28/2010    left  . Ankle reconstruction     left ankle, plate and pins     History   Social History  . Marital Status: Widowed    Spouse Name: N/A    Number of Children: N/A  . Years of Education: N/A   Occupational History  . Not on file.   Social History Main Topics  . Smoking status: Current Every Day Smoker -- 1.0 packs/day for 49 years    Types: Cigarettes  . Smokeless tobacco: Never Used     Comment: pt states she is going to try the E-cig  . Alcohol Use: No  . Drug Use: No  . Sexually Active: Not on file   Other Topics Concern  . Not on file   Social History Narrative  . No narrative on file    Family History  Problem Relation Age of Onset  .  Stroke Mother   . Heart disease Father 37    heart attack  . Hypertension Father   . Cancer Father     lung  . Diabetes Paternal Aunt     insulin dependent    Outpatient Encounter Prescriptions as of 08/12/2012  Medication Sig Dispense Refill  . acetaminophen-codeine (TYLENOL #3) 300-30 MG per tablet Take 1.5 tablets by mouth daily.  45 tablet  0  . alendronate (FOSAMAX) 70 MG tablet Take 1 tablet (70 mg total) by mouth every 7 (seven) days. Take with a full glass of water on an empty stomach.  4 tablet  11  . amLODipine (NORVASC) 5 MG tablet TAKE 1/2 TABLET ONCE DAILY  15 tablet  3  . aspirin EC 81 MG tablet Take 81 mg by mouth daily.      . ciprofloxacin-hydrocortisone (CIPRO HC OTIC) otic suspension Place 3 drops into the left ear 2 (two) times daily.  10 mL  0  . clopidogrel (PLAVIX) 75 MG tablet TAKE 1 TABLET DAILY  30 tablet  3  . furosemide (LASIX) 40 MG tablet TAKE (1) TABLET DAILY IN THE MORNING.  30 tablet  1  . gabapentin (NEURONTIN) 400 MG  capsule Take 1 capsule (400 mg total) by mouth 3 (three) times daily.  90 capsule  5  . glucose blood test strip 1 each as needed. Use as instructed       . Lancets MISC by Does not apply route.        Marland Kitchen LANTUS SOLOSTAR 100 UNIT/ML injection Inject 48 Units into the skin daily.  15 mL  3  . levothyroxine (SYNTHROID, LEVOTHROID) 150 MCG tablet TAKE 1 TABLET DAILY ON EMPTY STOMACH  30 tablet  3  . LOTENSIN 20 MG tablet TAKE 1 TABLET DAILY.  30 tablet  3  . meclizine (ANTIVERT) 50 MG tablet Take 0.5-1 tablets (25-50 mg total) by mouth 3 (three) times daily as needed for dizziness.  60 tablet  0  . metFORMIN (GLUCOPHAGE-XR) 500 MG 24 hr tablet TAKE 2 TABLETS DAILY WITH EVENING MEAL  60 tablet  3  . metoprolol (LOPRESSOR) 100 MG tablet TAKE 1 TABLET TWICE DAILY  60 tablet  3  . nitroGLYCERIN (NITROSTAT) 0.4 MG SL tablet Place 1 tablet (0.4 mg total) under the tongue every 5 (five) minutes as needed.  10 tablet  1  . simvastatin (ZOCOR) 20 MG tablet TAKE 1 AT BEDTIME  90 tablet  2  . traMADol (ULTRAM) 50 MG tablet Take 2 tablets (100 mg total) by mouth every 8 (eight) hours as needed for pain. Take 2 tabs 3 times/ day  180 tablet  5  . traZODone (DESYREL) 150 MG tablet Take 1 tablet (150 mg total) by mouth at bedtime.  30 tablet  1  . VENTOLIN HFA 108 (90 BASE) MCG/ACT inhaler Inhale 2 puffs into the lungs every 6 (six) hours as needed for wheezi ng.  18 each  3  . [DISCONTINUED] traZODone (DESYREL) 100 MG tablet TAKE 1 TABLET AT BEDTIME.  30 tablet  1       Objective:   Physical Exam  Constitutional: She is oriented to person, place, and time. She appears well-developed and well-nourished.  HENT:  Head: Normocephalic and atraumatic.  Right Ear: External ear normal.  Left Ear: External ear normal.  Nose: Nose normal.  Mouth/Throat: Oropharynx is clear and moist.       Right TM and canals are clear. Left canal is full of white  debris consistant with OM. Dry peeling skin in the canal and over  the outer ear.   Eyes: Conjunctivae normal and EOM are normal. Pupils are equal, round, and reactive to light.  Neck: Neck supple. No thyromegaly present.  Cardiovascular: Normal rate, regular rhythm and normal heart sounds.   Pulmonary/Chest: Effort normal and breath sounds normal. She has no wheezes.  Musculoskeletal:       Left leg with more swelling compared to the right.   Lymphadenopathy:    She has no cervical adenopathy.  Neurological: She is alert and oriented to person, place, and time.  Skin: Skin is warm and dry.  Psychiatric: She has a normal mood and affect.          Assessment & Plan:  BPV- Given H.O on exercise.  She is better this week. Call if not better in 2 weeks.   DM - Well controlled but A1C is up just slightly. Get back on diet and avoid concentrated sweets.  F/u in 3 months.   HTN - Well controlled. Continue current regimen.    Diarrhea - Nowr resolved. Was likely viral.  Call if recurs.   LE edema is improved.  Continue ot monitor.    OE- will treat with Cipro HC otic. Cal if not betetr. Will help tx the infection and the steorid will help with the eczema in the ear. Followup in 10 days for ear recheck. Call if develops any pain, fever, or hearing loss in that ear.

## 2012-08-12 NOTE — Patient Instructions (Addendum)
Benign Positional Vertigo Vertigo means you feel like you or your surroundings are moving when they are not. Benign positional vertigo is the most common form of vertigo. Benign means that the cause of your condition is not serious. Benign positional vertigo is more common in older adults. CAUSES   Benign positional vertigo is the result of an upset in the labyrinth system. This is an area in the middle ear that helps control your balance. This may be caused by a viral infection, head injury, or repetitive motion. However, often no specific cause is found. SYMPTOMS   Symptoms of benign positional vertigo occur when you move your head or eyes in different directions. Some of the symptoms may include:  Loss of balance and falls.   Vomiting.   Blurred vision.   Dizziness.   Nausea.   Involuntary eye movements (nystagmus).  DIAGNOSIS   Benign positional vertigo is usually diagnosed by physical exam. If the specific cause of your benign positional vertigo is unknown, your caregiver may perform imaging tests, such as magnetic resonance imaging (MRI) or computed tomography (CT). TREATMENT   Your caregiver may recommend movements or procedures to correct the benign positional vertigo. Medicines such as meclizine, benzodiazepines, and medicines for nausea may be used to treat your symptoms. In rare cases, if your symptoms are caused by certain conditions that affect the inner ear, you may need surgery. HOME CARE INSTRUCTIONS    Follow your caregiver's instructions.   Move slowly. Do not make sudden body or head movements.   Avoid driving.   Avoid operating heavy machinery.   Avoid performing any tasks that would be dangerous to you or others during a vertigo episode.   Drink enough fluids to keep your urine clear or pale yellow.  SEEK IMMEDIATE MEDICAL CARE IF:    You develop problems with walking, weakness, numbness, or using your arms, hands, or legs.   You have difficulty speaking.     You develop severe headaches.   Your nausea or vomiting continues or gets worse.   You develop visual changes.   Your family or friends notice any behavioral changes.   Your condition gets worse.   You have a fever.   You develop a stiff neck or sensitivity to light.  MAKE SURE YOU:    Understand these instructions.   Will watch your condition.   Will get help right away if you are not doing well or get worse.  Document Released: 05/01/2006 Document Revised: 10/16/2011 Document Reviewed: 04/13/2011 [facility] Patient Information 2013 [place], [place].   Otitis Externa Otitis externa is a bacterial or fungal infection of the outer ear canal. This is the area from the eardrum to the outside of the ear. Otitis externa is sometimes called "swimmer's ear." CAUSES   Possible causes of infection include:  Swimming in dirty water.   Moisture remaining in the ear after swimming or bathing.   Mild injury (trauma) to the ear.   Objects stuck in the ear (foreign body).   Cuts or scrapes (abrasions) on the outside of the ear.  SYMPTOMS   The first symptom of infection is often itching in the ear canal. Later signs and symptoms may include swelling and redness of the ear canal, ear pain, and yellowish-white fluid (pus) coming from the ear. The ear pain may be worse when pulling on the earlobe. DIAGNOSIS   Your caregiver will perform a physical exam. A sample of fluid may be taken from the ear and examined for bacteria  or fungi. TREATMENT   Antibiotic ear drops are often given for 10 to 14 days. Treatment may also include pain medicine or corticosteroids to reduce itching and swelling. PREVENTION    Keep your ear dry. Use the corner of a towel to absorb water out of the ear canal after swimming or bathing.   Avoid scratching or putting objects inside your ear. This can damage the ear canal or remove the protective wax that lines the canal. This makes it easier for bacteria and fungi  to grow.   Avoid swimming in lakes, polluted water, or poorly chlorinated pools.   You may use ear drops made of rubbing alcohol and vinegar after swimming. Combine equal parts of white vinegar and alcohol in a bottle. Put 3 or 4 drops into each ear after swimming.  HOME CARE INSTRUCTIONS    Apply antibiotic ear drops to the ear canal as prescribed by your caregiver.   Only take over-the-counter or prescription medicines for pain, discomfort, or fever as directed by your caregiver.   If you have diabetes, follow any additional treatment instructions from your caregiver.   Keep all follow-up appointments as directed by your caregiver.  SEEK MEDICAL CARE IF:    You have a fever.   Your ear is still red, swollen, painful, or draining pus after 3 days.   Your redness, swelling, or pain gets worse.   You have a severe headache.   You have redness, swelling, pain, or tenderness in the area behind your ear.  MAKE SURE YOU:    Understand these instructions.   Will watch your condition.   Will get help right away if you are not doing well or get worse.  Document Released: 07/24/2005 Document Revised: 10/16/2011 Document Reviewed: 08/10/2011 [facility] Patient Information 2013 [place], [place].

## 2012-08-22 ENCOUNTER — Encounter: Payer: Self-pay | Admitting: Family Medicine

## 2012-08-22 ENCOUNTER — Ambulatory Visit (INDEPENDENT_AMBULATORY_CARE_PROVIDER_SITE_OTHER): Payer: Medicare Other | Admitting: Family Medicine

## 2012-08-22 VITALS — BP 106/62 | HR 66 | Temp 98.1°F | Resp 18 | Wt 205.0 lb

## 2012-08-22 DIAGNOSIS — F17200 Nicotine dependence, unspecified, uncomplicated: Secondary | ICD-10-CM

## 2012-08-22 DIAGNOSIS — H60399 Other infective otitis externa, unspecified ear: Secondary | ICD-10-CM

## 2012-08-22 DIAGNOSIS — R911 Solitary pulmonary nodule: Secondary | ICD-10-CM | POA: Diagnosis not present

## 2012-08-22 DIAGNOSIS — H6090 Unspecified otitis externa, unspecified ear: Secondary | ICD-10-CM

## 2012-08-22 NOTE — Progress Notes (Signed)
  Subjective:    Patient ID: [name], female    DOB: Jul 05, 1946, 67 y.o.   MRN: [id number]  HPI F/u Left ear OE. Unfortunately the Cipro otic was not covered under her insurance. Was going to cost Mr. $100 so we called in a different medication. She says she's doing about 90% better. Her pain is significantly improved. No significant drainage from the ear. She wants to know she should continue the drops or not. She still has some medication left in the bottle.   She is due for repeat CT chest to f/u lung nodule.   Review of Systems     Objective:   Physical Exam  Constitutional: She is oriented to person, place, and time. She appears well-developed and well-nourished.  HENT:  Head: Normocephalic and atraumatic.  Right Ear: External ear normal.  Left Ear: External ear normal.       Left TM and canal is clear of infection. Some wax removed.   Neurological: She is alert and oriented to person, place, and time.  Skin: Skin is warm and dry.  Psychiatric: She has a normal mood and affect. Her behavior is normal.          Assessment & Plan:  Left otitis externa-year looks fantastic today. Didn't manually removed a small amount of wax. Continue drops for 2 more days and then she should be able to stop.  She's also due for repeat chest CT for a 2 mm left upper lobe lung nodule seen on [facility] of [facility]. It has been a year and she is a smoker, is high risk for lung cancer so we will followup this nodule. She is continuing to work on smoking cessation. Yesterday she only smokes 3 cigarettes which is a big improvement.

## 2012-08-29 ENCOUNTER — Other Ambulatory Visit: Payer: Self-pay | Admitting: Family Medicine

## 2012-09-02 ENCOUNTER — Other Ambulatory Visit: Payer: Medicare Other

## 2012-09-04 ENCOUNTER — Ambulatory Visit (HOSPITAL_BASED_OUTPATIENT_CLINIC_OR_DEPARTMENT_OTHER): Payer: Medicare Other

## 2012-09-05 ENCOUNTER — Other Ambulatory Visit: Payer: Self-pay | Admitting: Family Medicine

## 2012-09-12 DIAGNOSIS — R911 Solitary pulmonary nodule: Secondary | ICD-10-CM | POA: Diagnosis not present

## 2012-09-12 DIAGNOSIS — R918 Other nonspecific abnormal finding of lung field: Secondary | ICD-10-CM | POA: Diagnosis not present

## 2012-09-13 ENCOUNTER — Telehealth: Payer: Self-pay | Admitting: Family Medicine

## 2012-09-13 ENCOUNTER — Other Ambulatory Visit: Payer: Self-pay | Admitting: *Deleted

## 2012-09-13 MED ORDER — TRAMADOL HCL 50 MG PO TABS: 100.0000 mg | ORAL_TABLET | Freq: Three times a day (TID) | ORAL | Status: DC | PRN

## 2012-09-13 NOTE — Telephone Encounter (Signed)
Pt notified of results and states she is trying again to quit smoking. KG LPN

## 2012-09-13 NOTE — Telephone Encounter (Signed)
Call patient: Chest CT shows that the lung nodule is stable. Has not increased in size or changed. This is fantastic news. No further followup needed.still rec toba cessation.

## 2012-09-27 ENCOUNTER — Other Ambulatory Visit: Payer: Self-pay

## 2012-09-27 MED ORDER — GABAPENTIN 400 MG PO CAPS: 400.0000 mg | ORAL_CAPSULE | Freq: Three times a day (TID) | ORAL | Status: DC

## 2012-09-30 ENCOUNTER — Telehealth: Payer: Self-pay

## 2012-09-30 NOTE — Telephone Encounter (Signed)
[name] left message stating her back hurts and she needs to know if it is ok to take Aleve.

## 2012-09-30 NOTE — Telephone Encounter (Signed)
  Tylenol would be a better choice. Can she use tylenol arthritis.

## 2012-10-01 NOTE — Telephone Encounter (Signed)
Patinet notified of MD instructions. Barry Dienes, LPN

## 2012-10-15 ENCOUNTER — Other Ambulatory Visit: Payer: Self-pay | Admitting: Family Medicine

## 2012-10-23 ENCOUNTER — Other Ambulatory Visit: Payer: Self-pay | Admitting: Family Medicine

## 2012-10-25 ENCOUNTER — Other Ambulatory Visit: Payer: Self-pay | Admitting: Family Medicine

## 2012-11-03 ENCOUNTER — Other Ambulatory Visit: Payer: Self-pay | Admitting: Family Medicine

## 2012-11-11 ENCOUNTER — Ambulatory Visit (INDEPENDENT_AMBULATORY_CARE_PROVIDER_SITE_OTHER): Payer: Medicare Other | Admitting: Family Medicine

## 2012-11-11 ENCOUNTER — Encounter: Payer: Self-pay | Admitting: Family Medicine

## 2012-11-11 VITALS — BP 111/65 | HR 59 | Wt 202.0 lb

## 2012-11-11 DIAGNOSIS — R197 Diarrhea, unspecified: Secondary | ICD-10-CM

## 2012-11-11 DIAGNOSIS — H8110 Benign paroxysmal vertigo, unspecified ear: Secondary | ICD-10-CM

## 2012-11-11 DIAGNOSIS — R1032 Left lower quadrant pain: Secondary | ICD-10-CM

## 2012-11-11 DIAGNOSIS — E119 Type 2 diabetes mellitus without complications: Secondary | ICD-10-CM | POA: Diagnosis not present

## 2012-11-11 DIAGNOSIS — I10 Essential (primary) hypertension: Secondary | ICD-10-CM

## 2012-11-11 LAB — CBC WITH DIFFERENTIAL/PLATELET
Eosinophils Absolute: 0.1 10*3/uL (ref 0.0–0.7)
Eosinophils Relative: 2 % (ref 0–5)
HCT: 46.7 % — ABNORMAL HIGH (ref 36.0–46.0)
Hemoglobin: 15.3 g/dL — ABNORMAL HIGH (ref 12.0–15.0)
Lymphocytes Relative: 26 % (ref 12–46)
Lymphs Abs: 2 10*3/uL (ref 0.7–4.0)
MCH: 30.1 pg (ref 26.0–34.0)
MCV: 91.7 fL (ref 78.0–100.0)
Monocytes Absolute: 0.6 10*3/uL (ref 0.1–1.0)
Monocytes Relative: 8 % (ref 3–12)
RBC: 5.09 MIL/uL (ref 3.87–5.11)
WBC: 7.5 10*3/uL (ref 4.0–10.5)

## 2012-11-11 NOTE — Progress Notes (Signed)
  Subjective:    Patient ID: [name], female    DOB: 1946-04-21, 67 y.o.   MRN: [id number]  HPI She had diarrhea fot the last week. It was watery. Had chills.  Had abdominal cramping but no specific abdominal pain.  No vomiting or nausea.  Stools are not watery today, but still running and soft.  No blood in the urine or stool. Not sure if she actually had fever or not. No worsening or alleviating factors. She's been trying to stay well-hydrated. She hasn't been eating much solid food.  DM - No highs or lows.  No hypoglycemic events.  No wounds that aren't healing well.    Benign positional vertigo-Still feeling dizzy.  Has been dong her home exercises and using Antivert.  It helps some but not as much as it did when she first started them. The dizziness is not quite as intense. She denies any near syncopal events. No recent swelling. No chest pain or short of breath.   Review of Systems     Objective:   Physical Exam  Constitutional: She is oriented to person, place, and time. She appears well-developed and well-nourished.  HENT:  Head: Normocephalic and atraumatic.  Cardiovascular: Normal rate, regular rhythm and normal heart sounds.   No carotid bruits  Pulmonary/Chest: Effort normal and breath sounds normal.  Abdominal: Soft. Bowel sounds are normal. She exhibits no distension and no mass. There is tenderness. There is no rebound and no guarding.  She is diffusely tender but very tender in the left lower quadrant.  Neurological: She is alert and oriented to person, place, and time.  Skin: Skin is warm and dry.  Psychiatric: She has a normal mood and affect. Her behavior is normal.          Assessment & Plan:  Diarrhea - likely viral. Will check a CBC with differential today. Consider diverticulitis as well especially because she's very tender in the left lower quadrant. If her white count is significantly elevated then consider CT of the abdomen for further  evaluation.  DM- Well controlled. Continue current regimen. Followup in 3 months. She declined to have the foot exam and urine microalbumin performed today. We will try to get these at the next office visit. She is on ACE inhibitor, statin and aspirin daily. Lab Results  Component Value Date   HGBA1C 6.4 11/11/2012   Vertigo - Will refer to vestubular rehab, since she's only getting minimal relief with the exercises and the Antivert.   Hypertension-well-controlled today. Continue current regimen.

## 2012-11-15 DIAGNOSIS — H8110 Benign paroxysmal vertigo, unspecified ear: Secondary | ICD-10-CM | POA: Diagnosis not present

## 2012-12-12 ENCOUNTER — Other Ambulatory Visit: Payer: Self-pay | Admitting: Family Medicine

## 2012-12-19 ENCOUNTER — Other Ambulatory Visit: Payer: Self-pay | Admitting: Family Medicine

## 2012-12-25 ENCOUNTER — Encounter: Payer: Self-pay | Admitting: Family Medicine

## 2012-12-25 ENCOUNTER — Ambulatory Visit (INDEPENDENT_AMBULATORY_CARE_PROVIDER_SITE_OTHER): Payer: Medicare Other | Admitting: Family Medicine

## 2012-12-25 VITALS — BP 112/67 | HR 49 | Ht 67.0 in | Wt 205.0 lb

## 2012-12-25 DIAGNOSIS — R609 Edema, unspecified: Secondary | ICD-10-CM

## 2012-12-25 DIAGNOSIS — R600 Localized edema: Secondary | ICD-10-CM

## 2012-12-25 DIAGNOSIS — Z1211 Encounter for screening for malignant neoplasm of colon: Secondary | ICD-10-CM

## 2012-12-25 LAB — COMPLETE METABOLIC PANEL WITH GFR
ALT: 18 U/L (ref 0–35)
BUN: 12 mg/dL (ref 6–23)
CO2: 31 mEq/L (ref 19–32)
Calcium: 9.6 mg/dL (ref 8.4–10.5)
Chloride: 98 mEq/L (ref 96–112)
Creat: 1.11 mg/dL — ABNORMAL HIGH (ref 0.50–1.10)
GFR, Est African American: 59 mL/min — ABNORMAL LOW
GFR, Est Non African American: 51 mL/min — ABNORMAL LOW
Glucose, Bld: 97 mg/dL (ref 70–99)
Total Bilirubin: 0.9 mg/dL (ref 0.3–1.2)

## 2012-12-25 MED ORDER — NITROGLYCERIN 0.4 MG SL SUBL: 0.4000 mg | SUBLINGUAL_TABLET | SUBLINGUAL | Status: DC | PRN

## 2012-12-25 NOTE — Progress Notes (Signed)
Subjective:    Patient ID: [name], female    DOB: 1946/04/09, 67 y.o.   MRN: [id number]  HPI Noticed swelling below her left knee 4 days ago. Says wears a leg brace and noticed it when took it off. Saw an indention.  No changes to knee brace. Has had a knee replacement in that knee years ago.   Says took a couple of days to go down but it is better now.  Says usually wears TED hose but doesn't have thme on today.  Same leg that has charcot marie tooth.  No pain or redness.  She has not worn her brace since them.  No diet or med changes. No added salt.  Does have some mild bilat swelling that gets worse as the day goes on but better in AM.  Had noticed some sharp pains in her leg last week but it spontaneously resolved.  No fever or URI sxs.  No trauma.  Her back pain has been moving to her hip. No changes in activity level.No CP or SOB.  No recent NSAIDs. No UTI sxs. She does have some chronic low back and neck pain but has not had any recent changes or exacerbation of her symptoms.   Review of Systems BP 112/67  Pulse 49  Ht 5\' 7"  (1.702 m)  Wt 205 lb (92.987 kg)  BMI 32.1 kg/m2    No Known Allergies  Past Medical History  Diagnosis Date  . Hypertension   . Hyperlipidemia   . PUD (peptic ulcer disease)   . Charcot-Marie-Tooth disease   . Arthritis   . Neurogenic bladder   . Diabetic peripheral neuropathy   . Osteopenia   . Post-menopausal   . Insomnia   . COPD (chronic obstructive pulmonary disease)   . DDD (degenerative disc disease)     Lumbar and lumbosacral  . Tobacco dependence   . Thyroid disease     hypo  . Gastric ulcer     w/o hemorrhage  . Diabetes mellitus     type 2  . Depression   . Obesity   . Unspecified hereditary and idiopathic peripheral neuropathy   . Lumbosacral root lesions, not elsewhere classified   . Other symptoms referable to back   . Thoracic spondylosis without myelopathy   . Facet syndrome, lumbar   . Spinal stenosis, lumbar region,  without neurogenic claudication     Past Surgical History  Procedure Laterality Date  . Cholecystectomy    . Replacement total knee    . Fluoroscopic l5 dorsal medial branch bloc    . Bilateral l3 medial branch block    . Fibroid tumor removal    . Renal artery stent  11/28/2010    left  . Ankle reconstruction      left ankle, plate and pins     History   Social History  . Marital Status: Widowed    Spouse Name: N/A    Number of Children: N/A  . Years of Education: N/A   Occupational History  . Not on file.   Social History Main Topics  . Smoking status: Current Every Day Smoker -- 1.00 packs/day for 49 years    Types: Cigarettes  . Smokeless tobacco: Never Used     Comment: pt states she is going to try the E-cig  . Alcohol Use: No  . Drug Use: No  . Sexually Active: Not on file   Other Topics Concern  . Not on file  Social History Narrative  . No narrative on file    Family History  Problem Relation Age of Onset  . Stroke Mother   . Heart disease Father 53    heart attack  . Hypertension Father   . Cancer Father     lung  . Diabetes Paternal Aunt     insulin dependent    Outpatient Encounter Prescriptions as of 12/25/2012  Medication Sig Dispense Refill  . acetaminophen-codeine (TYLENOL #3) 300-30 MG per tablet Take 1.5 tablets by mouth daily.  45 tablet  0  . alendronate (FOSAMAX) 70 MG tablet Take 1 tablet (70 mg total) by mouth every 7 (seven) days. Take with a full glass of water on an empty stomach.  4 tablet  11  . amLODipine (NORVASC) 5 MG tablet TAKE 1/2 TABLET ONCE DAILY  15 tablet  3  . aspirin EC 81 MG tablet Take 81 mg by mouth daily.      . benazepril (LOTENSIN) 20 MG tablet TAKE 1 TABLET DAILY.  30 tablet  4  . clopidogrel (PLAVIX) 75 MG tablet TAKE 1 TABLET DAILY  30 tablet  3  . furosemide (LASIX) 40 MG tablet TAKE (1) TABLET DAILY IN THE MORNING.  30 tablet  3  . gabapentin (NEURONTIN) 400 MG capsule Take 1 capsule (400 mg total) by  mouth 3 (three) times daily.  90 capsule  5  . glucose blood test strip 1 each as needed. Use as instructed       . Lancets MISC by Does not apply route.        Marland Kitchen LANTUS SOLOSTAR 100 UNIT/ML injection Inject 48 Units into the skin daily.  15 mL  1  . levothyroxine (SYNTHROID, LEVOTHROID) 150 MCG tablet TAKE 1 TABLET DAILY ON EMPTY STOMACH  30 tablet  3  . meclizine (ANTIVERT) 50 MG tablet Take 0.5-1 tablets (25-50 mg total) by mouth 3 (three) times daily as needed for dizziness.  60 tablet  0  . metFORMIN (GLUCOPHAGE-XR) 500 MG 24 hr tablet TAKE 2 TABLETS DAILY WITH EVENING MEAL  60 tablet  3  . metoprolol (LOPRESSOR) 100 MG tablet TAKE 1 TABLET TWICE DAILY  60 tablet  3  . nitroGLYCERIN (NITROSTAT) 0.4 MG SL tablet Place 1 tablet (0.4 mg total) under the tongue every 5 (five) minutes as needed.  10 tablet  1  . simvastatin (ZOCOR) 20 MG tablet TAKE 1 AT BEDTIME  90 tablet  0  . traMADol (ULTRAM) 50 MG tablet Take 2 tablets (100 mg total) by mouth every 8 (eight) hours as needed for pain. Take 2 tabs 3 times/ day  180 tablet  2  . traZODone (DESYREL) 150 MG tablet Take 1 tablet (150 mg total) by mouth at bedtime.  30 tablet  2  . VENTOLIN HFA 108 (90 BASE) MCG/ACT inhaler Inhale 2 puffs into the lungs every 6 (six) hours as needed for wheezi ng.  18 each  3  . [DISCONTINUED] ciprofloxacin-hydrocortisone (CIPRO HC OTIC) otic suspension Place 3 drops into the left ear 2 (two) times daily.  10 mL  0  . [DISCONTINUED] nitroGLYCERIN (NITROSTAT) 0.4 MG SL tablet Place 1 tablet (0.4 mg total) under the tongue every 5 (five) minutes as needed.  10 tablet  1   No facility-administered encounter medications on file as of 12/25/2012.          Objective:   Physical Exam  Constitutional: She is oriented to person, place, and time. She appears well-developed and  well-nourished.  HENT:  Head: Normocephalic and atraumatic.  Cardiovascular: Normal rate, regular rhythm and normal heart sounds.    Pulmonary/Chest: Effort normal and breath sounds normal.  Musculoskeletal:  Left knee with well-healed surgical scar from her knee replacement. Normal extension and flexion without any significant pain or tenderness. She's nontender over the left ankle as well. She does have some 1+ edema from the knee down along the tibia. She has some 1+ edema over the top of the foot as well. I did have difficulty filling her dorsal pedal pulse. She has some hyperpigmentation changes along the left medial lower leg. She has has trace edema on the right lower leg.    Neurological: She is alert and oriented to person, place, and time.  Skin: Skin is warm and dry.  Psychiatric: She has a normal mood and affect. Her behavior is normal.          Assessment & Plan:  Lower extremity swelling-it is more prominent on the left. I'm not sure exactly what may have caused a recent increase in her swelling. It really sounds like she has not had any major diet changes. She did not get into any excessive salt she has not been taking any NSAIDs. None of her medications have changed recently. We will check her renal function and her electrolytes. She does have a history of renal insufficiency. We will also check a TSH to make sure that her thyroid is still well controlled. The swelling is actually improved over the last couple days so I think things like DVT are much less likely. She's also not having any significant pain with the swelling. No chest pain or short of breath and lungs are clear today. She is she is on amlodipine which can cause some lower extremity swelling but we have not recently adjust her dose and typically that swelling is bilateral. I think Mae Thurner syndrome is low on the differential.

## 2012-12-27 ENCOUNTER — Encounter: Payer: Self-pay | Admitting: Family Medicine

## 2012-12-28 ENCOUNTER — Other Ambulatory Visit: Payer: Self-pay | Admitting: Family Medicine

## 2013-01-03 ENCOUNTER — Telehealth: Payer: Self-pay

## 2013-01-03 NOTE — Telephone Encounter (Signed)
She is concerned about stopping the day before because she doesn't take it at night but in the afternoon.   She needs to decrease her DM medications due to her colonoscopy.  I advised her to decrease lantus to 20 units before clean out and take one metformin. Can hold lantus night before per Dr Linford Arnold.

## 2013-01-10 DIAGNOSIS — D128 Benign neoplasm of rectum: Secondary | ICD-10-CM | POA: Diagnosis not present

## 2013-01-10 DIAGNOSIS — Z1211 Encounter for screening for malignant neoplasm of colon: Secondary | ICD-10-CM | POA: Diagnosis not present

## 2013-01-10 DIAGNOSIS — K620 Anal polyp: Secondary | ICD-10-CM | POA: Diagnosis not present

## 2013-01-10 DIAGNOSIS — D129 Benign neoplasm of anus and anal canal: Secondary | ICD-10-CM | POA: Diagnosis not present

## 2013-01-10 DIAGNOSIS — D126 Benign neoplasm of colon, unspecified: Secondary | ICD-10-CM | POA: Diagnosis not present

## 2013-01-10 DIAGNOSIS — K621 Rectal polyp: Secondary | ICD-10-CM | POA: Diagnosis not present

## 2013-01-16 ENCOUNTER — Encounter: Payer: Self-pay | Admitting: *Deleted

## 2013-01-20 ENCOUNTER — Other Ambulatory Visit: Payer: Self-pay | Admitting: Family Medicine

## 2013-01-22 ENCOUNTER — Other Ambulatory Visit: Payer: Self-pay | Admitting: Family Medicine

## 2013-01-22 ENCOUNTER — Encounter: Payer: Self-pay | Admitting: Family Medicine

## 2013-01-28 ENCOUNTER — Telehealth: Payer: Self-pay

## 2013-01-28 NOTE — Telephone Encounter (Signed)
Patient says she cannot come to [place] any longer and would like a referral to pain management in [place].  Left message for patient giving her the phone number for [facility].  Also advised she would need to be referred from her PCP.

## 2013-01-31 ENCOUNTER — Telehealth: Payer: Self-pay | Admitting: *Deleted

## 2013-01-31 DIAGNOSIS — M47812 Spondylosis without myelopathy or radiculopathy, cervical region: Secondary | ICD-10-CM

## 2013-01-31 DIAGNOSIS — G600 Hereditary motor and sensory neuropathy: Secondary | ICD-10-CM

## 2013-01-31 DIAGNOSIS — M542 Cervicalgia: Secondary | ICD-10-CM

## 2013-01-31 DIAGNOSIS — M5137 Other intervertebral disc degeneration, lumbosacral region: Secondary | ICD-10-CM

## 2013-01-31 DIAGNOSIS — G609 Hereditary and idiopathic neuropathy, unspecified: Secondary | ICD-10-CM

## 2013-01-31 NOTE — Telephone Encounter (Signed)
Pt calls and states that she can not get back and forth to [facility] anymore to see her pain management MD and she called them and they gave her a name of [facility] in [place].  She is calling crying in a lot of pain and would like to be referred to [facility]- states she can not take the pain any longer

## 2013-01-31 NOTE — Telephone Encounter (Signed)
Pt notified rerferral placed. Barry Dienes, LPN

## 2013-01-31 NOTE — Telephone Encounter (Signed)
Will place a referral to [facility].

## 2013-02-11 ENCOUNTER — Ambulatory Visit (INDEPENDENT_AMBULATORY_CARE_PROVIDER_SITE_OTHER): Payer: Medicare Other | Admitting: Family Medicine

## 2013-02-11 ENCOUNTER — Encounter: Payer: Self-pay | Admitting: Family Medicine

## 2013-02-11 VITALS — BP 122/82 | HR 70 | Wt 199.0 lb

## 2013-02-11 DIAGNOSIS — J449 Chronic obstructive pulmonary disease, unspecified: Secondary | ICD-10-CM | POA: Diagnosis not present

## 2013-02-11 DIAGNOSIS — J019 Acute sinusitis, unspecified: Secondary | ICD-10-CM | POA: Diagnosis not present

## 2013-02-11 DIAGNOSIS — J4489 Other specified chronic obstructive pulmonary disease: Secondary | ICD-10-CM

## 2013-02-11 DIAGNOSIS — M549 Dorsalgia, unspecified: Secondary | ICD-10-CM

## 2013-02-11 DIAGNOSIS — G8929 Other chronic pain: Secondary | ICD-10-CM

## 2013-02-11 DIAGNOSIS — E119 Type 2 diabetes mellitus without complications: Secondary | ICD-10-CM | POA: Diagnosis not present

## 2013-02-11 LAB — POCT GLYCOSYLATED HEMOGLOBIN (HGB A1C): Hemoglobin A1C: 6.2

## 2013-02-11 MED ORDER — AMOXICILLIN-POT CLAVULANATE 875-125 MG PO TABS: 1.0000 | ORAL_TABLET | Freq: Two times a day (BID) | ORAL | Status: DC

## 2013-02-11 MED ORDER — HYDROCODONE-HOMATROPINE 5-1.5 MG/5ML PO SYRP: 5.0000 mL | ORAL_SOLUTION | Freq: Every evening | ORAL | Status: DC | PRN

## 2013-02-11 MED ORDER — HYDROCODONE-ACETAMINOPHEN 5-325 MG PO TABS: 1.0000 | ORAL_TABLET | Freq: Three times a day (TID) | ORAL | Status: DC | PRN

## 2013-02-11 NOTE — Progress Notes (Signed)
  Subjective:    Patient ID: [name], female    DOB: [birth year], 67 y.o.   MRN: [id number]  HPI  DM- no hypoglycemic events recently. Though has had a 60 since I last swa her.  No wounds that aren"t healing well. No lows in th middle of the night  Has had a few episodes of feeling like has the flu with weakness, aching, chillls.  Temp was 98.   But then goes away on its own after a few days.  Has had some post nasal drip.  Has had a more productive cough than usuall.  Mucous is really stressful.  Had a HA that lasted a week.  Tooks some tylenol sinus and that helped. Bridge of nose has been swollen.   Chronic back pain-she was seen Dr. Doroteo Bradford in [place]. I believe she was actually originally seen him here in [place] when he used to come here and then started seeing him in [place] when he moved locations. She says now it's too difficult to get there and is interested in pain management here locally. We'll place a referral sometime back but it was sent for [facility] and she said that that is difficult for her to do as well. She is asking for small quantity of hydrocodone today to use as needed for her back. She has used in the past very sparingly.   Review of Systems No urinary sxs.  No SOB.    Objective:   Physical Exam  Constitutional: She is oriented to person, place, and time. She appears well-developed and well-nourished.  HENT:  Head: Normocephalic and atraumatic.  Right Ear: External ear normal.  Left Ear: External ear normal.  Nose: Nose normal.  Mouth/Throat: Oropharynx is clear and moist.  TMs and canals are clear. Dermatitis in the left ear canal. Clear thick drainage in the posterior pharynx  Eyes: Conjunctivae and EOM are normal. Pupils are equal, round, and reactive to light.  Neck: Neck supple. No thyromegaly present.  Cardiovascular: Normal rate, regular rhythm and normal heart sounds.   Pulmonary/Chest: Effort normal and breath sounds normal. She has  no wheezes.  Lymphadenopathy:    She has no cervical adenopathy.  Neurological: She is alert and oriented to person, place, and time.  Skin: Skin is warm and dry.  Psychiatric: She has a normal mood and affect.          Assessment & Plan:  DM- Well controlled.  Due for urine micro. Continue current regimen. Since her A1c is under 6.5 monitor for hypoglycemia and call if this becomes recurrent. She is on aspirin, ACE inhibitor, statin. Followup in 3 months.  Acute sinusitis - Will tx with augmentin x 10 days. Call if not better in one week. Continue symptomatic care. Given prescription cough medication.  Tobacco abuse-with her long history and heavy smoking I suspect that she probably has COPD. She does have a rescue inhaler that she uses occasionally if she said. Will schedule for spirometry when she's better in about 2-3 weeks.  Chronic back pain-she says that the pain is a referral to [place] place was separate [facility] and is very difficult for her to get there. I will put in referral to Dr. Oneal Grout she is here locally and [place]. Patient was given a pamphlet. I did give her 30 tabs of hydrocodone to last her until she gets in with Dr. Oneal Grout. She was previously seeing Dr. Penni Homans in [place] it is very difficult for her to get there now.

## 2013-02-13 ENCOUNTER — Other Ambulatory Visit: Payer: Self-pay

## 2013-02-13 ENCOUNTER — Ambulatory Visit: Payer: Medicare Other | Admitting: Family Medicine

## 2013-02-14 LAB — MICROALBUMIN, URINE: Microalb, Ur: 0.5 mg/dL (ref 0.00–1.89)

## 2013-02-18 ENCOUNTER — Telehealth: Payer: Self-pay | Admitting: *Deleted

## 2013-02-18 MED ORDER — NYSTATIN 100000 UNIT/ML MT SUSP: 500000.0000 [IU] | Freq: Four times a day (QID) | OROMUCOSAL | Status: DC

## 2013-02-18 NOTE — Telephone Encounter (Signed)
rx sent

## 2013-02-18 NOTE — Telephone Encounter (Signed)
Pt calls & states that she now has thrush after finishing an abx & wants to know if you can send her that "horrible tasting mouthwash" to [facility].  Please advise

## 2013-02-19 ENCOUNTER — Other Ambulatory Visit: Payer: Self-pay | Admitting: Family Medicine

## 2013-02-19 NOTE — Telephone Encounter (Signed)
Pt notified of rx. 

## 2013-02-20 ENCOUNTER — Telehealth: Payer: Self-pay | Admitting: *Deleted

## 2013-02-20 DIAGNOSIS — K521 Toxic gastroenteritis and colitis: Secondary | ICD-10-CM

## 2013-02-20 NOTE — Telephone Encounter (Signed)
Pt calls today & states that you told her to call back in 7 days if no better.  She states that she still has terrible diarrhea, is very weak, & worn out.

## 2013-02-20 NOTE — Telephone Encounter (Signed)
Pt states that her sinuses are better.  She also states that she has diarrhea frequently but it's never lasted this long. She said that she will try to have someone come by to pick up jars.

## 2013-02-20 NOTE — Telephone Encounter (Signed)
Can check her stool for c diff.  We can order labs and she can come by to pick up samples jars. Are her sinuses any better?

## 2013-02-24 ENCOUNTER — Telehealth: Payer: Self-pay | Admitting: *Deleted

## 2013-02-24 NOTE — Telephone Encounter (Signed)
Pt called and wanted to know if she should still submit the stool samples since she is no longer having diarrhea. I spoke with Dr. Linford Arnold and she said she did not. However if she began having another episode to submit the sample pt voiced understanding and agreed.[name] [place]

## 2013-02-25 ENCOUNTER — Other Ambulatory Visit: Payer: Medicare Other | Admitting: Family Medicine

## 2013-03-05 DIAGNOSIS — M5137 Other intervertebral disc degeneration, lumbosacral region: Secondary | ICD-10-CM | POA: Diagnosis not present

## 2013-03-05 DIAGNOSIS — M5380 Other specified dorsopathies, site unspecified: Secondary | ICD-10-CM | POA: Diagnosis not present

## 2013-03-05 DIAGNOSIS — M47817 Spondylosis without myelopathy or radiculopathy, lumbosacral region: Secondary | ICD-10-CM | POA: Diagnosis not present

## 2013-04-02 ENCOUNTER — Other Ambulatory Visit: Payer: Self-pay | Admitting: Family Medicine

## 2013-04-03 DIAGNOSIS — Z79899 Other long term (current) drug therapy: Secondary | ICD-10-CM | POA: Diagnosis not present

## 2013-04-03 DIAGNOSIS — M5137 Other intervertebral disc degeneration, lumbosacral region: Secondary | ICD-10-CM | POA: Diagnosis not present

## 2013-04-03 DIAGNOSIS — M5380 Other specified dorsopathies, site unspecified: Secondary | ICD-10-CM | POA: Diagnosis not present

## 2013-04-03 DIAGNOSIS — M47817 Spondylosis without myelopathy or radiculopathy, lumbosacral region: Secondary | ICD-10-CM | POA: Diagnosis not present

## 2013-04-23 ENCOUNTER — Other Ambulatory Visit: Payer: Self-pay | Admitting: Family Medicine

## 2013-04-25 ENCOUNTER — Encounter (INDEPENDENT_AMBULATORY_CARE_PROVIDER_SITE_OTHER): Payer: Medicare Other | Admitting: *Deleted

## 2013-04-25 ENCOUNTER — Other Ambulatory Visit: Payer: Medicare Other | Admitting: *Deleted

## 2013-04-25 ENCOUNTER — Ambulatory Visit: Payer: Medicare Other | Admitting: Family

## 2013-04-25 ENCOUNTER — Other Ambulatory Visit: Payer: Medicare Other

## 2013-04-25 ENCOUNTER — Encounter: Payer: Self-pay | Admitting: Vascular Surgery

## 2013-04-25 DIAGNOSIS — Z48812 Encounter for surgical aftercare following surgery on the circulatory system: Secondary | ICD-10-CM

## 2013-04-25 DIAGNOSIS — I701 Atherosclerosis of renal artery: Secondary | ICD-10-CM

## 2013-04-25 DIAGNOSIS — I723 Aneurysm of iliac artery: Secondary | ICD-10-CM | POA: Diagnosis not present

## 2013-04-30 ENCOUNTER — Other Ambulatory Visit: Payer: Self-pay | Admitting: Family Medicine

## 2013-05-06 ENCOUNTER — Encounter: Payer: Self-pay | Admitting: Family Medicine

## 2013-05-07 ENCOUNTER — Encounter: Payer: Self-pay | Admitting: Family Medicine

## 2013-05-07 ENCOUNTER — Ambulatory Visit (INDEPENDENT_AMBULATORY_CARE_PROVIDER_SITE_OTHER): Payer: Medicare Other | Admitting: Family Medicine

## 2013-05-07 VITALS — BP 151/88 | HR 70 | Wt 200.0 lb

## 2013-05-07 DIAGNOSIS — Z23 Encounter for immunization: Secondary | ICD-10-CM | POA: Diagnosis not present

## 2013-05-07 DIAGNOSIS — M720 Palmar fascial fibromatosis [Dupuytren]: Secondary | ICD-10-CM | POA: Diagnosis not present

## 2013-05-07 DIAGNOSIS — N183 Chronic kidney disease, stage 3 unspecified: Secondary | ICD-10-CM

## 2013-05-07 DIAGNOSIS — M79609 Pain in unspecified limb: Secondary | ICD-10-CM

## 2013-05-07 DIAGNOSIS — E119 Type 2 diabetes mellitus without complications: Secondary | ICD-10-CM

## 2013-05-07 DIAGNOSIS — E1149 Type 2 diabetes mellitus with other diabetic neurological complication: Secondary | ICD-10-CM

## 2013-05-07 LAB — POCT GLYCOSYLATED HEMOGLOBIN (HGB A1C): Hemoglobin A1C: 7

## 2013-05-07 NOTE — Progress Notes (Signed)
Subjective:    Patient ID: [name], female    DOB: [birth year], 67 y.o.   MRN: [id number]  HPI Noticed some lumps in her palm on her right hand. Noticed it 3 weeks ago. The nodules themselves are nontender but she has been having sharp pains going into her fingers intermittently. Does get numbness and tingling in her hands intermittanly. Sometime happens at night. 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  She's been using ice to help her pain. A couple little bit. She has not been able to get the CAT scan that she is working on because of discomfort. No prior history or diagnosis of carpal tunnel syndrome. She also has pain at the base of the thumbs bilaterally. She's not taking any oral medications for this.  Diabetes-taking medications as prescribed. No major changes. No wounds or sores that are not healing well. Review of Systems  BP 151/88  Pulse 70  Wt 200 lb (90.719 kg)  BMI 31.32 kg/m2    No Known Allergies  Past Medical History  Diagnosis Date  . Hypertension   . Hyperlipidemia   . PUD (peptic ulcer disease)   . Charcot-Marie-Tooth disease   . Arthritis   . Neurogenic bladder   . Diabetic peripheral neuropathy   . Osteopenia   . Post-menopausal   . Insomnia   . COPD (chronic obstructive pulmonary disease)   . DDD (degenerative disc disease)     Lumbar and lumbosacral  . Tobacco dependence   . Thyroid disease     hypo  . Gastric ulcer     w/o hemorrhage  . Diabetes mellitus     type 2  . Depression   . Obesity   . Unspecified hereditary and idiopathic peripheral neuropathy   . Lumbosacral root lesions, not elsewhere classified   . Other symptoms referable to back   . Thoracic spondylosis without myelopathy   . Facet syndrome, lumbar   . Spinal stenosis, lumbar region, without neurogenic claudication     Past Surgical History  Procedure Laterality Date  . Cholecystectomy    . Replacement total knee    . Fluoroscopic l5 dorsal medial branch bloc     . Bilateral l3 medial branch block    . Fibroid tumor removal    . Renal artery stent  11/28/2010    left  . Ankle reconstruction      left ankle, plate and pins     History   Social History  . Marital Status: Widowed    Spouse Name: N/A    Number of Children: N/A  . Years of Education: N/A   Occupational History  . Not on file.   Social History Main Topics  . Smoking status: Current Every Day Smoker -- 1.00 packs/day for 49 years    Types: Cigarettes  . Smokeless tobacco: Never Used     Comment: pt states she is going to try the E-cig  . Alcohol Use: No  . Drug Use: No  . Sexual Activity: Not on file   Other Topics Concern  . Not on file   Social History Narrative  . No narrative on file    Family History  Problem Relation Age of Onset  . Stroke Mother   . Heart disease Father 70    heart attack  . Hypertension Father   . Cancer Father     lung  . Diabetes Paternal Aunt     insulin dependent    Outpatient Encounter  Prescriptions as of 05/07/2013  Medication Sig Dispense Refill  . alendronate (FOSAMAX) 70 MG tablet Take 1 tablet (70 mg total) by mouth every 7 (seven) days. Take with a full glass of water on an empty stomach.  4 tablet  11  . amLODipine (NORVASC) 5 MG tablet TAKE 1/2 TABLET ONCE DAILY  15 tablet  1  . aspirin EC 81 MG tablet Take 81 mg by mouth daily.      . benazepril (LOTENSIN) 20 MG tablet TAKE 1 TABLET DAILY.  30 tablet  2  . clopidogrel (PLAVIX) 75 MG tablet TAKE 1 TABLET DAILY  30 tablet  2  . furosemide (LASIX) 40 MG tablet TAKE 1 TABLET EVERY MORNING.  30 tablet  1  . gabapentin (NEURONTIN) 400 MG capsule Take 1 capsule (400 mg total) by mouth 3 (three) times daily.  90 capsule  5  . glucose blood test strip 1 each as needed. Use as instructed       . HYDROcodone-homatropine (HYCODAN) 5-1.5 MG/5ML syrup Take 5 mLs by mouth at bedtime as needed for cough.  180 mL  0  . Lancets MISC by Does not apply route.        Marland Kitchen LANTUS SOLOSTAR 100  UNIT/ML SOPN INJECT 48 UNITS INTO THE SKIN DAILY  15 mL  1  . levothyroxine (SYNTHROID, LEVOTHROID) 150 MCG tablet TAKE 1 TABLET DAILY ON EMPTY STOMACH  30 tablet  1  . meclizine (ANTIVERT) 50 MG tablet Take 0.5-1 tablets (25-50 mg total) by mouth 3 (three) times daily as needed for dizziness.  60 tablet  0  . metFORMIN (GLUCOPHAGE-XR) 500 MG 24 hr tablet TAKE 2 TABLETS DAILY WITH EVENING MEAL  60 tablet  0  . metoprolol (LOPRESSOR) 100 MG tablet TAKE 1 TABLET TWICE DAILY  60 tablet  1  . nitroGLYCERIN (NITROSTAT) 0.4 MG SL tablet Place 1 tablet (0.4 mg total) under the tongue every 5 (five) minutes as needed.  10 tablet  1  . simvastatin (ZOCOR) 20 MG tablet TAKE 1 AT BEDTIME  90 tablet  1  . traZODone (DESYREL) 150 MG tablet TAKE 1 TABLET AT BEDTIME.  30 tablet  3  . VENTOLIN HFA 108 (90 BASE) MCG/ACT inhaler Inhale 2 puffs into the lungs every 6 (six) hours as needed for wheezi ng.  18 each  3  . [DISCONTINUED] amoxicillin-clavulanate (AUGMENTIN) 875-125 MG per tablet Take 1 tablet by mouth 2 (two) times daily.  20 tablet  0  . [DISCONTINUED] HYDROcodone-acetaminophen (NORCO/VICODIN) 5-325 MG per tablet Take 1 tablet by mouth every 8 (eight) hours as needed for pain.  30 tablet  0  . [DISCONTINUED] nystatin (MYCOSTATIN) 100000 UNIT/ML suspension Take 5 mLs (500,000 Units total) by mouth 4 (four) times daily. Swish adn then swallow  120 mL  0  . [DISCONTINUED] traMADol (ULTRAM) 50 MG tablet Take 2 tablets (100 mg total) by mouth every 8 (eight) hours as needed for pain. Take 2 tabs 3 times/ day  180 tablet  2   No facility-administered encounter medications on file as of 05/07/2013.          Objective:   Physical Exam  Constitutional: She is oriented to person, place, and time. She appears well-developed and well-nourished.  HENT:  Head: Normocephalic and atraumatic.  Neurological: She is alert and oriented to person, place, and time.  Skin: Skin is warm and dry.  He has nodules over the  flexor tendons in the palm of her right hand. They're  nontender. Negative Phalen's and Tinel's sign. Wrist with normal range of motion. Good grip and strength in both hands. No nodules on the joints.  Psychiatric: She has a normal mood and affect. Her behavior is normal.          Assessment & Plan:  Dupuytren's contractures = discussed diagnosis. Handout given to explain this. She is able to lay her hand completely flat and so does not need any surgical intervention at this time   Carpal tunnel- I strongly suspect carpal tunnel syndrome. I would like to try having her wear her wrist cockup splints at night. Continue to use the restroom today. She can use the splints during the day and to see if it helps. If pain in the hands not improving over the next 3-4 weeks then encouraged her to followup.  Diabetes-Her A1c is 7.3 today. Well controlled. Currently taking a statin, aspirin, ACE inhibitor. Followup in 3 months.

## 2013-05-07 NOTE — Patient Instructions (Signed)
Dupuytren's Contracture  Dupuytren's contracture affects the fingers and the palm of the hand. This condition usually develops slowly. It may take many years to develop. The pinky finger and the ring finger are most often affected. These fingers start to curve inward, like a claw. At some point, the fingers cannot go straight anymore. This can make it hard to do things like:  · Put on gloves.  · Shake hands.  · Grab something off a shelf.  The condition usually does not cause pain and is not dangerous.  The condition gets its name from the doctor who came up with an operation to fix the problem. His name was Baron Guillaume Dupuytren. Contracture means pulling inward.  CAUSES   Dupuytren's contracture does not start with the fingers. It starts in the palm of the hand, under the skin. The tissue under the skin is called fascia. The fascia covers the cords (tendons) that control how the fingers move. In Dupuytren's contracture the fascia tissue becomes thick and then pulls on the cords. That causes the fingers to curl. The condition can affect both hands and any fingers, but it usually strikes one hand worse than the other. The fingers farthest from the thumb are most often the ones that curl. The cause is not clear. Some experts believe it results from an autoimmune reaction. That means the body's immune system (which fights off disease) attacks itself by mistake. What experts do know is that certain conditions and behaviors (called risk factors) make the chance of having this condition more likely. They include:  · Age. Most people who have the condition are older than 50.  · Sex. It affects men more often than women.  · Family history. The condition tends to run in families from countries in [place].  · Certain behaviors. People who smoke and drink alcohol are more apt to develop the problem.  · Some other medical conditions. Having diabetes makes Dupuytren's contracture more likely. So does  having a condition that involves a seizure (when the brain's function is interrupted).  SYMPTOMS   Signs of this condition take time to develop. Sometimes this takes weeks or months. More often, it takes several years.   · Early symptoms:  · Skin on the palm of the hand becomes thick. This is usually the first sign.  · The skin may look dimpled or puckered.  · Lumps (nodules) show up on the palm. There may be one or more lumps. They are not painful.  · Later symptoms:  · Thick cords of tissue form in the palm of the hand.  · The pinky and ring fingers start to curl up into the palm.  · The fingers cannot be straightened into their normal position.  DIAGNOSIS   A physical examination is the main way that a healthcare provider can tell if you have Dupuytren's contracture. Other tests usually are not needed. The caregiver will probably:  · Look at your hands. Feel your hands. This is to check for thickening and nodules.  · Measure finger motion. This tells how much your fingers have contracted (pulled in).  · Do a tabletop test. You will be asked to try to put your hand flat on a table, palm down.  TREATMENT   There is no cure for Dupuytren's contracture. But there are ways to treat the symptoms. Options include:  · Watching and waiting. The condition develops slowly. Often it does not create problems for a long time. Sometimes the   skin gets thick and nodules form, but the fingers never curl. So, in some cases it is best to just watch the condition carefully and wait to see what happens.  · Shots (injections). Different substances may be injected, including:  · Steroids. These drugs block swelling. These shots should make the condition less uncomfortable. Steroids may also slow down the condition. Shots are given into the nodules. The effect only lasts awhile. More shots may have to be given.  · Enzymes. These are proteins. They weaken the thick tissue. After an injection, the caregiver usually stretches the  fingers.  · Needling. A needle is pushed through the skin and into the thick tissue. This is done in several spots. The goal is to break up the thickened tissue. Or to weaken it.  · Surgery. This may be suggested if you cannot grasp objects. Or, if you can no longer put your hand in your pocket.  · A cut (incision) is made in the palm of the hand. The thick tissue is removed.  · Sometimes the thick tissue is attached to the skin. Then, the skin must be removed, too. It is replaced with a piece of skin from another place on your body. That is called a skin graft.  · Occupational or hand therapy is almost always needed after surgery. This involves special exercises to get back the use of your hand and fingers. After a skin graft, several months of therapy may be needed.  · Sometimes the condition comes back, even after surgery.  · Other methods. You can do some things on your own. They include:  · Stretching the fingers backwards. Do this often.  · Warming the hand and massaging it. Again, do this often.  · Using tools with padded grips. This should make things easier.  · Wearing heavy gloves while working. This protects the hands.  PROGNOSIS   Dupuytren's contracture usually develops slowly. There is no cure. But, the symptoms can be treated. Sometimes they come back after treatment, but not always. It is important to remember that this is a functional problem and not a life-threatening condition.  Document Released: 05/21/2009 Document Revised: 10/16/2011 Document Reviewed: 05/21/2009  [employer]® Patient Information ©2014 [employer].

## 2013-05-21 ENCOUNTER — Other Ambulatory Visit: Payer: Self-pay | Admitting: Family Medicine

## 2013-05-22 ENCOUNTER — Other Ambulatory Visit: Payer: Self-pay | Admitting: Family Medicine

## 2013-05-28 DIAGNOSIS — N952 Postmenopausal atrophic vaginitis: Secondary | ICD-10-CM | POA: Insufficient documentation

## 2013-05-28 DIAGNOSIS — R3129 Other microscopic hematuria: Secondary | ICD-10-CM | POA: Insufficient documentation

## 2013-05-28 DIAGNOSIS — N35919 Unspecified urethral stricture, male, unspecified site: Secondary | ICD-10-CM | POA: Insufficient documentation

## 2013-06-03 DIAGNOSIS — R319 Hematuria, unspecified: Secondary | ICD-10-CM | POA: Diagnosis not present

## 2013-06-03 DIAGNOSIS — N319 Neuromuscular dysfunction of bladder, unspecified: Secondary | ICD-10-CM | POA: Diagnosis not present

## 2013-06-05 ENCOUNTER — Other Ambulatory Visit: Payer: Self-pay | Admitting: Family Medicine

## 2013-06-09 DIAGNOSIS — M5380 Other specified dorsopathies, site unspecified: Secondary | ICD-10-CM | POA: Diagnosis not present

## 2013-06-09 DIAGNOSIS — Z79899 Other long term (current) drug therapy: Secondary | ICD-10-CM | POA: Diagnosis not present

## 2013-06-09 DIAGNOSIS — M5137 Other intervertebral disc degeneration, lumbosacral region: Secondary | ICD-10-CM | POA: Diagnosis not present

## 2013-06-09 DIAGNOSIS — M47817 Spondylosis without myelopathy or radiculopathy, lumbosacral region: Secondary | ICD-10-CM | POA: Diagnosis not present

## 2013-06-25 ENCOUNTER — Other Ambulatory Visit: Payer: Self-pay | Admitting: Family Medicine

## 2013-06-26 ENCOUNTER — Other Ambulatory Visit: Payer: Self-pay | Admitting: Family Medicine

## 2013-07-07 ENCOUNTER — Other Ambulatory Visit: Payer: Self-pay | Admitting: Family Medicine

## 2013-07-11 ENCOUNTER — Other Ambulatory Visit: Payer: Self-pay | Admitting: Family Medicine

## 2013-07-26 ENCOUNTER — Other Ambulatory Visit: Payer: Self-pay | Admitting: Family Medicine

## 2013-07-28 ENCOUNTER — Other Ambulatory Visit: Payer: Self-pay | Admitting: Family Medicine

## 2013-08-08 ENCOUNTER — Other Ambulatory Visit: Payer: Self-pay | Admitting: *Deleted

## 2013-08-08 ENCOUNTER — Encounter: Payer: Self-pay | Admitting: Family Medicine

## 2013-08-08 ENCOUNTER — Ambulatory Visit (INDEPENDENT_AMBULATORY_CARE_PROVIDER_SITE_OTHER): Payer: Medicare Other | Admitting: Family Medicine

## 2013-08-08 VITALS — BP 137/65 | HR 56 | Temp 97.8°F | Ht 67.0 in | Wt 198.0 lb

## 2013-08-08 DIAGNOSIS — E1142 Type 2 diabetes mellitus with diabetic polyneuropathy: Secondary | ICD-10-CM

## 2013-08-08 DIAGNOSIS — E1140 Type 2 diabetes mellitus with diabetic neuropathy, unspecified: Secondary | ICD-10-CM

## 2013-08-08 DIAGNOSIS — E1149 Type 2 diabetes mellitus with other diabetic neurological complication: Secondary | ICD-10-CM | POA: Diagnosis not present

## 2013-08-08 DIAGNOSIS — I129 Hypertensive chronic kidney disease with stage 1 through stage 4 chronic kidney disease, or unspecified chronic kidney disease: Secondary | ICD-10-CM | POA: Diagnosis not present

## 2013-08-08 DIAGNOSIS — E039 Hypothyroidism, unspecified: Secondary | ICD-10-CM | POA: Diagnosis not present

## 2013-08-08 DIAGNOSIS — N183 Chronic kidney disease, stage 3 unspecified: Secondary | ICD-10-CM

## 2013-08-08 DIAGNOSIS — E785 Hyperlipidemia, unspecified: Secondary | ICD-10-CM | POA: Diagnosis not present

## 2013-08-08 DIAGNOSIS — I10 Essential (primary) hypertension: Secondary | ICD-10-CM

## 2013-08-08 LAB — COMPLETE METABOLIC PANEL WITH GFR
ALK PHOS: 40 U/L (ref 39–117)
ALT: 14 U/L (ref 0–35)
AST: 15 U/L (ref 0–37)
Albumin: 4.1 g/dL (ref 3.5–5.2)
BUN: 16 mg/dL (ref 6–23)
CO2: 30 mEq/L (ref 19–32)
CREATININE: 1.01 mg/dL (ref 0.50–1.10)
Calcium: 9.9 mg/dL (ref 8.4–10.5)
Chloride: 100 mEq/L (ref 96–112)
GFR, EST NON AFRICAN AMERICAN: 57 mL/min — AB
GFR, Est African American: 66 mL/min
GLUCOSE: 101 mg/dL — AB (ref 70–99)
Potassium: 4.5 mEq/L (ref 3.5–5.3)
Sodium: 139 mEq/L (ref 135–145)
Total Bilirubin: 0.9 mg/dL (ref 0.3–1.2)
Total Protein: 6.6 g/dL (ref 6.0–8.3)

## 2013-08-08 LAB — LIPID PANEL
Cholesterol: 121 mg/dL (ref 0–200)
HDL: 38 mg/dL — ABNORMAL LOW (ref >39)
LDL Cholesterol: 48 mg/dL (ref 0–99)
Total CHOL/HDL Ratio: 3.2 Ratio
Triglycerides: 176 mg/dL — ABNORMAL HIGH (ref <150)
VLDL: 35 mg/dL (ref 0–40)

## 2013-08-08 LAB — TSH: TSH: 0.081 u[IU]/mL — AB (ref 0.350–4.500)

## 2013-08-08 MED ORDER — AMLODIPINE BESYLATE 5 MG PO TABS: ORAL_TABLET | ORAL | Status: DC

## 2013-08-08 MED ORDER — LEVOTHYROXINE SODIUM 150 MCG PO TABS: ORAL_TABLET | ORAL | Status: DC

## 2013-08-08 MED ORDER — METOPROLOL TARTRATE 100 MG PO TABS: ORAL_TABLET | ORAL | Status: DC

## 2013-08-08 MED ORDER — DICYCLOMINE HCL 20 MG PO TABS: 20.0000 mg | ORAL_TABLET | Freq: Three times a day (TID) | ORAL | Status: DC | PRN

## 2013-08-08 MED ORDER — FUROSEMIDE 40 MG PO TABS: ORAL_TABLET | ORAL | Status: DC

## 2013-08-08 NOTE — Progress Notes (Signed)
   Subjective:    Patient ID: [name], female    DOB: Mar 17, 1946, 68 y.o.   MRN: [id number]  HPI Diabetes - no hypoglycemic events. No wounds or sores that are not healing well. No increased thirst or urination. Checking glucose at home. Taking medications as prescribed without any side effects.  Sugars are usually under 100.   Hypertension- Pt denies chest pain, SOB, dizziness, or heart palpitations.  Taking meds as directed w/o problems.  Denies medication side effect  Hypothyroidism-no skin or hair changes. No weight changes. Happy with current regimen. No changes in energy level or fatigue.  Review of Systems     Objective:   Physical Exam  Constitutional: She is oriented to person, place, and time. She appears well-developed and well-nourished.  HENT:  Head: Normocephalic and atraumatic.  Cardiovascular: Normal rate, regular rhythm and normal heart sounds.   Pulmonary/Chest: Effort normal and breath sounds normal.  Neurological: She is alert and oriented to person, place, and time.  Skin: Skin is warm and dry.  Psychiatric: She has a normal mood and affect. Her behavior is normal.          Assessment & Plan:  Diabetes-follow up in 3 months. She's currently on a statin, ACE, on ASA.  Eye exam is UTD.    Hypertension-  well-controlled. Continue current regimen.Due for CMP.    hyperlipidemia-due to recheck lipids and liver check.   CKD 3 - due to recheck kidney function.   Hypothyroidism-recheck TSH today.

## 2013-08-11 ENCOUNTER — Other Ambulatory Visit: Payer: Self-pay | Admitting: Family Medicine

## 2013-08-11 MED ORDER — LEVOTHYROXINE SODIUM 137 MCG PO TABS: 137.0000 ug | ORAL_TABLET | Freq: Every day | ORAL | Status: DC

## 2013-08-13 ENCOUNTER — Other Ambulatory Visit: Payer: Self-pay | Admitting: Family Medicine

## 2013-08-15 ENCOUNTER — Telehealth: Payer: Self-pay | Admitting: *Deleted

## 2013-08-15 MED ORDER — METFORMIN HCL 500 MG PO TABS: 1500.0000 mg | ORAL_TABLET | Freq: Every day | ORAL | Status: DC

## 2013-08-15 MED ORDER — GLIPIZIDE 5 MG PO TABS: 5.0000 mg | ORAL_TABLET | Freq: Every day | ORAL | Status: DC

## 2013-08-15 NOTE — Telephone Encounter (Signed)
We can try increasing the metform and add glipizide but this won't take place of 40 units of insulin but will help. We can try this until she meets her deductible if she would like

## 2013-08-15 NOTE — Telephone Encounter (Signed)
Pt stated that her copay for lantus $354.00 she stated that her deductible 320.00 . She cannot afford this and wanted to know if there is an oral med that she can take instead. Please advise.[name]

## 2013-08-15 NOTE — Telephone Encounter (Signed)
Called and informed pt of recommendation to increase metformin dose to 1000 mg, she stated that she currently takes metformin 1000 mg at night. She stated that she once took more of this.  Dr.metheney recommends changing her metformin xr to regular. She can take 1500 mg once a day until she runs out and then adding on glipizide 5 mg daily in addition to metformin 1500. Will send in new rx .[name]   Pt informed and agreed.[name]

## 2013-08-27 ENCOUNTER — Other Ambulatory Visit: Payer: Self-pay | Admitting: *Deleted

## 2013-08-27 MED ORDER — TRAZODONE HCL 150 MG PO TABS: ORAL_TABLET | ORAL | Status: DC

## 2013-08-27 MED ORDER — CLOPIDOGREL BISULFATE 75 MG PO TABS: ORAL_TABLET | ORAL | Status: DC

## 2013-08-27 MED ORDER — BENAZEPRIL HCL 20 MG PO TABS: ORAL_TABLET | ORAL | Status: DC

## 2013-08-27 MED ORDER — ALENDRONATE SODIUM 70 MG PO TABS: ORAL_TABLET | ORAL | Status: DC

## 2013-09-08 ENCOUNTER — Other Ambulatory Visit: Payer: Self-pay | Admitting: *Deleted

## 2013-09-08 MED ORDER — METOPROLOL TARTRATE 100 MG PO TABS: ORAL_TABLET | ORAL | Status: DC

## 2013-09-08 MED ORDER — LEVOTHYROXINE SODIUM 137 MCG PO TABS
137.0000 ug | ORAL_TABLET | Freq: Every day | ORAL | Status: DC
Start: 2013-09-08 — End: 2013-11-05

## 2013-09-08 MED ORDER — AMLODIPINE BESYLATE 5 MG PO TABS: ORAL_TABLET | ORAL | Status: DC

## 2013-09-08 MED ORDER — FUROSEMIDE 40 MG PO TABS: ORAL_TABLET | ORAL | Status: DC

## 2013-09-24 ENCOUNTER — Other Ambulatory Visit: Payer: Self-pay | Admitting: *Deleted

## 2013-09-24 MED ORDER — BENAZEPRIL HCL 20 MG PO TABS: ORAL_TABLET | ORAL | Status: DC

## 2013-09-24 MED ORDER — CLOPIDOGREL BISULFATE 75 MG PO TABS: ORAL_TABLET | ORAL | Status: DC

## 2013-09-24 MED ORDER — ALENDRONATE SODIUM 70 MG PO TABS: ORAL_TABLET | ORAL | Status: DC

## 2013-09-29 ENCOUNTER — Other Ambulatory Visit: Payer: Self-pay | Admitting: *Deleted

## 2013-09-29 DIAGNOSIS — M47817 Spondylosis without myelopathy or radiculopathy, lumbosacral region: Secondary | ICD-10-CM | POA: Diagnosis not present

## 2013-09-29 DIAGNOSIS — M5137 Other intervertebral disc degeneration, lumbosacral region: Secondary | ICD-10-CM | POA: Diagnosis not present

## 2013-09-29 DIAGNOSIS — M5380 Other specified dorsopathies, site unspecified: Secondary | ICD-10-CM | POA: Diagnosis not present

## 2013-09-29 DIAGNOSIS — Z79899 Other long term (current) drug therapy: Secondary | ICD-10-CM | POA: Diagnosis not present

## 2013-09-29 MED ORDER — TRAZODONE HCL 150 MG PO TABS: ORAL_TABLET | ORAL | Status: DC

## 2013-10-08 ENCOUNTER — Other Ambulatory Visit: Payer: Self-pay | Admitting: *Deleted

## 2013-10-08 MED ORDER — METOPROLOL TARTRATE 100 MG PO TABS: ORAL_TABLET | ORAL | Status: DC

## 2013-10-08 MED ORDER — AMLODIPINE BESYLATE 5 MG PO TABS: ORAL_TABLET | ORAL | Status: DC

## 2013-10-14 ENCOUNTER — Encounter: Payer: Self-pay | Admitting: Family Medicine

## 2013-10-14 ENCOUNTER — Ambulatory Visit (INDEPENDENT_AMBULATORY_CARE_PROVIDER_SITE_OTHER): Payer: Medicare Other | Admitting: Family Medicine

## 2013-10-14 VITALS — BP 101/61 | HR 49 | Wt 202.0 lb

## 2013-10-14 DIAGNOSIS — E1149 Type 2 diabetes mellitus with other diabetic neurological complication: Secondary | ICD-10-CM | POA: Diagnosis not present

## 2013-10-14 DIAGNOSIS — E1142 Type 2 diabetes mellitus with diabetic polyneuropathy: Secondary | ICD-10-CM | POA: Diagnosis not present

## 2013-10-14 DIAGNOSIS — E039 Hypothyroidism, unspecified: Secondary | ICD-10-CM | POA: Diagnosis not present

## 2013-10-14 DIAGNOSIS — N183 Chronic kidney disease, stage 3 unspecified: Secondary | ICD-10-CM | POA: Diagnosis not present

## 2013-10-14 DIAGNOSIS — E1140 Type 2 diabetes mellitus with diabetic neuropathy, unspecified: Secondary | ICD-10-CM

## 2013-10-14 DIAGNOSIS — R609 Edema, unspecified: Secondary | ICD-10-CM

## 2013-10-14 DIAGNOSIS — R600 Localized edema: Secondary | ICD-10-CM

## 2013-10-14 LAB — COMPLETE METABOLIC PANEL WITH GFR
ALBUMIN: 3.8 g/dL (ref 3.5–5.2)
ALT: 9 U/L (ref 0–35)
AST: 11 U/L (ref 0–37)
Alkaline Phosphatase: 40 U/L (ref 39–117)
BILIRUBIN TOTAL: 0.7 mg/dL (ref 0.2–1.2)
BUN: 15 mg/dL (ref 6–23)
CALCIUM: 9.9 mg/dL (ref 8.4–10.5)
CO2: 31 mEq/L (ref 19–32)
Chloride: 100 mEq/L (ref 96–112)
Creat: 0.98 mg/dL (ref 0.50–1.10)
GFR, EST NON AFRICAN AMERICAN: 59 mL/min — AB
GFR, Est African American: 68 mL/min
GLUCOSE: 73 mg/dL (ref 70–99)
Potassium: 5 mEq/L (ref 3.5–5.3)
Sodium: 142 mEq/L (ref 135–145)
Total Protein: 6.2 g/dL (ref 6.0–8.3)

## 2013-10-14 LAB — TSH: TSH: 0.104 u[IU]/mL — ABNORMAL LOW (ref 0.350–4.500)

## 2013-10-14 LAB — POCT GLYCOSYLATED HEMOGLOBIN (HGB A1C): Hemoglobin A1C: 6.5

## 2013-10-14 MED ORDER — FUROSEMIDE 40 MG PO TABS: 40.0000 mg | ORAL_TABLET | Freq: Every day | ORAL | Status: DC

## 2013-10-14 MED ORDER — SIMVASTATIN 40 MG PO TABS: ORAL_TABLET | ORAL | Status: DC

## 2013-10-14 MED ORDER — HYDROCORTISONE 2.5 % EX CREA: TOPICAL_CREAM | Freq: Every day | CUTANEOUS | Status: DC | PRN

## 2013-10-14 NOTE — Progress Notes (Signed)
Subjective:    Patient ID: [name], female    DOB: 1945/12/16, 68 y.o.   MRN: [id number]  HPI She also complains of edema. Her left kidney function January was normal. Says the left is chonically swollen and is worse by the end of the day. Today woke up and right foot was swollen. No injury. No change in diet.  No unusual activities yesterday that may have triggered it.   Hypothyroidism. She had blood work in January which showed a TSH that was too suppressed. dec thyroid dose from 150 to 137. She is due to repeat his labs today.  CKD- she wants to discuss this diagnosis and with me. She noticed it on her paperwork at the last office visit and wants to know if her started or what she can do to prevent any progression of kidney disease.  Dry itchey skin on her face. She typically uses a light patency topical steroid. Typically happens 1C around this time. Would like a refill on hydrocortisone 2.5% cream for 7 days. She denies any new moisturizers or soaps et Ronney Asters. She said she went outside this weekend and the next day her skin on her face with very itchy.  Review of Systems  BP 101/61  Pulse 49  Wt 202 lb (91.627 kg)  SpO2 96%    No Known Allergies  Past Medical History  Diagnosis Date  . Hypertension   . Hyperlipidemia   . PUD (peptic ulcer disease)   . Charcot-Marie-Tooth disease   . Arthritis   . Neurogenic bladder   . Diabetic peripheral neuropathy   . Osteopenia   . Post-menopausal   . Insomnia   . COPD (chronic obstructive pulmonary disease)   . DDD (degenerative disc disease)     Lumbar and lumbosacral  . Tobacco dependence   . Thyroid disease     hypo  . Gastric ulcer     w/o hemorrhage  . Diabetes mellitus     type 2  . Depression   . Obesity   . Unspecified hereditary and idiopathic peripheral neuropathy   . Lumbosacral root lesions, not elsewhere classified   . Other symptoms referable to back   . Thoracic spondylosis without myelopathy   .  Facet syndrome, lumbar   . Spinal stenosis, lumbar region, without neurogenic claudication     Past Surgical History  Procedure Laterality Date  . Cholecystectomy    . Replacement total knee    . Fluoroscopic l5 dorsal medial branch bloc    . Bilateral l3 medial branch block    . Fibroid tumor removal    . Renal artery stent  11/28/2010    left  . Ankle reconstruction      left ankle, plate and pins     History   Social History  . Marital Status: Widowed    Spouse Name: N/A    Number of Children: N/A  . Years of Education: N/A   Occupational History  . Not on file.   Social History Main Topics  . Smoking status: Current Every Day Smoker -- 1.00 packs/day for 49 years    Types: Cigarettes  . Smokeless tobacco: Never Used     Comment: pt states she is going to try the E-cig  . Alcohol Use: No  . Drug Use: No  . Sexual Activity: Not on file   Other Topics Concern  . Not on file   Social History Narrative  . No narrative on file  Family History  Problem Relation Age of Onset  . Stroke Mother   . Heart disease Father 68    heart attack  . Hypertension Father   . Cancer Father     lung  . Diabetes Paternal Aunt     insulin dependent    Outpatient Encounter Prescriptions as of 10/14/2013  Medication Sig  . alendronate (FOSAMAX) 70 MG tablet TAKE 1 TABLET WEEKLY WITH A FULL GLASS OF WATER AND ON AN EMPTY STOMACH  . amLODipine (NORVASC) 5 MG tablet TAKE 1/2 TABLET ONCE DAILY  . aspirin EC 81 MG tablet Take 81 mg by mouth daily.  . benazepril (LOTENSIN) 20 MG tablet TAKE 1 TABLET DAILY.  Marland Kitchen clopidogrel (PLAVIX) 75 MG tablet TAKE 1 TABLET DAILY  . dicyclomine (BENTYL) 20 MG tablet Take 1 tablet (20 mg total) by mouth 3 (three) times daily as needed for spasms. And diarrhea  . furosemide (LASIX) 40 MG tablet Take 1-2 tablets (40-80 mg total) by mouth daily. TAKE 1 TABLET EVERY MORNING.  Marland Kitchen gabapentin (NEURONTIN) 400 MG capsule Take 1 capsule (400 mg total) by mouth  3 (three) times daily.  Marland Kitchen glipiZIDE (GLUCOTROL) 5 MG tablet Take 1 tablet (5 mg total) by mouth daily before breakfast.  . glucose blood test strip 1 each as needed. Use as instructed   . Lancets MISC by Does not apply route.    Marland Kitchen LANTUS SOLOSTAR 100 UNIT/ML Solostar Pen INJECT 48 UNITS INTO THE SKIN DAILY  . levothyroxine (SYNTHROID, LEVOTHROID) 137 MCG tablet Take 1 tablet (137 mcg total) by mouth daily before breakfast. TAKE 1 TABLET DAILY ON EMPTY STOMACH  . meclizine (ANTIVERT) 50 MG tablet Take 0.5-1 tablets (25-50 mg total) by mouth 3 (three) times daily as needed for dizziness.  . metFORMIN (GLUCOPHAGE) 500 MG tablet Take 3 tablets (1,500 mg total) by mouth daily. Take 3 tablets (1500 mg) once a day  . metoprolol (LOPRESSOR) 100 MG tablet TAKE 1 TABLET TWICE DAILY  . nitroGLYCERIN (NITROSTAT) 0.4 MG SL tablet Place 1 tablet (0.4 mg total) under the tongue every 5 (five) minutes as needed.  . simvastatin (ZOCOR) 20 MG tablet TAKE 1 AT BEDTIME  . traZODone (DESYREL) 150 MG tablet TAKE 1 TABLET AT BEDTIME.  . VENTOLIN HFA 108 (90 BASE) MCG/ACT inhaler Inhale 2 puffs into the lungs every 6 (six) hours as needed for wheezi ng.  . [DISCONTINUED] furosemide (LASIX) 40 MG tablet TAKE 1 TABLET EVERY MORNING.  . hydrocortisone 2.5 % cream Apply topically daily as needed. X 7 days.  . [DISCONTINUED] metFORMIN (GLUCOPHAGE-XR) 500 MG 24 hr tablet TAKE 2 TABLETS DAILY WITH EVENING MEAL          Objective:   Physical Exam  Constitutional: She is oriented to person, place, and time. She appears well-developed and well-nourished.  HENT:  Head: Normocephalic and atraumatic.  Cardiovascular: Normal rate, regular rhythm and normal heart sounds.   Pulmonary/Chest: Effort normal and breath sounds normal.  Musculoskeletal:  LE edema to mid tibia on the right foot and edema up to knee on the left.  DP pulse 2+ and post tibial pulse 1+ .   Neurological: She is alert and oriented to person, place, and  time.  Skin: Skin is warm and dry. No rash noted. No erythema. No pallor.  Psychiatric: She has a normal mood and affect. Her behavior is normal.          Assessment & Plan:  DM- dicussed that she really needs to be  on a moderate dose statin.  Will increase simvastatin to 40 mg. A1c is well-controlled her looks fantastic today. Followup in 3 months.  Hypothyroid - due to recheck TSH since we adjusted her dose about 4-6 weeks ago. She's feeling well on her current regimen.  Lower extremity edema  - unclear what the trigger may have been for the recent onset of right lower Schimke edema. It's mild but is apparent on exam today. We'll recheck kidney function as well as thyroid. Make sure eating low salt diet and wear compression stockings regularly. She denies any trauma or injury. Okay to take an occasional second dose of Lasix but do not want to do this regularly as it can affect the kidneys.  CKD- discussed idagnoses and ways to control blood pressure and diabetes and is the maximal way to maintain her current kidney function. We also have to be very careful with her diuretics and not over dry out the kidneys..    Eczema on face-will treat with topical hydrocortisone cream. New perception sent to pharmacy.

## 2013-10-14 NOTE — Addendum Note (Signed)
Addended by: [name] D on: 10/14/2013 01:29 PM   Modules accepted: Orders

## 2013-10-14 NOTE — Progress Notes (Signed)
[name] from [facility] called and stated that [name] was unable to submit urine sample so he gave her a cup to take with her and placed the urine order on hold.Audelia Hives [place]

## 2013-10-15 ENCOUNTER — Other Ambulatory Visit: Payer: Self-pay | Admitting: *Deleted

## 2013-10-15 DIAGNOSIS — E039 Hypothyroidism, unspecified: Secondary | ICD-10-CM

## 2013-10-17 ENCOUNTER — Other Ambulatory Visit: Payer: Self-pay | Admitting: Family Medicine

## 2013-10-17 DIAGNOSIS — E1149 Type 2 diabetes mellitus with other diabetic neurological complication: Secondary | ICD-10-CM | POA: Diagnosis not present

## 2013-10-17 DIAGNOSIS — R609 Edema, unspecified: Secondary | ICD-10-CM | POA: Diagnosis not present

## 2013-10-17 DIAGNOSIS — E039 Hypothyroidism, unspecified: Secondary | ICD-10-CM | POA: Diagnosis not present

## 2013-10-17 DIAGNOSIS — E1142 Type 2 diabetes mellitus with diabetic polyneuropathy: Secondary | ICD-10-CM | POA: Diagnosis not present

## 2013-10-18 LAB — URINALYSIS, MICROSCOPIC ONLY
Bacteria, UA: NONE SEEN
CRYSTALS: NONE SEEN
Casts: NONE SEEN
SQUAMOUS EPITHELIAL / LPF: NONE SEEN

## 2013-10-18 LAB — URINALYSIS, ROUTINE W REFLEX MICROSCOPIC
Bilirubin Urine: NEGATIVE
Glucose, UA: NEGATIVE mg/dL
Hgb urine dipstick: NEGATIVE
Ketones, ur: NEGATIVE mg/dL
Leukocytes, UA: NEGATIVE
Nitrite: POSITIVE — AB
Protein, ur: NEGATIVE mg/dL
Specific Gravity, Urine: 1.011 (ref 1.005–1.030)
Urobilinogen, UA: 0.2 mg/dL (ref 0.0–1.0)
pH: 5.5 (ref 5.0–8.0)

## 2013-10-20 ENCOUNTER — Other Ambulatory Visit: Payer: Self-pay | Admitting: Family Medicine

## 2013-10-22 ENCOUNTER — Other Ambulatory Visit: Payer: Self-pay | Admitting: *Deleted

## 2013-10-22 MED ORDER — BENAZEPRIL HCL 20 MG PO TABS: ORAL_TABLET | ORAL | Status: DC

## 2013-10-22 MED ORDER — CLOPIDOGREL BISULFATE 75 MG PO TABS: ORAL_TABLET | ORAL | Status: DC

## 2013-10-24 ENCOUNTER — Telehealth: Payer: Self-pay | Admitting: *Deleted

## 2013-10-24 NOTE — Telephone Encounter (Signed)
Pt calls triage line and states she is having a sinus headache, facial pain, and drainage and congestion.  States she has had this a lot and knows its a sinus infection and wants antibiotic.  Informed pt that you would not write for an antibiotic without being seen first, so patient proceeds to tell me she will just suffer through it then because she was too sick to come in.  I advised patient that if it was a sinus infection she really needed to be treated and if decided she wanted to be seen then please give our office a call to schedule appt. Clemetine Marker, LPN

## 2013-10-30 ENCOUNTER — Other Ambulatory Visit: Payer: Self-pay | Admitting: *Deleted

## 2013-10-30 MED ORDER — METFORMIN HCL 500 MG PO TABS: 1500.0000 mg | ORAL_TABLET | Freq: Every day | ORAL | Status: DC

## 2013-10-30 MED ORDER — ALENDRONATE SODIUM 70 MG PO TABS: ORAL_TABLET | ORAL | Status: DC

## 2013-11-05 ENCOUNTER — Other Ambulatory Visit: Payer: Self-pay | Admitting: Family Medicine

## 2013-11-10 ENCOUNTER — Other Ambulatory Visit: Payer: Self-pay | Admitting: Family Medicine

## 2013-11-26 ENCOUNTER — Other Ambulatory Visit: Payer: Self-pay | Admitting: Family Medicine

## 2013-12-04 ENCOUNTER — Other Ambulatory Visit: Payer: Self-pay | Admitting: Family Medicine

## 2013-12-10 ENCOUNTER — Other Ambulatory Visit: Payer: Self-pay | Admitting: Family Medicine

## 2013-12-16 ENCOUNTER — Other Ambulatory Visit: Payer: Self-pay | Admitting: Family Medicine

## 2013-12-24 ENCOUNTER — Other Ambulatory Visit: Payer: Self-pay | Admitting: Family Medicine

## 2014-01-09 ENCOUNTER — Other Ambulatory Visit: Payer: Self-pay | Admitting: Family Medicine

## 2014-01-14 ENCOUNTER — Ambulatory Visit (INDEPENDENT_AMBULATORY_CARE_PROVIDER_SITE_OTHER): Payer: Medicare Other | Admitting: Family Medicine

## 2014-01-14 ENCOUNTER — Encounter: Payer: Self-pay | Admitting: Family Medicine

## 2014-01-14 VITALS — BP 113/69 | HR 49 | Wt 196.0 lb

## 2014-01-14 DIAGNOSIS — I129 Hypertensive chronic kidney disease with stage 1 through stage 4 chronic kidney disease, or unspecified chronic kidney disease: Secondary | ICD-10-CM | POA: Diagnosis not present

## 2014-01-14 DIAGNOSIS — E1149 Type 2 diabetes mellitus with other diabetic neurological complication: Secondary | ICD-10-CM

## 2014-01-14 DIAGNOSIS — G4700 Insomnia, unspecified: Secondary | ICD-10-CM | POA: Insufficient documentation

## 2014-01-14 DIAGNOSIS — E1142 Type 2 diabetes mellitus with diabetic polyneuropathy: Secondary | ICD-10-CM

## 2014-01-14 DIAGNOSIS — E039 Hypothyroidism, unspecified: Secondary | ICD-10-CM

## 2014-01-14 DIAGNOSIS — N183 Chronic kidney disease, stage 3 unspecified: Secondary | ICD-10-CM

## 2014-01-14 DIAGNOSIS — F17200 Nicotine dependence, unspecified, uncomplicated: Secondary | ICD-10-CM

## 2014-01-14 DIAGNOSIS — E1140 Type 2 diabetes mellitus with diabetic neuropathy, unspecified: Secondary | ICD-10-CM

## 2014-01-14 DIAGNOSIS — I10 Essential (primary) hypertension: Secondary | ICD-10-CM | POA: Diagnosis not present

## 2014-01-14 LAB — POCT UA - MICROALBUMIN
CREATININE, POC: 50 mg/dL
MICROALBUMIN (UR) POC: 10 mg/L

## 2014-01-14 LAB — POCT GLYCOSYLATED HEMOGLOBIN (HGB A1C): Hemoglobin A1C: 6.1

## 2014-01-14 MED ORDER — ZOLPIDEM TARTRATE 5 MG PO TABS
5.0000 mg | ORAL_TABLET | Freq: Every evening | ORAL | Status: DC | PRN
Start: 2014-01-14 — End: 2014-03-25

## 2014-01-14 NOTE — Progress Notes (Signed)
   Subjective:    Patient ID: [name], female    DOB: [birth year], 68 y.o.   MRN: [id number]  HPI Diabetes - no hypoglycemic events. No wounds or sores that are not healing well. No increased thirst or urination. Checking glucose at home. Taking medications as prescribed without any side effects.  Hypertension- Pt denies chest pain, SOB, dizziness, or heart palpitations.  Taking meds as directed w/o problems.  Denies medication side effects.  Has lost 6 lbs.  she says she's been trying to be more active and is excited about her weight loss.   CKD-3 -no change in urination. Lab Results  Component Value Date   CREATININE 0.98 10/14/2013   Hypothyroid - No skin or hair changes. No energy, very fatigued. No rcent changes. She takes her medication regularly without any side effects or problems.  Insomnia-her insurance will no longer cover Ambien but she is willing to pay cash for her. She would like an obstruction sent to pharmacy.  Review of Systems     Objective:   Physical Exam  Constitutional: She is oriented to person, place, and time. She appears well-developed and well-nourished.  HENT:  Head: Normocephalic and atraumatic.  Right Ear: External ear normal.  Left Ear: External ear normal.  Nose: Nose normal.  Mouth/Throat: Oropharynx is clear and moist.  TMs and canals are clear.   Eyes: Conjunctivae and EOM are normal. Pupils are equal, round, and reactive to light.  Neck: Neck supple. No thyromegaly present.  Cardiovascular: Normal rate, regular rhythm and normal heart sounds.   Pulmonary/Chest: Effort normal and breath sounds normal. She has no wheezes.  Lymphadenopathy:    She has no cervical adenopathy.  Neurological: She is alert and oriented to person, place, and time.  Skin: Skin is warm and dry.  Psychiatric: She has a normal mood and affect.          Assessment & Plan:  DM- well controlled.  A1C is 6.1 today.  Her A1c is down from 6.5 which is fantastic.  We'll do a urine microalbumin today. Continue current regimen followup in 3-4 months.  HTN- Well controlled on current regimen. Congratulated her on increasing activity and weight loss.  CKD-3 - follow every 6 mo.  will be due for repeat in 3 months.  Hypothyroid - due to recheck TSH.  Will call with lab results 3 May need to adjust her medication again.  tob abuse - encourage smoking cessation.  Due for shingles vaccine. H.O provided.    Insomnia - Ambien works really well for her but insurance won't pay for it.  She would like to pay cash for it. She understand potential risk of medication in elderly.

## 2014-01-15 ENCOUNTER — Other Ambulatory Visit: Payer: Self-pay | Admitting: Family Medicine

## 2014-01-15 LAB — TSH: TSH: 0.067 u[IU]/mL — AB (ref 0.350–4.500)

## 2014-01-19 ENCOUNTER — Other Ambulatory Visit: Payer: Self-pay | Admitting: Family Medicine

## 2014-01-19 MED ORDER — LEVOTHYROXINE SODIUM 100 MCG PO TABS: ORAL_TABLET | ORAL | Status: DC

## 2014-01-21 ENCOUNTER — Other Ambulatory Visit: Payer: Self-pay | Admitting: Family Medicine

## 2014-01-26 DIAGNOSIS — M5380 Other specified dorsopathies, site unspecified: Secondary | ICD-10-CM | POA: Diagnosis not present

## 2014-01-26 DIAGNOSIS — M47817 Spondylosis without myelopathy or radiculopathy, lumbosacral region: Secondary | ICD-10-CM | POA: Diagnosis not present

## 2014-01-26 DIAGNOSIS — M5137 Other intervertebral disc degeneration, lumbosacral region: Secondary | ICD-10-CM | POA: Diagnosis not present

## 2014-01-26 DIAGNOSIS — Z79899 Other long term (current) drug therapy: Secondary | ICD-10-CM | POA: Diagnosis not present

## 2014-01-28 ENCOUNTER — Other Ambulatory Visit: Payer: Self-pay | Admitting: Family Medicine

## 2014-02-04 DIAGNOSIS — M47817 Spondylosis without myelopathy or radiculopathy, lumbosacral region: Secondary | ICD-10-CM | POA: Diagnosis not present

## 2014-02-18 ENCOUNTER — Other Ambulatory Visit: Payer: Self-pay | Admitting: Family Medicine

## 2014-02-19 ENCOUNTER — Telehealth: Payer: Self-pay | Admitting: *Deleted

## 2014-02-19 DIAGNOSIS — S8990XA Unspecified injury of unspecified lower leg, initial encounter: Secondary | ICD-10-CM | POA: Diagnosis not present

## 2014-02-19 DIAGNOSIS — Z96659 Presence of unspecified artificial knee joint: Secondary | ICD-10-CM | POA: Diagnosis not present

## 2014-02-19 DIAGNOSIS — I10 Essential (primary) hypertension: Secondary | ICD-10-CM | POA: Diagnosis not present

## 2014-02-19 DIAGNOSIS — E785 Hyperlipidemia, unspecified: Secondary | ICD-10-CM | POA: Diagnosis not present

## 2014-02-19 DIAGNOSIS — S92309A Fracture of unspecified metatarsal bone(s), unspecified foot, initial encounter for closed fracture: Secondary | ICD-10-CM | POA: Diagnosis not present

## 2014-02-19 DIAGNOSIS — Z888 Allergy status to other drugs, medicaments and biological substances status: Secondary | ICD-10-CM | POA: Diagnosis not present

## 2014-02-19 DIAGNOSIS — IMO0002 Reserved for concepts with insufficient information to code with codable children: Secondary | ICD-10-CM | POA: Diagnosis not present

## 2014-02-19 DIAGNOSIS — E079 Disorder of thyroid, unspecified: Secondary | ICD-10-CM | POA: Diagnosis not present

## 2014-02-19 DIAGNOSIS — Z79899 Other long term (current) drug therapy: Secondary | ICD-10-CM | POA: Diagnosis not present

## 2014-02-19 DIAGNOSIS — E119 Type 2 diabetes mellitus without complications: Secondary | ICD-10-CM | POA: Diagnosis not present

## 2014-02-19 DIAGNOSIS — S99919A Unspecified injury of unspecified ankle, initial encounter: Secondary | ICD-10-CM | POA: Diagnosis not present

## 2014-02-19 DIAGNOSIS — Z87891 Personal history of nicotine dependence: Secondary | ICD-10-CM | POA: Diagnosis not present

## 2014-02-19 DIAGNOSIS — Z9089 Acquired absence of other organs: Secondary | ICD-10-CM | POA: Diagnosis not present

## 2014-02-19 DIAGNOSIS — Z7982 Long term (current) use of aspirin: Secondary | ICD-10-CM | POA: Diagnosis not present

## 2014-02-19 DIAGNOSIS — M519 Unspecified thoracic, thoracolumbar and lumbosacral intervertebral disc disorder: Secondary | ICD-10-CM | POA: Diagnosis not present

## 2014-02-19 DIAGNOSIS — G589 Mononeuropathy, unspecified: Secondary | ICD-10-CM | POA: Diagnosis not present

## 2014-02-19 MED ORDER — AMBULATORY NON FORMULARY MEDICATION: Status: DC

## 2014-02-19 NOTE — Telephone Encounter (Signed)
Pt called and stated that her Rollator broke and that Medicare will pay for her to get another one if Dr. Madilyn Fireman writes a Rx for her to get another one.Elouise Munroe Informed pt that order has been sent.Audelia Hives [place]

## 2014-02-20 ENCOUNTER — Other Ambulatory Visit: Payer: Self-pay | Admitting: Family Medicine

## 2014-02-23 DIAGNOSIS — S92309A Fracture of unspecified metatarsal bone(s), unspecified foot, initial encounter for closed fracture: Secondary | ICD-10-CM | POA: Diagnosis not present

## 2014-02-23 DIAGNOSIS — M79609 Pain in unspecified limb: Secondary | ICD-10-CM | POA: Diagnosis not present

## 2014-02-24 ENCOUNTER — Other Ambulatory Visit: Payer: Self-pay | Admitting: Family Medicine

## 2014-03-02 ENCOUNTER — Other Ambulatory Visit: Payer: Self-pay | Admitting: Family Medicine

## 2014-03-03 ENCOUNTER — Encounter: Payer: Self-pay | Admitting: Physician Assistant

## 2014-03-03 ENCOUNTER — Ambulatory Visit (INDEPENDENT_AMBULATORY_CARE_PROVIDER_SITE_OTHER): Payer: Medicare Other | Admitting: Physician Assistant

## 2014-03-03 VITALS — BP 116/71 | HR 58 | Ht 67.0 in | Wt 198.0 lb

## 2014-03-03 DIAGNOSIS — L0390 Cellulitis, unspecified: Secondary | ICD-10-CM | POA: Diagnosis not present

## 2014-03-03 DIAGNOSIS — L0291 Cutaneous abscess, unspecified: Secondary | ICD-10-CM | POA: Diagnosis not present

## 2014-03-03 MED ORDER — SULFAMETHOXAZOLE-TRIMETHOPRIM 800-160 MG PO TABS: 1.0000 | ORAL_TABLET | Freq: Two times a day (BID) | ORAL | Status: DC

## 2014-03-03 NOTE — Progress Notes (Signed)
   Subjective:    Patient ID: [name], female    DOB: 03-Jun-1946, 68 y.o.   MRN: [id number]  HPI Pt a 68 yo female who presents to the clinic with a mass on her left vaginal area for last week. Very painful. No draining. Never had anything like this before. Place warm compresses and seemed to make bigger but pain has worsen. Pt denies any fever, chills, nausea, vomiting, diarrhea.      Review of Systems  All other systems reviewed and are negative.      Objective:   Physical Exam  Genitourinary:             Assessment & Plan:  Abscess and cellulitis- Incision and Drainage Procedure Note  Pre-operative Diagnosis: abscess with cellulitis  Post-operative Diagnosis: same  Indications: pain/infection  Anesthesia: 1% plain lidocaine  Procedure Details  The procedure, risks and complications have been discussed in detail (including, but not limited to airway compromise, infection, bleeding) with the patient, and the patient has signed consent to the procedure.  The skin was sterilely prepped and draped over the affected area in the usual fashion. After adequate local anesthesia, I&D with a #11 blade was performed on the left, labia majora . Purulent drainage: present The patient was observed until stable.  Findings: Infected abscess  EBL: 2 cc's  Drains: placed packing from removal on Friday.   Condition: Tolerated procedure well   Complications: pain  Pt given bactrim to start for 10 days. Given care instructions. Bath after 24 hours with dial soap or hibiclens. Continue warm compresses and keep covered as draining. Follow up on Friday for recheck. Warning signs of infection given. Call with any red flags.   Pt has pain medcine from ongoing fracture that is being managed.

## 2014-03-03 NOTE — Patient Instructions (Addendum)
Dial soap/Hibiclens.   Abscess An abscess is an infected area that contains a collection of pus and debris.It can occur in almost any part of the body. An abscess is also known as a furuncle or boil. CAUSES  An abscess occurs when tissue gets infected. This can occur from blockage of oil or sweat glands, infection of hair follicles, or a minor injury to the skin. As the body tries to fight the infection, pus collects in the area and creates pressure under the skin. This pressure causes pain. People with weakened immune systems have difficulty fighting infections and get certain abscesses more often.  SYMPTOMS Usually an abscess develops on the skin and becomes a painful mass that is red, warm, and tender. If the abscess forms under the skin, you may feel a moveable soft area under the skin. Some abscesses break open (rupture) on their own, but most will continue to get worse without care. The infection can spread deeper into the body and eventually into the bloodstream, causing you to feel ill.  DIAGNOSIS  Your caregiver will take your medical history and perform a physical exam. A sample of fluid may also be taken from the abscess to determine what is causing your infection. TREATMENT  Your caregiver may prescribe antibiotic medicines to fight the infection. However, taking antibiotics alone usually does not cure an abscess. Your caregiver may need to make a small cut (incision) in the abscess to drain the pus. In some cases, gauze is packed into the abscess to reduce pain and to continue draining the area. HOME CARE INSTRUCTIONS   Only take over-the-counter or prescription medicines for pain, discomfort, or fever as directed by your caregiver.  If you were prescribed antibiotics, take them as directed. Finish them even if you start to feel better.  If gauze is used, follow your caregiver's directions for changing the gauze.  To avoid spreading the infection:  Keep your draining abscess  covered with a bandage.  Wash your hands well.  Do not share personal care items, towels, or whirlpools with others.  Avoid skin contact with others.  Keep your skin and clothes clean around the abscess.  Keep all follow-up appointments as directed by your caregiver. SEEK MEDICAL CARE IF:   You have increased pain, swelling, redness, fluid drainage, or bleeding.  You have muscle aches, chills, or a general ill feeling.  You have a fever. MAKE SURE YOU:   Understand these instructions.  Will watch your condition.  Will get help right away if you are not doing well or get worse. Document Released: 05/03/2005 Document Revised: 01/23/2012 Document Reviewed: 10/06/2011 [facility] Patient Information 2015 [place], [place]. This information is not intended to replace advice given to you by your health care provider. Make sure you discuss any questions you have with your health care provider.

## 2014-03-06 ENCOUNTER — Ambulatory Visit (INDEPENDENT_AMBULATORY_CARE_PROVIDER_SITE_OTHER): Payer: Medicare Other | Admitting: Physician Assistant

## 2014-03-06 ENCOUNTER — Encounter: Payer: Self-pay | Admitting: Physician Assistant

## 2014-03-06 VITALS — BP 88/59 | HR 65 | Ht 67.0 in | Wt 198.0 lb

## 2014-03-06 DIAGNOSIS — L0291 Cutaneous abscess, unspecified: Secondary | ICD-10-CM

## 2014-03-06 DIAGNOSIS — R031 Nonspecific low blood-pressure reading: Secondary | ICD-10-CM | POA: Diagnosis not present

## 2014-03-06 DIAGNOSIS — L0390 Cellulitis, unspecified: Principal | ICD-10-CM

## 2014-03-06 MED ORDER — NYSTATIN 100000 UNIT/ML MT SUSP: 5.0000 mL | Freq: Four times a day (QID) | OROMUCOSAL | Status: DC

## 2014-03-06 NOTE — Patient Instructions (Addendum)
Stay on 1/2 norvasc.  Start 1/2 tablet of metoprolol twice a day.  Keep checking BP if staying below 100 please make follow up.

## 2014-03-06 NOTE — Progress Notes (Signed)
   Subjective:    Patient ID: [name], female    DOB: 1946-02-11, 68 y.o.   MRN: [id number]  HPI Pt is a 68 yo female who presents to the clinic to follow up on vaginal abscess that was I and D, 2 days ago. She is feeling much better today. No pain. Continues to drain. No fever, chills, n/v/d. Continues to stay on bactrim. She usually gets thrush after abx. She would like mouth wash.   She has really low blood pressure today. Denies any fatigue or dizziness. She is on daily pain medication from ortho from fracture. No fever, chill,  n/v/d.    Review of Systems  All other systems reviewed and are negative.      Objective:   Physical Exam  Constitutional: She is oriented to person, place, and time. She appears well-developed and well-nourished.  HENT:  Head: Normocephalic and atraumatic.  Cardiovascular: Normal rate, regular rhythm and normal heart sounds.   Pulmonary/Chest: Effort normal and breath sounds normal.  Genitourinary:     Neurological: She is alert and oriented to person, place, and time.  Psychiatric: She has a normal mood and affect. Her behavior is normal.          Assessment & Plan:  Abscess and cellulitis- sent nystatin for thrush to use as needed. Finish bactrim. Continue to use warm compresses and keep covered while draining. Use dial soap to clean. Follow up as needed.   Low blood pressure reading- unclear why. Could be due to taking pain medication regularly. Cut metroprol in halft 50mg  bid. Follow up in 4 weeks. Keep a watch on BP. If not getting above 100/60 then call office. If going above 140/90 then go back to previous dose.

## 2014-03-10 ENCOUNTER — Emergency Department (INDEPENDENT_AMBULATORY_CARE_PROVIDER_SITE_OTHER)
Admission: EM | Admit: 2014-03-10 | Discharge: 2014-03-10 | Disposition: A | Discharge status: Home / Self Care | Payer: Medicare Other | Source: Home / Self Care | Attending: Family Medicine | Admitting: Family Medicine

## 2014-03-10 ENCOUNTER — Telehealth: Payer: Self-pay | Admitting: *Deleted

## 2014-03-10 ENCOUNTER — Encounter: Payer: Self-pay | Admitting: Emergency Medicine

## 2014-03-10 DIAGNOSIS — R259 Unspecified abnormal involuntary movements: Secondary | ICD-10-CM | POA: Diagnosis not present

## 2014-03-10 DIAGNOSIS — R251 Tremor, unspecified: Secondary | ICD-10-CM

## 2014-03-10 DIAGNOSIS — R5381 Other malaise: Secondary | ICD-10-CM | POA: Diagnosis not present

## 2014-03-10 DIAGNOSIS — R031 Nonspecific low blood-pressure reading: Secondary | ICD-10-CM | POA: Diagnosis not present

## 2014-03-10 DIAGNOSIS — I959 Hypotension, unspecified: Secondary | ICD-10-CM

## 2014-03-10 DIAGNOSIS — R5383 Other fatigue: Secondary | ICD-10-CM

## 2014-03-10 DIAGNOSIS — R531 Weakness: Secondary | ICD-10-CM

## 2014-03-10 LAB — POCT CBC W AUTO DIFF (K'VILLE URGENT CARE)

## 2014-03-10 LAB — COMPLETE METABOLIC PANEL WITH GFR
ALBUMIN: 3.2 g/dL — AB (ref 3.5–5.2)
ALK PHOS: 56 U/L (ref 39–117)
ALT: 15 U/L (ref 0–35)
AST: 16 U/L (ref 0–37)
BILIRUBIN TOTAL: 0.2 mg/dL (ref 0.2–1.2)
BUN: 15 mg/dL (ref 6–23)
CO2: 24 mEq/L (ref 19–32)
CREATININE: 1.45 mg/dL — AB (ref 0.50–1.10)
Calcium: 8.1 mg/dL — ABNORMAL LOW (ref 8.4–10.5)
Chloride: 110 mEq/L (ref 96–112)
GFR, Est African American: 42 mL/min — ABNORMAL LOW
GFR, Est Non African American: 37 mL/min — ABNORMAL LOW
Glucose, Bld: 87 mg/dL (ref 70–99)
POTASSIUM: 5.1 meq/L (ref 3.5–5.3)
Sodium: 144 mEq/L (ref 135–145)
Total Protein: 5.3 g/dL — ABNORMAL LOW (ref 6.0–8.3)

## 2014-03-10 MED ORDER — SODIUM CHLORIDE 0.9 % IV SOLN
Freq: Once | INTRAVENOUS | Status: AC
  Administered 2014-03-10: 13:00:00 via INTRAVENOUS

## 2014-03-10 MED ORDER — SODIUM CHLORIDE 0.9 % IV SOLN
INTRAVENOUS | Status: DC
  Administered 2014-03-10: 13:00:00 via INTRAVENOUS

## 2014-03-10 MED ORDER — SODIUM CHLORIDE 0.9 % IV SOLN
Freq: Once | INTRAVENOUS | Status: AC
  Administered 2014-03-10: 15:00:00 via INTRAVENOUS

## 2014-03-10 NOTE — Telephone Encounter (Signed)
Agree with Iran Planas, PA-C, discontinue Norvasc, metoprolol, also discontinue furosemide and ACE inhibitors. With a blood pressure that low, she needs to be seen immediately, I discussed this with the patient on the phone and she came to urgent care where IV fluids were given and her symptoms improved.

## 2014-03-10 NOTE — ED Notes (Signed)
[name] complains of weakness and shakiness for 1 day. She reports her blood pressure is very low at home, about 70/60. Denies vomiting, nausea, fevers, chills, sweats or diarrhea.

## 2014-03-10 NOTE — ED Provider Notes (Signed)
[name]  68 y.o. female continuation urgent care note.  Patient was managed by PA Iran Planas until 15:00 on 03/10/14.  Upon my care the patient was feeling much better after a 3 L NS fluid rehydration. Her CBC showed a white blood cell count is 5 hemoglobin of 12 and platelet count of 189.  Her EKG was not significantly changed from prior EKGs.  She denies any chest pains palpitations or shortness of breath. She denies any current weakness or numbness. She adamantly does not want to go to the emergency room.   Past Medical History  Diagnosis Date  . Hypertension   . Hyperlipidemia   . PUD (peptic ulcer disease)   . Charcot-Marie-Tooth disease   . Arthritis   . Neurogenic bladder   . Diabetic peripheral neuropathy   . Osteopenia   . Post-menopausal   . Insomnia   . COPD (chronic obstructive pulmonary disease)   . DDD (degenerative disc disease)     Lumbar and lumbosacral  . Tobacco dependence   . Thyroid disease     hypo  . Gastric ulcer     w/o hemorrhage  . Diabetes mellitus     type 2  . Depression   . Obesity   . Unspecified hereditary and idiopathic peripheral neuropathy   . Lumbosacral root lesions, not elsewhere classified   . Other symptoms referable to back   . Thoracic spondylosis without myelopathy   . Facet syndrome, lumbar   . Spinal stenosis, lumbar region, without neurogenic claudication    History  Substance Use Topics  . Smoking status: Current Every Day Smoker -- 1.00 packs/day for 49 years    Types: Cigarettes  . Smokeless tobacco: Never Used     Comment: pt states she is going to try the E-cig  . Alcohol Use: No   ROS as above Medications: Current Facility-Administered Medications  Medication Dose Route Frequency Provider Last Rate Last Dose  . 0.9 %  sodium chloride infusion   Intravenous Continuous Gregor Hams, MD       Current Outpatient Prescriptions  Medication Sig Dispense Refill  . alendronate (FOSAMAX) 70 MG tablet TAKE 1 TABLET  WEEKLY WITH A FULL GLASS OF WATER AND ON AN EMPTY STOMACH  4 tablet  0  . AMBULATORY NON FORMULARY MEDICATION Medication Name: Rollator Walker with seat. Fax to [phone]  1 Units  0  . amLODipine (NORVASC) 5 MG tablet TAKE 1/2 TABLET ONCE DAILY  15 tablet  3  . aspirin EC 81 MG tablet Take 81 mg by mouth daily.      . benazepril (LOTENSIN) 20 MG tablet TAKE 1 TABLET DAILY.  30 tablet  2  . clopidogrel (PLAVIX) 75 MG tablet TAKE 1 TABLET DAILY  30 tablet  2  . dicyclomine (BENTYL) 20 MG tablet Take 1 tablet (20 mg total) by mouth 3 (three) times daily as needed for spasms. And diarrhea  90 tablet  1  . furosemide (LASIX) 40 MG tablet TAKE 1 OR 2 TABLETS EVERY MORNING.  45 tablet  2  . gabapentin (NEURONTIN) 400 MG capsule TAKE ONE CAPSULE THREE TIMES DAILY AS NEEDED  90 capsule  0  . glipiZIDE (GLUCOTROL) 5 MG tablet TAKE ONE TABLET BEFORE BREAKFAST  30 tablet  0  . glucose blood test strip 1 each as needed. Use as instructed       . HYDROcodone-acetaminophen (NORCO/VICODIN) 5-325 MG per tablet Take 1 tablet by mouth every 6 (six) hours as  needed for moderate pain.      . hydrocortisone 2.5 % cream Apply topically daily as needed. X 7 days.  30 g  1  . Lancets MISC by Does not apply route.        Marland Kitchen LANTUS SOLOSTAR 100 UNIT/ML Solostar Pen INJECT 48 UNITS INTO THE SKIN DAILY  15 mL  0  . levothyroxine (SYNTHROID, LEVOTHROID) 100 MCG tablet TAKE ONE TABLET DAILY BEFORE BREAKFAST ON AN EMPTY STOMACH.  30 tablet  0  . meclizine (ANTIVERT) 50 MG tablet Take 0.5-1 tablets (25-50 mg total) by mouth 3 (three) times daily as needed for dizziness.  60 tablet  0  . metFORMIN (GLUCOPHAGE) 500 MG tablet Take 3 tablets (1500 mg) once a day  90 tablet  2  . metoprolol (LOPRESSOR) 100 MG tablet TAKE ONE TABLET TWICE DAILY  60 tablet  0  . nitroGLYCERIN (NITROSTAT) 0.4 MG SL tablet Place 1 tablet (0.4 mg total) under the tongue every 5 (five) minutes as needed.  10 tablet  1  . nystatin (MYCOSTATIN) 100000  UNIT/ML suspension Take 5 mLs (500,000 Units total) by mouth 4 (four) times daily.  120 mL  0  . simvastatin (ZOCOR) 40 MG tablet TAKE 1 AT BEDTIME  90 tablet  0  . sulfamethoxazole-trimethoprim (BACTRIM DS,SEPTRA DS) 800-160 MG per tablet Take 1 tablet by mouth 2 (two) times daily. For 10 days.  20 tablet  0  . VENTOLIN HFA 108 (90 BASE) MCG/ACT inhaler Inhale 2 puffs into the lungs every 6 (six) hours as needed for wheezi ng.  18 each  3  . zolpidem (AMBIEN) 5 MG tablet Take 1 tablet (5 mg total) by mouth at bedtime as needed for sleep.  30 tablet  2    Exam:  BP 107/70  Pulse 73  Temp(Src) 97.6 F (36.4 C) (Oral)  Ht 5\' 7"  (1.702 m)  Wt 198 lb (89.812 kg)  BMI 31.00 kg/m2  SpO2 99% Gen: Well NAD HEENT: EOMI,  MMM Lungs: Normal work of breathing. CTABL Heart: RRR no MRG (to my auscultation) Abd: NABS, Soft. Nondistended, Nontender Exts: Brisk capillary refill, warm and well perfused.   Twelve-lead EKG shows sinus bradycardia at 51 beats per minute. Flattened T waves. No ST segment elevation or depression. No Q waves. Not significantly changed from prior EKGs.  Results for orders placed during the hospital encounter of 03/10/14 (from the past 24 hour(s))  POCT CBC W AUTO DIFF ([facility])     Status: None   Collection Time    03/10/14  3:01 PM      Result Value Ref Range   WBC    4.5 - 10.5 K/uL   Lymphocytes relative %    15 - 45 %   Monocytes relative %    2 - 10 %   Neutrophils relative % (GR)    44 - 76 %   Lymphocytes absolute    0.1 - 1.8 K/uL   Monocyes absolute    0.1 - 1 K/uL   Neutrophils absolute (GR#)    1.7 - 7.8 K/uL   RBC    3.8 - 5.1 MIL/uL   Hemoglobin    11.8 - 15.5 g/dL   Hematocrit    34.8 - 46 %   MCV    78 - 100 fL   MCH    26 - 32 pg   MCHC    32 - 36.5 g/dL   RDW    11.6 -  14 %   Platelet count    140 - 400 K/uL   MPV    7.8 - 11 fL   No results found.  Assessment and Plan: 68 y.o. female with hypotension. I think patient had  excessive blood pressure medications resulted in her hypotension episode. Possibly they were more effective because of her recent Bactrim. I've advised patient to discontinue all that her enalapril. A metabolic panel is pending and will be available much later this evening. If any significant hours will call patient and advised her to the emergency room. She'll followup with her primary care provider on Friday.  Lengthy discussion regarding risks and benefits. Patient expresses understanding.  Discussed warning signs or symptoms. Please see discharge instructions.  This note was created using Systems analyst. Any transcription errors are unintended.    Gregor Hams, MD 03/10/14 [phone]

## 2014-03-10 NOTE — ED Provider Notes (Signed)
CSN: [id number]     Arrival date & time 03/10/14  1225 History   None    Chief Complaint  Patient presents with  . Weakness  . Shaking   (Consider location/radiation/quality/duration/timing/severity/associated sxs/prior Treatment) HPI  Pt presents to the clinic with BP reading of 67/47 at home. She did take benzapril, lasix, metoprolol 1/2 tab and norvasc 1/2 tablet this morning. She was seen last week for abscess and cellulitis of labia. She was treated with bactrim that she is still currently taking. Her does not report any pain, tenderness or draining of labia. She does have a healing fracture of left lower leg that occur almost 2 weeks ago and managed by orthopedics. She comes in with one day of shakiness and weakness. Denies any fever, chills, SOB, sinus pressure, ear pain. She does have an ongoing dry cough but not worsened today.   Past Medical History  Diagnosis Date  . Hypertension   . Hyperlipidemia   . PUD (peptic ulcer disease)   . Charcot-Marie-Tooth disease   . Arthritis   . Neurogenic bladder   . Diabetic peripheral neuropathy   . Osteopenia   . Post-menopausal   . Insomnia   . COPD (chronic obstructive pulmonary disease)   . DDD (degenerative disc disease)     Lumbar and lumbosacral  . Tobacco dependence   . Thyroid disease     hypo  . Gastric ulcer     w/o hemorrhage  . Diabetes mellitus     type 2  . Depression   . Obesity   . Unspecified hereditary and idiopathic peripheral neuropathy   . Lumbosacral root lesions, not elsewhere classified   . Other symptoms referable to back   . Thoracic spondylosis without myelopathy   . Facet syndrome, lumbar   . Spinal stenosis, lumbar region, without neurogenic claudication    Past Surgical History  Procedure Laterality Date  . Cholecystectomy    . Replacement total knee    . Fluoroscopic l5 dorsal medial branch bloc    . Bilateral l3 medial branch block    . Fibroid tumor removal    . Renal artery stent   11/28/2010    left  . Ankle reconstruction      left ankle, plate and pins    Family History  Problem Relation Age of Onset  . Stroke Mother   . Heart disease Father 17    heart attack  . Hypertension Father   . Cancer Father     lung  . Diabetes Paternal Aunt     insulin dependent   History  Substance Use Topics  . Smoking status: Current Every Day Smoker -- 1.00 packs/day for 49 years    Types: Cigarettes  . Smokeless tobacco: Never Used     Comment: pt states she is going to try the E-cig  . Alcohol Use: No   OB History   Grav Para Term Preterm Abortions TAB SAB Ect Mult Living                 Review of Systems  All other systems reviewed and are negative.   Allergies  Review of patient's allergies indicates no known allergies.  Home Medications   Prior to Admission medications   Medication Sig Start Date End Date Taking? Authorizing Provider  alendronate (FOSAMAX) 70 MG tablet TAKE 1 TABLET WEEKLY WITH A FULL GLASS OF WATER AND ON AN EMPTY STOMACH 01/21/14  Yes Hali Marry, MD  AMBULATORY NON FORMULARY MEDICATION  Medication Name: Rollator Walker with seat. Fax to [phone] 02/19/14  Yes Hali Marry, MD  amLODipine (NORVASC) 5 MG tablet TAKE 1/2 TABLET ONCE DAILY 12/10/13  Yes Hali Marry, MD  aspirin EC 81 MG tablet Take 81 mg by mouth daily.   Yes Historical Provider, MD  benazepril (LOTENSIN) 20 MG tablet TAKE 1 TABLET DAILY. 03/02/14  Yes Hali Marry, MD  clopidogrel (PLAVIX) 75 MG tablet TAKE 1 TABLET DAILY 03/02/14  Yes Hali Marry, MD  dicyclomine (BENTYL) 20 MG tablet Take 1 tablet (20 mg total) by mouth 3 (three) times daily as needed for spasms. And diarrhea 08/08/13  Yes Hali Marry, MD  furosemide (LASIX) 40 MG tablet TAKE 1 OR 2 TABLETS EVERY MORNING. 02/20/14  Yes Hali Marry, MD  gabapentin (NEURONTIN) 400 MG capsule TAKE ONE CAPSULE THREE TIMES DAILY AS NEEDED   Yes Hali Marry, MD   glipiZIDE (GLUCOTROL) 5 MG tablet TAKE ONE TABLET BEFORE BREAKFAST 02/24/14  Yes Hali Marry, MD  glucose blood test strip 1 each as needed. Use as instructed    Yes Historical Provider, MD  HYDROcodone-acetaminophen (NORCO/VICODIN) 5-325 MG per tablet Take 1 tablet by mouth every 6 (six) hours as needed for moderate pain.   Yes Historical Provider, MD  hydrocortisone 2.5 % cream Apply topically daily as needed. X 7 days. 10/14/13  Yes Hali Marry, MD  Lancets MISC by Does not apply route.     Yes Historical Provider, MD  LANTUS SOLOSTAR 100 UNIT/ML Solostar Pen INJECT 48 UNITS INTO THE SKIN DAILY   Yes Hali Marry, MD  levothyroxine (SYNTHROID, LEVOTHROID) 100 MCG tablet TAKE ONE TABLET DAILY BEFORE BREAKFAST ON AN EMPTY STOMACH. 02/18/14  Yes Hali Marry, MD  meclizine (ANTIVERT) 50 MG tablet Take 0.5-1 tablets (25-50 mg total) by mouth 3 (three) times daily as needed for dizziness. 08/12/12  Yes Hali Marry, MD  metFORMIN (GLUCOPHAGE) 500 MG tablet Take 3 tablets (1500 mg) once a day 01/28/14  Yes Hali Marry, MD  metoprolol (LOPRESSOR) 100 MG tablet TAKE ONE TABLET TWICE DAILY   Yes Hali Marry, MD  nitroGLYCERIN (NITROSTAT) 0.4 MG SL tablet Place 1 tablet (0.4 mg total) under the tongue every 5 (five) minutes as needed. 12/25/12  Yes Hali Marry, MD  nystatin (MYCOSTATIN) 100000 UNIT/ML suspension Take 5 mLs (500,000 Units total) by mouth 4 (four) times daily. 03/06/14  Yes Juana Haralson L Lolly Glaus, PA-C  simvastatin (ZOCOR) 40 MG tablet TAKE 1 AT BEDTIME   Yes Hali Marry, MD  sulfamethoxazole-trimethoprim (BACTRIM DS,SEPTRA DS) 800-160 MG per tablet Take 1 tablet by mouth 2 (two) times daily. For 10 days. 03/03/14  Yes Pantera Winterrowd L Laurine Kuyper, PA-C  VENTOLIN HFA 108 (90 BASE) MCG/ACT inhaler Inhale 2 puffs into the lungs every 6 (six) hours as needed for wheezi ng. 07/13/11  Yes Hali Marry, MD  zolpidem (AMBIEN) 5 MG tablet Take  1 tablet (5 mg total) by mouth at bedtime as needed for sleep. 01/14/14  Yes Hali Marry, MD   BP 89/61  Pulse 73  Temp(Src) 97.6 F (36.4 C) (Oral)  Ht 5\' 7"  (1.702 m)  Wt 198 lb (89.812 kg)  BMI 31.00 kg/m2  SpO2 99% Physical Exam  Constitutional: She is oriented to person, place, and time. No distress.  Appears pale and lethargic.  HENT:  Head: Normocephalic and atraumatic.  Right Ear: External ear normal.  Left Ear: External ear normal.  Nose: Nose normal.  Mouth/Throat: Oropharynx is clear and moist.  Eyes: Conjunctivae are normal. Right eye exhibits no discharge. Left eye exhibits no discharge.  Neck: Normal range of motion. Neck supple.  Cardiovascular: Normal rate, regular rhythm and normal heart sounds.   Pulmonary/Chest: Effort normal and breath sounds normal. She has no wheezes.  Abdominal: Soft. Bowel sounds are normal. She exhibits no distension and no mass. There is no tenderness. There is no rebound and no guarding.  Genitourinary:  Well-healed vaginal abscess with no draining, redness, erythema or swelling.   Musculoskeletal:  Knee cast on left leg.   Lymphadenopathy:    She has no cervical adenopathy.  Neurological: She is alert and oriented to person, place, and time.  Skin: Skin is dry. She is not diaphoretic.  Psychiatric: She has a normal mood and affect. Her behavior is normal.    ED Course  Procedures (including critical care time) Labs Review Labs Reviewed  COMPLETE METABOLIC PANEL WITH GFR  POCT CBC W AUTO DIFF ([facility])    Imaging Review No results found.   MDM   1. Low blood pressure reading   2. Shakiness   3. Hypotensive episode   4. Weak    CBC- WBC-5.0, HgB-12.1 CBC reassuring for no infection.  CMP ordered stat. 3 L of fluid given today. Pt is feeling better and alert.   Care was taken over by Dr. Alfonzo Beers.     Donella Stade, PA-C 03/10/14 1512  Donella Stade, PA-C 03/10/14 1519

## 2014-03-10 NOTE — Telephone Encounter (Signed)
Stop Norvasc and metoprolol. Follow up tomorrow. Make sure not running fever, chillls, nausea and vomiting. Tomorrow we will get CBC and blood cultures. Eat salt and drink fluids. Gatorade....etc.

## 2014-03-10 NOTE — Telephone Encounter (Signed)
Pt called today stating that she has been taking .5 tab of her metoprolol in the am & in the pm.  She states that she feels shaky and just wants to sleep all the time.  Her BP last night was 67/46 pulse 53.  I advised her to drink plenty of fluid for right now as well as to eat something salty.

## 2014-03-10 NOTE — Discharge Instructions (Signed)
Thank you for coming in today. I will call you with lab results tonight.  Please STOP Amlodipine and Metoprolol and Lasix.  Follow up with your doctors as directed.  If you get worse in [place] go to the emergency room.    Recheck on Friday at 2:30. With your doctor.   Hypotension As your heart beats, it forces blood through your arteries. This force is your blood pressure. If your blood pressure is too low for you to go about your normal activities or to support the organs of your body, you have hypotension. Hypotension is also referred to as low blood pressure. When your blood pressure becomes too low, you may not get enough blood to your brain. As a result, you may feel weak, feel lightheaded, or develop a rapid heart rate. In a more severe case, you may faint. CAUSES Various conditions can cause hypotension. These include:  Blood loss.  Dehydration.  Heart or endocrine problems.  Pregnancy.  Severe infection.  Not having a well-balanced diet filled with needed nutrients.  Severe allergic reactions (anaphylaxis). Some medicines, such as blood pressure medicine or water pills (diuretics), may lower your blood pressure below normal. Sometimes taking too much medicine or taking medicine not as directed can cause hypotension. TREATMENT  Hospitalization is sometimes required for hypotension if fluid or blood replacement is needed, if time is needed for medicines to wear off, or if further monitoring is needed. Treatment might include changing your diet, changing your medicines (including medicines aimed at raising your blood pressure), and use of support stockings. HOME CARE INSTRUCTIONS   Drink enough fluids to keep your urine clear or pale yellow.  Take your medicines as directed by your health care provider.  Get up slowly from reclining or sitting positions. This gives your blood pressure a chance to adjust.  Wear support stockings as directed by your health care  provider.  Maintain a healthy diet by including nutritious food, such as fruits, vegetables, nuts, whole grains, and lean meats. SEEK MEDICAL CARE IF:  You have vomiting or diarrhea.  You have a fever for more than 2-3 days.  You feel more thirsty than usual.  You feel weak and tired. SEEK IMMEDIATE MEDICAL CARE IF:   You have chest pain or a fast or irregular heartbeat.  You have a loss of feeling in some part of your body, or you lose movement in your arms or legs.  You have trouble speaking.  You become sweaty or feel lightheaded.  You faint. MAKE SURE YOU:   Understand these instructions.  Will watch your condition.  Will get help right away if you are not doing well or get worse. Document Released: 07/24/2005 Document Revised: 05/14/2013 Document Reviewed: 01/24/2013 [facility] Patient Information 2015 [address] [place], [place]. This information is not intended to replace advice given to you by your health care provider. Make sure you discuss any questions you have with your health care provider.

## 2014-03-10 NOTE — ED Notes (Signed)
Called patient with results of CMP.  Pt will f/u with PCP friday  Gregor Hams, MD 03/10/14 [phone]

## 2014-03-11 ENCOUNTER — Ambulatory Visit: Payer: Medicare Other | Admitting: Physician Assistant

## 2014-03-11 NOTE — ED Provider Notes (Signed)
Agree with exam, assessment, and plan.   Kandra Nicolas, MD 03/11/14 1409

## 2014-03-13 ENCOUNTER — Encounter: Payer: Self-pay | Admitting: Physician Assistant

## 2014-03-13 ENCOUNTER — Ambulatory Visit (INDEPENDENT_AMBULATORY_CARE_PROVIDER_SITE_OTHER): Payer: Medicare Other | Admitting: Physician Assistant

## 2014-03-13 VITALS — BP 136/90 | HR 109 | Ht 67.0 in | Wt 201.0 lb

## 2014-03-13 DIAGNOSIS — R000 Tachycardia, unspecified: Secondary | ICD-10-CM | POA: Diagnosis not present

## 2014-03-13 DIAGNOSIS — G252 Other specified forms of tremor: Secondary | ICD-10-CM

## 2014-03-13 DIAGNOSIS — G250 Essential tremor: Secondary | ICD-10-CM | POA: Diagnosis not present

## 2014-03-13 DIAGNOSIS — R0989 Other specified symptoms and signs involving the circulatory and respiratory systems: Secondary | ICD-10-CM | POA: Diagnosis not present

## 2014-03-13 DIAGNOSIS — R0609 Other forms of dyspnea: Secondary | ICD-10-CM | POA: Diagnosis not present

## 2014-03-13 DIAGNOSIS — I959 Hypotension, unspecified: Secondary | ICD-10-CM | POA: Diagnosis not present

## 2014-03-13 DIAGNOSIS — R0600 Dyspnea, unspecified: Secondary | ICD-10-CM

## 2014-03-13 NOTE — Patient Instructions (Signed)
Metoprolol 1/2 tablet twice a day.  Lotensin  Full tab daily.   Transient Ischemic Attack A transient ischemic attack (TIA) is a "warning stroke" that causes stroke-like symptoms. Unlike a stroke, a TIA does not cause permanent damage to the brain. The symptoms of a TIA can happen very fast and do not last long. It is important to know the symptoms of a TIA and what to do. This can help prevent a major stroke or death. CAUSES   A TIA is caused by a temporary blockage in an artery in the brain or neck (carotid artery). The blockage does not allow the brain to get the blood supply it needs and can cause different symptoms. The blockage can be caused by either:  A blood clot.  Fatty buildup (plaque) in a neck or brain artery. RISK FACTORS  High blood pressure (hypertension).  High cholesterol.  Diabetes mellitus.  Heart disease.  The build up of plaque in the blood vessels (peripheral artery disease or atherosclerosis).  The build up of plaque in the blood vessels providing blood and oxygen to the brain (carotid artery stenosis).  An abnormal heart rhythm (atrial fibrillation).  Obesity.  Smoking.  Taking oral contraceptives (especially in combination with smoking).  Physical inactivity.  A diet high in fats, salt (sodium), and calories.  Alcohol use.  Use of illegal drugs (especially cocaine and methamphetamine).  Being female.  Being African American.  Being over the age of 43.  Family history of stroke.  Previous history of blood clots, stroke, TIA, or heart attack.  Sickle cell disease. SYMPTOMS  TIA symptoms are the same as a stroke but are temporary. These symptoms usually develop suddenly, or may be newly present upon awakening from sleep:  Sudden weakness or numbness of the face, arm, or leg, especially on one side of the body.  Sudden trouble walking or difficulty moving arms or legs.  Sudden confusion.  Sudden personality changes.  Trouble speaking  (aphasia) or understanding.  Difficulty swallowing.  Sudden trouble seeing in one or both eyes.  Double vision.  Dizziness.  Loss of balance or coordination.  Sudden severe headache with no known cause.  Trouble reading or writing.  Loss of bowel or bladder control.  Loss of consciousness. DIAGNOSIS  Your caregiver may be able to determine the presence or absence of a TIA based on your symptoms, history, and physical exam. Computed tomography (CT scan) of the brain is usually performed to help identify a TIA. Other tests may be done to diagnose a TIA. These tests may include:  Electrocardiography.  Continuous heart monitoring.  Echocardiography.  Carotid ultrasonography.  Magnetic resonance imaging (MRI).  A scan of the brain circulation.  Blood tests. PREVENTION  The risk of a TIA can be decreased by appropriately treating high blood pressure, high cholesterol, diabetes, heart disease, and obesity and by quitting smoking, limiting alcohol, and staying physically active. TREATMENT  Time is of the essence. Since the symptoms of TIA are the same as a stroke, it is important to seek treatment as soon as possible because you may need a medicine to dissolve the clot (thrombolytic) that cannot be given if too much time has passed. Treatment options vary. Treatment options may include rest, oxygen, intravenous (IV) fluids, and medicines to thin the blood (anticoagulants). Medicines and diet may be used to address diabetes, high blood pressure, and other risk factors. Measures will be taken to prevent short-term and long-term complications, including infection from breathing foreign material into the  lungs (aspiration pneumonia), blood clots in the legs, and falls. Treatment options include procedures to either remove plaque in the carotid arteries or dilate carotid arteries that have narrowed due to plaque. Those procedures are:  Carotid endarterectomy.  Carotid angioplasty and  stenting. HOME CARE INSTRUCTIONS   Take all medicines prescribed by your caregiver. Follow the directions carefully. Medicines may be used to control risk factors for a stroke. Be sure you understand all your medicine instructions.  You may be told to take aspirin or the anticoagulant warfarin. Warfarin needs to be taken exactly as instructed.  Taking too much or too little warfarin is dangerous. Too much warfarin increases the risk of bleeding. Too little warfarin continues to allow the risk for blood clots. While taking warfarin, you will need to have regular blood tests to measure your blood clotting time. A PT blood test measures how long it takes for blood to clot. Your PT is used to calculate another value called an INR. Your PT and INR help your caregiver to adjust your dose of warfarin. The dose can change for many reasons. It is critically important that you take warfarin exactly as prescribed.  Many foods, especially foods high in vitamin K can interfere with warfarin and affect the PT and INR. Foods high in vitamin K include spinach, kale, broccoli, cabbage, collard and turnip greens, brussels sprouts, peas, cauliflower, seaweed, and parsley as well as beef and pork liver, green tea, and soybean oil. You should eat a consistent amount of foods high in vitamin K. Avoid major changes in your diet, or notify your caregiver before changing your diet. Arrange a visit with a dietitian to answer your questions.  Many medicines can interfere with warfarin and affect the PT and INR. You must tell your caregiver about any and all medicines you take, this includes all vitamins and supplements. Be especially cautious with aspirin and anti-inflammatory medicines. Do not take or discontinue any prescribed or over-the-counter medicine except on the advice of your caregiver or pharmacist.  Warfarin can have side effects, such as excessive bruising or bleeding. You will need to hold pressure over cuts for  longer than usual. Your caregiver or pharmacist will discuss other potential side effects.  Avoid sports or activities that may cause injury or bleeding.  Be mindful when shaving, flossing your teeth, or handling sharp objects.  Alcohol can change the body's ability to handle warfarin. It is best to avoid alcoholic drinks or consume only very small amounts while taking warfarin. Notify your caregiver if you change your alcohol intake.  Notify your dentist or other caregivers before procedures.  Eat a diet that includes 5 or more servings of fruits and vegetables each day. This may reduce the risk of stroke. Certain diets may be prescribed to address high blood pressure, high cholesterol, diabetes, or obesity.  A low-sodium, low-saturated fat, low-trans fat, low-cholesterol diet is recommended to manage high blood pressure.  A low-saturated fat, low-trans fat, low-cholesterol, and high-fiber diet may control cholesterol levels.  A controlled-carbohydrate, controlled-sugar diet is recommended to manage diabetes.  A reduced-calorie, low-sodium, low-saturated fat, low-trans fat, low-cholesterol diet is recommended to manage obesity.  Maintain a healthy weight.  Stay physically active. It is recommended that you get at least 30 minutes of activity on most or all days.  Do not smoke.  Limit alcohol use even if you are not taking warfarin. Moderate alcohol use is considered to be:  No more than 2 drinks each day for men.  No more than 1 drink each day for nonpregnant women.  Stop drug abuse.  Home safety. A safe home environment is important to reduce the risk of falls. Your caregiver may arrange for specialists to evaluate your home. Having grab bars in the bedroom and bathroom is often important. Your caregiver may arrange for equipment to be used at home, such as raised toilets and a seat for the shower.  Follow all instructions for follow-up with your caregiver. This is very  important. This includes any referrals and lab tests. Proper follow up can prevent a stroke or another TIA from occurring. SEEK MEDICAL CARE IF:  You have personality changes.  You have difficulty swallowing.  You are seeing double.  You have dizziness.  You have a fever.  You have skin breakdown. SEEK IMMEDIATE MEDICAL CARE IF:  Any of these symptoms may represent a serious problem that is an emergency. Do not wait to see if the symptoms will go away. Get medical help right away. Call your local emergency services (911 in [place] Do not drive yourself to the hospital.  You have sudden weakness or numbness of the face, arm, or leg, especially on one side of the body.  You have sudden trouble walking or difficulty moving arms or legs.  You have sudden confusion.  You have trouble speaking (aphasia) or understanding.  You have sudden trouble seeing in one or both eyes.  You have a loss of balance or coordination.  You have a sudden, severe headache with no known cause.  You have new chest pain or an irregular heartbeat.  You have a partial or total loss of consciousness. MAKE SURE YOU:   Understand these instructions.  Will watch your condition.  Will get help right away if you are not doing well or get worse. Document Released: 05/03/2005 Document Revised: 07/29/2013 Document Reviewed: 10/29/2013 [facility] Patient Information 2015 [place], [place]. This information is not intended to replace advice given to you by your health care provider. Make sure you discuss any questions you have with your health care provider.

## 2014-03-13 NOTE — Progress Notes (Signed)
   Subjective:    Patient ID: [name], female    DOB: August 16, 1945, 68 y.o.   MRN: [id number]  HPI Pt presents to the clinic to follow up after UC visit on 03/10/14. She actually saw myself in UC. She had reported BP of 60's over 50's. She had BP of 138/70 when she arrived;however after 1 bag of fluids BP was still in 80's over 60's. She recevied 2 more bags of fluids and discharged with BP 107over 80's. EKG sinus bradycardia that was unchanged from past EKG's. WBC wnl. No clear etiology was noted. Pt was taken off all BP medications except lotensin which she has continued. Taken BP at home and ranging 109-121 over 67-71. She has started to shake and has a slight tremor. She did not notice this before her BP dropping. She has much more strength and feeling better. No strength changes or extremity weakness. Concerned about TIA. She does have some SOB but no wheezing. Pt does smoke daily and not physically fit. She also is ambulating with walker due to left leg fracture.    Review of Systems  All other systems reviewed and are negative.      Objective:   Physical Exam  Constitutional: She is oriented to person, place, and time. She appears well-developed and well-nourished.  HENT:  Head: Normocephalic and atraumatic.  Cardiovascular: Regular rhythm and normal heart sounds.   Sinus tachycardia at 109.   Pulmonary/Chest: Effort normal and breath sounds normal.  Neurological: She is alert and oriented to person, place, and time. No cranial nerve deficit.  Strength of upper extremities 5/5.  Hand grip 5/5.  Tremor at rest and with active movement. Worse on left side. Stops with hand grip.   Pt using walker due to left leg fracture and being in cast to the knee.   Skin: Skin is dry.  Psychiatric: She has a normal mood and affect. Her behavior is normal.          Assessment & Plan:  Hypotensiondyspnea- BP's are much better and reassuring. Etiology unclear. Reassured pt that I do not  think was TIA. No strength changes or signs of stroke. I did give her HO of warning signs to look for. I did take a look at her LDL which was great, BP which was great. She was warned about smoking and diabetes control being important in lessing risk for future stroke. Pt is doing better but I would like to get an echo of heart to look for any valvular for BP dropping. Will order. metroprolol was restarted at half dose. Do not start any other medications for BP. Watch for swelling since off lasix as well as any  Worsening of difficultly breathing.    Tremor/tachycardia- i think being on BB could have been suppressing an essential tremor that she might have had. Will put back on 1/2 metoprolol twice a day and see if helps with tremor as well as control HR a little better. Pt is tachycardic today. Follow up with methney in 2 weeks for follow up. Continue to check BP and if dropping below 100/70 then please call office.

## 2014-03-17 ENCOUNTER — Other Ambulatory Visit: Payer: Self-pay | Admitting: *Deleted

## 2014-03-20 ENCOUNTER — Other Ambulatory Visit: Payer: Self-pay | Admitting: Family Medicine

## 2014-03-24 ENCOUNTER — Other Ambulatory Visit: Payer: Self-pay | Admitting: Family Medicine

## 2014-03-24 NOTE — Telephone Encounter (Signed)
Pt stated that Saturday when she got up she was really weak, chills, sweaty, temp has been 97.3 last night it was 98.5. Her BP has been 135/79. BS 70-80 in the mornings before breakfast she has been having diarrhea and belching and hasn't been eating a lot. She reports that she feels nauseated and has diarrhea. I suggested that she come by to p/u a C-diff kit so that her stool can be tested. Also advised that she continue to do proper hand hygiene and to continue to do bland diet. She was given Bactrim on 7/31 for a vaginal abscess. And she has also been out of her gabapentin since Sunday. Refill sent today. I informed pt that this could cause some of her sxs. However I would forward to Dr. Madilyn Fireman for advice.

## 2014-03-24 NOTE — Telephone Encounter (Signed)
Pt advised that she can try Imodium and advised that once she restarts her gabapentin she may begin to feel better however if not she should either go to UC or the ED other wise she really should f/u with Dr. Madilyn Fireman asap. She has an appt on 8/21. Maryruth Eve, [name]

## 2014-03-25 ENCOUNTER — Other Ambulatory Visit: Payer: Self-pay | Admitting: Family Medicine

## 2014-03-27 ENCOUNTER — Ambulatory Visit (INDEPENDENT_AMBULATORY_CARE_PROVIDER_SITE_OTHER): Payer: Medicare Other | Admitting: Family Medicine

## 2014-03-27 ENCOUNTER — Encounter: Payer: Self-pay | Admitting: Family Medicine

## 2014-03-27 VITALS — BP 162/91 | HR 71 | Wt 201.0 lb

## 2014-03-27 DIAGNOSIS — R0600 Dyspnea, unspecified: Secondary | ICD-10-CM

## 2014-03-27 DIAGNOSIS — I959 Hypotension, unspecified: Secondary | ICD-10-CM

## 2014-03-27 DIAGNOSIS — Z23 Encounter for immunization: Secondary | ICD-10-CM | POA: Diagnosis not present

## 2014-03-27 DIAGNOSIS — R000 Tachycardia, unspecified: Secondary | ICD-10-CM

## 2014-03-27 NOTE — Progress Notes (Signed)
   Subjective:    Patient ID: [name], female    DOB: 1945/09/15, 68 y.o.   MRN: [id number]  Hypertension   Hypotension - she is still taking half her metoprolol. She is holding her lasix.  Home BPs running 120-130s/70s.  No CP or SOB.  She was seen on 8/7 for hypotension. She was nauseated, having diarrhea, feeling dizzy and lightheaded and weak. All of that has resolved. The she still feels a little bit weak. She says looking back she wonders if it could have been related to missing 3 days of her gabapentin. She normally takes 4 mg 3 times a day and says she ran out of her medication and what without it for about 3 days. She said when she got her new prescription and started back she started feeling significantly better pretty quickly.  Review of Systems     Objective:   Physical Exam  Constitutional: She is oriented to person, place, and time. She appears well-developed and well-nourished.  HENT:  Head: Normocephalic and atraumatic.  Cardiovascular: Normal rate, regular rhythm and normal heart sounds.   Pulmonary/Chest: Effort normal and breath sounds normal.  Neurological: She is alert and oriented to person, place, and time.  Skin: Skin is warm and dry.  Psychiatric: She has a normal mood and affect. Her behavior is normal.          Assessment & Plan:  Hypertension-blood pressure is back up today. She is still taking a half a tab of the metoprolol. She has been eating a lot more salt to try to keep her blood pressure as I encouraged her to go back to her low-salt diet and keep a half a tab of metoprolol for now. I will actually see her back in about 3 weeks for diabetes so if her blood pressure ha and s been able to stay up then we may go back up to a whole tablet metoprolol.

## 2014-04-03 DIAGNOSIS — S92309A Fracture of unspecified metatarsal bone(s), unspecified foot, initial encounter for closed fracture: Secondary | ICD-10-CM | POA: Diagnosis not present

## 2014-04-07 DIAGNOSIS — I517 Cardiomegaly: Secondary | ICD-10-CM | POA: Diagnosis not present

## 2014-04-07 DIAGNOSIS — I959 Hypotension, unspecified: Secondary | ICD-10-CM | POA: Diagnosis not present

## 2014-04-07 DIAGNOSIS — I9589 Other hypotension: Secondary | ICD-10-CM | POA: Diagnosis not present

## 2014-04-08 ENCOUNTER — Encounter: Payer: Self-pay | Admitting: Family Medicine

## 2014-04-08 ENCOUNTER — Telehealth: Payer: Self-pay | Admitting: Family Medicine

## 2014-04-08 DIAGNOSIS — I5189 Other ill-defined heart diseases: Secondary | ICD-10-CM | POA: Insufficient documentation

## 2014-04-08 NOTE — Telephone Encounter (Signed)
Call patient: Echocardiogram shows some thickening of the left ventricle of the heart. This is usually secondary to high blood pressure so we need to make sure that we are controlling this well over the long run. The pumping function is working well. She also had a little but of mild abnormal relaxation of the heart but not worrisome.

## 2014-04-08 NOTE — Telephone Encounter (Signed)
Pt informed of results. She stated that her daughter has been keeping a check on her bp and wanted to know what would be to low of a blood  pressure. I told her if it falls below 90/60 and she feels dizzy/lightheaded, nauseated, blurred vision, or her skin gets clammy she should seek immediate medical attention if our office is not open. I also advised that she drink plenty of fluids which she stated she is already doing. She voiced understanding and agreed.[name] [place]

## 2014-04-22 ENCOUNTER — Other Ambulatory Visit: Payer: Self-pay | Admitting: Family Medicine

## 2014-04-23 ENCOUNTER — Other Ambulatory Visit: Payer: Self-pay | Admitting: Family Medicine

## 2014-04-27 ENCOUNTER — Ambulatory Visit (INDEPENDENT_AMBULATORY_CARE_PROVIDER_SITE_OTHER): Payer: Medicare Other | Admitting: Family Medicine

## 2014-04-27 ENCOUNTER — Encounter: Payer: Self-pay | Admitting: Family Medicine

## 2014-04-27 VITALS — BP 141/70 | HR 56 | Wt 197.0 lb

## 2014-04-27 DIAGNOSIS — E039 Hypothyroidism, unspecified: Secondary | ICD-10-CM | POA: Diagnosis not present

## 2014-04-27 DIAGNOSIS — I10 Essential (primary) hypertension: Secondary | ICD-10-CM | POA: Diagnosis not present

## 2014-04-27 DIAGNOSIS — E1140 Type 2 diabetes mellitus with diabetic neuropathy, unspecified: Secondary | ICD-10-CM

## 2014-04-27 DIAGNOSIS — Z1231 Encounter for screening mammogram for malignant neoplasm of breast: Secondary | ICD-10-CM

## 2014-04-27 DIAGNOSIS — Z23 Encounter for immunization: Secondary | ICD-10-CM | POA: Diagnosis not present

## 2014-04-27 DIAGNOSIS — E1142 Type 2 diabetes mellitus with diabetic polyneuropathy: Secondary | ICD-10-CM

## 2014-04-27 DIAGNOSIS — E1149 Type 2 diabetes mellitus with other diabetic neurological complication: Secondary | ICD-10-CM

## 2014-04-27 LAB — POCT GLYCOSYLATED HEMOGLOBIN (HGB A1C): Hemoglobin A1C: 5.7

## 2014-04-27 NOTE — Progress Notes (Signed)
   Subjective:    Patient ID: [name], female    DOB: 03/02/1946, 68 y.o.   MRN: [id number]  Diabetes   Diabetes - no hypoglycemic events. No wounds or sores that are not healing well. No increased thirst or urination. Checking glucose at home. Taking medications as prescribed without any side effects. She is using 48 units of Lantus.   Hypertension- Pt denies chest pain, SOB, dizziness, or heart palpitations.  Taking meds as directed w/o problems.  Denies medication side effects.  Home BPs  Running mostly in the 120-130s. Had 2 high reading. The highest was 165/80.  Pulse in 60-70s.   Hypogonadism-due to recheck TSH level. We adjusted her dose him is 3 months ago. No recent changes in energy level fatigue. No changes in skin or hair.  Review of Systems     Objective:   Physical Exam  Constitutional: She is oriented to person, place, and time. She appears well-developed and well-nourished.  HENT:  Head: Normocephalic and atraumatic.  Neck: Neck supple. No thyromegaly present.  Cardiovascular: Normal rate, regular rhythm and normal heart sounds.   Pulmonary/Chest: Effort normal and breath sounds normal.  Musculoskeletal: She exhibits no edema.  Lymphadenopathy:    She has no cervical adenopathy.  Neurological: She is alert and oriented to person, place, and time.  Skin: Skin is warm and dry.  Psychiatric: She has a normal mood and affect. Her behavior is normal.          Assessment & Plan:  Given Prevnar 13  Due for screening mammogram.

## 2014-04-27 NOTE — Assessment & Plan Note (Signed)
Home blood pressures over all very well-controlled. She only had 2 that were elevated. Today her blood pressure is a little borderline. But overall think is acceptable based on age and with her home blood pressures running fairly normal. We will recheck again today before she goes home.

## 2014-04-27 NOTE — Assessment & Plan Note (Signed)
A1c looks fantastic today at 5.7. Well controlled. Continue current regimen. Reminded that she is due for an eye exam. Followup in 3 months.

## 2014-04-27 NOTE — Assessment & Plan Note (Signed)
Due to recheck TSH level. She's been on her current dose for about 3 months. She's tolerating it well

## 2014-04-27 NOTE — Addendum Note (Signed)
Addended by: Teddy Spike on: 04/27/2014 11:39 AM   Modules accepted: Orders

## 2014-04-28 LAB — TSH: TSH: 2.093 u[IU]/mL (ref 0.350–4.500)

## 2014-05-01 ENCOUNTER — Other Ambulatory Visit: Payer: Self-pay | Admitting: Family Medicine

## 2014-05-13 ENCOUNTER — Ambulatory Visit (INDEPENDENT_AMBULATORY_CARE_PROVIDER_SITE_OTHER): Payer: Medicare Other

## 2014-05-13 DIAGNOSIS — Z1231 Encounter for screening mammogram for malignant neoplasm of breast: Secondary | ICD-10-CM | POA: Diagnosis not present

## 2014-05-22 ENCOUNTER — Other Ambulatory Visit: Payer: Self-pay | Admitting: Family Medicine

## 2014-05-22 DIAGNOSIS — Z79891 Long term (current) use of opiate analgesic: Secondary | ICD-10-CM | POA: Diagnosis not present

## 2014-05-22 DIAGNOSIS — M5136 Other intervertebral disc degeneration, lumbar region: Secondary | ICD-10-CM | POA: Diagnosis not present

## 2014-05-22 DIAGNOSIS — M47816 Spondylosis without myelopathy or radiculopathy, lumbar region: Secondary | ICD-10-CM | POA: Diagnosis not present

## 2014-05-22 DIAGNOSIS — G894 Chronic pain syndrome: Secondary | ICD-10-CM | POA: Diagnosis not present

## 2014-05-22 DIAGNOSIS — M62830 Muscle spasm of back: Secondary | ICD-10-CM | POA: Diagnosis not present

## 2014-05-25 ENCOUNTER — Other Ambulatory Visit: Payer: Self-pay | Admitting: Family Medicine

## 2014-05-26 ENCOUNTER — Other Ambulatory Visit: Payer: Self-pay | Admitting: Family Medicine

## 2014-05-27 ENCOUNTER — Telehealth: Payer: Self-pay | Admitting: *Deleted

## 2014-05-27 DIAGNOSIS — M4727 Other spondylosis with radiculopathy, lumbosacral region: Secondary | ICD-10-CM | POA: Diagnosis not present

## 2014-05-27 NOTE — Telephone Encounter (Signed)
Pt called and lvm stating that her daughters pcp advised that since she is positive for c-diff she will need to be tested also.  Called and informed pt that order will be placed and she can go to the lab to p/u specimen container.[name] [place]

## 2014-05-28 ENCOUNTER — Other Ambulatory Visit: Payer: Self-pay | Admitting: Family Medicine

## 2014-06-05 ENCOUNTER — Other Ambulatory Visit: Payer: Self-pay | Admitting: Family Medicine

## 2014-06-08 DIAGNOSIS — G5790 Unspecified mononeuropathy of unspecified lower limb: Secondary | ICD-10-CM | POA: Diagnosis not present

## 2014-06-08 DIAGNOSIS — S92309A Fracture of unspecified metatarsal bone(s), unspecified foot, initial encounter for closed fracture: Secondary | ICD-10-CM | POA: Diagnosis not present

## 2014-06-08 DIAGNOSIS — M79672 Pain in left foot: Secondary | ICD-10-CM | POA: Diagnosis not present

## 2014-06-18 ENCOUNTER — Other Ambulatory Visit: Payer: Self-pay | Admitting: Family Medicine

## 2014-06-23 ENCOUNTER — Other Ambulatory Visit: Payer: Self-pay | Admitting: Family Medicine

## 2014-06-25 ENCOUNTER — Other Ambulatory Visit: Payer: Self-pay | Admitting: Family Medicine

## 2014-06-26 LAB — HM DIABETES EYE EXAM

## 2014-07-03 ENCOUNTER — Other Ambulatory Visit: Payer: Self-pay | Admitting: Family Medicine

## 2014-07-17 DIAGNOSIS — M62830 Muscle spasm of back: Secondary | ICD-10-CM | POA: Diagnosis not present

## 2014-07-17 DIAGNOSIS — G894 Chronic pain syndrome: Secondary | ICD-10-CM | POA: Diagnosis not present

## 2014-07-17 DIAGNOSIS — Z79891 Long term (current) use of opiate analgesic: Secondary | ICD-10-CM | POA: Diagnosis not present

## 2014-07-17 DIAGNOSIS — M47816 Spondylosis without myelopathy or radiculopathy, lumbar region: Secondary | ICD-10-CM | POA: Diagnosis not present

## 2014-07-17 DIAGNOSIS — M5136 Other intervertebral disc degeneration, lumbar region: Secondary | ICD-10-CM | POA: Diagnosis not present

## 2014-07-22 ENCOUNTER — Ambulatory Visit (INDEPENDENT_AMBULATORY_CARE_PROVIDER_SITE_OTHER): Payer: Medicare Other | Admitting: Physician Assistant

## 2014-07-22 ENCOUNTER — Encounter: Payer: Self-pay | Admitting: Physician Assistant

## 2014-07-22 VITALS — BP 137/81 | HR 63 | Ht 67.0 in | Wt 201.0 lb

## 2014-07-22 DIAGNOSIS — G4700 Insomnia, unspecified: Secondary | ICD-10-CM

## 2014-07-22 DIAGNOSIS — K589 Irritable bowel syndrome without diarrhea: Secondary | ICD-10-CM | POA: Diagnosis not present

## 2014-07-22 DIAGNOSIS — R140 Abdominal distension (gaseous): Secondary | ICD-10-CM | POA: Diagnosis not present

## 2014-07-22 DIAGNOSIS — R5383 Other fatigue: Secondary | ICD-10-CM

## 2014-07-22 MED ORDER — PANTOPRAZOLE SODIUM 40 MG PO TBEC: 40.0000 mg | DELAYED_RELEASE_TABLET | Freq: Every day | ORAL | Status: DC

## 2014-07-22 MED ORDER — AMOXICILLIN-POT CLAVULANATE 875-125 MG PO TABS: 1.0000 | ORAL_TABLET | Freq: Two times a day (BID) | ORAL | Status: DC

## 2014-07-22 MED ORDER — DICYCLOMINE HCL 10 MG PO CAPS: 10.0000 mg | ORAL_CAPSULE | Freq: Two times a day (BID) | ORAL | Status: DC

## 2014-07-22 NOTE — Patient Instructions (Signed)
Melatonin 10mg  at bedtime.  Probiotic daily.  Bentyl at 2 largest meals.

## 2014-07-23 ENCOUNTER — Other Ambulatory Visit: Payer: Self-pay | Admitting: Family Medicine

## 2014-07-23 ENCOUNTER — Other Ambulatory Visit: Payer: Self-pay | Admitting: Physician Assistant

## 2014-07-23 LAB — CBC WITH DIFFERENTIAL/PLATELET
Basophils Absolute: 0 10*3/uL (ref 0.0–0.1)
Basophils Relative: 0 % (ref 0–1)
Eosinophils Absolute: 0.2 10*3/uL (ref 0.0–0.7)
Eosinophils Relative: 2 % (ref 0–5)
HCT: 44 % (ref 36.0–46.0)
Hemoglobin: 15 g/dL (ref 12.0–15.0)
Lymphocytes Relative: 25 % (ref 12–46)
Lymphs Abs: 2.2 10*3/uL (ref 0.7–4.0)
MCH: 32.5 pg (ref 26.0–34.0)
MCHC: 34.1 g/dL (ref 30.0–36.0)
MCV: 95.2 fL (ref 78.0–100.0)
MPV: 11.2 fL (ref 9.4–12.4)
Monocytes Absolute: 0.5 10*3/uL (ref 0.1–1.0)
Monocytes Relative: 6 % (ref 3–12)
Neutro Abs: 5.8 10*3/uL (ref 1.7–7.7)
Neutrophils Relative %: 67 % (ref 43–77)
Platelets: 259 10*3/uL (ref 150–400)
RBC: 4.62 MIL/uL (ref 3.87–5.11)
RDW: 13.8 % (ref 11.5–15.5)
WBC: 8.7 10*3/uL (ref 4.0–10.5)

## 2014-07-23 LAB — TSH: TSH: 0.799 u[IU]/mL (ref 0.350–4.500)

## 2014-07-24 NOTE — Progress Notes (Addendum)
   Subjective:    Patient ID: [name], female    DOB: Jun 27, 1946, 68 y.o.   MRN: [id number]  HPI Patient is a 68 year old female who presents to the clinic with chief complaint of fatigue and not sleeping well. Patient states for the last couple months she is just not been sleeping well. She goes to sleep fine with Ambien but she cannot stay asleep. When she can get back to sleep the phone usually brings her she has to get up to start her day. She denies any cough or wheezing. She has had some sinus pressure and drainage for past couple of days.  She has been using her albuterol inhaler more often. She is not on any daily maintence inhalers. She does report a lot of abdominal cramping, bloating off and on loose stools. She has a history of IBS. She denies any overt acid reflux she does a lot of epigastric discomfort. This anything she eats will make her go to the bathroom. She is more sensitive to dairy.  Review of Systems  All other systems reviewed and are negative.      Objective:   Physical Exam  Constitutional: She is oriented to person, place, and time. She appears well-developed and well-nourished.  HENT:  Head: Normocephalic and atraumatic.  Right Ear: External ear normal.  Left Ear: External ear normal.  Nose: Nose normal.  Mouth/Throat: Oropharynx is clear and moist. No oropharyngeal exudate.  Eyes: Conjunctivae are normal. Right eye exhibits no discharge. Left eye exhibits no discharge.  Neck: Normal range of motion. Neck supple.  Cardiovascular: Normal rate, regular rhythm and normal heart sounds.   Pulmonary/Chest: Effort normal and breath sounds normal. She has no wheezes.  Abdominal: Soft. Bowel sounds are normal.  Some diffuse tenderness over the epigastric and left upper quadrant to palpation. No guarding or rebound.  Neurological: She is alert and oriented to person, place, and time.  Skin: Skin is dry.  Psychiatric: She has a normal mood and affect. Her behavior  is normal.          Assessment & Plan:  Fatigue/sleeping difficulties-continue taking Ambien. Will check CBC and TSH Consider adding melatonin 10 mg one hour before bedtime. I do think that not sleeping is causing some of her fatigue. Follow-up in 4 weeks can discuss other action.  IBS/bloating-symptoms seem consistent with irritable bowel syndrome. She has never tried anything for this. We will start Bentyl twice a day before meals to see if this gives her any relief. Also talked about starting an acid reducer. She wants to hold off and try the Bentyl first that is fine but I think she should consider protonic 40 mg in the morning to see if there is any reflux causing some of her bloating and symptoms. Also encouraged patient to start a daily probiotic.  COPD- only using albuterol inhaler but more frequently. Need to follow up in one month for PFT's with Dr. Madilyn Fireman to see if needs further treatment for COPD. Briefly stated need for smoking cessation.    I did spend 30 minutes with pt and 50 percent of visit spent counseling patient regarding treatment plan.

## 2014-07-27 ENCOUNTER — Ambulatory Visit: Payer: Medicare Other | Admitting: Family Medicine

## 2014-07-27 ENCOUNTER — Other Ambulatory Visit: Payer: Self-pay | Admitting: Family Medicine

## 2014-08-01 ENCOUNTER — Other Ambulatory Visit: Payer: Self-pay | Admitting: Family Medicine

## 2014-08-03 ENCOUNTER — Other Ambulatory Visit: Payer: Self-pay | Admitting: Family Medicine

## 2014-08-03 ENCOUNTER — Encounter: Payer: Self-pay | Admitting: Family Medicine

## 2014-08-03 DIAGNOSIS — J029 Acute pharyngitis, unspecified: Secondary | ICD-10-CM

## 2014-08-03 DIAGNOSIS — J329 Chronic sinusitis, unspecified: Secondary | ICD-10-CM

## 2014-08-05 ENCOUNTER — Other Ambulatory Visit: Payer: Self-pay | Admitting: Family Medicine

## 2014-08-12 ENCOUNTER — Encounter: Payer: Self-pay | Admitting: Family Medicine

## 2014-08-12 ENCOUNTER — Telehealth: Payer: Self-pay | Admitting: Vascular Surgery

## 2014-08-12 NOTE — Telephone Encounter (Signed)
-----   Message from Perry, NP sent at 08/11/2014  5:28 PM EST ----- Regarding: RE: Follow Up [name], Pt was supposed to have been seen by Dr. Bridgett Larsson or me the day she had the renal and AAA Duplex in Sept. 2014, then was supposed to follow up with same testing and see me or Dr. Bridgett Larsson in Sept. 2015. So she needs to be scheduled for renal artery Duplex and AAA Duplex and see me within the next 1-2 months, no urgency re a return visit but needs to be seen by me when she returns for the Duplexes. Thank you, Vinnie Level  ----- Message -----    From: Rufina Falco    Sent: 08/11/2014   3:27 PM      To: Sharmon Leyden Nickel, NP Subject: FW: Follow Up                                  Vinnie Level,   Will you review the results of the test 04/25/2013 and advise when the patient should return?  [name]   ----- Message -----    From: Gena Fray    Sent: 08/11/2014   9:53 AM      To: Gardiner Fanti Reaves Subject: Follow Up                                      [name],  This patient called to see if she needed to come back in. She was a lab only on 04/25/2013. I don't have any dictation to go by. Do you have a way to see if she needs a follow up appointment?  Thanks! Hinton Dyer

## 2014-08-12 NOTE — Telephone Encounter (Signed)
Scheduled follow-up appointment on 09/11/14.

## 2014-08-14 ENCOUNTER — Encounter: Payer: Self-pay | Admitting: Family Medicine

## 2014-08-14 ENCOUNTER — Ambulatory Visit (INDEPENDENT_AMBULATORY_CARE_PROVIDER_SITE_OTHER): Payer: Medicare Other | Admitting: Family Medicine

## 2014-08-14 VITALS — BP 98/55 | HR 67 | Ht 67.0 in | Wt 197.0 lb

## 2014-08-14 DIAGNOSIS — I952 Hypotension due to drugs: Secondary | ICD-10-CM | POA: Diagnosis not present

## 2014-08-14 DIAGNOSIS — E1140 Type 2 diabetes mellitus with diabetic neuropathy, unspecified: Secondary | ICD-10-CM

## 2014-08-14 DIAGNOSIS — R5383 Other fatigue: Secondary | ICD-10-CM | POA: Diagnosis not present

## 2014-08-14 LAB — POCT GLYCOSYLATED HEMOGLOBIN (HGB A1C): HEMOGLOBIN A1C: 6.1

## 2014-08-14 NOTE — Progress Notes (Signed)
Subjective:    Patient ID: [name], female    DOB: [birth year], 69 y.o.   MRN: [id number]  HPI pt reports that she has been feeling weak, shakey, and exhausted she has been feeling like this since last OV w/Jade 07/22/14. She has chronic diarrhea but this isn't worse. No fever.  + nausea and dec appetite for about 1 week.  She cuts her metoprolol in half. Takes half twice a day. Has chronic sinus drainage but no acute sxs.  Feels  Ike room sway some but that has been going on for years but says has been worse over the last week. No recent changes in medications.   Diabetes - no hypoglycemic events. No wounds or sores that are not healing well. No increased thirst or urination. Checking glucose at home. Taking medications as prescribed without any side effects   Review of Systems  BP 98/55 mmHg  Pulse 67  Ht 5\' 7"  (1.702 m)  Wt 197 lb (89.359 kg)  BMI 30.85 kg/m2  SpO2 95%    Allergies  Allergen Reactions  . Varenicline Nausea And Vomiting    Past Medical History  Diagnosis Date  . Hypertension   . Hyperlipidemia   . PUD (peptic ulcer disease)   . Charcot-Marie-Tooth disease   . Arthritis   . Neurogenic bladder   . Diabetic peripheral neuropathy   . Osteopenia   . Post-menopausal   . Insomnia   . COPD (chronic obstructive pulmonary disease)   . DDD (degenerative disc disease)     Lumbar and lumbosacral  . Tobacco dependence   . Thyroid disease     hypo  . Gastric ulcer     w/o hemorrhage  . Diabetes mellitus     type 2  . Depression   . Obesity   . Unspecified hereditary and idiopathic peripheral neuropathy   . Lumbosacral root lesions, not elsewhere classified   . Other symptoms referable to back   . Thoracic spondylosis without myelopathy   . Facet syndrome, lumbar   . Spinal stenosis, lumbar region, without neurogenic claudication     Past Surgical History  Procedure Laterality Date  . Cholecystectomy    . Replacement total knee    . Fluoroscopic  l5 dorsal medial branch bloc    . Bilateral l3 medial branch block    . Fibroid tumor removal    . Renal artery stent  11/28/2010    left  . Ankle reconstruction      left ankle, plate and pins     History   Social History  . Marital Status: Widowed    Spouse Name: N/A    Number of Children: N/A  . Years of Education: N/A   Occupational History  . Not on file.   Social History Main Topics  . Smoking status: Current Every Day Smoker -- 1.00 packs/day for 49 years    Types: Cigarettes  . Smokeless tobacco: Never Used     Comment: pt states she is going to try the E-cig  . Alcohol Use: No  . Drug Use: No  . Sexual Activity: Not on file   Other Topics Concern  . Not on file   Social History Narrative    Family History  Problem Relation Age of Onset  . Stroke Mother   . Heart disease Father 64    heart attack  . Hypertension Father   . Cancer Father     lung  . Diabetes Paternal Aunt  insulin dependent    Outpatient Encounter Prescriptions as of 08/14/2014  Medication Sig  . alendronate (FOSAMAX) 70 MG tablet TAKE 1 TABLET EVERY 7 DAYS. TAKE IN THE MORNING WITH A FULL GLASS OF WATER ON AN EMPTY STOMACH. DO NOT INGEST ANY OTHER FOOD OR DRINK  . AMBULATORY NON FORMULARY MEDICATION Medication Name: Rollator Walker with seat. Fax to [phone]  . aspirin EC 81 MG tablet Take 81 mg by mouth daily.  . clopidogrel (PLAVIX) 75 MG tablet TAKE 1 TABLET DAILY  . dicyclomine (BENTYL) 10 MG capsule Take 1 capsule (10 mg total) by mouth 2 (two) times daily.  . furosemide (LASIX) 40 MG tablet TAKE 1 OR 2 TABLETS EVERY MORNING.  Marland Kitchen gabapentin (NEURONTIN) 400 MG capsule TAKE ONE CAPSULE THREE TIMES DAILY AS NEEDED  . glipiZIDE (GLUCOTROL) 5 MG tablet Take one tablet before breakfast **OFFICE APPOINTMENT NEEDED FOR REFILL**  . glucose blood test strip 1 each as needed. Use as instructed   . HYDROcodone-acetaminophen (NORCO) 10-325 MG per tablet Take 1 tablet by mouth every 6 (six)  hours as needed. Do not take more that 5 tabs a day  . Lancets MISC by Does not apply route.    Marland Kitchen LANTUS SOLOSTAR 100 UNIT/ML Solostar Pen INJECT 48 UNITS INTO THE SKIN DAILY  . levothyroxine (SYNTHROID, LEVOTHROID) 100 MCG tablet TAKE ONE TABLET DAILY BEFORE BREAKFAST ON AN EMPTY STOMACH.  . metFORMIN (GLUCOPHAGE) 500 MG tablet TAKE ONE TABLET THREE TIMES DAILY  . metoprolol (LOPRESSOR) 100 MG tablet TAKE ONE TABLET TWICE DAILY  . nitroGLYCERIN (NITROSTAT) 0.4 MG SL tablet Place 1 tablet (0.4 mg total) under the tongue every 5 (five) minutes as needed.  . pantoprazole (PROTONIX) 40 MG tablet Take 1 tablet (40 mg total) by mouth daily.  . simvastatin (ZOCOR) 40 MG tablet TAKE 1 AT BEDTIME  . VENTOLIN HFA 108 (90 BASE) MCG/ACT inhaler Inhale 2 puffs into the lungs every 6 (six) hours as needed for wheezi ng.  . zolpidem (AMBIEN) 5 MG tablet TAKE ONE TABLET DAILY AT BEDTIME AS NEEDED FOR SLEEP  . [DISCONTINUED] benazepril (LOTENSIN) 20 MG tablet TAKE 1 TABLET DAILY.  . [DISCONTINUED] amoxicillin-clavulanate (AUGMENTIN) 875-125 MG per tablet Take 1 tablet by mouth 2 (two) times daily.          Objective:   Physical Exam  Constitutional: She is oriented to person, place, and time. She appears well-developed and well-nourished.  HENT:  Head: Normocephalic and atraumatic.  Neck: Neck supple. No thyromegaly present.  Cardiovascular: Normal rate, regular rhythm and normal heart sounds.   No carotid bruits.   Pulmonary/Chest: Effort normal and breath sounds normal.  Abdominal: Soft. Bowel sounds are normal. She exhibits no distension and no mass. There is no tenderness. There is no rebound and no guarding.  Musculoskeletal: She exhibits no edema.  Lymphadenopathy:    She has no cervical adenopathy.  Neurological: She is alert and oriented to person, place, and time.  Skin: Skin is warm and dry.  Psychiatric: She has a normal mood and affect. Her behavior is normal.          Assessment  & Plan:  Hypotension- Suspect her weakness, loss of appetite and nausea may be related to low BP. Stop Benazepril. Continue BB.  Check BP at home if able.  Daughter will occ check for her.  O/W f/u in one week for BP check. Check electrolytes and renal function.   Fatigue - unclear but suspect related to hypotension as above.  Consider GI issue if not improving with reducing BP meds. No sign of acute CHF as she does have diastolic dysfunction. Since smoker consider CXR if not improving.   DM- well controlled. F/U in 3 months. Foot and eye exam UTD.

## 2014-08-14 NOTE — Patient Instructions (Signed)
Stop the benazepril.

## 2014-08-15 LAB — COMPLETE METABOLIC PANEL WITH GFR
ALT: 13 U/L (ref 0–35)
AST: 16 U/L (ref 0–37)
Albumin: 4 g/dL (ref 3.5–5.2)
Alkaline Phosphatase: 46 U/L (ref 39–117)
BUN: 28 mg/dL — AB (ref 6–23)
CO2: 30 mEq/L (ref 19–32)
CREATININE: 2.12 mg/dL — AB (ref 0.50–1.10)
Calcium: 9.7 mg/dL (ref 8.4–10.5)
Chloride: 95 mEq/L — ABNORMAL LOW (ref 96–112)
GFR, EST NON AFRICAN AMERICAN: 23 mL/min — AB
GFR, Est African American: 27 mL/min — ABNORMAL LOW
Glucose, Bld: 87 mg/dL (ref 70–99)
Potassium: 4.1 mEq/L (ref 3.5–5.3)
SODIUM: 138 meq/L (ref 135–145)
Total Bilirubin: 0.9 mg/dL (ref 0.2–1.2)
Total Protein: 6.8 g/dL (ref 6.0–8.3)

## 2014-08-16 ENCOUNTER — Encounter: Payer: Self-pay | Admitting: Family Medicine

## 2014-08-17 ENCOUNTER — Other Ambulatory Visit: Payer: Self-pay | Admitting: *Deleted

## 2014-08-17 DIAGNOSIS — R7989 Other specified abnormal findings of blood chemistry: Secondary | ICD-10-CM

## 2014-08-20 DIAGNOSIS — R0982 Postnasal drip: Secondary | ICD-10-CM | POA: Diagnosis not present

## 2014-08-20 DIAGNOSIS — H608X9 Other otitis externa, unspecified ear: Secondary | ICD-10-CM | POA: Diagnosis not present

## 2014-08-25 ENCOUNTER — Other Ambulatory Visit: Payer: Self-pay | Admitting: Family Medicine

## 2014-08-25 ENCOUNTER — Ambulatory Visit (INDEPENDENT_AMBULATORY_CARE_PROVIDER_SITE_OTHER): Payer: Medicare Other | Admitting: Family Medicine

## 2014-08-25 DIAGNOSIS — F17200 Nicotine dependence, unspecified, uncomplicated: Secondary | ICD-10-CM

## 2014-08-25 DIAGNOSIS — F17210 Nicotine dependence, cigarettes, uncomplicated: Secondary | ICD-10-CM

## 2014-08-25 DIAGNOSIS — J441 Chronic obstructive pulmonary disease with (acute) exacerbation: Secondary | ICD-10-CM

## 2014-08-25 DIAGNOSIS — J449 Chronic obstructive pulmonary disease, unspecified: Secondary | ICD-10-CM | POA: Diagnosis not present

## 2014-08-25 LAB — SPIROMETRY WITH GRAPH

## 2014-08-25 MED ORDER — ALBUTEROL SULFATE (2.5 MG/3ML) 0.083% IN NEBU
2.5000 mg | INHALATION_SOLUTION | Freq: Once | RESPIRATORY_TRACT | Status: AC
  Administered 2014-08-25: 2.5 mg via RESPIRATORY_TRACT

## 2014-08-25 MED ORDER — UMECLIDINIUM BROMIDE 62.5 MCG/INH IN AEPB: 1.0000 | INHALATION_SPRAY | Freq: Every day | RESPIRATORY_TRACT | Status: DC

## 2014-08-25 NOTE — Progress Notes (Signed)
   Subjective:    Patient ID: [name], female    DOB: 1946-02-23, 69 y.o.   MRN: [id number]  HPI  69 year old female who currently smokes one pack per day and has for 53 years. She is here today for spirometry. She does have a diagnosis of COPD. She had a CT done about a year ago but no 5 that showed some scarring at the apices bilaterally. And a pulmonary lung nodule. She says that smoking is a very helpful habit for her. She feels like it's very social for her. She's not very active and systems a lot of her time smoking. In fact she used to smoke 2 packs per day but a couple years ago had cut back to one pack per day. [place] and her father was a Ecologist.   Review of Systems     Objective:   Physical Exam  Constitutional: She is oriented to person, place, and time. She appears well-developed and well-nourished.  HENT:  Head: Normocephalic and atraumatic.  Eyes: Conjunctivae and EOM are normal.  Cardiovascular: Normal rate.   Pulmonary/Chest: Effort normal.  Neurological: She is alert and oriented to person, place, and time.  Skin: Skin is dry. No pallor.  Psychiatric: She has a normal mood and affect. Her behavior is normal.          Assessment & Plan:  Spirometry 01/24/2015 shows FVC of 77%, FEV1 of 69% and ratio of 68%. No significant improvement post neb tx.  bronchodilator. Results most consistent with mild restriction and moderate obstruction. We discussed her results today. That puts her into the category of moderate COPD. Would be recommended that she be on an anti-cholinergic. We will start her on Brea with 1 puff 1 time a day. I'll see her back in 6 weeks to make sure that she's tolerating it well and to make sure that she's not having any palms with the device itself. Continue the albuterol as needed. Recommend repeat spirometry in one year. Most importantly discussed smoking cessation.  Tobacco abuse-we spent a good 20 minutes  discussing strategies and options for smoking cessation. At the end she decided to work on cutting back and then maybe retrying the Chantix at the lower strength 1 she gets down to about a half or quarter pack a day. She says the full strength Chantix made her extremely nauseated and causes vivid dreams. She also felt like it really didn't curb her desire to smoke. We discussed the pros and cons of the medication and how it works. And that she would need to do everything in her power to reduce her recurrence of smoking such as not allowing her friends to smoke in her home and removing all cigarettes.

## 2014-08-26 ENCOUNTER — Other Ambulatory Visit: Payer: Self-pay | Admitting: Family Medicine

## 2014-08-28 ENCOUNTER — Other Ambulatory Visit: Payer: Self-pay | Admitting: Family Medicine

## 2014-09-02 ENCOUNTER — Other Ambulatory Visit: Payer: Self-pay | Admitting: *Deleted

## 2014-09-02 MED ORDER — GABAPENTIN 400 MG PO CAPS: ORAL_CAPSULE | ORAL | Status: DC

## 2014-09-02 MED ORDER — LEVOTHYROXINE SODIUM 100 MCG PO TABS: ORAL_TABLET | ORAL | Status: DC

## 2014-09-02 MED ORDER — ZOLPIDEM TARTRATE 5 MG PO TABS: ORAL_TABLET | ORAL | Status: DC

## 2014-09-02 MED ORDER — GLIPIZIDE 5 MG PO TABS: ORAL_TABLET | ORAL | Status: DC

## 2014-09-02 MED ORDER — METOPROLOL TARTRATE 100 MG PO TABS: 100.0000 mg | ORAL_TABLET | Freq: Two times a day (BID) | ORAL | Status: DC

## 2014-09-02 MED ORDER — METFORMIN HCL 500 MG PO TABS: 500.0000 mg | ORAL_TABLET | Freq: Three times a day (TID) | ORAL | Status: DC

## 2014-09-02 MED ORDER — FUROSEMIDE 40 MG PO TABS: ORAL_TABLET | ORAL | Status: DC

## 2014-09-02 MED ORDER — CLOPIDOGREL BISULFATE 75 MG PO TABS: 75.0000 mg | ORAL_TABLET | Freq: Every day | ORAL | Status: DC

## 2014-09-02 MED ORDER — SIMVASTATIN 40 MG PO TABS: ORAL_TABLET | ORAL | Status: DC

## 2014-09-02 MED ORDER — INSULIN GLARGINE 100 UNIT/ML SOLOSTAR PEN: PEN_INJECTOR | SUBCUTANEOUS | Status: DC

## 2014-09-02 MED ORDER — DICYCLOMINE HCL 10 MG PO CAPS: 10.0000 mg | ORAL_CAPSULE | Freq: Two times a day (BID) | ORAL | Status: DC

## 2014-09-08 ENCOUNTER — Other Ambulatory Visit: Payer: Self-pay | Admitting: *Deleted

## 2014-09-08 ENCOUNTER — Telehealth: Payer: Self-pay | Admitting: Family Medicine

## 2014-09-08 DIAGNOSIS — I701 Atherosclerosis of renal artery: Secondary | ICD-10-CM

## 2014-09-08 DIAGNOSIS — I714 Abdominal aortic aneurysm, without rupture, unspecified: Secondary | ICD-10-CM

## 2014-09-08 DIAGNOSIS — Z48812 Encounter for surgical aftercare following surgery on the circulatory system: Secondary | ICD-10-CM

## 2014-09-08 NOTE — Telephone Encounter (Signed)
Please call patient and let her know that we did receive a letter from her insurance that her Incruse will no longer be covered. We are going to switch her to Spiriva. Just check to see if she wants this sent to her mail order or her local pharmacy I'm not sure.

## 2014-09-08 NOTE — Telephone Encounter (Signed)
Pt informed. She stated that she stopped using bc it made her heart race. She would like to wait until February to get the spiriva sent. She will call 2 wks prior when ready for it to be sent.[name], [name]

## 2014-09-09 ENCOUNTER — Encounter: Payer: Self-pay | Admitting: Family

## 2014-09-11 ENCOUNTER — Ambulatory Visit (INDEPENDENT_AMBULATORY_CARE_PROVIDER_SITE_OTHER)
Admission: RE | Admit: 2014-09-11 | Discharge: 2014-09-11 | Disposition: A | Discharge status: Home / Self Care | Payer: Medicare Other | Source: Ambulatory Visit | Attending: Family | Admitting: Family

## 2014-09-11 ENCOUNTER — Ambulatory Visit (HOSPITAL_COMMUNITY)
Admission: RE | Admit: 2014-09-11 | Discharge: 2014-09-11 | Disposition: A | Discharge status: Home / Self Care | Payer: Medicare Other | Source: Ambulatory Visit | Attending: Family | Admitting: Family

## 2014-09-11 ENCOUNTER — Encounter: Payer: Self-pay | Admitting: Family

## 2014-09-11 ENCOUNTER — Ambulatory Visit (INDEPENDENT_AMBULATORY_CARE_PROVIDER_SITE_OTHER): Payer: Medicare Other | Admitting: Family

## 2014-09-11 VITALS — BP 147/87 | HR 64 | Resp 18 | Ht 67.0 in | Wt 200.0 lb

## 2014-09-11 DIAGNOSIS — I723 Aneurysm of iliac artery: Secondary | ICD-10-CM | POA: Insufficient documentation

## 2014-09-11 DIAGNOSIS — E119 Type 2 diabetes mellitus without complications: Secondary | ICD-10-CM | POA: Diagnosis not present

## 2014-09-11 DIAGNOSIS — I714 Abdominal aortic aneurysm, without rupture, unspecified: Secondary | ICD-10-CM

## 2014-09-11 DIAGNOSIS — I701 Atherosclerosis of renal artery: Secondary | ICD-10-CM | POA: Diagnosis not present

## 2014-09-11 DIAGNOSIS — F17200 Nicotine dependence, unspecified, uncomplicated: Secondary | ICD-10-CM

## 2014-09-11 DIAGNOSIS — Z87448 Personal history of other diseases of urinary system: Secondary | ICD-10-CM | POA: Diagnosis not present

## 2014-09-11 DIAGNOSIS — F17210 Nicotine dependence, cigarettes, uncomplicated: Secondary | ICD-10-CM

## 2014-09-11 DIAGNOSIS — Z8679 Personal history of other diseases of the circulatory system: Secondary | ICD-10-CM

## 2014-09-11 DIAGNOSIS — I77811 Abdominal aortic ectasia: Secondary | ICD-10-CM | POA: Diagnosis not present

## 2014-09-11 DIAGNOSIS — Z48812 Encounter for surgical aftercare following surgery on the circulatory system: Secondary | ICD-10-CM | POA: Diagnosis not present

## 2014-09-11 DIAGNOSIS — Z720 Tobacco use: Secondary | ICD-10-CM | POA: Diagnosis not present

## 2014-09-11 NOTE — Patient Instructions (Addendum)
Renal Artery Stenosis Renal artery stenosis (RAS) is the narrowing of the artery that supplies blood to the kidney. If the narrowing is critical and the kidney does not get enough blood, hypertension (high blood pressure) can develop. This is called renal vascular hypertension (RVH). This is a common, uncommon cause of secondary hypertension. It does not usually happen until there is at least a 70% narrowing of the artery. Decreased blood flow through the renal artery causes the kidney to release increased amounts of a hormone. It is called renin. Renin is a strong blood pressure regulator. When it is high, it causes changes that lead to hypertension. Eventually the kidney not receiving enough blood may shrink in size and become less useful. The high blood pressure that is produced can eventually damage and destroy the remaining kidney. This is called hypertensive nephrosclerosis. If both kidneys fail, it will lead to chronic renal failure.  CAUSES  Most renal artery stenosis is caused by a hardening of the arteries (atherosclerosis). This is called Atherosclerotic Renal Artery Stenosis (AS-RAS). It is caused by a build-up of cholesterol (plaques) on the inner lining of the renal artery. A much less common cause is Fibromuscular Dysplasia (FMD). With it, there is an abnormality in the muscular lining of the renal artery. FMD-RAS occurs almost exclusively in women aged 71 to 29. It rarely affects African Americans or Asians.  SYMPTOMS  Often high blood pressure is discovered on a routine blood pressure check. It may be the only sign that something is wrong. Other problems that may occur are:  You may develop calf pain when walking. This is called intermittent claudication. It may be a sign of bad circulation in the legs.  Inability to use certain blood pressure pills such as angiotensin-I (ACE-I) inhibitors or angiotensin receptor blockers (ARB's). These could cause sudden drops in blood pressure with  worsening of kidney function.  More than three antihypertensive medications may be needed for blood pressure control.  New onset of high blood pressure if you are over 55. DIAGNOSIS  Your caregiver may find suggestions of this on exam if he finds bruits (like murmurs) on listening to your abdomen (belly) or the large arteries in your neck. Your caregiver may also suspect this there is a sudden worsening of your blood pressure when it has been well controlled and you are over age 22. Additional testing that may be done includes:  One diagnostic method used for renal artery stenosis (RAS) is to measure and compare the level of renin, (blood pressure-regulating hormone released by the kidneys), in the right to the left renal veins. If the amount of renin released by one-side is markedly higher than the other, this identifies a high renin-releasing kidney consistent with RAS.  FMD-RAS is often found on renal scan with ACE-inhibitor challenge, or ultrasound with Doppler.  FMD responds well to angioplasty and stenting. The results of stenting in FMD are usually long lasting. RISK FACTORS  Most renal artery stenosis is caused by a hardening of the arteries. This is called atherosclerosis. Other risk factors associated with the development of atherosclerotic RAS include the following:   Carotid artery disease.  Obesity.  High blood pressure.  Heredity.  Old age.  Fibromuscular dysplasia.  Diabetes mellitus.  Smoking.  Hardening of the arteries. TREATMENT   Renal vascular hypertension can be very severe. It can also be difficult to control.  Medication is used to control high blood pressure (hypertension). Blood pressure medications that directly affect the renin angiotensin pathway  can be used toe help control blood pressure. ACE inhibitors and angiotensin receptor blockers (ARB's) are often effective in patients with unilateral RAS. In some cases, patients with RAS are resistant to  these medications.  In patients with bilateral RAS, these medications must be used carefully. They may cause acute renal failure (ARF). If acute renal failure develops (if creatinine increases by more than 30%), the medication is discontinued. The patient is evaluated for bilateral RAS.  Angioplasty and stenting may be used to improve blood flow. The goal is to improve the circulation of blood flow to the kidney and prevent the release of excess renin, which can help to decrease blood pressure. This helps to prevent atrophy of the kidney. In general, patients with AS-RAS should have stenting done. This is because plasty by itself has a high incidence of re-stenosis.  Surgery to bypass the narrowing may be done. If the kidney with RAS has diminished in size or strength (atrophied ), surgical removal of the kidney may be advised. This is called nephrectomy. Document Released: 04/19/2005 Document Revised: 10/16/2011 Document Reviewed: 11/06/2013 [facility] Patient Information 2015 [place], [place]. This information is not intended to replace advice given to you by your health care provider. Make sure you discuss any questions you have with your health care provider.   Smoking Cessation Quitting smoking is important to your health and has many advantages. However, it is not always easy to quit since nicotine is a very addictive drug. Oftentimes, people try 3 times or more before being able to quit. This document explains the best ways for you to prepare to quit smoking. Quitting takes hard work and a lot of effort, but you can do it. ADVANTAGES OF QUITTING SMOKING  You will live longer, feel better, and live better.  Your body will feel the impact of quitting smoking almost immediately.  Within 20 minutes, blood pressure decreases. Your pulse returns to its normal level.  After 8 hours, carbon monoxide levels in the blood return to normal. Your oxygen level increases.  After 24 hours, the chance of  having a heart attack starts to decrease. Your breath, hair, and body stop smelling like smoke.  After 48 hours, damaged nerve endings begin to recover. Your sense of taste and smell improve.  After 72 hours, the body is virtually free of nicotine. Your bronchial tubes relax and breathing becomes easier.  After 2 to 12 weeks, lungs can hold more air. Exercise becomes easier and circulation improves.  The risk of having a heart attack, stroke, cancer, or lung disease is greatly reduced.  After 1 year, the risk of coronary heart disease is cut in half.  After 5 years, the risk of stroke falls to the same as a nonsmoker.  After 10 years, the risk of lung cancer is cut in half and the risk of other cancers decreases significantly.  After 15 years, the risk of coronary heart disease drops, usually to the level of a nonsmoker.  If you are pregnant, quitting smoking will improve your chances of having a healthy baby.  The people you live with, especially any children, will be healthier.  You will have extra money to spend on things other than cigarettes. QUESTIONS TO THINK ABOUT BEFORE ATTEMPTING TO QUIT You may want to talk about your answers with your health care provider.  Why do you want to quit?  If you tried to quit in the past, what helped and what did not?  What will be the most difficult  situations for you after you quit? How will you plan to handle them?  Who can help you through the tough times? Your family? Friends? A health care provider?  What pleasures do you get from smoking? What ways can you still get pleasure if you quit? Here are some questions to ask your health care provider:  How can you help me to be successful at quitting?  What medicine do you think would be best for me and how should I take it?  What should I do if I need more help?  What is smoking withdrawal like? How can I get information on withdrawal? GET READY  Set a quit date.  Change your  environment by getting rid of all cigarettes, ashtrays, matches, and lighters in your home, car, or work. Do not let people smoke in your home.  Review your past attempts to quit. Think about what worked and what did not. GET SUPPORT AND ENCOURAGEMENT You have a better chance of being successful if you have help. You can get support in many ways.  Tell your family, friends, and coworkers that you are going to quit and need their support. Ask them not to smoke around you.  Get individual, group, or telephone counseling and support. Programs are available at [employer] and health centers. Call your local health department for information about programs in your area.  Spiritual beliefs and practices may help some smokers quit.  Download a "quit meter" on your computer to keep track of quit statistics, such as how long you have gone without smoking, cigarettes not smoked, and money saved.  Get a self-help book about quitting smoking and staying off tobacco. Fisher yourself from urges to smoke. Talk to someone, go for a walk, or occupy your time with a task.  Change your normal routine. Take a different route to work. Drink tea instead of coffee. Eat breakfast in a different place.  Reduce your stress. Take a hot bath, exercise, or read a book.  Plan something enjoyable to do every day. Reward yourself for not smoking.  Explore interactive web-based programs that specialize in helping you quit. GET MEDICINE AND USE IT CORRECTLY Medicines can help you stop smoking and decrease the urge to smoke. Combining medicine with the above behavioral methods and support can greatly increase your chances of successfully quitting smoking.  Nicotine replacement therapy helps deliver nicotine to your body without the negative effects and risks of smoking. Nicotine replacement therapy includes nicotine gum, lozenges, inhalers, nasal sprays, and skin patches. Some may be  available over-the-counter and others require a prescription.  Antidepressant medicine helps people abstain from smoking, but how this works is unknown. This medicine is available by prescription.  Nicotinic receptor partial agonist medicine simulates the effect of nicotine in your brain. This medicine is available by prescription. Ask your health care provider for advice about which medicines to use and how to use them based on your health history. Your health care provider will tell you what side effects to look out for if you choose to be on a medicine or therapy. Carefully read the information on the package. Do not use any other product containing nicotine while using a nicotine replacement product.  RELAPSE OR DIFFICULT SITUATIONS Most relapses occur within the first 3 months after quitting. Do not be discouraged if you start smoking again. Remember, most people try several times before finally quitting. You may have symptoms of withdrawal because your body is  used to nicotine. You may crave cigarettes, be irritable, feel very hungry, cough often, get headaches, or have difficulty concentrating. The withdrawal symptoms are only temporary. They are strongest when you first quit, but they will go away within 10-14 days. To reduce the chances of relapse, try to:  Avoid drinking alcohol. Drinking lowers your chances of successfully quitting.  Reduce the amount of caffeine you consume. Once you quit smoking, the amount of caffeine in your body increases and can give you symptoms, such as a rapid heartbeat, sweating, and anxiety.  Avoid smokers because they can make you want to smoke.  Do not let weight gain distract you. Many smokers will gain weight when they quit, usually less than 10 pounds. Eat a healthy diet and stay active. You can always lose the weight gained after you quit.  Find ways to improve your mood other than smoking. FOR MORE INFORMATION  www.smokefree.gov  Document Released:  07/18/2001 Document Revised: 12/08/2013 Document Reviewed: 11/02/2011 [facility] Patient Information 2015 [place], [place]. This information is not intended to replace advice given to you by your health care provider. Make sure you discuss any questions you have with your health care provider.    Smoking Cessation, Tips for Success If you are ready to quit smoking, congratulations! You have chosen to help yourself be healthier. Cigarettes bring nicotine, tar, carbon monoxide, and other irritants into your body. Your lungs, heart, and blood vessels will be able to work better without these poisons. There are many different ways to quit smoking. Nicotine gum, nicotine patches, a nicotine inhaler, or nicotine nasal spray can help with physical craving. Hypnosis, support groups, and medicines help break the habit of smoking. WHAT THINGS CAN I DO TO MAKE QUITTING EASIER?  Here are some tips to help you quit for good:  Pick a date when you will quit smoking completely. Tell all of your friends and family about your plan to quit on that date.  Do not try to slowly cut down on the number of cigarettes you are smoking. Pick a quit date and quit smoking completely starting on that day.  Throw away all cigarettes.   Clean and remove all ashtrays from your home, work, and car.  On a card, write down your reasons for quitting. Carry the card with you and read it when you get the urge to smoke.  Cleanse your body of nicotine. Drink enough water and fluids to keep your urine clear or pale yellow. Do this after quitting to flush the nicotine from your body.  Learn to predict your moods. Do not let a bad situation be your excuse to have a cigarette. Some situations in your life might tempt you into wanting a cigarette.  Never have "just one" cigarette. It leads to wanting another and another. Remind yourself of your decision to quit.  Change habits associated with smoking. If you smoked while driving or when  feeling stressed, try other activities to replace smoking. Stand up when drinking your coffee. Brush your teeth after eating. Sit in a different chair when you read the paper. Avoid alcohol while trying to quit, and try to drink fewer caffeinated beverages. Alcohol and caffeine may urge you to smoke.  Avoid foods and drinks that can trigger a desire to smoke, such as sugary or spicy foods and alcohol.  Ask people who smoke not to smoke around you.  Have something planned to do right after eating or having a cup of coffee. For example, plan to take  a walk or exercise.  Try a relaxation exercise to calm you down and decrease your stress. Remember, you may be tense and nervous for the first 2 weeks after you quit, but this will pass.  Find new activities to keep your hands busy. Play with a pen, coin, or rubber band. Doodle or draw things on paper.  Brush your teeth right after eating. This will help cut down on the craving for the taste of tobacco after meals. You can also try mouthwash.   Use oral substitutes in place of cigarettes. Try using lemon drops, carrots, cinnamon sticks, or chewing gum. Keep them handy so they are available when you have the urge to smoke.  When you have the urge to smoke, try deep breathing.  Designate your home as a nonsmoking area.  If you are a heavy smoker, ask your health care provider about a prescription for nicotine chewing gum. It can ease your withdrawal from nicotine.  Reward yourself. Set aside the cigarette money you save and buy yourself something nice.  Look for support from others. Join a support group or smoking cessation program. Ask someone at home or at work to help you with your plan to quit smoking.  Always ask yourself, "Do I need this cigarette or is this just a reflex?" Tell yourself, "Today, I choose not to smoke," or "I do not want to smoke." You are reminding yourself of your decision to quit.  Do not replace cigarette smoking with  electronic cigarettes (commonly called e-cigarettes). The safety of e-cigarettes is unknown, and some may contain harmful chemicals.  If you relapse, do not give up! Plan ahead and think about what you will do the next time you get the urge to smoke. HOW WILL I FEEL WHEN I QUIT SMOKING? You may have symptoms of withdrawal because your body is used to nicotine (the addictive substance in cigarettes). You may crave cigarettes, be irritable, feel very hungry, cough often, get headaches, or have difficulty concentrating. The withdrawal symptoms are only temporary. They are strongest when you first quit but will go away within 10-14 days. When withdrawal symptoms occur, stay in control. Think about your reasons for quitting. Remind yourself that these are signs that your body is healing and getting used to being without cigarettes. Remember that withdrawal symptoms are easier to treat than the major diseases that smoking can cause.  Even after the withdrawal is over, expect periodic urges to smoke. However, these cravings are generally short lived and will go away whether you smoke or not. Do not smoke! WHAT RESOURCES ARE AVAILABLE TO HELP ME QUIT SMOKING? Your health care provider can direct you to community resources or hospitals for support, which may include:  Group support.  Education.  Hypnosis.  Therapy. Document Released: 04/21/2004 Document Revised: 12/08/2013 Document Reviewed: 01/09/2013 [facility] Patient Information 2015 [place], [place]. This information is not intended to replace advice given to you by your health care provider. Make sure you discuss any questions you have with your health care provider.

## 2014-09-11 NOTE — Progress Notes (Signed)
Established Renal Artery Stenosis  History of Present Illness  [name] is a 69 y.o. (Aug 28, 1945) female patient of Dr. Imogene Burn who presents with chief complaint: follow up on renal artery stenosis. Pt underwent L RAS 11/28/10. Previous renal duplex completed on 03/31/11 reveals: R RA stenosis: <60% and L RA stenosis: <60%.  The patient's blood pressure has been hypotensive recently, leading removal of one medication. The patient's urinary history has remained stable. Additionally, the patient has known R CIA aneurysm. She notes no symptoms from this R CIA aneurysm.   Pt denies any hx of stroke or TIA. Pt states her barrier to walking much is numbness in her feet and joint pain in her legs.  Pt's medications include daily ASA, Plavix, beta blocker, statin. She has DM, takes insulin, A1C January 2016 was 6.1. Pt is a current smoker: smokes 1/2 ppd; she is working with her PCP on smoking cessation   Past Medical History  Diagnosis Date  . Hypertension   . Hyperlipidemia   . PUD (peptic ulcer disease)   . Charcot-Marie-Tooth disease   . Arthritis   . Neurogenic bladder   . Diabetic peripheral neuropathy   . Osteopenia   . Post-menopausal   . Insomnia   . COPD (chronic obstructive pulmonary disease)   . DDD (degenerative disc disease)     Lumbar and lumbosacral  . Tobacco dependence   . Thyroid disease     hypo  . Gastric ulcer     w/o hemorrhage  . Diabetes mellitus     type 2  . Depression   . Obesity   . Unspecified hereditary and idiopathic peripheral neuropathy   . Lumbosacral root lesions, not elsewhere classified   . Other symptoms referable to back   . Thoracic spondylosis without myelopathy   . Facet syndrome, lumbar   . Spinal stenosis, lumbar region, without neurogenic claudication     Social History History  Substance Use Topics  . Smoking status: Current Every Day Smoker -- 1.00 packs/day for 49 years    Types: Cigarettes  . Smokeless tobacco:  Never Used     Comment: pt states she is going to try the E-cig  . Alcohol Use: No    Family History Family History  Problem Relation Age of Onset  . Stroke Mother   . Heart disease Father 21    heart attack  . Hypertension Father   . Cancer Father     lung  . Diabetes Paternal Aunt     insulin dependent    Surgical History Past Surgical History  Procedure Laterality Date  . Cholecystectomy    . Replacement total knee    . Fluoroscopic l5 dorsal medial branch bloc    . Bilateral l3 medial branch block    . Fibroid tumor removal    . Renal artery stent  11/28/2010    left  . Ankle reconstruction      left ankle, plate and pins     Allergies  Allergen Reactions  . Varenicline Nausea And Vomiting    Current Outpatient Prescriptions  Medication Sig Dispense Refill  . alendronate (FOSAMAX) 70 MG tablet TAKE 1 TABLET EVERY 7 DAYS. TAKE IN THE MORNING WITH A FULL GLASS OF WATER ON AN EMPTY STOMACH. DO NOT INGEST ANY OTHER FOOD OR DRINK 4 tablet 6  . AMBULATORY NON FORMULARY MEDICATION Medication Name: Rollator Walker with seat. Fax to [phone] 1 Units 0  . aspirin EC 81 MG tablet Take  81 mg by mouth daily.    . clopidogrel (PLAVIX) 75 MG tablet Take 1 tablet (75 mg total) by mouth daily. 90 tablet 1  . dicyclomine (BENTYL) 10 MG capsule Take 1 capsule (10 mg total) by mouth 2 (two) times daily. 180 capsule 1  . furosemide (LASIX) 40 MG tablet TAKE 1 OR 2 TABLETS EVERY MORNING. 90 tablet 1  . gabapentin (NEURONTIN) 400 MG capsule TAKE ONE CAPSULE THREE TIMES DAILY AS NEEDED 270 capsule 1  . glipiZIDE (GLUCOTROL) 5 MG tablet Take one tablet before breakfast 90 tablet 1  . glucose blood test strip 1 each as needed. Use as instructed     . HYDROcodone-acetaminophen (NORCO) 10-325 MG per tablet Take 1 tablet by mouth every 6 (six) hours as needed. Do not take more that 5 tabs a day    . Insulin Glargine (LANTUS SOLOSTAR) 100 UNIT/ML Solostar Pen INJECT 48 UNITS INTO THE SKIN  DAILY 15 mL 6  . Lancets MISC by Does not apply route.      . metFORMIN (GLUCOPHAGE) 500 MG tablet Take 1 tablet (500 mg total) by mouth 3 (three) times daily. 270 tablet 1  . nitroGLYCERIN (NITROSTAT) 0.4 MG SL tablet Place 1 tablet (0.4 mg total) under the tongue every 5 (five) minutes as needed. 10 tablet 1  . pantoprazole (PROTONIX) 40 MG tablet Take 1 tablet (40 mg total) by mouth daily. 30 tablet 3  . simvastatin (ZOCOR) 40 MG tablet TAKE 1 AT BEDTIME 90 tablet 1  . VENTOLIN HFA 108 (90 BASE) MCG/ACT inhaler Inhale 2 puffs into the lungs every 6 (six) hours as needed for wheezi ng. 18 each 3  . zolpidem (AMBIEN) 5 MG tablet TAKE ONE TABLET DAILY AT BEDTIME AS NEEDED FOR SLEEP 90 tablet 3  . levothyroxine (SYNTHROID, LEVOTHROID) 100 MCG tablet TAKE ONE TABLET DAILY BEFORE BREAKFAST ON AN EMPTY STOMACH. 90 tablet 2  . metoprolol (LOPRESSOR) 100 MG tablet Take 1 tablet (100 mg total) by mouth 2 (two) times daily. (Patient not taking: Reported on 09/11/2014) 180 tablet 1   No current facility-administered medications for this visit.    REVIEW OF SYSTEMS: see HPI for pertinent positives and negatives    Physical Examination  Filed Vitals:   09/11/14 1043  BP: 147/87  Pulse: 64  Resp: 18  Height: 5\' 7"  (1.702 m)  Weight: 200 lb (90.719 kg)   Body mass index is 31.32 kg/(m^2).   General: The patient appears their stated age, obese female.   HEENT:  No gross abnormalities Pulmonary: Respirations are non-labored Abdomen: Soft and non-tender. Musculoskeletal: There are no major deformities.   Neurologic: No focal weakness or paresthesias are detected, Skin: There are no ulcer or rashes noted. Psychiatric: The patient has normal affect. Cardiovascular: There is a regular rate and rhythm without significant murmur appreciated.   Vascular: Vessel Right Left  Radial 2+Palpable 2+Palpable  Carotid Audible without bruit audible without bruit  Aorta Not palpable N/A  Femoral  2+Palpable 2+Palpable  Popliteal Not palpable Not palpable  PT notPalpable notPalpable  DP 2+Palpable 2+Palpable    Non-Invasive Vascular Imaging: (09/11/2014)  ABDOMINAL AORTA DUPLEX EVALUATION    INDICATION: Re-evaluation of known right common iliac artery aneurysm    PREVIOUS INTERVENTION(S): None    DUPLEX EXAM:     LOCATION DIAMETER AP (cm) DIAMETER TRANSVERSE (cm) VELOCITIES (cm/sec)  Aorta Proximal 3.2 - 43  Aorta Mid 2.2 2.4 45  Aorta Distal 2.3 2.2 58  Right Common Iliac Artery 2.1  2.1 66  Left Common Iliac Artery 1.5 - 178    Previous max aortic diameter:  2.2 Date: 04/25/13     ADDITIONAL FINDINGS:     IMPRESSION: Diffuse ectasia of the abdominal aortic which is tortuous. Stable aneurysm of the right common iliac artery measuring 2.1 x 2.1 cm.    Compared to the previous exam:  No significant change     Renal Duplex (Date: 09/11/2014) RENAL ARTERY DUPLEX EVALUATION    INDICATION: Follow up left renal artery stent    PREVIOUS INTERVENTION(S): Left renal artery stent placed 11/28/2010    DUPLEX EXAM:     AORTA Peak Systolic Velocity (cm/s): 58    RIGHT  LEFT   Peak Systolic Velocities (cm/s) Comments  Peak Systolic Velocities (cm/s) Comments  107  Renal Artery Origin/Proximal -   88  Renal Artery Mid -   74  Renal Artery Distal 73   -  Accessory Renal Artery (when present) N/A   1.8  Renal / Aortic Ratio (RAR) N/A  9.0 Kidney Size (cm) 8.8  phasic  Renal Vein Phasic    Peak Systolic Velocities (cm/s) Comments  Renal Artery Stent (  ) Peak Systolic Velocities (cm/s) Comments    Proximal 166     Mid 143     Distal -     ADDITIONAL FINDINGS:     IMPRESSION: 1. Technically difficult exam due to bowel gas and body habitus. 2. The left stent velocity appears within normal range, although the stent not thoroughly visualized. 3. Less than 60% stenosis of the right renal artery    Compared to the previous exam:  No significant change     Medical  Decision Making  Shylo Shanes is a 69 y.o. female who is s/p t Left Renal Artery Stent placement on 11/28/10.  Additionally, the patient has known R CIA aneurysm. Today's aortoiliac Duplex reveals diffuse ectasia of the abdominal aortic which is tortuous. Stable aneurysm of the right common iliac artery measuring 2.1 x 2.1 cm, no significant change from previous Duplex on 04/25/13. Today's renal artery Duplex reveals a technically difficult exam due to bowel gas and body habitus. The left stent velocity appears within normal range, although the stent is not thoroughly visualized. Less than 60% stenosis of the right renal artery. No significant change from 04/25/13 Duplex.  Her atherosclerotic risk factors include continued smoking, controlled DM, obesity, and sedentarism.  Barriers to walking include feet neuropathy and joint pain in legs; advised daily seated leg and arm exercises, demonstrated and discussed.  The patient was counseled re smoking cessation and given several free resources re smoking cessation. Face to face time with patient was 25 minutes. Over 50% of this time was spent on counseling and coordination of care.    Based on the patient's vascular studies and examination, I have offered the patient: bilaeteral renal artery duplex, Aortoiliac duplex, and Aortic duplex in 12 months.  I discussed in depth with the patient the nature of atherosclerosis, and emphasized the importance of maximal medical management including strict control of blood pressure, blood glucose, and lipid levels, antiplatelet agents, obtaining regular exercise, and cessation of smoking.  The patient is aware that without maximal medical management the underlying atherosclerotic disease process will progress, limiting the benefit of any interventions.  Thank you for allowing [place] to participate in this patient's care.  Emmakate Hypes, Carma Lair, RN, MSN, FNP-C [facility] Office:  [phone]  09/11/2014, 10:59 AM  Clinic MD:  Imogene Burn

## 2014-09-23 ENCOUNTER — Other Ambulatory Visit: Payer: Self-pay | Admitting: Family Medicine

## 2014-09-23 ENCOUNTER — Encounter: Payer: Self-pay | Admitting: Family Medicine

## 2014-09-24 ENCOUNTER — Other Ambulatory Visit: Payer: Self-pay | Admitting: *Deleted

## 2014-09-24 DIAGNOSIS — M47816 Spondylosis without myelopathy or radiculopathy, lumbar region: Secondary | ICD-10-CM | POA: Diagnosis not present

## 2014-09-24 DIAGNOSIS — M62830 Muscle spasm of back: Secondary | ICD-10-CM | POA: Diagnosis not present

## 2014-09-24 DIAGNOSIS — G894 Chronic pain syndrome: Secondary | ICD-10-CM | POA: Diagnosis not present

## 2014-09-24 DIAGNOSIS — M5136 Other intervertebral disc degeneration, lumbar region: Secondary | ICD-10-CM | POA: Diagnosis not present

## 2014-09-24 DIAGNOSIS — Z79891 Long term (current) use of opiate analgesic: Secondary | ICD-10-CM | POA: Diagnosis not present

## 2014-09-24 MED ORDER — ALENDRONATE SODIUM 70 MG PO TABS: 70.0000 mg | ORAL_TABLET | ORAL | Status: DC

## 2014-09-24 MED ORDER — TIOTROPIUM BROMIDE MONOHYDRATE 18 MCG IN CAPS: 18.0000 ug | ORAL_CAPSULE | Freq: Every day | RESPIRATORY_TRACT | Status: DC

## 2014-09-30 ENCOUNTER — Ambulatory Visit: Payer: PRIVATE HEALTH INSURANCE | Admitting: Family Medicine

## 2014-10-02 ENCOUNTER — Encounter: Payer: Self-pay | Admitting: Family Medicine

## 2014-10-02 ENCOUNTER — Ambulatory Visit (INDEPENDENT_AMBULATORY_CARE_PROVIDER_SITE_OTHER): Payer: Medicare Other | Admitting: Family Medicine

## 2014-10-02 VITALS — BP 123/79 | HR 65 | Wt 203.0 lb

## 2014-10-02 DIAGNOSIS — G629 Polyneuropathy, unspecified: Secondary | ICD-10-CM | POA: Diagnosis not present

## 2014-10-02 DIAGNOSIS — E1342 Other specified diabetes mellitus with diabetic polyneuropathy: Secondary | ICD-10-CM | POA: Diagnosis not present

## 2014-10-02 DIAGNOSIS — M79662 Pain in left lower leg: Secondary | ICD-10-CM

## 2014-10-02 DIAGNOSIS — I701 Atherosclerosis of renal artery: Secondary | ICD-10-CM

## 2014-10-02 DIAGNOSIS — E1142 Type 2 diabetes mellitus with diabetic polyneuropathy: Secondary | ICD-10-CM

## 2014-10-02 MED ORDER — GABAPENTIN 100 MG PO CAPS: 100.0000 mg | ORAL_CAPSULE | Freq: Every day | ORAL | Status: DC

## 2014-10-02 NOTE — Progress Notes (Signed)
   Subjective:    Patient ID: [name], female    DOB: [birth year], 69 y.o.   MRN: [id number]  HPI 3 months ago start having pain in the lateral lower leg just above the ankle. It wakes her up in the middle of the night she describes it as barb wire, raw and continuous pain that lasts for about 5-10 mins. no injury or trauma to site she questions if it has something to do with her neuropathy. Says when happnes will put her foot on the floor and that does seem to help.  No injury 3 months ago.  Has chronic swelling around the ankle. She does have hardware in place.   She already takes gabapentin 400 mg 3 times a day for diabetic peripheral neuropathy. She says this feels very different from her typical neuropathy.  Review of Systems     Objective:   Physical Exam  Constitutional: She is oriented to person, place, and time. She appears well-developed and well-nourished.  HENT:  Head: Normocephalic and atraumatic.  Musculoskeletal:       Legs: Nontender with calf squeeze. She has a well-healed surgical scar over the lateral malleolus. Some hyperpigmentation over the shin. Trace ankle edema. 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 But has 4 out of 5 plantar flexion strength.  Neurological: She is alert and oriented to person, place, and time.  Skin: Skin is warm and dry.  Psychiatric: She has a normal mood and affect. Her behavior is normal.          Assessment & Plan:  Left lateral leg pain above the lateral malleolus-will get x-rays to rule out problems with the hardware in her ankle. Consider referral back to her orthopedic surgeon, Dr. Annie Main pill for further evaluation. In the meantime we are going to increase her gabapentin at her evening dose. She currently takes 400 mg in the evening so will add a next 100-200 mg. Don't go above a total of 60 mg. She will continue her 400 mg in the morning and at lunch.

## 2014-10-06 ENCOUNTER — Ambulatory Visit: Payer: Medicare Other | Admitting: Family Medicine

## 2014-10-23 ENCOUNTER — Other Ambulatory Visit: Payer: Self-pay | Admitting: *Deleted

## 2014-10-23 MED ORDER — ZOLPIDEM TARTRATE 5 MG PO TABS: ORAL_TABLET | ORAL | Status: DC

## 2014-11-20 ENCOUNTER — Encounter: Payer: Self-pay | Admitting: Family Medicine

## 2014-11-20 ENCOUNTER — Ambulatory Visit (INDEPENDENT_AMBULATORY_CARE_PROVIDER_SITE_OTHER): Payer: Medicare Other | Admitting: Family Medicine

## 2014-11-20 VITALS — BP 142/83 | HR 78 | Temp 98.5°F | Ht 67.0 in | Wt 202.0 lb

## 2014-11-20 DIAGNOSIS — R197 Diarrhea, unspecified: Secondary | ICD-10-CM | POA: Diagnosis not present

## 2014-11-20 DIAGNOSIS — I701 Atherosclerosis of renal artery: Secondary | ICD-10-CM | POA: Diagnosis not present

## 2014-11-20 DIAGNOSIS — R110 Nausea: Secondary | ICD-10-CM | POA: Diagnosis not present

## 2014-11-20 MED ORDER — PROMETHAZINE HCL 25 MG PO TABS: 25.0000 mg | ORAL_TABLET | Freq: Three times a day (TID) | ORAL | Status: DC | PRN

## 2014-11-20 MED ORDER — DICYCLOMINE HCL 20 MG PO TABS: 20.0000 mg | ORAL_TABLET | Freq: Three times a day (TID) | ORAL | Status: DC | PRN

## 2014-11-20 NOTE — Progress Notes (Signed)
   Subjective:    Patient ID: [name], female    DOB: March 08, 1946, 69 y.o.   MRN: [id number]  HPI Nausea and diarrhea 10 days with some headaches and some sinus drainage. She's been using Bentyl prescribed by [facility]. Last bout of diarrhea was this morning. No vomiting. No fever. Does complain of chills frequently. No blood in the stool. Watery stools with some cramping.  Using some old phenergan.  Hx of GI ulcer.  Some inc belching.  No recent travel. Didn't eat out recently.  Has been eating a bland diet. No known sick contacts.    Review of Systems     Objective:   Physical Exam  Constitutional: She is oriented to person, place, and time. She appears well-developed and well-nourished.  HENT:  Head: Normocephalic and atraumatic.  Cardiovascular: Normal rate, regular rhythm and normal heart sounds.   Pulmonary/Chest: Effort normal and breath sounds normal.  Abdominal: Soft. Bowel sounds are normal. She exhibits no distension and no mass. There is tenderness. There is no rebound and no guarding.  Mildly tender in the epigastrium  Neurological: She is alert and oriented to person, place, and time.  Skin: Skin is warm and dry.  Psychiatric: She has a normal mood and affect. Her behavior is normal.          Assessmen nausea        Nausea-we'll prescribe Phenergan. She's been using an old prescription.  Diarrhea-will check CBC, liver enzymes, pancreas enzymes as well as check a stool culture. After thinking about it in the room she said she didn't eat a small sub-sandwich and her symptoms started after that. We'll increase dose of Bentyl since the 20 mg works much better for her than the 10 mg.

## 2014-11-20 NOTE — Addendum Note (Signed)
Addended by: [name] D on: 11/20/2014 05:13 PM   Modules accepted: Orders

## 2014-11-21 LAB — CBC WITH DIFFERENTIAL/PLATELET
Basophils Absolute: 0.1 10*3/uL (ref 0.0–0.1)
Basophils Relative: 1 % (ref 0–1)
EOS PCT: 2 % (ref 0–5)
Eosinophils Absolute: 0.2 10*3/uL (ref 0.0–0.7)
HEMATOCRIT: 45.1 % (ref 36.0–46.0)
Hemoglobin: 15.5 g/dL — ABNORMAL HIGH (ref 12.0–15.0)
LYMPHS ABS: 2.9 10*3/uL (ref 0.7–4.0)
LYMPHS PCT: 29 % (ref 12–46)
MCH: 32.8 pg (ref 26.0–34.0)
MCHC: 34.4 g/dL (ref 30.0–36.0)
MCV: 95.6 fL (ref 78.0–100.0)
MONO ABS: 0.7 10*3/uL (ref 0.1–1.0)
MPV: 11 fL (ref 8.6–12.4)
Monocytes Relative: 7 % (ref 3–12)
NEUTROS ABS: 6.2 10*3/uL (ref 1.7–7.7)
NEUTROS PCT: 61 % (ref 43–77)
Platelets: 267 10*3/uL (ref 150–400)
RBC: 4.72 MIL/uL (ref 3.87–5.11)
RDW: 13.3 % (ref 11.5–15.5)
WBC: 10.1 10*3/uL (ref 4.0–10.5)

## 2014-11-21 LAB — COMPLETE METABOLIC PANEL WITH GFR
ALBUMIN: 4.1 g/dL (ref 3.5–5.2)
ALT: 14 U/L (ref 0–35)
AST: 17 U/L (ref 0–37)
Alkaline Phosphatase: 55 U/L (ref 39–117)
BILIRUBIN TOTAL: 0.9 mg/dL (ref 0.2–1.2)
BUN: 14 mg/dL (ref 6–23)
CO2: 32 mEq/L (ref 19–32)
Calcium: 10.1 mg/dL (ref 8.4–10.5)
Chloride: 100 mEq/L (ref 96–112)
Creat: 1.08 mg/dL (ref 0.50–1.10)
GFR, EST AFRICAN AMERICAN: 60 mL/min
GFR, Est Non African American: 52 mL/min — ABNORMAL LOW
Glucose, Bld: 113 mg/dL — ABNORMAL HIGH (ref 70–99)
Potassium: 3.9 mEq/L (ref 3.5–5.3)
Sodium: 142 mEq/L (ref 135–145)
TOTAL PROTEIN: 6.9 g/dL (ref 6.0–8.3)

## 2014-11-21 LAB — LIPASE: Lipase: 16 U/L (ref 0–75)

## 2014-11-21 LAB — AMYLASE: AMYLASE: 55 U/L (ref 0–105)

## 2014-11-23 DIAGNOSIS — R197 Diarrhea, unspecified: Secondary | ICD-10-CM | POA: Diagnosis not present

## 2014-11-27 LAB — STOOL CULTURE

## 2014-12-28 DIAGNOSIS — G894 Chronic pain syndrome: Secondary | ICD-10-CM | POA: Diagnosis not present

## 2014-12-28 DIAGNOSIS — M47816 Spondylosis without myelopathy or radiculopathy, lumbar region: Secondary | ICD-10-CM | POA: Diagnosis not present

## 2014-12-28 DIAGNOSIS — M5136 Other intervertebral disc degeneration, lumbar region: Secondary | ICD-10-CM | POA: Diagnosis not present

## 2014-12-28 DIAGNOSIS — Z79891 Long term (current) use of opiate analgesic: Secondary | ICD-10-CM | POA: Diagnosis not present

## 2014-12-28 DIAGNOSIS — M62830 Muscle spasm of back: Secondary | ICD-10-CM | POA: Diagnosis not present

## 2015-01-12 DIAGNOSIS — R200 Anesthesia of skin: Secondary | ICD-10-CM | POA: Diagnosis not present

## 2015-01-12 DIAGNOSIS — M5416 Radiculopathy, lumbar region: Secondary | ICD-10-CM | POA: Diagnosis not present

## 2015-01-12 DIAGNOSIS — G629 Polyneuropathy, unspecified: Secondary | ICD-10-CM | POA: Diagnosis not present

## 2015-01-13 ENCOUNTER — Telehealth: Payer: Self-pay | Admitting: *Deleted

## 2015-01-13 DIAGNOSIS — M4317 Spondylolisthesis, lumbosacral region: Secondary | ICD-10-CM | POA: Diagnosis not present

## 2015-01-13 DIAGNOSIS — M47816 Spondylosis without myelopathy or radiculopathy, lumbar region: Secondary | ICD-10-CM | POA: Diagnosis not present

## 2015-01-13 DIAGNOSIS — M5137 Other intervertebral disc degeneration, lumbosacral region: Secondary | ICD-10-CM | POA: Diagnosis not present

## 2015-01-13 DIAGNOSIS — M5127 Other intervertebral disc displacement, lumbosacral region: Secondary | ICD-10-CM | POA: Diagnosis not present

## 2015-01-13 DIAGNOSIS — M4806 Spinal stenosis, lumbar region: Secondary | ICD-10-CM | POA: Diagnosis not present

## 2015-01-13 DIAGNOSIS — M47817 Spondylosis without myelopathy or radiculopathy, lumbosacral region: Secondary | ICD-10-CM | POA: Diagnosis not present

## 2015-01-13 DIAGNOSIS — M5136 Other intervertebral disc degeneration, lumbar region: Secondary | ICD-10-CM | POA: Diagnosis not present

## 2015-01-13 DIAGNOSIS — M5126 Other intervertebral disc displacement, lumbar region: Secondary | ICD-10-CM | POA: Diagnosis not present

## 2015-01-13 NOTE — Telephone Encounter (Signed)
Pt called and lvm stating that Dr Bridgett Larsson advised that she needed a referral for pain management. Called pt back to get more information regarding exactly what she was requesting lvm asking for return call.[name]'

## 2015-01-21 ENCOUNTER — Other Ambulatory Visit: Payer: Self-pay | Admitting: Physician Assistant

## 2015-02-04 DIAGNOSIS — G5601 Carpal tunnel syndrome, right upper limb: Secondary | ICD-10-CM | POA: Diagnosis not present

## 2015-02-04 DIAGNOSIS — G629 Polyneuropathy, unspecified: Secondary | ICD-10-CM | POA: Diagnosis not present

## 2015-02-04 DIAGNOSIS — M5416 Radiculopathy, lumbar region: Secondary | ICD-10-CM | POA: Diagnosis not present

## 2015-02-11 ENCOUNTER — Encounter: Payer: Self-pay | Admitting: Family Medicine

## 2015-02-11 DIAGNOSIS — G5601 Carpal tunnel syndrome, right upper limb: Secondary | ICD-10-CM | POA: Insufficient documentation

## 2015-02-11 DIAGNOSIS — G629 Polyneuropathy, unspecified: Secondary | ICD-10-CM | POA: Insufficient documentation

## 2015-02-18 DIAGNOSIS — M5416 Radiculopathy, lumbar region: Secondary | ICD-10-CM | POA: Diagnosis not present

## 2015-02-18 DIAGNOSIS — G6181 Chronic inflammatory demyelinating polyneuritis: Secondary | ICD-10-CM | POA: Diagnosis not present

## 2015-02-18 DIAGNOSIS — G629 Polyneuropathy, unspecified: Secondary | ICD-10-CM | POA: Diagnosis not present

## 2015-02-18 DIAGNOSIS — R200 Anesthesia of skin: Secondary | ICD-10-CM | POA: Diagnosis not present

## 2015-02-22 DIAGNOSIS — M62830 Muscle spasm of back: Secondary | ICD-10-CM | POA: Diagnosis not present

## 2015-02-22 DIAGNOSIS — Z79891 Long term (current) use of opiate analgesic: Secondary | ICD-10-CM | POA: Diagnosis not present

## 2015-02-22 DIAGNOSIS — M5136 Other intervertebral disc degeneration, lumbar region: Secondary | ICD-10-CM | POA: Diagnosis not present

## 2015-02-22 DIAGNOSIS — M47816 Spondylosis without myelopathy or radiculopathy, lumbar region: Secondary | ICD-10-CM | POA: Diagnosis not present

## 2015-02-22 DIAGNOSIS — G894 Chronic pain syndrome: Secondary | ICD-10-CM | POA: Diagnosis not present

## 2015-03-01 ENCOUNTER — Other Ambulatory Visit: Payer: Self-pay | Admitting: *Deleted

## 2015-03-01 MED ORDER — METFORMIN HCL 500 MG PO TABS: 500.0000 mg | ORAL_TABLET | Freq: Three times a day (TID) | ORAL | Status: DC

## 2015-03-08 ENCOUNTER — Other Ambulatory Visit: Payer: Self-pay | Admitting: Family Medicine

## 2015-03-31 DIAGNOSIS — E119 Type 2 diabetes mellitus without complications: Secondary | ICD-10-CM | POA: Diagnosis not present

## 2015-03-31 DIAGNOSIS — G5601 Carpal tunnel syndrome, right upper limb: Secondary | ICD-10-CM | POA: Diagnosis not present

## 2015-03-31 DIAGNOSIS — G6181 Chronic inflammatory demyelinating polyneuritis: Secondary | ICD-10-CM | POA: Diagnosis not present

## 2015-04-07 ENCOUNTER — Other Ambulatory Visit: Payer: Self-pay | Admitting: Family Medicine

## 2015-04-27 DIAGNOSIS — E119 Type 2 diabetes mellitus without complications: Secondary | ICD-10-CM | POA: Diagnosis not present

## 2015-04-27 DIAGNOSIS — M5136 Other intervertebral disc degeneration, lumbar region: Secondary | ICD-10-CM | POA: Diagnosis not present

## 2015-04-27 DIAGNOSIS — M5137 Other intervertebral disc degeneration, lumbosacral region: Secondary | ICD-10-CM | POA: Diagnosis not present

## 2015-04-27 DIAGNOSIS — M4806 Spinal stenosis, lumbar region: Secondary | ICD-10-CM | POA: Diagnosis not present

## 2015-05-03 ENCOUNTER — Ambulatory Visit (INDEPENDENT_AMBULATORY_CARE_PROVIDER_SITE_OTHER): Payer: Medicare Other | Admitting: Family Medicine

## 2015-05-03 ENCOUNTER — Encounter: Payer: Self-pay | Admitting: Family Medicine

## 2015-05-03 VITALS — BP 160/74 | HR 62 | Wt 195.0 lb

## 2015-05-03 DIAGNOSIS — Z23 Encounter for immunization: Secondary | ICD-10-CM

## 2015-05-03 DIAGNOSIS — R1030 Lower abdominal pain, unspecified: Secondary | ICD-10-CM | POA: Diagnosis not present

## 2015-05-03 DIAGNOSIS — I701 Atherosclerosis of renal artery: Secondary | ICD-10-CM | POA: Diagnosis not present

## 2015-05-03 DIAGNOSIS — G5792 Unspecified mononeuropathy of left lower limb: Secondary | ICD-10-CM | POA: Diagnosis not present

## 2015-05-03 DIAGNOSIS — R1031 Right lower quadrant pain: Secondary | ICD-10-CM

## 2015-05-03 DIAGNOSIS — G5793 Unspecified mononeuropathy of bilateral lower limbs: Secondary | ICD-10-CM

## 2015-05-03 DIAGNOSIS — G5791 Unspecified mononeuropathy of right lower limb: Secondary | ICD-10-CM | POA: Diagnosis not present

## 2015-05-03 DIAGNOSIS — E1140 Type 2 diabetes mellitus with diabetic neuropathy, unspecified: Secondary | ICD-10-CM | POA: Diagnosis not present

## 2015-05-03 LAB — POCT GLYCOSYLATED HEMOGLOBIN (HGB A1C): Hemoglobin A1C: 5.9

## 2015-05-03 MED ORDER — PROMETHAZINE HCL 25 MG PO TABS: 25.0000 mg | ORAL_TABLET | Freq: Three times a day (TID) | ORAL | Status: DC | PRN

## 2015-05-03 NOTE — Progress Notes (Signed)
Subjective:    Patient ID: [name], female    DOB: 22-Dec-1945, 69 y.o.   MRN: [id number]  HPI Diabetes pt reports that her BS in the morning have been low. her readings have been btw 40-50's she stated that when she took it this AM it was 90. she is only taking 25 units of lantus. Says it has actually woken her up. She has been eating at bedtime to keep her sugar up during the nightime.  Says she can eat anything she wants. Using 25 units of lantus.   She will be getting some type of infusion for her neuropahty in her lower legs. Says it can cause nausea. She still has 10 tabs on phenergan left from April. She wanted to know if she could have a refill if needed if this new medication which is an effusion causes some nausea.    History of right common iliac aneurysm - Says right goin feels achey adn pulling sensation when she has been up and moving.  Has some known hip OA as well.  Last ultrasound of the aneurysm was back in February, approximately 7 months ago. At that point in time it was 2.1 x 2.1 cm. With the achiness that she's been having she just wanted to make sure it was not the aneurysm. She has not noticed any bulging or swelling.   Review of Systems  BP 160/74 mmHg  Pulse 62  Wt 195 lb (88.451 kg)    Allergies  Allergen Reactions  . Varenicline Nausea And Vomiting    Past Medical History  Diagnosis Date  . Hypertension   . Hyperlipidemia   . PUD (peptic ulcer disease)   . Charcot-Marie-Tooth disease   . Arthritis   . Neurogenic bladder   . Diabetic peripheral neuropathy   . Osteopenia   . Post-menopausal   . Insomnia   . COPD (chronic obstructive pulmonary disease)   . DDD (degenerative disc disease)     Lumbar and lumbosacral  . Tobacco dependence   . Thyroid disease     hypo  . Gastric ulcer     w/o hemorrhage  . Diabetes mellitus     type 2  . Depression   . Obesity   . Unspecified hereditary and idiopathic peripheral neuropathy   . Lumbosacral  root lesions, not elsewhere classified   . Other symptoms referable to back   . Thoracic spondylosis without myelopathy   . Facet syndrome, lumbar   . Spinal stenosis, lumbar region, without neurogenic claudication     Past Surgical History  Procedure Laterality Date  . Cholecystectomy    . Replacement total knee    . Fluoroscopic l5 dorsal medial branch bloc    . Bilateral l3 medial branch block    . Fibroid tumor removal    . Renal artery stent  11/28/2010    left  . Ankle reconstruction      left ankle, plate and pins     Social History   Social History  . Marital Status: Widowed    Spouse Name: N/A  . Number of Children: N/A  . Years of Education: N/A   Occupational History  . Not on file.   Social History Main Topics  . Smoking status: Current Every Day Smoker -- 1.00 packs/day for 49 years    Types: Cigarettes  . Smokeless tobacco: Never Used     Comment: pt states she is going to try the E-cig  . Alcohol Use: No  .  Drug Use: No  . Sexual Activity: Not on file   Other Topics Concern  . Not on file   Social History Narrative    Family History  Problem Relation Age of Onset  . Stroke Mother   . Heart disease Father 72    heart attack  . Hypertension Father   . Cancer Father     lung  . Diabetes Paternal Aunt     insulin dependent    Outpatient Encounter Prescriptions as of 05/03/2015  Medication Sig  . alendronate (FOSAMAX) 70 MG tablet TAKE 1 TAB EVERY 7 DAYS. TAKE IN THE MORNING WITH A FULL GLASS OF WATER ON AN EMPTY STOMACH.  DO NOT INGEST ANY OTHER FOOD OR DRINK OR LIE D  . aspirin EC 81 MG tablet Take 81 mg by mouth daily.  . clopidogrel (PLAVIX) 75 MG tablet TAKE 1 TABLET DAILY  . dicyclomine (BENTYL) 20 MG tablet Take 1 tablet (20 mg total) by mouth 3 (three) times daily as needed for spasms.  . furosemide (LASIX) 40 MG tablet TAKE 1 OR 2 TABLETS EVERY MORNING.  Marland Kitchen gabapentin (NEURONTIN) 400 MG capsule TAKE ONE CAPSULE THREE TIMES DAILY AS  NEEDED  . glipiZIDE (GLUCOTROL) 5 MG tablet Take one tablet before breakfast  . glucose blood test strip 1 each as needed. Use as instructed   . HYDROcodone-acetaminophen (NORCO) 10-325 MG per tablet Take 1 tablet by mouth every 6 (six) hours as needed. Do not take more that 5 tabs a day  . INCRUSE ELLIPTA 62.5 MCG/INH AEPB   . Lancets MISC by Does not apply route.    Marland Kitchen LANTUS SOLOSTAR 100 UNIT/ML Solostar Pen inject 48 UNITS daily (Patient taking differently: inject 48 UNITS daily. pt reports she has been injecting 25 units)  . levothyroxine (SYNTHROID, LEVOTHROID) 100 MCG tablet TAKE ONE TABLET DAILY BEFORE BREAKFAST ON AN EMPTY STOMACH.  . metFORMIN (GLUCOPHAGE) 500 MG tablet Take 1 tablet (500 mg total) by mouth 3 (three) times daily.  . metoprolol (LOPRESSOR) 100 MG tablet Take 100 mg by mouth 2 (two) times daily.  . nitroGLYCERIN (NITROSTAT) 0.4 MG SL tablet Place 1 tablet (0.4 mg total) under the tongue every 5 (five) minutes as needed.  . pantoprazole (PROTONIX) 40 MG tablet Take 1 tablet (40 mg total) by mouth daily.  . promethazine (PHENERGAN) 25 MG tablet Take 1 tablet (25 mg total) by mouth every 8 (eight) hours as needed for nausea or vomiting.  . simvastatin (ZOCOR) 40 MG tablet TAKE ONE TABLET DAILY AT BEDTIME  . tiotropium (SPIRIVA) 18 MCG inhalation capsule Place 1 capsule (18 mcg total) into inhaler and inhale daily.  . VENTOLIN HFA 108 (90 BASE) MCG/ACT inhaler Inhale 2 puffs into the lungs every 6 (six) hours as needed for wheezi ng.  . zolpidem (AMBIEN) 5 MG tablet TAKE ONE TABLET DAILY AT BEDTIME AS NEEDED FOR SLEEP  . [DISCONTINUED] AMBULATORY NON FORMULARY MEDICATION Medication Name: Rollator Walker with seat. Fax to [phone]  . [DISCONTINUED] promethazine (PHENERGAN) 25 MG tablet Take 1 tablet (25 mg total) by mouth every 8 (eight) hours as needed for nausea or vomiting.  . [DISCONTINUED] clopidogrel (PLAVIX) 75 MG tablet Take 1 tablet (75 mg total) by mouth daily.  .  [DISCONTINUED] simvastatin (ZOCOR) 40 MG tablet TAKE 1 AT BEDTIME   No facility-administered encounter medications on file as of 05/03/2015.          Objective:   Physical Exam  Constitutional: She is oriented to person, place,  and time. She appears well-developed and well-nourished.  HENT:  Head: Normocephalic and atraumatic.  Cardiovascular: Normal rate, regular rhythm and normal heart sounds.   Pulmonary/Chest: Effort normal and breath sounds normal.  Musculoskeletal:  Right hip with normal flexion. Some limited rotation internally and externally but no significant discomfort with internal and external rotation. In fact it only causes discomfort in her back. She was tender over the hip flexor tendon. Unable to palpate any bulge or swelling.  Neurological: She is alert and oriented to person, place, and time.  Skin: Skin is warm and dry.  Psychiatric: She has a normal mood and affect. Her behavior is normal.          Assessment & Plan:  DM -  Well controlled, though with hypoglycemic events.will decrease lantus to 15 units.  A1c is down to 5.9 well controlled.  Calls in about a week and let us know if blood sugars are doing so that we can make a final adjustment over the phone if needed. She has lost some weight over the last 4 months of this certainly could be contributing to a decrease in insulin resistance endoscopic hypoglycemic events. She's also on glipizide which certainly puts her wrist but she's been on this for years without any problems.   Right groin pain-most likely related to hip osteoarthritis. Her ultrasound-guided February has was stable. Though if the pain continues or gets worse encouraged her to call us back and always get another Doppler if needed just to make sure is no change in aneurysm itself.  Bilat neuropathy - will start new infusion,  Gamunex. Ok to refill phenergan if needed.

## 2015-05-04 LAB — BASIC METABOLIC PANEL
BUN: 14 mg/dL (ref 7–25)
CHLORIDE: 98 mmol/L (ref 98–110)
CO2: 30 mmol/L (ref 20–31)
CREATININE: 0.92 mg/dL (ref 0.60–0.93)
Calcium: 10.2 mg/dL (ref 8.6–10.4)
Glucose, Bld: 169 mg/dL — ABNORMAL HIGH (ref 65–99)
Potassium: 4 mmol/L (ref 3.5–5.3)
Sodium: 140 mmol/L (ref 135–146)

## 2015-05-04 NOTE — Progress Notes (Signed)
Quick Note:  All labs are normal. ______ 

## 2015-05-10 ENCOUNTER — Ambulatory Visit (INDEPENDENT_AMBULATORY_CARE_PROVIDER_SITE_OTHER): Payer: Medicare Other | Admitting: Family Medicine

## 2015-05-10 ENCOUNTER — Encounter: Payer: Self-pay | Admitting: Family Medicine

## 2015-05-10 VITALS — BP 167/69 | HR 82 | Wt 195.0 lb

## 2015-05-10 DIAGNOSIS — I701 Atherosclerosis of renal artery: Secondary | ICD-10-CM | POA: Diagnosis not present

## 2015-05-10 DIAGNOSIS — R21 Rash and other nonspecific skin eruption: Secondary | ICD-10-CM

## 2015-05-10 MED ORDER — VALACYCLOVIR HCL 1 G PO TABS: 1000.0000 mg | ORAL_TABLET | Freq: Every day | ORAL | Status: DC

## 2015-05-10 MED ORDER — LIDOCAINE 5 % EX OINT: 1.0000 "application " | TOPICAL_OINTMENT | Freq: Three times a day (TID) | CUTANEOUS | Status: DC | PRN

## 2015-05-10 NOTE — Progress Notes (Signed)
   Subjective:    Patient ID: [name], female    DOB: [birth year], 70 y.o.   MRN: [id number]  HPI 4 days of low back pain and then felt some bumps today and called this morning.  Felt like she saw blood and pus on her fingers. She did scratch at it.  Says even her pain medicine isn't touching it.     Review of Systems     Objective:   Physical Exam  Constitutional: She appears well-developed and well-nourished.  HENT:  Head: Normocephalic and atraumatic.  Skin: Skin is warm and dry.  She has 3 small lesion in a cluster on her right upper buttock cheek. 2 are excoriated. One has a white pustule on it.   Psychiatric: She has a normal mood and affect. Her behavior is normal.          Assessment & Plan:  Rash - Most consistant with HSV.  Viral culture was obtained.  Will tx with valtrex and lidocaine gel.  Call if not better in one week.

## 2015-05-13 DIAGNOSIS — Z79891 Long term (current) use of opiate analgesic: Secondary | ICD-10-CM | POA: Diagnosis not present

## 2015-05-13 DIAGNOSIS — M47816 Spondylosis without myelopathy or radiculopathy, lumbar region: Secondary | ICD-10-CM | POA: Diagnosis not present

## 2015-05-13 DIAGNOSIS — M62830 Muscle spasm of back: Secondary | ICD-10-CM | POA: Diagnosis not present

## 2015-05-13 DIAGNOSIS — G894 Chronic pain syndrome: Secondary | ICD-10-CM | POA: Diagnosis not present

## 2015-05-13 DIAGNOSIS — M5136 Other intervertebral disc degeneration, lumbar region: Secondary | ICD-10-CM | POA: Diagnosis not present

## 2015-05-17 ENCOUNTER — Other Ambulatory Visit: Payer: Self-pay | Admitting: Family Medicine

## 2015-05-18 ENCOUNTER — Encounter: Payer: Self-pay | Admitting: Family Medicine

## 2015-05-18 ENCOUNTER — Other Ambulatory Visit: Payer: Self-pay | Admitting: Family Medicine

## 2015-05-18 DIAGNOSIS — M5416 Radiculopathy, lumbar region: Secondary | ICD-10-CM | POA: Diagnosis not present

## 2015-05-18 DIAGNOSIS — G629 Polyneuropathy, unspecified: Secondary | ICD-10-CM | POA: Diagnosis not present

## 2015-05-18 DIAGNOSIS — G5601 Carpal tunnel syndrome, right upper limb: Secondary | ICD-10-CM | POA: Diagnosis not present

## 2015-05-18 DIAGNOSIS — G6181 Chronic inflammatory demyelinating polyneuritis: Secondary | ICD-10-CM | POA: Diagnosis not present

## 2015-05-19 ENCOUNTER — Other Ambulatory Visit: Payer: Self-pay | Admitting: *Deleted

## 2015-05-20 DIAGNOSIS — B009 Herpesviral infection, unspecified: Secondary | ICD-10-CM | POA: Insufficient documentation

## 2015-05-20 LAB — HERPES SIMPLEX VIRUS CULTURE: ORGANISM ID, BACTERIA: DETECTED

## 2015-05-28 ENCOUNTER — Telehealth: Payer: Self-pay

## 2015-05-28 MED ORDER — FLUOXETINE HCL 10 MG PO CAPS: ORAL_CAPSULE | ORAL | Status: DC

## 2015-05-28 MED ORDER — TIOTROPIUM BROMIDE MONOHYDRATE 18 MCG IN CAPS: 18.0000 ug | ORAL_CAPSULE | Freq: Every day | RESPIRATORY_TRACT | Status: DC

## 2015-05-28 NOTE — Telephone Encounter (Signed)
I sent over a new script.  Have her f/u with me in about 4 weeks.

## 2015-05-28 NOTE — Telephone Encounter (Signed)
Patient informed. Ruffin Lada,CMA  

## 2015-05-28 NOTE — Telephone Encounter (Signed)
Patient called stated that she needs something for her nerves. She stated that she cannot stop crying from having to watch her daughter deteriorate  for the last year.please advise patient.Vanna Shavers,CMA

## 2015-05-31 ENCOUNTER — Other Ambulatory Visit: Payer: Self-pay | Admitting: Family Medicine

## 2015-06-28 ENCOUNTER — Other Ambulatory Visit: Payer: Self-pay | Admitting: Family Medicine

## 2015-07-09 ENCOUNTER — Ambulatory Visit (INDEPENDENT_AMBULATORY_CARE_PROVIDER_SITE_OTHER): Payer: Medicare Other | Admitting: Family Medicine

## 2015-07-09 ENCOUNTER — Encounter: Payer: Self-pay | Admitting: Family Medicine

## 2015-07-09 VITALS — BP 129/57 | HR 58 | Temp 98.2°F | Resp 15 | Wt 180.7 lb

## 2015-07-09 DIAGNOSIS — I701 Atherosclerosis of renal artery: Secondary | ICD-10-CM | POA: Diagnosis not present

## 2015-07-09 DIAGNOSIS — F418 Other specified anxiety disorders: Secondary | ICD-10-CM | POA: Diagnosis not present

## 2015-07-09 MED ORDER — FLUOXETINE HCL 40 MG PO CAPS: 40.0000 mg | ORAL_CAPSULE | Freq: Every day | ORAL | Status: DC

## 2015-07-09 NOTE — Progress Notes (Signed)
   Subjective:    Patient ID: [name], female    DOB: 1946/03/23, 69 y.o.   MRN: [id number]  HPI Patient comes in today complaining of increased anxiety and depression. Her daughter has some type of cancer and unfortunately they have not been able to clearly identify the tumor. She has significantly elevated gastrin levels and a second marker that she wasn't clear on. She continues to lose weight and is now under 90 pounds. She said that she wakes up in the morning and gets ready and makes her bed and then sits all day and thinks about her daughter and worries about her. She's tearful frequently. She really doesn't want friends and neighbors coming around because she doesn't want him to see her cry. She says she's not interested in talking with anyone touches a therapist or counselor at this time. She would be interested in trying to adjust her medication though. She says this is what the hardest things that she has done in her life. She is sleeping okay. In fact she's actually been sleeping more than she usually does. She knows it's because of low mood.  She does complain of feeling nervous and on edge more than half the days and feeling afraid something awful might happen every day. She also complains of feeling down and depressed every day as well as excess fatigue and difficulty concentrating.   Review of Systems     Objective:   Physical Exam  Constitutional: She is oriented to person, place, and time. She appears well-developed and well-nourished.  HENT:  Head: Normocephalic and atraumatic.  Eyes: Conjunctivae and EOM are normal.  Cardiovascular: Normal rate.   Pulmonary/Chest: Effort normal.  Neurological: She is alert and oriented to person, place, and time.  Skin: Skin is dry. No pallor.  Psychiatric: She has a normal mood and affect. Her behavior is normal.  Vitals reviewed.         Assessment & Plan:  Depression/anxiety-gad 7 score of 15 and PHQ 9 score of  15-discussed options. She declines any therapy or counseling. We'll go ahead and increase fluoxetine to 40 mg. Encouraged her to do something daily besides just sit and think and worry. She loves to read and I think that would be a great outlet for her. Follow-up in 3-4 weeks to make sure that she's doing well and tolerating the medication well. She rates her symptoms as very difficult.  Time spent 35 minutes, greater 50% time spent counseling about depression and anxiety.

## 2015-07-16 ENCOUNTER — Other Ambulatory Visit: Payer: Self-pay | Admitting: Family Medicine

## 2015-07-21 ENCOUNTER — Other Ambulatory Visit: Payer: Self-pay | Admitting: Family Medicine

## 2015-07-21 DIAGNOSIS — G5601 Carpal tunnel syndrome, right upper limb: Secondary | ICD-10-CM | POA: Diagnosis not present

## 2015-07-21 DIAGNOSIS — M5416 Radiculopathy, lumbar region: Secondary | ICD-10-CM | POA: Diagnosis not present

## 2015-07-21 DIAGNOSIS — G6181 Chronic inflammatory demyelinating polyneuritis: Secondary | ICD-10-CM | POA: Diagnosis not present

## 2015-07-21 DIAGNOSIS — F321 Major depressive disorder, single episode, moderate: Secondary | ICD-10-CM | POA: Diagnosis not present

## 2015-07-26 DIAGNOSIS — M47816 Spondylosis without myelopathy or radiculopathy, lumbar region: Secondary | ICD-10-CM | POA: Diagnosis not present

## 2015-07-26 DIAGNOSIS — Z79891 Long term (current) use of opiate analgesic: Secondary | ICD-10-CM | POA: Diagnosis not present

## 2015-07-26 DIAGNOSIS — M5136 Other intervertebral disc degeneration, lumbar region: Secondary | ICD-10-CM | POA: Diagnosis not present

## 2015-07-26 DIAGNOSIS — M62830 Muscle spasm of back: Secondary | ICD-10-CM | POA: Diagnosis not present

## 2015-07-26 DIAGNOSIS — G894 Chronic pain syndrome: Secondary | ICD-10-CM | POA: Diagnosis not present

## 2015-08-04 ENCOUNTER — Other Ambulatory Visit: Payer: Self-pay | Admitting: Family Medicine

## 2015-08-16 ENCOUNTER — Other Ambulatory Visit: Payer: Self-pay | Admitting: Family Medicine

## 2015-08-16 MED ORDER — FLUOXETINE HCL 40 MG PO CAPS: 80.0000 mg | ORAL_CAPSULE | Freq: Every day | ORAL | Status: DC

## 2015-09-01 ENCOUNTER — Encounter: Payer: Self-pay | Admitting: Family Medicine

## 2015-09-01 ENCOUNTER — Ambulatory Visit (INDEPENDENT_AMBULATORY_CARE_PROVIDER_SITE_OTHER): Payer: Medicare Other | Admitting: Family Medicine

## 2015-09-01 VITALS — BP 153/54 | HR 58 | Wt 185.0 lb

## 2015-09-01 DIAGNOSIS — G4700 Insomnia, unspecified: Secondary | ICD-10-CM

## 2015-09-01 DIAGNOSIS — K219 Gastro-esophageal reflux disease without esophagitis: Secondary | ICD-10-CM | POA: Diagnosis not present

## 2015-09-01 DIAGNOSIS — F411 Generalized anxiety disorder: Secondary | ICD-10-CM | POA: Diagnosis not present

## 2015-09-01 DIAGNOSIS — Z1159 Encounter for screening for other viral diseases: Secondary | ICD-10-CM

## 2015-09-01 DIAGNOSIS — F331 Major depressive disorder, recurrent, moderate: Secondary | ICD-10-CM

## 2015-09-01 DIAGNOSIS — E1140 Type 2 diabetes mellitus with diabetic neuropathy, unspecified: Secondary | ICD-10-CM

## 2015-09-01 LAB — LIPID PANEL
CHOLESTEROL: 128 mg/dL (ref 125–200)
HDL: 42 mg/dL — ABNORMAL LOW (ref >=46)
LDL Cholesterol: 61 mg/dL (ref <130)
TRIGLYCERIDES: 124 mg/dL (ref <150)
Total CHOL/HDL Ratio: 3 Ratio (ref <=5.0)
VLDL: 25 mg/dL (ref <30)

## 2015-09-01 LAB — COMPLETE METABOLIC PANEL WITH GFR
ALT: 7 U/L (ref 6–29)
AST: 12 U/L (ref 10–35)
Albumin: 3.4 g/dL — ABNORMAL LOW (ref 3.6–5.1)
Alkaline Phosphatase: 51 U/L (ref 33–130)
BILIRUBIN TOTAL: 1.3 mg/dL — AB (ref 0.2–1.2)
BUN: 14 mg/dL (ref 7–25)
CALCIUM: 9 mg/dL (ref 8.6–10.4)
CO2: 33 mmol/L — ABNORMAL HIGH (ref 20–31)
CREATININE: 1.04 mg/dL — AB (ref 0.60–0.93)
Chloride: 92 mmol/L — ABNORMAL LOW (ref 98–110)
GFR, EST AFRICAN AMERICAN: 62 mL/min (ref >=60)
GFR, Est Non African American: 54 mL/min — ABNORMAL LOW (ref >=60)
Glucose, Bld: 114 mg/dL — ABNORMAL HIGH (ref 65–99)
Potassium: 4 mmol/L (ref 3.5–5.3)
Sodium: 139 mmol/L (ref 135–146)
TOTAL PROTEIN: 6.3 g/dL (ref 6.1–8.1)

## 2015-09-01 MED ORDER — SUVOREXANT 10 MG PO TABS: 1.0000 | ORAL_TABLET | Freq: Every day | ORAL | Status: DC

## 2015-09-01 MED ORDER — FLUOXETINE HCL 40 MG PO CAPS: 80.0000 mg | ORAL_CAPSULE | Freq: Every day | ORAL | Status: DC

## 2015-09-01 NOTE — Patient Instructions (Addendum)
Increase Protonix to twice a day. Take one in AM about 30 min before the first meal of the day and then take one at bedtime.   If the reflux is not better in 2 weeks then let me know.

## 2015-09-01 NOTE — Progress Notes (Signed)
   Subjective:    Patient ID: [name], female    DOB: 08/05/1946, 70 y.o.   MRN: [id number]  HPI Follow-up depression and anxiety.  She has had a lot of stressors. Her daughter has cancer they've been unable to identify the type. She says she sits around and constantly worries. I saw her about 6 weeks ago we decided to increase the fluoxetine to 40 mg. She was not interested in working with a English as a second language teacher.  When I saw her last time we increased her fluoxetine. Overall she does feel like she is doing better. She denies feeling down or depressed but still feels like she is worrying some. She has not had any side effects with increase the medication. Next  Patient fell a week ago on her bottom. She was trying to move a reclining chair and fell backwards and landed on her bottom. She did not reach outer leg on her hands or arms. She now has a knot at the base of the right buttock cheek that she wants me to take a look at. Very sore to sit on. She doesn't think she hit her tailbone. She has had some increased back discomfort but not too bothersome. She said she has a large bruise across her bottom. Next  Insomnia-she's still not sleeping well. She takes 5 mg of Ambien and is up to 40 mg and melatonin. She tries to go to bed around the same time, partly 9 PM gets up at different times in the morning. She does have the TV on in her bedroom but not the picture just the sound when she turns down. She said she's done that for years ever since her husband passed away. She also has an animal that stays in the bedroom with her as well.   Review of Systems     Objective:   Physical Exam  Constitutional: She is oriented to person, place, and time. She appears well-developed and well-nourished.  HENT:  Head: Normocephalic and atraumatic.  Cardiovascular: Normal rate, regular rhythm and normal heart sounds.   Pulmonary/Chest: Effort normal and breath sounds normal.  Neurological: She is alert  and oriented to person, place, and time.  Skin: Skin is warm and dry.  Psychiatric: She has a normal mood and affect. Her behavior is normal.          Assessment & Plan:  Depression/anxiety-7 score of  1  and PHQ 9 was negative. She did mark that she was having difficulty with sleep low energy and overeating. Continue current regimen. He feels like this has made a difference has been helpful. Her daughter was here with her today. Next  Insomnia-she may be a good candidate to try a newer medication: Belsomra to see if it's more effective than it has a cleaner side effect profile. We'll try to send over a prescription to her pharmacy.  Contusion/hematoma-she does have a fairly large approximately walnut sized hematoma in the right buttock cheek. Discussed treatment. Recommend that she work on massaging the area so it doesn't hard and calcified.

## 2015-09-02 LAB — HEPATITIS C ANTIBODY: HCV Ab: NEGATIVE

## 2015-09-06 ENCOUNTER — Other Ambulatory Visit: Payer: Self-pay | Admitting: Family Medicine

## 2015-09-10 ENCOUNTER — Encounter: Payer: Self-pay | Admitting: Family

## 2015-09-13 ENCOUNTER — Other Ambulatory Visit: Payer: Self-pay | Admitting: Family Medicine

## 2015-09-16 ENCOUNTER — Other Ambulatory Visit: Payer: Self-pay | Admitting: *Deleted

## 2015-09-16 ENCOUNTER — Encounter: Payer: Self-pay | Admitting: Family Medicine

## 2015-09-16 ENCOUNTER — Ambulatory Visit (INDEPENDENT_AMBULATORY_CARE_PROVIDER_SITE_OTHER): Payer: Medicare Other | Admitting: Family Medicine

## 2015-09-16 VITALS — BP 129/67 | HR 49 | Wt 178.0 lb

## 2015-09-16 DIAGNOSIS — G4700 Insomnia, unspecified: Secondary | ICD-10-CM | POA: Diagnosis not present

## 2015-09-16 DIAGNOSIS — H6122 Impacted cerumen, left ear: Secondary | ICD-10-CM

## 2015-09-16 DIAGNOSIS — I701 Atherosclerosis of renal artery: Secondary | ICD-10-CM

## 2015-09-16 DIAGNOSIS — N1832 Chronic kidney disease, stage 3b: Secondary | ICD-10-CM

## 2015-09-16 DIAGNOSIS — R29818 Other symptoms and signs involving the nervous system: Secondary | ICD-10-CM

## 2015-09-16 DIAGNOSIS — N183 Chronic kidney disease, stage 3 (moderate): Secondary | ICD-10-CM

## 2015-09-16 DIAGNOSIS — E1140 Type 2 diabetes mellitus with diabetic neuropathy, unspecified: Secondary | ICD-10-CM | POA: Diagnosis not present

## 2015-09-16 DIAGNOSIS — I723 Aneurysm of iliac artery: Secondary | ICD-10-CM

## 2015-09-16 DIAGNOSIS — R2689 Other abnormalities of gait and mobility: Secondary | ICD-10-CM

## 2015-09-16 LAB — POCT UA - MICROALBUMIN
CREATININE, POC: 50 mg/dL
Microalbumin Ur, POC: 10 mg/L

## 2015-09-16 LAB — POCT GLYCOSYLATED HEMOGLOBIN (HGB A1C): Hemoglobin A1C: 5.6

## 2015-09-16 MED ORDER — GLIPIZIDE 5 MG PO TABS: ORAL_TABLET | ORAL | Status: DC

## 2015-09-16 NOTE — Progress Notes (Addendum)
   Subjective:    Patient ID: [name], female    DOB: [birth year], 70 y.o.   MRN: [id number]  HPI Diabetes - no hypoglycemic events. No wounds or sores that are not healing well. No increased thirst or urination. Checking glucose at home. Taking medications as prescribed without any side effects. Needs refill on the glipizide.  She is on 15 units of Lantus.    Would ike refill on the promethazine she gets occasional diarrhea with stomach cramps and says when the cramps become more severe she gets nauseated and likes to be able to take the Phenergan as needed.  Insomnia - says doesn't want to come off her ambien to try the Belsomra.  Says her sleep has been better on its on.    Her balance seem to be getting worse over the last 3 weeks.  Has been falling more often. She uses a walker.  Occ gets presssue in her ears but usually brief  . She denies dizziness but just feels like her balance is off. She said she'll get up to start walking and will start swaying to one side. She actually fell couple weeks ago on her coffee table. Wonders if her ear is getting clogged with wax and affect to be causing it. She gets occasional cerumen impactions.   Review of Systems     Objective:   Physical Exam  Constitutional: She is oriented to person, place, and time. She appears well-developed and well-nourished.  HENT:  Head: Normocephalic and atraumatic.  Right Ear: External ear normal.  Left Ear: External ear normal.  Nose: Nose normal.  Mouth/Throat: Oropharynx is clear and moist.  Left canal impacted with cerumen. Right canal is clear.   Eyes: Conjunctivae and EOM are normal. Pupils are equal, round, and reactive to light.  Neck: Neck supple. No thyromegaly present.  Cardiovascular: Normal rate, regular rhythm and normal heart sounds.   Pulmonary/Chest: Effort normal and breath sounds normal. She has no wheezes.  Lymphadenopathy:    She has no cervical adenopathy.  Neurological: She is alert  and oriented to person, place, and time.  Skin: Skin is warm and dry.  Psychiatric: She has a normal mood and affect.          Assessment & Plan:  DM - decrease lantus to 10 units at bedtime. She is doing well. Her A1c looks fantastic at 5.6. Monitor for any hypoglycemia.  Insomnia - okay to continue with Ambien for now. She does not want to try the [place]. I did encourage her to decrease her dose on her melatonin. She's currently taking 40 mg. I explained that I think this is too much and would encourage her to try to drop back down to 10 mg.  Imbalance-unclear etiology. She does have a cerumen impaction in the left ear. We will irrigate that today. I'm also going to decrease her insulin to try to avoid any hypoglycemic events. And we are going to reduce her melatonin all of which could be contributing to balance and dizzy issues.  If she is not noticing any improvement over the next week or 2 then she'll call me back.  CKD 3 - urine micro is positive today. Cr stable  Lab Results  Component Value Date   CREATININE 1.04* 09/01/2015

## 2015-09-16 NOTE — Patient Instructions (Signed)
Decrease Lantus to 10 units. Make sure drinking plenty of fluid each day. Decrease the melatonin to 10 mg.

## 2015-09-17 ENCOUNTER — Ambulatory Visit (HOSPITAL_COMMUNITY)
Admission: RE | Admit: 2015-09-17 | Discharge: 2015-09-17 | Disposition: A | Discharge status: Home / Self Care | Payer: Medicare Other | Source: Ambulatory Visit | Attending: Family | Admitting: Family

## 2015-09-17 ENCOUNTER — Ambulatory Visit (INDEPENDENT_AMBULATORY_CARE_PROVIDER_SITE_OTHER)
Admission: RE | Admit: 2015-09-17 | Discharge: 2015-09-17 | Disposition: A | Discharge status: Home / Self Care | Payer: Medicare Other | Source: Ambulatory Visit | Attending: Vascular Surgery | Admitting: Vascular Surgery

## 2015-09-17 ENCOUNTER — Ambulatory Visit (INDEPENDENT_AMBULATORY_CARE_PROVIDER_SITE_OTHER): Payer: Medicare Other | Admitting: Family

## 2015-09-17 ENCOUNTER — Encounter: Payer: Self-pay | Admitting: Family

## 2015-09-17 VITALS — BP 108/52 | HR 42 | Temp 98.1°F | Resp 12 | Ht 67.0 in | Wt 186.0 lb

## 2015-09-17 DIAGNOSIS — I701 Atherosclerosis of renal artery: Secondary | ICD-10-CM | POA: Diagnosis not present

## 2015-09-17 DIAGNOSIS — I723 Aneurysm of iliac artery: Secondary | ICD-10-CM

## 2015-09-17 DIAGNOSIS — Z720 Tobacco use: Secondary | ICD-10-CM | POA: Diagnosis not present

## 2015-09-17 DIAGNOSIS — F17200 Nicotine dependence, unspecified, uncomplicated: Secondary | ICD-10-CM

## 2015-09-17 DIAGNOSIS — I77811 Abdominal aortic ectasia: Secondary | ICD-10-CM | POA: Insufficient documentation

## 2015-09-17 DIAGNOSIS — Z959 Presence of cardiac and vascular implant and graft, unspecified: Secondary | ICD-10-CM | POA: Diagnosis not present

## 2015-09-17 NOTE — Patient Instructions (Addendum)
Renal Artery Stenosis °Renal artery stenosis (RAS) is narrowing of the artery that carries blood to your kidneys. It can affect one or both kidneys. °Your kidneys filter waste and extra fluid from your blood. You get rid of the waste and fluid when you urinate. Your kidneys also make an important chemical messenger (hormone) called renin. Renin helps regulate your blood pressure. The first sign of RAS may be high blood pressure. Over time, other symptoms can develop. °CAUSES  °Plaque buildup in your arteries (atherosclerosis) is the main cause of RAS. The plaques that cause this are made up of: °· Fat. °· Cholesterol. °· Calcium. °· Other substances. °As these substances build up in your renal artery, this slows the blood supply to your kidneys. The lack of blood and oxygen causes the signs and symptoms of RAS. °A much less common cause of RAS is a disease called fibromuscular dysplasia. This disease causes abnormal cell growth that narrows the renal artery. It is not related to atherosclerosis. It occurs mostly in women who are 25-50 years old. It may be passed down through families. °RISK FACTORS  °You may be at risk for renal artery stenosis if you: °· Are a man who is at least 71 years old. °· Are a woman who is at least 71 years old. °· Have high blood pressure. °· Have high cholesterol. °· Are a smoker. °· Abuse alcohol. °· Have diabetes or prediabetes. °· Are overweight. °· Have a family history of early heart disease. °SIGNS AND SYMPTOMS  °RAS usually develops slowly. You may not have any signs or symptoms at first. The earliest signs may be: °· Developing high blood pressure. °· A sudden increase in existing high blood pressure. °· No longer responding to medicine that used to control your blood pressure. °Later signs and symptoms are due to kidney damage. They may include: °· Fatigue. °· Shortness of breath. °· Swollen legs and feet. °· Dry skin. °· Headaches. °· Muscle cramps. °· Loss of  appetite. °· Nausea or vomiting. °DIAGNOSIS  °Your health care provider may suspect RAS based on changes in your blood pressure and your risk factors. A physical exam will be done. Your health care provider may use a stethoscope to listen for a whooshing sound (bruit) that can occur where the renal artery is blocking blood flow. Several tests may be done to confirm a diagnosis of RAS. These may include: °· Blood and urine tests to check your kidney function. °· Imaging tests of your kidneys, such as: °¨ A test that involves using sound waves to create an image of your kidneys and the blood flow to your kidneys (ultrasound). °¨ A test in which dye is injected into one of your blood vessels so images can be taken as the dye flows through your renal arteries (angiogram). These tests can be done using X-rays, a CT scan (computed tomography angiogram, CTA), or a type of MRI (magnetic resonance angiogram, MRA). °TREATMENT  °Making lifestyle changes to reduce your risk factors is the first treatment option for early RAS. If the blood flow to one of your kidneys is cut by more than half, you may need medicine to: °· Lower your blood pressure. This is the main medical treatment for RAS. You may need more than one type of medicine for this. The two types that work best for RAS are: °¨ ACE inhibitors. °¨ Angiotensin receptor blockers. °· Reduce fluid in the body (diuretics). °· Lower your cholesterol (statins). °If medicine is not enough   to control RAS, you may need surgery. This may involve:  Threading a tube with an inflatable balloon into the renal artery to force it open (angioplasty).  Removing plaque from inside the artery (endarterectomy). HOME CARE INSTRUCTIONS  Take medicines only as directed by your health care provider.  Make any lifestyle changes recommended by your health care provider. This may include:  Working with a dietitian to maintain a heart-healthy diet. This type of diet is low in saturated  fat, salt, and added sugar.  Starting an exercise program as directed by your health care provider.  Maintaining a healthy weight.  Quitting smoking.  Not abusing alcohol.  Keep all follow-up visits as directed by your health care provider. This is important. SEEK MEDICAL CARE IF:  Your symptoms of RAS are not getting better.  Your symptoms are changing or getting worse. SEEK IMMEDIATE MEDICAL CARE IF:  You have very bad pain in your back or abdomen.  You have blood in your urine.   This information is not intended to replace advice given to you by your health care provider. Make sure you discuss any questions you have with your health care provider.   Document Released: 04/19/2005 Document Revised: 08/14/2014 Document Reviewed: 11/06/2013 Elsevier Interactive Patient Education 2016 [employer].    Smoking Cessation, Tips for Success If you are ready to quit smoking, congratulations! You have chosen to help yourself be healthier. Cigarettes bring nicotine, tar, carbon monoxide, and other irritants into your body. Your lungs, heart, and blood vessels will be able to work better without these poisons. There are many different ways to quit smoking. Nicotine gum, nicotine patches, a nicotine inhaler, or nicotine nasal spray can help with physical craving. Hypnosis, support groups, and medicines help break the habit of smoking. WHAT THINGS CAN I DO TO MAKE QUITTING EASIER?  Here are some tips to help you quit for good:  Pick a date when you will quit smoking completely. Tell all of your friends and family about your plan to quit on that date.  Do not try to slowly cut down on the number of cigarettes you are smoking. Pick a quit date and quit smoking completely starting on that day.  Throw away all cigarettes.   Clean and remove all ashtrays from your home, work, and car.  On a card, write down your reasons for quitting. Carry the card with you and read it when you get the  urge to smoke.  Cleanse your body of nicotine. Drink enough water and fluids to keep your urine clear or pale yellow. Do this after quitting to flush the nicotine from your body.  Learn to predict your moods. Do not let a bad situation be your excuse to have a cigarette. Some situations in your life might tempt you into wanting a cigarette.  Never have "just one" cigarette. It leads to wanting another and another. Remind yourself of your decision to quit.  Change habits associated with smoking. If you smoked while driving or when feeling stressed, try other activities to replace smoking. Stand up when drinking your coffee. Brush your teeth after eating. Sit in a different chair when you read the paper. Avoid alcohol while trying to quit, and try to drink fewer caffeinated beverages. Alcohol and caffeine may urge you to smoke.  Avoid foods and drinks that can trigger a desire to smoke, such as sugary or spicy foods and alcohol.  Ask people who smoke not to smoke around you.  Have something planned  to do right after eating or having a cup of coffee. For example, plan to take a walk or exercise.  Try a relaxation exercise to calm you down and decrease your stress. Remember, you may be tense and nervous for the first 2 weeks after you quit, but this will pass.  Find new activities to keep your hands busy. Play with a pen, coin, or rubber band. Doodle or draw things on paper.  Brush your teeth right after eating. This will help cut down on the craving for the taste of tobacco after meals. You can also try mouthwash.   Use oral substitutes in place of cigarettes. Try using lemon drops, carrots, cinnamon sticks, or chewing gum. Keep them handy so they are available when you have the urge to smoke.  When you have the urge to smoke, try deep breathing.  Designate your home as a nonsmoking area.  If you are a heavy smoker, ask your health care provider about a prescription for nicotine chewing  gum. It can ease your withdrawal from nicotine.  Reward yourself. Set aside the cigarette money you save and buy yourself something nice.  Look for support from others. Join a support group or smoking cessation program. Ask someone at home or at work to help you with your plan to quit smoking.  Always ask yourself, "Do I need this cigarette or is this just a reflex?" Tell yourself, "Today, I choose not to smoke," or "I do not want to smoke." You are reminding yourself of your decision to quit.  Do not replace cigarette smoking with electronic cigarettes (commonly called e-cigarettes). The safety of e-cigarettes is unknown, and some may contain harmful chemicals.  If you relapse, do not give up! Plan ahead and think about what you will do the next time you get the urge to smoke. HOW WILL I FEEL WHEN I QUIT SMOKING? You may have symptoms of withdrawal because your body is used to nicotine (the addictive substance in cigarettes). You may crave cigarettes, be irritable, feel very hungry, cough often, get headaches, or have difficulty concentrating. The withdrawal symptoms are only temporary. They are strongest when you first quit but will go away within 10-14 days. When withdrawal symptoms occur, stay in control. Think about your reasons for quitting. Remind yourself that these are signs that your body is healing and getting used to being without cigarettes. Remember that withdrawal symptoms are easier to treat than the major diseases that smoking can cause.  Even after the withdrawal is over, expect periodic urges to smoke. However, these cravings are generally short lived and will go away whether you smoke or not. Do not smoke! WHAT RESOURCES ARE AVAILABLE TO HELP ME QUIT SMOKING? Your health care provider can direct you to community resources or hospitals for support, which may include:  Group support.  Education.  Hypnosis.  Therapy.   This information is not intended to replace advice given  to you by your health care provider. Make sure you discuss any questions you have with your health care provider.   Document Released: 04/21/2004 Document Revised: 08/14/2014 Document Reviewed: 01/09/2013 Elsevier Interactive Patient Education 2016 [employer].     Steps to Quit Smoking  Smoking tobacco can be harmful to your health and can affect almost every organ in your body. Smoking puts you, and those around you, at risk for developing many serious chronic diseases. Quitting smoking is difficult, but it is one of the best things that you can do for your  health. It is never too late to quit. WHAT ARE THE BENEFITS OF QUITTING SMOKING? When you quit smoking, you lower your risk of developing serious diseases and conditions, such as:  Lung cancer or lung disease, such as COPD.  Heart disease.  Stroke.  Heart attack.  Infertility.  Osteoporosis and bone fractures. Additionally, symptoms such as coughing, wheezing, and shortness of breath may get better when you quit. You may also find that you get sick less often because your body is stronger at fighting off colds and infections. If you are pregnant, quitting smoking can help to reduce your chances of having a baby of low birth weight. HOW DO I GET READY TO QUIT? When you decide to quit smoking, create a plan to make sure that you are successful. Before you quit:  Pick a date to quit. Set a date within the next two weeks to give you time to prepare.  Write down the reasons why you are quitting. Keep this list in places where you will see it often, such as on your bathroom mirror or in your car or wallet.  Identify the people, places, things, and activities that make you want to smoke (triggers) and avoid them. Make sure to take these actions:  Throw away all cigarettes at home, at work, and in your car.  Throw away smoking accessories, such as Scientist, research (medical).  Clean your car and make sure to empty the ashtray.  Clean  your home, including curtains and carpets.  Tell your family, friends, and coworkers that you are quitting. Support from your loved ones can make quitting easier.  Talk with your health care provider about your options for quitting smoking.  Find out what treatment options are covered by your health insurance. WHAT STRATEGIES CAN I USE TO QUIT SMOKING?  Talk with your healthcare provider about different strategies to quit smoking. Some strategies include:  Quitting smoking altogether instead of gradually lessening how much you smoke over a period of time. Research shows that quitting "cold [place]" is more successful than gradually quitting.  Attending in-person counseling to help you build problem-solving skills. You are more likely to have success in quitting if you attend several counseling sessions. Even short sessions of 10 minutes can be effective.  Finding resources and support systems that can help you to quit smoking and remain smoke-free after you quit. These resources are most helpful when you use them often. They can include:  Online chats with a Social worker.  Telephone quitlines.  Printed Furniture conservator/restorer.  Support groups or group counseling.  Text messaging programs.  Mobile phone applications.  Taking medicines to help you quit smoking. (If you are pregnant or breastfeeding, talk with your health care provider first.) Some medicines contain nicotine and some do not. Both types of medicines help with cravings, but the medicines that include nicotine help to relieve withdrawal symptoms. Your health care provider may recommend:  Nicotine patches, gum, or lozenges.  Nicotine inhalers or sprays.  Non-nicotine medicine that is taken by mouth. Talk with your health care provider about combining strategies, such as taking medicines while you are also receiving in-person counseling. Using these two strategies together makes you more likely to succeed in quitting than if you  used either strategy on its own. If you are pregnant or breastfeeding, talk with your health care provider about finding counseling or other support strategies to quit smoking. Do not take medicine to help you quit smoking unless told to do so by your  health care provider. WHAT THINGS CAN I DO TO MAKE IT EASIER TO QUIT? Quitting smoking might feel overwhelming at first, but there is a lot that you can do to make it easier. Take these important actions:  Reach out to your family and friends and ask that they support and encourage you during this time. Call telephone quitlines, reach out to support groups, or work with a counselor for support.  Ask people who smoke to avoid smoking around you.  Avoid places that trigger you to smoke, such as bars, parties, or smoke-break areas at work.  Spend time around people who do not smoke.  Lessen stress in your life, because stress can be a smoking trigger for some people. To lessen stress, try:  Exercising regularly.  Deep-breathing exercises.  Yoga.  Meditating.  Performing a body scan. This involves closing your eyes, scanning your body from head to toe, and noticing which parts of your body are particularly tense. Purposefully relax the muscles in those areas.  Download or purchase mobile phone or tablet apps (applications) that can help you stick to your quit plan by providing reminders, tips, and encouragement. There are many free apps, such as QuitGuide from the [employer] manager for Disease Control and Prevention). You can find other support for quitting smoking (smoking cessation) through smokefree.gov and other websites. HOW WILL I FEEL WHEN I QUIT SMOKING? Within the first 24 hours of quitting smoking, you may start to feel some withdrawal symptoms. These symptoms are usually most noticeable 2-3 days after quitting, but they usually do not last beyond 2-3 weeks. Changes or symptoms that you might experience include:  Mood  swings.  Restlessness, anxiety, or irritation.  Difficulty concentrating.  Dizziness.  Strong cravings for sugary foods in addition to nicotine.  Mild weight gain.  Constipation.  Nausea.  Coughing or a sore throat.  Changes in how your medicines work in your body.  A depressed mood.  Difficulty sleeping (insomnia). After the first 2-3 weeks of quitting, you may start to notice more positive results, such as:  Improved sense of smell and taste.  Decreased coughing and sore throat.  Slower heart rate.  Lower blood pressure.  Clearer skin.  The ability to breathe more easily.  Fewer sick days. Quitting smoking is very challenging for most people. Do not get discouraged if you are not successful the first time. Some people need to make many attempts to quit before they achieve long-term success. Do your best to stick to your quit plan, and talk with your health care provider if you have any questions or concerns.   This information is not intended to replace advice given to you by your health care provider. Make sure you discuss any questions you have with your health care provider.   Document Released: 07/18/2001 Document Revised: 12/08/2014 Document Reviewed: 12/08/2014 Elsevier Interactive Patient Education 2016 [place].   Re decreasing gas for the next ultrasound:  The night before the test, at bedtime take 2 Extra Strength Gas-X, and again take 2 more Extra Strength Gas-X two-four hours before the ultrasound.

## 2015-09-17 NOTE — Progress Notes (Signed)
CC: follow up renal artery stent and CIA aneurysm  History of Present Illness  [name] is a 70 y.o. (02/21/1946) female patient of Dr. 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 Pt underwent L RAS 11/28/10. Previous renal duplex completed on 03/31/11 reveals: R RA stenosis: <60% and L RA stenosis: <60%.  The patient's urinary history has remained stable. Additionally, the patient has known R CIA aneurysm. She notes no symptoms from this R CIA aneurysm.  She denies abdominal pain. She has DDD, severe arthritis, stenosis of her spine, and has chronic pain from this; she is under tx by pain management.  She has no new back pain.   Other medical problems include IBS with loose stools. She denies cardiac problems.  Pt denies any hx of stroke or TIA. Pt states her barrier to walking much is numbness in her feet and joint pain in her legs.  Pt's medications include daily ASA, Plavix, beta blocker, statin. She has DM, takes insulin, A1C January 2016 was 6.1, on 08/16/15 was 5.6. Pt is a current smoker: smokes 1/2 ppd; she is working with her PCP on smoking cessation.  Her daughter is very sick, under 89 pounds, is under evaluation for this; pt is very stressed by this; this is her reason for continued smoking.     Past Medical History  Diagnosis Date  . Hypertension   . Hyperlipidemia   . PUD (peptic ulcer disease)   . Charcot-Marie-Tooth disease   . Arthritis   . Neurogenic bladder   . Diabetic peripheral neuropathy (HCC)   . Osteopenia   . Post-menopausal   . Insomnia   . COPD (chronic obstructive pulmonary disease) (HCC)   . DDD (degenerative disc disease)     Lumbar and lumbosacral  . Tobacco dependence   . Thyroid disease     hypo  . Gastric ulcer     w/o hemorrhage  . Diabetes mellitus     type 2  . Depression   . Obesity   . Unspecified hereditary and idiopathic peripheral neuropathy   . Lumbosacral root  lesions, not elsewhere classified   . Other symptoms referable to back   . Thoracic spondylosis without myelopathy   . Facet syndrome, lumbar   . Spinal stenosis, lumbar region, without neurogenic claudication   . 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 Smoking status: Current Every Day Smoker -- 1.00 packs/day for 49 years    Types: Cigarettes  . Smokeless tobacco: Never Used     Comment: pt states she is going to try the E-cig  . Alcohol Use: No    Family History Family History  Problem Relation Age of Onset  . Stroke Mother   . Heart disease Father 62    heart attack  . Hypertension Father   . Cancer Father     lung  . Diabetes Paternal Aunt     insulin dependent    Surgical History Past Surgical History  Procedure Laterality Date  . Cholecystectomy    . Replacement total knee    . Fluoroscopic l5 dorsal medial branch bloc    . Bilateral l3 medial branch block    . Fibroid tumor removal    . Renal artery stent  11/28/2010    left  . Ankle reconstruction      left  ankle, plate and pins     Allergies  Allergen Reactions  . Varenicline Nausea And Vomiting    Current Outpatient Prescriptions  Medication Sig Dispense Refill  . alendronate (FOSAMAX) 70 MG tablet TAKE 1 TAB EVERY 7 DAYS. TAKE IN THE MORNING WITH A FULL GLASS OF WATER ON AN EMPTY STOMACH.  DO NOT INGEST ANY OTHER FOOD OR DRINK OR LIE D 4 tablet 6  . aspirin EC 81 MG tablet Take 81 mg by mouth daily.    . clopidogrel (PLAVIX) 75 MG tablet TAKE 1 TABLET DAILY 30 tablet 6  . dicyclomine (BENTYL) 20 MG tablet Take 1 tablet (20 mg total) by mouth 3 (three) times daily as needed for spasms. 90 tablet 1  . FLUoxetine (PROZAC) 40 MG capsule Take 2 capsules (80 mg total) by mouth daily. Must make follow up appointment. 60 capsule 5  . furosemide (LASIX) 40 MG tablet TAKE 1 OR 2 TABLETS EVERY MORNING. 90 tablet 1  . gabapentin  (NEURONTIN) 400 MG capsule TAKE ONE CAPSULE THREE TIMES DAILY AS NEEDED (Patient taking differently: 4 (four) times daily as needed. TAKE ONE CAPSULE THREE TIMES DAILY AS NEEDED) 270 capsule 1  . glipiZIDE (GLUCOTROL) 5 MG tablet Take 1 tablet (5 mg total) by mouth daily before breakfast. 90 tablet 3  . glucose blood test strip 1 each as needed. Use as instructed     . HYDROcodone-acetaminophen (NORCO) 10-325 MG per tablet Take 1 tablet by mouth every 6 (six) hours as needed. Do not take more that 5 tabs a day    . Lancets MISC by Does not apply route.      Marland Kitchen LANTUS SOLOSTAR 100 UNIT/ML Solostar Pen inject 48 UNITS daily (Patient taking differently: inject 48 UNITS daily. pt reports she has been injecting 15 units) 15 mL 6  . levothyroxine (SYNTHROID, LEVOTHROID) 100 MCG tablet Take 1 tablet (100 mcg total) by mouth daily. PATIENT NEEDS LABS FOR FURTHER REFILLS 60 tablet 1  . lidocaine (XYLOCAINE) 5 % ointment Apply 1 application topically 3 (three) times daily as needed. 35 g 0  . metFORMIN (GLUCOPHAGE) 500 MG tablet Take 1 tablet (500 mg total) by mouth 3 (three) times daily. 270 tablet 1  . metoprolol (LOPRESSOR) 100 MG tablet Take 100 mg by mouth 2 (two) times daily.  1  . nitroGLYCERIN (NITROSTAT) 0.4 MG SL tablet Place 1 tablet (0.4 mg total) under the tongue every 5 (five) minutes as needed. 10 tablet 1  . pantoprazole (PROTONIX) 40 MG tablet Take 1 tablet (40 mg total) by mouth daily. 30 tablet 3  . promethazine (PHENERGAN) 25 MG tablet Take 1 tablet (25 mg total) by mouth every 8 (eight) hours as needed for nausea or vomiting. 30 tablet 1  . simvastatin (ZOCOR) 40 MG tablet Take 1 tablet (40 mg total) by mouth at bedtime. APPOINTMENT NEEDED FOR FURTHER REFILLS 90 tablet 0  . Suvorexant (BELSOMRA) 10 MG TABS Take 1 tablet by mouth at bedtime. 7 tablet 0  . tiotropium (SPIRIVA) 18 MCG inhalation capsule Place 1 capsule (18 mcg total) into inhaler and inhale daily. 90 capsule 3  . valACYclovir  (VALTREX) 1000 MG tablet Take 1 tablet (1,000 mg total) by mouth daily. 5 tablet 0  . VENTOLIN HFA 108 (90 BASE) MCG/ACT inhaler Inhale 2 puffs into the lungs every 6 (six) hours as needed for wheezi ng. 18 each 3  . zolpidem (AMBIEN) 5 MG tablet Take 1 tablet (5 mg total) by mouth at bedtime  as needed for sleep. APPOINTMENT NEEDED FOR FURTHER REFILLS 90 tablet 0   No current facility-administered medications for this visit.    REVIEW OF SYSTEMS: see HPI for pertinent positives and negatives    Physical Examination  Filed Vitals:   09/17/15 0946  BP: 108/52  Pulse: 42  Temp: 98.1 F (36.7 C)  TempSrc: Oral  Resp: 12  Height: 5\' 7"  (1.702 m)  Weight: 186 lb (84.369 kg)  SpO2: 95%   Body mass index is 29.12 kg/(m^2).   General: The patient appears their stated age HEENT: No gross abnormalities Pulmonary: Respirations are non-labored Abdomen: Soft and non-tender. Musculoskeletal: There are no major deformities.  Neurologic: No focal weakness or paresthesias are detected Skin: There are no ulcer or rashes. Psychiatric: The patient has normal affect. Cardiovascular: There is a regular rate and rhythm without significant murmur appreciated.   Vascular: Vessel Right Left  Radial 2+Palpable 2+Palpable  Carotid Audible without bruit audible without bruit  Aorta Not palpable N/A  Femoral 2+Palpable 3+Palpable  Popliteal Not palpable Not palpable  PT notPalpable notPalpable  DP 2+Palpable 2+Palpable             Non-Invasive Vascular Imaging: (09/17/2015)  ABDOMINAL AORTA DUPLEX EVALUATION    INDICATION: Re-evaluation of known right common iliac artery aneurysm    PREVIOUS INTERVENTION(S): None    DUPLEX EXAM: Adequate quality exam.    LOCATION DIAMETER AP (cm) DIAMETER TRANSVERSE (cm) VELOCITIES (cm/sec)  Aorta Proximal 3.2 3.0 52.2  Aorta Mid 3.19 3.0 51.0  Aorta Distal 2.6 2.4 61.7  Right Common Iliac Artery 2.1 2.0 127  Left  Common Iliac Artery 1.5 1.4 105    Previous max aortic diameter:  2.2 Date: 09/11/2014     ADDITIONAL FINDINGS:     IMPRESSION: Diffuse ectasia of the abdominal aortic which is tortuous. Stable aneurysm of the right common iliac artery measuring 2.1 x 2.1 cm.    Compared to the previous exam:  No significant change compared to previous exam of 09/11/2014.     RENAL ARTERY DUPLEX EVALUATION    INDICATION: Follow up left renal artery stent    PREVIOUS INTERVENTION(S): Left renal artery stent placed 11/28/2010    DUPLEX EXAM: Good quality    AORTA Peak Systolic Velocity (cm/s): 54    RIGHT  LEFT   Peak Systolic Velocities (cm/s) Comments  Peak Systolic Velocities (cm/s) Comments  136  Renal Artery Origin/Proximal 136   66  Renal Artery Mid 101   61  Renal Artery Distal 66   NA  Accessory Renal Artery (when present) NA   2.5  Renal / Aortic Ratio (RAR) 2.5  9.3 Kidney Size (cm) 8.8  Patent  Renal Vein patent    Peak Systolic Velocities (cm/s) Comments  Renal Artery Stent (  ) Peak Systolic Velocities (cm/s) Comments    Proximal 79     Mid 119     Distal 103     ADDITIONAL FINDINGS:     IMPRESSION: 1. Technically difficult exam due to bowel gas and body habitus. 2. The left stent velocity appears within normal range. 3. Less than 60% stenosis of the right renal artery    Compared to the previous exam:  No significant change compared to previous exam of 09/11/2014.     Medical Decision Making  Careena Alvidrez is a 70 y.o. 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 Pt underwent L RAS 11/28/10.  Her blood pressure remains in  good control, she has no abdominal pain.  Renal artery duplex today was a technically difficult exam due to bowel gas and body habitus. The left stent velocity appears within normal range. Less than 60% stenosis of the right renal artery. No significant change compared to previous exam of 09/11/2014.  Aortoiliac  duplex today reveals diffuse ectasia of the abdominal aortic which is tortuous. Stable aneurysm of the right common iliac artery measuring 2.1 x 2.1 cm No significant change compared to previous exam of 09/11/2014.   Her atherosclerotic risk factors include active smoking and controlled DM. The patient was counseled re smoking cessation and given several free resources re smoking cessation. She takes a daily ASA, Plavix, and a statin.   Based on the patient's vascular studies and examination, I have offered the patient: bilateral renal artery duplex, and Aortoiliac duplex in 12 months.  I discussed in depth with the patient the nature of atherosclerosis, and emphasized the importance of maximal medical management including strict control of blood pressure, blood glucose, and lipid levels, antiplatelet agents, obtaining regular exercise, and cessation of smoking.  The patient is aware that without maximal medical management the underlying atherosclerotic disease process will progress, limiting the benefit of any interventions.  Thank you for allowing [place] to participate in this patient's care.  Jeffre Enriques, Carma Lair, RN, MSN, FNP-C [facility] Office: [phone]  09/17/2015, 9:42 AM  Clinic MD:  Imogene Burn

## 2015-09-20 NOTE — Addendum Note (Signed)
Addended by: Dorthula Rue L on: 09/20/2015 11:33 AM   Modules accepted: Orders

## 2015-09-27 ENCOUNTER — Other Ambulatory Visit: Payer: Self-pay | Admitting: Family Medicine

## 2015-10-15 ENCOUNTER — Encounter: Payer: Self-pay | Admitting: *Deleted

## 2015-10-20 ENCOUNTER — Other Ambulatory Visit: Payer: Self-pay | Admitting: Family Medicine

## 2015-10-20 ENCOUNTER — Other Ambulatory Visit: Payer: Self-pay | Admitting: *Deleted

## 2015-10-20 MED ORDER — FUROSEMIDE 40 MG PO TABS: ORAL_TABLET | ORAL | Status: DC

## 2015-10-25 DIAGNOSIS — Z79891 Long term (current) use of opiate analgesic: Secondary | ICD-10-CM | POA: Diagnosis not present

## 2015-10-25 DIAGNOSIS — G894 Chronic pain syndrome: Secondary | ICD-10-CM | POA: Diagnosis not present

## 2015-10-25 DIAGNOSIS — M5136 Other intervertebral disc degeneration, lumbar region: Secondary | ICD-10-CM | POA: Diagnosis not present

## 2015-10-25 DIAGNOSIS — M47816 Spondylosis without myelopathy or radiculopathy, lumbar region: Secondary | ICD-10-CM | POA: Diagnosis not present

## 2015-10-25 DIAGNOSIS — M62830 Muscle spasm of back: Secondary | ICD-10-CM | POA: Diagnosis not present

## 2015-11-02 ENCOUNTER — Other Ambulatory Visit: Payer: Self-pay | Admitting: Family Medicine

## 2015-11-27 ENCOUNTER — Other Ambulatory Visit: Payer: Self-pay | Admitting: Family Medicine

## 2015-12-06 ENCOUNTER — Other Ambulatory Visit: Payer: Self-pay | Admitting: Family Medicine

## 2015-12-08 LAB — HM DIABETES EYE EXAM

## 2015-12-20 ENCOUNTER — Encounter: Payer: Self-pay | Admitting: Family Medicine

## 2015-12-20 ENCOUNTER — Ambulatory Visit: Payer: Medicare Other | Admitting: Family Medicine

## 2015-12-27 ENCOUNTER — Other Ambulatory Visit: Payer: Self-pay | Admitting: Family Medicine

## 2015-12-27 DIAGNOSIS — G894 Chronic pain syndrome: Secondary | ICD-10-CM | POA: Diagnosis not present

## 2015-12-27 DIAGNOSIS — M62830 Muscle spasm of back: Secondary | ICD-10-CM | POA: Diagnosis not present

## 2015-12-27 DIAGNOSIS — Z79891 Long term (current) use of opiate analgesic: Secondary | ICD-10-CM | POA: Diagnosis not present

## 2015-12-27 DIAGNOSIS — M47816 Spondylosis without myelopathy or radiculopathy, lumbar region: Secondary | ICD-10-CM | POA: Diagnosis not present

## 2015-12-27 DIAGNOSIS — M5136 Other intervertebral disc degeneration, lumbar region: Secondary | ICD-10-CM | POA: Diagnosis not present

## 2016-01-07 ENCOUNTER — Ambulatory Visit: Payer: Medicare Other | Admitting: Family Medicine

## 2016-01-26 ENCOUNTER — Other Ambulatory Visit: Payer: Self-pay | Admitting: Family Medicine

## 2016-01-27 ENCOUNTER — Other Ambulatory Visit: Payer: Self-pay | Admitting: Family Medicine

## 2016-01-31 ENCOUNTER — Other Ambulatory Visit: Payer: Self-pay | Admitting: Family Medicine

## 2016-02-10 ENCOUNTER — Ambulatory Visit: Payer: Medicare Other | Admitting: Family Medicine

## 2016-02-11 ENCOUNTER — Other Ambulatory Visit: Payer: Self-pay | Admitting: Family Medicine

## 2016-02-15 DIAGNOSIS — M5136 Other intervertebral disc degeneration, lumbar region: Secondary | ICD-10-CM | POA: Diagnosis not present

## 2016-02-15 DIAGNOSIS — M7551 Bursitis of right shoulder: Secondary | ICD-10-CM | POA: Diagnosis not present

## 2016-02-15 DIAGNOSIS — M47816 Spondylosis without myelopathy or radiculopathy, lumbar region: Secondary | ICD-10-CM | POA: Diagnosis not present

## 2016-02-25 ENCOUNTER — Other Ambulatory Visit: Payer: Self-pay | Admitting: Family Medicine

## 2016-02-28 ENCOUNTER — Other Ambulatory Visit: Payer: Self-pay | Admitting: Family Medicine

## 2016-02-29 ENCOUNTER — Ambulatory Visit: Payer: Medicare Other | Admitting: Family Medicine

## 2016-02-29 ENCOUNTER — Telehealth: Payer: Self-pay

## 2016-02-29 MED ORDER — LEVOTHYROXINE SODIUM 100 MCG PO TABS: 100.0000 ug | ORAL_TABLET | Freq: Every day | ORAL | 0 refills | Status: DC

## 2016-02-29 NOTE — Telephone Encounter (Signed)
Medications have been sent.[name]  

## 2016-02-29 NOTE — Telephone Encounter (Signed)
[name] [phone] [name])  [name] came in 20 minutes late for her appointment so we rescheduled for 03/15/16, in the mean time she is out of 3 of her medications  alendronate (FOSAMAX) 70 MG tablet / levothyroxine (SYNTHROID, LEVOTHROID) 100 MCG tablet / simvastatin (ZOCOR) 40 MG tablet      She would like to get refills on these.

## 2016-03-15 ENCOUNTER — Encounter: Payer: Self-pay | Admitting: Family Medicine

## 2016-03-15 ENCOUNTER — Ambulatory Visit (INDEPENDENT_AMBULATORY_CARE_PROVIDER_SITE_OTHER): Payer: Medicare Other | Admitting: Family Medicine

## 2016-03-15 VITALS — BP 117/64 | HR 50 | Resp 16 | Wt 179.0 lb

## 2016-03-15 DIAGNOSIS — G4700 Insomnia, unspecified: Secondary | ICD-10-CM | POA: Diagnosis not present

## 2016-03-15 DIAGNOSIS — E1140 Type 2 diabetes mellitus with diabetic neuropathy, unspecified: Secondary | ICD-10-CM | POA: Diagnosis not present

## 2016-03-15 DIAGNOSIS — I701 Atherosclerosis of renal artery: Secondary | ICD-10-CM

## 2016-03-15 DIAGNOSIS — I10 Essential (primary) hypertension: Secondary | ICD-10-CM

## 2016-03-15 DIAGNOSIS — N183 Chronic kidney disease, stage 3 (moderate): Secondary | ICD-10-CM | POA: Diagnosis not present

## 2016-03-15 DIAGNOSIS — E039 Hypothyroidism, unspecified: Secondary | ICD-10-CM | POA: Diagnosis not present

## 2016-03-15 DIAGNOSIS — N1832 Chronic kidney disease, stage 3b: Secondary | ICD-10-CM

## 2016-03-15 DIAGNOSIS — J441 Chronic obstructive pulmonary disease with (acute) exacerbation: Secondary | ICD-10-CM

## 2016-03-15 DIAGNOSIS — F17200 Nicotine dependence, unspecified, uncomplicated: Secondary | ICD-10-CM

## 2016-03-15 LAB — POCT GLYCOSYLATED HEMOGLOBIN (HGB A1C): Hemoglobin A1C: 5.7

## 2016-03-15 MED ORDER — AZELASTINE HCL 0.15 % NA SOLN: 2.0000 | Freq: Two times a day (BID) | NASAL | 4 refills | Status: DC

## 2016-03-15 MED ORDER — ZOLPIDEM TARTRATE 5 MG PO TABS: 5.0000 mg | ORAL_TABLET | Freq: Every day | ORAL | 1 refills | Status: DC

## 2016-03-15 MED ORDER — DOXYCYCLINE HYCLATE 100 MG PO TABS: 100.0000 mg | ORAL_TABLET | Freq: Two times a day (BID) | ORAL | 0 refills | Status: DC

## 2016-03-15 MED ORDER — FLUOXETINE HCL 40 MG PO CAPS
ORAL_CAPSULE | ORAL | 3 refills | Status: DC
Start: 2016-03-15 — End: 2016-08-14

## 2016-03-15 MED ORDER — NITROGLYCERIN 0.4 MG SL SUBL: 0.4000 mg | SUBLINGUAL_TABLET | SUBLINGUAL | 1 refills | Status: DC | PRN

## 2016-03-15 MED ORDER — METFORMIN HCL 500 MG PO TABS: 500.0000 mg | ORAL_TABLET | Freq: Three times a day (TID) | ORAL | 3 refills | Status: DC

## 2016-03-15 MED ORDER — PREDNISONE 20 MG PO TABS: 40.0000 mg | ORAL_TABLET | Freq: Every day | ORAL | 0 refills | Status: DC

## 2016-03-15 NOTE — Patient Instructions (Signed)
Ok to try Mucinex.

## 2016-03-15 NOTE — Progress Notes (Signed)
Subjective:    CC: DM  HPI: Diabetes - no hypoglycemic events. No wounds or sores that are not healing well. No increased thirst or urination. Checking glucose at home. Taking medications as prescribed without any side effects.  Insomnia - tried melatonin with Ambien and Felt like it was working well initially but over the last month or so feel like she is not sleeping well again. She said on average she is getting maybe 4 hours. She says she doesn't feel rested and less she gets at least 6 hours.   AR- she would like for me to take over her Astelin Rx.   He also complains of a cough that she's had for the last month. She's had some nasal congestion and postnasal drip. Some occasional maxillary sinus pressure. No fevers or shortness of breath. The sputum has been productive with a yellow color. She has felt more fatigued with it. He is also noticed that her blood sugars have been up a little bit more recently.  BP 117/64 (BP Location: Left Arm, Patient Position: Sitting, Cuff Size: Normal)   Pulse (!) 50   Resp 16   Wt 179 lb (81.2 kg)   SpO2 97%   BMI 28.04 kg/m     Allergies  Allergen Reactions  . Varenicline Nausea And Vomiting    Past Medical History:  Diagnosis Date  . Arthritis   . Charcot-Marie-Tooth disease   . COPD (chronic obstructive pulmonary disease) ([place])   . DDD (degenerative disc disease)    Lumbar and lumbosacral  . Depression   . Diabetes mellitus    type 2  . Diabetic peripheral neuropathy ([place])   . Facet syndrome, lumbar   . Gastric ulcer    w/o hemorrhage  . Hyperlipidemia   . Hypertension   . Insomnia   . Lumbosacral root lesions, not elsewhere classified   . Neurogenic bladder   . Obesity   . Osteopenia   . Other symptoms referable to back   . Post-menopausal   . PUD (peptic ulcer disease)   . 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 Spinal stenosis, lumbar region, without neurogenic claudication   . Thoracic spondylosis  without myelopathy   . Thyroid disease    hypo  . Tobacco dependence   . Unspecified hereditary and idiopathic peripheral neuropathy     Past Surgical History:  Procedure Laterality Date  . ANKLE RECONSTRUCTION     left ankle, plate and pins   . bilateral L3 medial branch block    . CHOLECYSTECTOMY    . fibroid tumor removal    . fluoroscopic L5 dorsal medial branch bloc    . RENAL ARTERY STENT  11/28/2010   left  . REPLACEMENT TOTAL KNEE      Social History   Social History  . Marital status: Widowed    Spouse name: N/A  . Number of children: N/A  . Years of education: N/A   Occupational History  . Not on file.   Social History Main Topics  . Smoking status: Current Every Day Smoker    Packs/day: 0.75    Years: 49.00    Types: Cigarettes  . Smokeless tobacco: Never Used     Comment: pt states she is going to try the E-cig  . Alcohol use No  . Drug use: No  . Sexual activity: Not on file   Other Topics Concern  . Not on file   Social History Narrative  .  No narrative on file    Family History  Problem Relation Age of Onset  . Stroke Mother   . Heart disease Father 60    heart attack  . Hypertension Father   . Cancer Father     lung  . Diabetes Paternal Aunt     insulin dependent    Outpatient Encounter Prescriptions as of 03/15/2016  Medication Sig  . GAMUNEX-C 20 GM/200ML SOLN   . GAMUNEX-C 5 GM/50ML SOLN   . alendronate (FOSAMAX) 70 MG tablet TAKE 1 TAB EVERY 7 DAYS. TAKE IN THE MORNING WITH A FULL GLASS OF WATER ON AN EMPTY STOMACH. DO NOT INGEST ANY OTHER FOOD OR DRINK OR LIE D  . aspirin EC 81 MG tablet Take 81 mg by mouth daily.  . Azelastine HCl 0.15 % SOLN Place 2 sprays into the nose 2 (two) times daily.  . clopidogrel (PLAVIX) 75 MG tablet TAKE 1 TABLET BY MOUTH EVERY DAY  . dicyclomine (BENTYL) 20 MG tablet Take 1 tablet (20 mg total) by mouth 3 (three) times daily as needed for spasms.  Marland Kitchen doxycycline (VIBRA-TABS) 100 MG tablet Take 1  tablet (100 mg total) by mouth 2 (two) times daily.  Marland Kitchen FLUoxetine (PROZAC) 40 MG capsule Two capsules by mouth daily  . furosemide (LASIX) 40 MG tablet TAKE 1 OR 2 TABLETS EVERY MORNING.  Marland Kitchen gabapentin (NEURONTIN) 400 MG capsule TAKE ONE CAPSULE THREE TIMES DAILY AS NEEDED (Patient taking differently: 4 (four) times daily as needed. TAKE ONE CAPSULE THREE TIMES DAILY AS NEEDED)  . glipiZIDE (GLUCOTROL) 5 MG tablet Take 1 tablet (5 mg total) by mouth daily before breakfast.  . glucose blood test strip 1 each as needed. Use as instructed   . HYDROcodone-acetaminophen (NORCO) 10-325 MG per tablet Take 1 tablet by mouth every 6 (six) hours as needed. Do not take more that 5 tabs a day  . Lancets MISC by Does not apply route.    Marland Kitchen levothyroxine (SYNTHROID, LEVOTHROID) 100 MCG tablet Take 1 tablet (100 mcg total) by mouth daily before breakfast.  . lidocaine (XYLOCAINE) 5 % ointment Apply 1 application topically 3 (three) times daily as needed.  . metFORMIN (GLUCOPHAGE) 500 MG tablet Take 1 tablet (500 mg total) by mouth 3 (three) times daily.  . metoprolol (LOPRESSOR) 100 MG tablet TAKE ONE TABLET TWICE DAILY  . nitroGLYCERIN (NITROSTAT) 0.4 MG SL tablet Place 1 tablet (0.4 mg total) under the tongue every 5 (five) minutes as needed.  . predniSONE (DELTASONE) 20 MG tablet Take 2 tablets (40 mg total) by mouth daily with breakfast.  . simvastatin (ZOCOR) 40 MG tablet Take 1 tablet (40 mg total) by mouth daily at 6 PM.  . tiotropium (SPIRIVA) 18 MCG inhalation capsule Place 1 capsule (18 mcg total) into inhaler and inhale daily.  . valACYclovir (VALTREX) 1000 MG tablet Take 1 tablet (1,000 mg total) by mouth daily.  . VENTOLIN HFA 108 (90 BASE) MCG/ACT inhaler Inhale 2 puffs into the lungs every 6 (six) hours as needed for wheezi ng.  . zolpidem (AMBIEN) 5 MG tablet Take 1 tablet (5 mg total) by mouth at bedtime.  . [DISCONTINUED] FLUoxetine (PROZAC) 40 MG capsule *NO ADDITIONAL REFILLS. PLEASE CALL  OFFICE TO SCHEDULE AN APPOINTMENT*  . [DISCONTINUED] LANTUS SOLOSTAR 100 UNIT/ML Solostar Pen inject 48 UNITS daily (Patient taking differently: inject 48 UNITS daily. pt reports she has been injecting 10 units)  . [DISCONTINUED] metFORMIN (GLUCOPHAGE) 500 MG tablet TAKE 1 TABLET THREE TIMES DAILY  . [  DISCONTINUED] nitroGLYCERIN (NITROSTAT) 0.4 MG SL tablet Place 1 tablet (0.4 mg total) under the tongue every 5 (five) minutes as needed.  . [DISCONTINUED] pantoprazole (PROTONIX) 40 MG tablet Take 1 tablet (40 mg total) by mouth daily.  . [DISCONTINUED] promethazine (PHENERGAN) 25 MG tablet Take 1 tablet (25 mg total) by mouth every 8 (eight) hours as needed for nausea or vomiting.  . [DISCONTINUED] Suvorexant (BELSOMRA) 10 MG TABS Take 1 tablet by mouth at bedtime. (Patient not taking: Reported on 09/17/2015)  . [DISCONTINUED] zolpidem (AMBIEN) 5 MG tablet Take 1 tablet (5 mg total) by mouth at bedtime. APPOINTMENT NEEDED FOR FURTHER REFILLS   No facility-administered encounter medications on file as of 03/15/2016.         Review of Systems: No fevers, chills, night sweats, weight loss, chest pain, or shortness of breath.   Objective:    General: Well Developed, well nourished, and in no acute distress.  Neuro: Alert and oriented x3, extra-ocular muscles intact, sensation grossly intact.  HEENT: Normocephalic, atraumatic  Skin: Warm and dry, no rashes. Cardiac: Regular rate and rhythm, no murmurs rubs or gallops, no lower extremity edema.  Respiratory: Clear to auscultation bilaterally. Not using accessory muscles, speaking in full sentences.   Impression and Recommendations:   DM- Well-controlled. Continue current regimen. Follow-up in 3-4 months. Lab Results  Component Value Date   HGBA1C 5.7 03/15/2016   AR- Will send a new prescription for Astelin to help with postnasal drip.  Insomnia - for now we'll continue with current regimen. Discussed getting her cough better and also  checking her for hypoxemia overnight. If her cough improves and her chest is negative but we'll just see if she feels like her sleep is improving over the next couple of weeks. We can also consider a trial of doxepin in the future if she continues to have problems.  COPD exacerbation-we'll treat with doxycycline and an short course of prednisone.  Tobacco abuse-she is excited because she has cut her smoking down by about 40%. Encouraged her to just continue to work at a think this is absolutely fantastic and congratulated her today.

## 2016-03-16 LAB — BASIC METABOLIC PANEL
BUN: 21 mg/dL (ref 7–25)
CALCIUM: 9.3 mg/dL (ref 8.6–10.4)
CHLORIDE: 95 mmol/L — AB (ref 98–110)
CO2: 28 mmol/L (ref 20–31)
CREATININE: 1.29 mg/dL — AB (ref 0.60–0.93)
Glucose, Bld: 75 mg/dL (ref 65–99)
Potassium: 4.1 mmol/L (ref 3.5–5.3)
Sodium: 137 mmol/L (ref 135–146)

## 2016-03-16 LAB — TSH: TSH: 44.95 mIU/L — ABNORMAL HIGH

## 2016-03-16 NOTE — Addendum Note (Signed)
Addended by: Teddy Spike on: 03/16/2016 08:14 AM   Modules accepted: Orders

## 2016-03-24 ENCOUNTER — Telehealth: Payer: Self-pay | Admitting: Family Medicine

## 2016-03-24 MED ORDER — NYSTATIN 100000 UNIT/ML MT SUSP: OROMUCOSAL | 0 refills | Status: DC

## 2016-03-24 NOTE — Telephone Encounter (Signed)
Pt called clinic today stating she still has a "terrible cough" and she has had a "splitting headache for the past 5 days." Pt reports she has completed her Prednisone Rx but is still taking the antibiotic. Pt also reports she has thrush in her mouth. Request an Rx for this. Will route.

## 2016-03-24 NOTE — Telephone Encounter (Signed)
The cough may be exacerbated by the thrush is she feels like she now has thrush. Okay to send over nystatin swish and swallow 5 mLs 3 times a day for 7 days.

## 2016-03-24 NOTE — Telephone Encounter (Signed)
Rx sent Pt advised 

## 2016-04-03 ENCOUNTER — Encounter: Payer: Self-pay | Admitting: Family Medicine

## 2016-04-03 ENCOUNTER — Ambulatory Visit (INDEPENDENT_AMBULATORY_CARE_PROVIDER_SITE_OTHER): Payer: Medicare Other | Admitting: Family Medicine

## 2016-04-03 VITALS — BP 140/53 | HR 55 | Wt 178.0 lb

## 2016-04-03 DIAGNOSIS — I10 Essential (primary) hypertension: Secondary | ICD-10-CM | POA: Diagnosis not present

## 2016-04-03 DIAGNOSIS — I701 Atherosclerosis of renal artery: Secondary | ICD-10-CM | POA: Diagnosis not present

## 2016-04-03 DIAGNOSIS — K529 Noninfective gastroenteritis and colitis, unspecified: Secondary | ICD-10-CM

## 2016-04-03 DIAGNOSIS — R0982 Postnasal drip: Secondary | ICD-10-CM

## 2016-04-03 DIAGNOSIS — Z23 Encounter for immunization: Secondary | ICD-10-CM

## 2016-04-03 DIAGNOSIS — G4700 Insomnia, unspecified: Secondary | ICD-10-CM | POA: Diagnosis not present

## 2016-04-03 MED ORDER — DIPHENOXYLATE-ATROPINE 2.5-0.025 MG PO TABS: 1.0000 | ORAL_TABLET | Freq: Two times a day (BID) | ORAL | 0 refills | Status: DC | PRN

## 2016-04-03 MED ORDER — VALACYCLOVIR HCL 1 G PO TABS: 1000.0000 mg | ORAL_TABLET | Freq: Every day | ORAL | 0 refills | Status: DC

## 2016-04-03 NOTE — Progress Notes (Signed)
Subjective:    CC:   HPI:  Insomnia - F/U for insomnia. We decided to work on getting her acute illness and cough better an see if sleep improved. She still not doing well. Still complains of cough, persistent postnasal drip. To the point where her throat hurts and she's having rib pain. Now she is also having persistent diarrhea. She really did not feel better on the doxycycline and prednisone. Next  She now has a new breakout over her right low back going on her right buttock cheek. Previously when she gets a rash and breaks out is usually on the left side. She wants to know if we can refill the valacyclovir with some refills.  Past medical history, Surgical history, Family history not pertinant except as noted below, Social history, Allergies, and medications have been entered into the medical record, reviewed, and corrections made.   Review of Systems: No fevers, chills, night sweats, weight loss, chest pain, or shortness of breath.   Objective:    General: Well Developed, well nourished, and in no acute distress.  Neuro: Alert and oriented x3, extra-ocular muscles intact, sensation grossly intact.  HEENT: Normocephalic, atraumatic  Skin: Warm and dry, dry erythematous excoriated approx 0.5- scattered lesion in dermatome pattern on the left buttock check.    Cardiac: Regular rate and rhythm, no murmurs rubs or gallops, no lower extremity edema.  Respiratory: Clear to auscultation bilaterally. Not using accessory muscles, speaking in full sentences.  Physical Exam  Skin:         Impression and Recommendations:    Insomnia - Will address at next visit. Still think she'll be a good candidate for doxepin that until we get her cough under control she can have a really hard time getting quality sleep.  HSV - refilled valaciclovir and did put refills on it.  Chronic diarrhea - will get stool culture and C. difficile evaluation. Can try using when necessary Lomotil.  Chronic post  nasal drip with chronic cough - will refer to ENT for further evaluation since she really did not improve with a round of prednisone or antibiotics.

## 2016-04-11 ENCOUNTER — Other Ambulatory Visit: Payer: Self-pay | Admitting: Family Medicine

## 2016-04-11 DIAGNOSIS — K529 Noninfective gastroenteritis and colitis, unspecified: Secondary | ICD-10-CM | POA: Diagnosis not present

## 2016-04-15 LAB — STOOL CULTURE

## 2016-04-17 ENCOUNTER — Other Ambulatory Visit: Payer: Self-pay | Admitting: Family Medicine

## 2016-05-04 ENCOUNTER — Ambulatory Visit (INDEPENDENT_AMBULATORY_CARE_PROVIDER_SITE_OTHER): Payer: Medicare Other | Admitting: Family Medicine

## 2016-05-04 ENCOUNTER — Encounter: Payer: Self-pay | Admitting: Family Medicine

## 2016-05-04 VITALS — BP 150/80 | HR 65 | Wt 185.0 lb

## 2016-05-04 DIAGNOSIS — G4700 Insomnia, unspecified: Secondary | ICD-10-CM

## 2016-05-04 DIAGNOSIS — R05 Cough: Secondary | ICD-10-CM | POA: Diagnosis not present

## 2016-05-04 DIAGNOSIS — K591 Functional diarrhea: Secondary | ICD-10-CM

## 2016-05-04 DIAGNOSIS — I701 Atherosclerosis of renal artery: Secondary | ICD-10-CM

## 2016-05-04 DIAGNOSIS — I10 Essential (primary) hypertension: Secondary | ICD-10-CM | POA: Diagnosis not present

## 2016-05-04 DIAGNOSIS — R053 Chronic cough: Secondary | ICD-10-CM

## 2016-05-04 MED ORDER — FUROSEMIDE 40 MG PO TABS: ORAL_TABLET | ORAL | 1 refills | Status: DC

## 2016-05-04 NOTE — Progress Notes (Signed)
Subjective:    CC: INsomnia  HPI:  Insomnia - She is doing fair with her sleep. Most nights she doesn't sleep well and will sleep for about 4 hours with another nights she does okay. We talked about possibly switching her to doxepin at last office visit. For now she will continue with her Ambien and melatonin.  Diarrhea - she continues to have persistent explosive diarrhea. Says the lomotil does help but has to take 4 a day for it to work. Even having BM at night in her bed and having frequent accident.   Hypertension- Pt denies chest pain, SOB, dizziness, or heart palpitations.  Taking meds as directed w/o problems.  Denies medication side effects.  She has been cutting her BP in half. Taking half a day BID.    She still has persistent chronic cough. We have recommended ENT referral for further evaluation when I saw her last office visit but she says she was never contacted. She did have her home phone disconnected but still has her cell phone active.   Past medical history, Surgical history, Family history not pertinant except as noted below, Social history, Allergies, and medications have been entered into the medical record, reviewed, and corrections made.   Review of Systems: No fevers, chills, night sweats, weight loss, chest pain, or shortness of breath.   Objective:    General: Well Developed, well nourished, and in no acute distress.  Neuro: Alert and oriented x3, extra-ocular muscles intact, sensation grossly intact.  HEENT: Normocephalic, atraumatic  Skin: Warm and dry, no rashes. Cardiac: Regular rate and rhythm, no murmurs rubs or gallops, no lower extremity edema.  Respiratory: Clear to auscultation bilaterally. Not using accessory muscles, speaking in full sentences.   Impression and Recommendations:    Insomnia - continue with ambien and melatonin for now.    Diarrhea - refer to GI.    HTN - Uncontrolled. Restart lasix. She has been out for a week.  Increase to whole  tab metoprolol.    Chronic cough - ENT ref was placed in August. Will need to call and update her phone number.

## 2016-05-08 DIAGNOSIS — G6181 Chronic inflammatory demyelinating polyneuritis: Secondary | ICD-10-CM | POA: Diagnosis not present

## 2016-05-08 DIAGNOSIS — F321 Major depressive disorder, single episode, moderate: Secondary | ICD-10-CM | POA: Diagnosis not present

## 2016-05-08 DIAGNOSIS — M5416 Radiculopathy, lumbar region: Secondary | ICD-10-CM | POA: Diagnosis not present

## 2016-05-09 ENCOUNTER — Telehealth: Payer: Self-pay

## 2016-05-09 DIAGNOSIS — K591 Functional diarrhea: Secondary | ICD-10-CM

## 2016-05-09 NOTE — Telephone Encounter (Signed)
[name] wants a refill on the Lomotil. She is taking 2 daily. She states it doesn't stop the diarrhea but slows it down. She has appointment with [facility] on 05/24/2016 at 1:30. Is it ok to refill?

## 2016-05-09 NOTE — Telephone Encounter (Signed)
Ok to change to BID  

## 2016-05-10 MED ORDER — DIPHENOXYLATE-ATROPINE 2.5-0.025 MG PO TABS: 1.0000 | ORAL_TABLET | Freq: Two times a day (BID) | ORAL | 0 refills | Status: DC | PRN

## 2016-05-10 NOTE — Telephone Encounter (Signed)
Rx sent, left VM for Pt advising.

## 2016-05-24 DIAGNOSIS — R1084 Generalized abdominal pain: Secondary | ICD-10-CM | POA: Diagnosis not present

## 2016-05-24 DIAGNOSIS — R197 Diarrhea, unspecified: Secondary | ICD-10-CM | POA: Diagnosis not present

## 2016-06-01 DIAGNOSIS — M5136 Other intervertebral disc degeneration, lumbar region: Secondary | ICD-10-CM | POA: Diagnosis not present

## 2016-06-01 DIAGNOSIS — M47816 Spondylosis without myelopathy or radiculopathy, lumbar region: Secondary | ICD-10-CM | POA: Diagnosis not present

## 2016-06-01 DIAGNOSIS — Z79891 Long term (current) use of opiate analgesic: Secondary | ICD-10-CM | POA: Diagnosis not present

## 2016-06-01 DIAGNOSIS — G894 Chronic pain syndrome: Secondary | ICD-10-CM | POA: Diagnosis not present

## 2016-06-08 ENCOUNTER — Other Ambulatory Visit: Payer: Self-pay | Admitting: Family Medicine

## 2016-06-12 ENCOUNTER — Other Ambulatory Visit: Payer: Self-pay

## 2016-06-12 MED ORDER — LEVOTHYROXINE SODIUM 100 MCG PO TABS: 100.0000 ug | ORAL_TABLET | Freq: Every day | ORAL | 0 refills | Status: DC

## 2016-07-05 ENCOUNTER — Other Ambulatory Visit: Payer: Self-pay | Admitting: Family Medicine

## 2016-07-06 DIAGNOSIS — M5416 Radiculopathy, lumbar region: Secondary | ICD-10-CM | POA: Diagnosis not present

## 2016-07-06 DIAGNOSIS — F321 Major depressive disorder, single episode, moderate: Secondary | ICD-10-CM | POA: Diagnosis not present

## 2016-07-06 DIAGNOSIS — G6181 Chronic inflammatory demyelinating polyneuritis: Secondary | ICD-10-CM | POA: Diagnosis not present

## 2016-07-06 DIAGNOSIS — G609 Hereditary and idiopathic neuropathy, unspecified: Secondary | ICD-10-CM | POA: Diagnosis not present

## 2016-07-20 ENCOUNTER — Other Ambulatory Visit: Payer: Self-pay | Admitting: Family Medicine

## 2016-07-25 ENCOUNTER — Telehealth: Payer: Self-pay | Admitting: Family Medicine

## 2016-07-25 NOTE — Telephone Encounter (Signed)
Left VM for Pt regarding a recall on her Lomotil Rx. Per insurance letter, the Rx could be super- or sub- potent. Advised Pt to take Rx to pharmacy to get a new supply. Callback information provided for any questions.

## 2016-08-03 ENCOUNTER — Ambulatory Visit (INDEPENDENT_AMBULATORY_CARE_PROVIDER_SITE_OTHER): Payer: Medicare Other | Admitting: Family Medicine

## 2016-08-03 VITALS — BP 118/64

## 2016-08-03 DIAGNOSIS — I701 Atherosclerosis of renal artery: Secondary | ICD-10-CM | POA: Diagnosis not present

## 2016-08-03 DIAGNOSIS — G4700 Insomnia, unspecified: Secondary | ICD-10-CM | POA: Diagnosis not present

## 2016-08-03 DIAGNOSIS — F331 Major depressive disorder, recurrent, moderate: Secondary | ICD-10-CM

## 2016-08-03 DIAGNOSIS — E1140 Type 2 diabetes mellitus with diabetic neuropathy, unspecified: Secondary | ICD-10-CM | POA: Diagnosis not present

## 2016-08-03 DIAGNOSIS — I10 Essential (primary) hypertension: Secondary | ICD-10-CM | POA: Diagnosis not present

## 2016-08-03 DIAGNOSIS — J410 Simple chronic bronchitis: Secondary | ICD-10-CM

## 2016-08-03 LAB — POCT GLYCOSYLATED HEMOGLOBIN (HGB A1C): HEMOGLOBIN A1C: 6.3

## 2016-08-03 MED ORDER — LEVOTHYROXINE SODIUM 100 MCG PO TABS: 100.0000 ug | ORAL_TABLET | Freq: Every day | ORAL | 1 refills | Status: DC

## 2016-08-03 MED ORDER — BUPROPION HCL ER (XL) 150 MG PO TB24: 150.0000 mg | ORAL_TABLET | ORAL | 2 refills | Status: DC

## 2016-08-03 MED ORDER — ZOLPIDEM TARTRATE 5 MG PO TABS: 5.0000 mg | ORAL_TABLET | Freq: Every day | ORAL | 1 refills | Status: DC

## 2016-08-03 NOTE — Progress Notes (Signed)
Subjective:    CC: HTN  HPI:  Hypertension- Pt denies chest pain, SOB, dizziness, or heart palpitations.  Taking meds as directed w/o problems.  Denies medication side effects.    Insomnia - Taking Ambien 5 mg nightly.Denies any side effects or problems with the medication she does need refills today.  COPD - No recent flares or exacerbations.  Diabetes - no hypoglycemic events. No wounds or sores that are not healing well. No increased thirst or urination. Checking glucose at home. Taking medications as prescribed without any side effects. She admits she's been getting into some Christmas cookies lately.  Major depressive disorder-she's currently on fluoxetine 80 mg and says she just feels extremely depressed. She said normally she is an extremely clean person most excessively and says more recently she hasn't even wanted to clean her house. She loves reading and hasn't been able to really enjoy a book in several weeks. She says she hasn't felt this depressed in a very long time. She says therapy and counseling has not been helpful for her in the past.  Unfortunately her peripheral neuropathy is actually getting worse. She has chronic inflammatory edema leading polyneuropathy. The infusions that she were getting did not actually improve or slow the progression of her disease process. So now they are trying a compounded supplement that she gets from a pharmacy in [place] hoping that it will help at least slow the progression of the neuropathy.  She also has a couple moles that she would like me to look at on her abdomen. She says not painful or bothersome but her daughter was worried about the appearance.  Past medical history, Surgical history, Family history not pertinant except as noted below, Social history, Allergies, and medications have been entered into the medical record, reviewed, and corrections made.   Review of Systems: No fevers, chills, night sweats, weight loss, chest pain, or  shortness of breath.   Objective:    General: Well Developed, well nourished, and in no acute distress.  Neuro: Alert and oriented x3, extra-ocular muscles intact, sensation grossly intact.  HEENT: Normocephalic, atraumatic, TMs and canals are clear that she does have a fair amount of cerumen  Skin: Warm and dry, no rashes.She does have 3 seborrheic keratoses on her left lower abdomen. Cardiac: Regular rate and rhythm, no murmurs rubs or gallops, no lower extremity edema.  Respiratory: Clear to auscultation bilaterally. Not using accessory muscles, speaking in full sentences.   Impression and Recommendations:    COPD- Stable. No recent flares.  DM- Well controlled every 11 A1c is up to 6.3 from previous. Encouraged her to make sure that she is watching her diet and working on more regular exercise.  Major depressive disorder-discussed options. She declines therapy or counseling. The fluoxetine has worked really well for her up until recently. We'll try addition of Wellbutrin. If not effective then consider one of the newer agents such as Cymbalta or Viibryd. PHQ 9 score of 21 today. She denies any suicidal thoughts. Though she does rate her symptoms as very difficult.  HTN -  Well controlled. Continue current regimen. Follow up in  6 months.     Seborrheic keratoses-discussed benign nature of these lesions but certainly they can be removed if they are irritating.  Insomnia-refilled Ambien today.

## 2016-08-09 ENCOUNTER — Telehealth: Payer: Self-pay

## 2016-08-09 NOTE — Telephone Encounter (Signed)
[name] called and states the Wellbutrin is causing her to have "brain fog" and she feels like she is losing her mind. She has stopped taking the medication. She doesn't want a replacement medication. She will follow up at the end of the month.

## 2016-08-09 NOTE — Telephone Encounter (Signed)
Ok, sounds good.

## 2016-08-14 ENCOUNTER — Other Ambulatory Visit: Payer: Self-pay | Admitting: Family Medicine

## 2016-08-31 ENCOUNTER — Ambulatory Visit: Payer: Medicare Other | Admitting: Family Medicine

## 2016-09-06 DIAGNOSIS — M5136 Other intervertebral disc degeneration, lumbar region: Secondary | ICD-10-CM | POA: Diagnosis not present

## 2016-09-06 DIAGNOSIS — Z79891 Long term (current) use of opiate analgesic: Secondary | ICD-10-CM | POA: Diagnosis not present

## 2016-09-06 DIAGNOSIS — M47816 Spondylosis without myelopathy or radiculopathy, lumbar region: Secondary | ICD-10-CM | POA: Diagnosis not present

## 2016-09-06 DIAGNOSIS — G894 Chronic pain syndrome: Secondary | ICD-10-CM | POA: Diagnosis not present

## 2016-09-08 ENCOUNTER — Telehealth: Payer: Self-pay | Admitting: *Deleted

## 2016-09-08 DIAGNOSIS — E1140 Type 2 diabetes mellitus with diabetic neuropathy, unspecified: Secondary | ICD-10-CM

## 2016-09-08 MED ORDER — BLOOD GLUCOSE MONITOR KIT: PACK | 0 refills | Status: DC

## 2016-09-08 NOTE — Telephone Encounter (Signed)
rx faxed.Anthany Thornhill Lynetta  

## 2016-09-08 NOTE — Telephone Encounter (Signed)
Pt called and lvm stating that she dropped her glucose meter in the sink and will need a new rx sent for a new one. She would like a new meter, strips and lancets sent to [facility].Maryruth Eve, [name]

## 2016-09-22 ENCOUNTER — Encounter (HOSPITAL_COMMUNITY): Payer: Medicare Other

## 2016-09-22 ENCOUNTER — Ambulatory Visit: Payer: Medicare Other | Admitting: Family

## 2016-09-27 ENCOUNTER — Other Ambulatory Visit: Payer: Self-pay | Admitting: Family Medicine

## 2016-09-29 ENCOUNTER — Encounter: Payer: Self-pay | Admitting: Vascular Surgery

## 2016-10-02 ENCOUNTER — Other Ambulatory Visit: Payer: Self-pay | Admitting: Family Medicine

## 2016-10-06 ENCOUNTER — Encounter (HOSPITAL_COMMUNITY): Payer: Medicare Other

## 2016-10-06 ENCOUNTER — Ambulatory Visit: Payer: Medicare Other | Admitting: Family

## 2016-10-06 ENCOUNTER — Inpatient Hospital Stay (HOSPITAL_COMMUNITY): Admission: RE | Admit: 2016-10-06 | Payer: Medicare Other | Source: Ambulatory Visit

## 2016-10-17 ENCOUNTER — Other Ambulatory Visit: Payer: Self-pay | Admitting: Family Medicine

## 2016-10-27 ENCOUNTER — Other Ambulatory Visit: Payer: Self-pay | Admitting: Family Medicine

## 2016-11-01 DIAGNOSIS — Z79891 Long term (current) use of opiate analgesic: Secondary | ICD-10-CM | POA: Diagnosis not present

## 2016-11-01 DIAGNOSIS — M47816 Spondylosis without myelopathy or radiculopathy, lumbar region: Secondary | ICD-10-CM | POA: Diagnosis not present

## 2016-11-01 DIAGNOSIS — G894 Chronic pain syndrome: Secondary | ICD-10-CM | POA: Diagnosis not present

## 2016-11-01 DIAGNOSIS — M5136 Other intervertebral disc degeneration, lumbar region: Secondary | ICD-10-CM | POA: Diagnosis not present

## 2016-11-14 ENCOUNTER — Other Ambulatory Visit: Payer: Self-pay

## 2016-11-14 ENCOUNTER — Ambulatory Visit (INDEPENDENT_AMBULATORY_CARE_PROVIDER_SITE_OTHER): Payer: Medicare Other | Admitting: Family Medicine

## 2016-11-14 ENCOUNTER — Ambulatory Visit (INDEPENDENT_AMBULATORY_CARE_PROVIDER_SITE_OTHER): Payer: Medicare Other

## 2016-11-14 VITALS — BP 129/74 | HR 49

## 2016-11-14 DIAGNOSIS — S3992XA Unspecified injury of lower back, initial encounter: Secondary | ICD-10-CM | POA: Diagnosis not present

## 2016-11-14 DIAGNOSIS — M5136 Other intervertebral disc degeneration, lumbar region: Secondary | ICD-10-CM | POA: Diagnosis not present

## 2016-11-14 DIAGNOSIS — M545 Low back pain, unspecified: Secondary | ICD-10-CM

## 2016-11-14 DIAGNOSIS — M533 Sacrococcygeal disorders, not elsewhere classified: Secondary | ICD-10-CM

## 2016-11-14 DIAGNOSIS — S0992XA Unspecified injury of nose, initial encounter: Secondary | ICD-10-CM

## 2016-11-14 DIAGNOSIS — M79662 Pain in left lower leg: Secondary | ICD-10-CM | POA: Diagnosis not present

## 2016-11-14 DIAGNOSIS — W19XXXA Unspecified fall, initial encounter: Secondary | ICD-10-CM

## 2016-11-14 DIAGNOSIS — S8992XA Unspecified injury of left lower leg, initial encounter: Secondary | ICD-10-CM | POA: Diagnosis not present

## 2016-11-14 DIAGNOSIS — M549 Dorsalgia, unspecified: Secondary | ICD-10-CM | POA: Diagnosis not present

## 2016-11-14 DIAGNOSIS — K591 Functional diarrhea: Secondary | ICD-10-CM

## 2016-11-14 DIAGNOSIS — J3489 Other specified disorders of nose and nasal sinuses: Secondary | ICD-10-CM | POA: Diagnosis not present

## 2016-11-14 MED ORDER — LIDOCAINE 5 % EX OINT: 1.0000 "application " | TOPICAL_OINTMENT | Freq: Every day | CUTANEOUS | 11 refills | Status: DC

## 2016-11-14 MED ORDER — DIPHENOXYLATE-ATROPINE 2.5-0.025 MG PO TABS: 1.0000 | ORAL_TABLET | Freq: Two times a day (BID) | ORAL | 0 refills | Status: DC | PRN

## 2016-11-14 NOTE — Progress Notes (Signed)
[name] is a 71 y.o. female who presents to [facility] today for fall and injury.   [name] fell at home 1 week ago. She fell forward when trying to pick up an object. She did not faint and denies feeling palpitations or chest pain prior to the fall. She had her nose bilateral knees. She notes bruising and pain especially at the left knee. She also notes some pain at the bridge of her nose. However she does note significant low back pain and pain into her sacrum. She denies any radiating pain. She notes the back pain is quite severe and not controlled with her chronic Norco. She denies any confusion or change in behavior. Her daughter thinks that she is behaving normally as well. She would like to avoid a head CT scan if possible.   Past Medical History:  Diagnosis Date  . Arthritis   . Charcot-Marie-Tooth disease   . COPD (chronic obstructive pulmonary disease) ([place])   . DDD (degenerative disc disease)    Lumbar and lumbosacral  . Depression   . Diabetes mellitus    type 2  . Diabetic peripheral neuropathy ([place])   . Facet syndrome, lumbar   . Gastric ulcer    w/o hemorrhage  . Hyperlipidemia   . Hypertension   . Insomnia   . Lumbosacral root lesions, not elsewhere classified   . Neurogenic bladder   . Obesity   . Osteopenia   . Other symptoms referable to back   . Post-menopausal   . PUD (peptic ulcer disease)   . 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 Spinal stenosis, lumbar region, without neurogenic claudication   . Thoracic spondylosis without myelopathy   . Thyroid disease    hypo  . Tobacco dependence   . Unspecified hereditary and idiopathic peripheral neuropathy    Past Surgical History:  Procedure Laterality Date  . ANKLE RECONSTRUCTION     left ankle, plate and pins   . bilateral L3 medial branch block    . CHOLECYSTECTOMY    . fibroid tumor removal    . fluoroscopic L5 dorsal medial  branch bloc    . RENAL ARTERY STENT  11/28/2010   left  . REPLACEMENT TOTAL KNEE     Social History  Substance Use Topics  . Smoking status: Current Every Day Smoker    Packs/day: 0.75    Years: 49.00    Types: Cigarettes  . Smokeless tobacco: Never Used     Comment: pt states she is going to try the E-cig  . Alcohol use No     ROS:  As above   Medications: Current Outpatient Prescriptions  Medication Sig Dispense Refill  . alendronate (FOSAMAX) 70 MG tablet TAKE 1 TAB EVERY 7 DAYS. TAKE IN THE MORNING WITH A FULL GLASS OF WATER ON AN EMPTY STOMACH. DO NOT INGEST ANY OTHER FOOD OR DRINK OR LIE D 12 tablet 4  . aspirin EC 81 MG tablet Take 81 mg by mouth daily.    . Azelastine HCl 0.15 % SOLN Place 2 sprays into the nose 2 (two) times daily. 30 mL 4  . blood glucose meter kit and supplies KIT Dispense based on patient and insurance preference. Use up to four times daily as directed Dx E11.40 1 each 0  . buPROPion (WELLBUTRIN XL) 150 MG 24 hr tablet Take 1 tablet (150 mg total) by mouth every morning. 30 tablet 2  .  clopidogrel (PLAVIX) 75 MG tablet TAKE 1 TABLET BY MOUTH EVERY DAY 30 tablet 11  . dicyclomine (BENTYL) 20 MG tablet Take 1 tablet (20 mg total) by mouth 3 (three) times daily as needed for spasms. 90 tablet 1  . FLUoxetine (PROZAC) 40 MG capsule TAKE TWO CAPSULES EVERY DAY 60 capsule 2  . furosemide (LASIX) 40 MG tablet TAKE 1 OR 2 TABLETS EVERY MORNING. 90 tablet 0  . gabapentin (NEURONTIN) 400 MG capsule TAKE ONE CAPSULE THREE TIMES DAILY AS NEEDED (Patient taking differently: 4 (four) times daily as needed. TAKE ONE CAPSULE THREE TIMES DAILY AS NEEDED) 270 capsule 1  . glipiZIDE (GLUCOTROL) 5 MG tablet Take 1 tablet (5 mg total) by mouth daily before breakfast. 90 tablet 3  . glucose blood test strip 1 each as needed. Use as instructed     . HYDROcodone-acetaminophen (NORCO) 10-325 MG per tablet Take 1 tablet by mouth every 6 (six) hours as needed. Do not take more  that 5 tabs a day    . Lancets MISC by Does not apply route.      Marland Kitchen LANTUS SOLOSTAR 100 UNIT/ML Solostar Pen inject 48 UNITS daily 15 mL 6  . metFORMIN (GLUCOPHAGE) 500 MG tablet Take 1 tablet (500 mg total) by mouth 3 (three) times daily. 270 tablet 3  . metoprolol (LOPRESSOR) 100 MG tablet TAKE ONE TABLET TWICE DAILY 60 tablet 4  . nitroGLYCERIN (NITROSTAT) 0.4 MG SL tablet Place 1 tablet (0.4 mg total) under the tongue every 5 (five) minutes as needed. 10 tablet 1  . simvastatin (ZOCOR) 40 MG tablet Take 1 tablet (40 mg total) by mouth daily at 6 PM. 30 tablet 4  . tiotropium (SPIRIVA) 18 MCG inhalation capsule Place 1 capsule (18 mcg total) into inhaler and inhale daily. 90 capsule 3  . VENTOLIN HFA 108 (90 BASE) MCG/ACT inhaler Inhale 2 puffs into the lungs every 6 (six) hours as needed for wheezi ng. 18 each 3  . zolpidem (AMBIEN) 5 MG tablet Take 1 tablet (5 mg total) by mouth at bedtime. 30 tablet 1  . diphenoxylate-atropine (LOMOTIL) 2.5-0.025 MG tablet Take 1-2 tablets by mouth 2 (two) times daily as needed for diarrhea or loose stools. 60 tablet 0  . levothyroxine (SYNTHROID, LEVOTHROID) 100 MCG tablet Take 1 tablet (100 mcg total) by mouth daily before breakfast. 90 tablet 1  . lidocaine (XYLOCAINE) 5 % ointment Apply 1 application topically daily. 2 hours before skin procedure. 50 g 11   No current facility-administered medications for this visit.    Allergies  Allergen Reactions  . Varenicline Nausea And Vomiting     Exam:  BP 129/74   Pulse (!) 49  General: Well Developed, well nourished, and in no acute distress.  Neuro/Psych: Alert and oriented x3, extra-ocular muscles intact, able to move all 4 extremities, sensation grossly intact. Patient speaks normally. Skin: Warm and dry, no rashes noted.  Respiratory: Not using accessory muscles, speaking in full sentences, trachea midline.  Cardiovascular: Pulses palpable, no extremity edema. Abdomen: Does not appear  distended. MSK:  Head: Ecchymosis present on nose. Bridge of nose tender. No deformity present. C-spine nontender normal neck motion. L-spine nontender to midline. Tender palpation bilateral lumbar paraspinal muscles.  Sacrum and coccyx is mildly tender along the midline Left lower extremity with well-appearing mature scar on the anterior aspect of the left knee. Large ecchymosis present mid anterior left shin. Knee motion is normal. Lower extremity sensation is intact throughout.   X-ray nasal  bones shows no obvious fractures. X-ray L-spine shows diffuse degeneration no new acute fracture. Considerable arterial sclerosis present aorta and iliac arteries.  X-ray left tib-fib shows well-appearing total knee replacement and well-appearing surgical hardware in the ankle. No obvious fracture. X-ray sacrum shows no obvious fracture.  Awaiting formal radiology review  No results found for this or any previous visit (from the past 48 hour(s)). No results found.    Assessment and Plan: 71 y.o. female with mechanical fall with resulting hematoma and contusion. No obvious fractures on x-rays today. Radiology review pending. Discussed options. Patient declined CT scan of her head. Plan for relative rest advancing activity as tolerated and treatment with continued home Norco. Use a heating pad. Patient declined referral to physical therapy. Follow-up with primary care provider.  Follow-up with PCP regarding arterial sclerosis    Orders Placed This Encounter  Procedures  . DG Lumbar Spine Complete    Standing Status:   Future    Number of Occurrences:   1    Standing Expiration Date:   01/14/2018    Order Specific Question:   Reason for Exam (SYMPTOM  OR DIAGNOSIS REQUIRED)    Answer:   Eval BL low back pain following fall    Order Specific Question:   Preferred imaging location?    Answer:   [facility]    Order Specific Question:   Radiology Contrast Protocol - do NOT remove  file path    Answer:   \\[email or web address]  . DG Sacrum/Coccyx    Standing Status:   Future    Number of Occurrences:   1    Standing Expiration Date:   01/14/2018    Order Specific Question:   Reason for Exam (SYMPTOM  OR DIAGNOSIS REQUIRED)    Answer:   eval sacrum pain following fall    Order Specific Question:   Preferred imaging location?    Answer:   [facility]    Order Specific Question:   Radiology Contrast Protocol - do NOT remove file path    Answer:   \\[email or web address]  . DG Tibia/Fibula Left    Standing Status:   Future    Number of Occurrences:   1    Standing Expiration Date:   01/14/2018    Order Specific Question:   Reason for Exam (SYMPTOM  OR DIAGNOSIS REQUIRED)    Answer:   eval left leg pain following fall    Order Specific Question:   Preferred imaging location?    Answer:   [facility]    Order Specific Question:   Radiology Contrast Protocol - do NOT remove file path    Answer:   \\[email or web address].[email or web address]  . DG Nasal Bones    Standing Status:   Future    Number of Occurrences:   1    Standing Expiration Date:   01/14/2018    Order Specific Question:   Reason for Exam (SYMPTOM  OR DIAGNOSIS REQUIRED)    Answer:   eval ? broken nose after fall    Order Specific Question:   Preferred imaging location?    Answer:   [facility]    Order Specific Question:   Radiology Contrast Protocol - do NOT remove file path    Answer:   \\[email or web address]    Discussed warning signs or symptoms. Please see discharge instructions. Patient expresses understanding.

## 2016-11-14 NOTE — Patient Instructions (Signed)
Thank you for coming in today. Use a walker.  I recommend PT.  Use a heating pad.  Recheck in 2-4 weeks if not better.  Return sooner if needed.   Come back or go to the emergency room if you notice new weakness new numbness problems walking or bowel or bladder problems.   Contusion A contusion is a deep bruise. Contusions happen when an injury causes bleeding under the skin. Symptoms of bruising include pain, swelling, and discolored skin. The skin may turn blue, purple, or yellow. Follow these instructions at home:  Rest the injured area.  If told, put ice on the injured area.  Put ice in a plastic bag.  Place a towel between your skin and the bag.  Leave the ice on for 20 minutes, 2-3 times per day.  If told, put light pressure (compression) on the injured area using an elastic bandage. Make sure the bandage is not too tight. Remove it and put it back on as told by your doctor.  If possible, raise (elevate) the injured area above the level of your heart while you are sitting or lying down.  Take over-the-counter and prescription medicines only as told by your doctor. Contact a doctor if:  Your symptoms do not get better after several days of treatment.  Your symptoms get worse.  You have trouble moving the injured area. Get help right away if:  You have very bad pain.  You have a loss of feeling (numbness) in a hand or foot.  Your hand or foot turns pale or cold. This information is not intended to replace advice given to you by your health care provider. Make sure you discuss any questions you have with your health care provider. Document Released: 01/10/2008 Document Revised: 12/30/2015 Document Reviewed: 12/09/2014 Elsevier Interactive Patient Education  2017 [employer].

## 2016-11-22 ENCOUNTER — Telehealth: Payer: Self-pay | Admitting: Family Medicine

## 2016-11-22 NOTE — Telephone Encounter (Signed)
I informed pt she is due for a f/u appt with Dr.Metheney on her mood and meds and she states she will have to call back to schedule

## 2016-11-27 ENCOUNTER — Other Ambulatory Visit: Payer: Self-pay | Admitting: Family Medicine

## 2016-12-05 ENCOUNTER — Encounter: Payer: Self-pay | Admitting: Family Medicine

## 2016-12-05 ENCOUNTER — Ambulatory Visit (INDEPENDENT_AMBULATORY_CARE_PROVIDER_SITE_OTHER): Payer: Medicare Other | Admitting: Family Medicine

## 2016-12-05 VITALS — BP 104/58 | HR 59 | Ht 67.0 in

## 2016-12-05 DIAGNOSIS — I10 Essential (primary) hypertension: Secondary | ICD-10-CM | POA: Diagnosis not present

## 2016-12-05 DIAGNOSIS — E1140 Type 2 diabetes mellitus with diabetic neuropathy, unspecified: Secondary | ICD-10-CM | POA: Diagnosis not present

## 2016-12-05 DIAGNOSIS — J449 Chronic obstructive pulmonary disease, unspecified: Secondary | ICD-10-CM | POA: Diagnosis not present

## 2016-12-05 DIAGNOSIS — W19XXXD Unspecified fall, subsequent encounter: Secondary | ICD-10-CM | POA: Diagnosis not present

## 2016-12-05 LAB — POCT GLYCOSYLATED HEMOGLOBIN (HGB A1C): HEMOGLOBIN A1C: 5.2

## 2016-12-05 LAB — POCT UA - MICROALBUMIN
CREATININE, POC: 50 mg/dL
Microalbumin Ur, POC: 10 mg/L

## 2016-12-05 MED ORDER — FLUOXETINE HCL 40 MG PO CAPS: 80.0000 mg | ORAL_CAPSULE | Freq: Every day | ORAL | 3 refills | Status: DC

## 2016-12-05 MED ORDER — AMBULATORY NON FORMULARY MEDICATION: 0 refills | Status: DC

## 2016-12-05 NOTE — Progress Notes (Signed)
Subjective:    CC: recent Fall, DM, HTN   HPI:  71 year old female comes in today to follow-up on recent fall. She fell around April 3 at home and then came in to see one of our sports medicine providers. She fell forward when trying to pick up an object. She did not lose consciousness. She hit her nose on her knees. And she had some bruising and pain particularly over the left knee. So complain of some low back pain into the sacral area. She declined to have a CT scan but did have x-rays performed. No nasal fracture seen on x-rays. Significant arthritis in the lumbar spine and sacrum but no fractures. And no fracture of the tibia fibula. She is Artie on chronic hydrocodone.  Hypertension- Pt denies chest pain, SOB, dizziness, or heart palpitations.  Taking meds as directed w/o problems.  Denies medication side effects.    Diabetes - no hypoglycemic events. No wounds or sores that are not healing well. No increased thirst or urination. Checking glucose at home. Taking medications as prescribed without any side effects. Marland Kitchen MDD - Her fluoxetine was refilled it was only filled for 1 tab a day and she normally takes 80 mg a day. Try adding Wellbutrin back in December. We discussed that if it wasn't effective that we might consider Cymbalta or Viibryd.. She never actually started the Wellbutrin. She says she the pharmacy never filled it.  COPD-she's also noticed that lately she's had increase in cough and occasional sputum production. There does seem to come and go. Sometimes it feels dry and tight sometimes she is noticing more mucous. She does use her Spiriva repeat regularly. She's not had any other cold or upper respiratory symptoms.  Past medical history, Surgical history, Family history not pertinant except as noted below, Social history, Allergies, and medications have been entered into the medical record, reviewed, and corrections made.   Review of Systems: No fevers, chills, night sweats,  weight loss, chest pain, or shortness of breath.   Objective:    General: Well Developed, well nourished, and in no acute distress.  Neuro: Alert and oriented x3, extra-ocular muscles intact, sensation grossly intact.  HEENT: Normocephalic, atraumatic.  Still just a little tender over the nasal bridge to palpation. But I do not feel any crepitus. Skin: Warm and dry, no rashes. Cardiac: Regular rate and rhythm, no murmurs rubs or gallops, no lower extremity edema.  Respiratory: Clear to auscultation bilaterally. Not using accessory muscles, speaking in full sentences.   Impression and Recommendations:    REcent fall. Overall she is doing better. She still has some low back pain but that is chronic. But feels like she is close to her baseline again. She still has some tenderness over the bridge of her nose and is still sore to touch.  HTN - well controlled. In fact blood pressures a little low today. She may need to skip her Lasix for a day.  DM- Well controlled. Common A1c of 5.2 today. In fact she needs to monitor for any hypoglycemic events. She can certainly call me if we need to make adjustments to her medication regimen. Otherwise I will see her back in about 4 months.  Urine microalbumin performed today.  MDD - Improved. She wants hold off on the Wellbutrin.  PHQ 9 score of 5 today which is significantly improved from previous back in December. Rates her symptoms as not difficult at all. We'll remove Wellbutrin from her medication list. 2 to monitor. Week  follow-up in 6 months.  COPD-with recent changes with increased cough and sputum production. She's currently on Spiriva daily and uses her albuterol as needed. We'll get her scheduled for spirometry in the next few weeks for further evaluation. It sounds like we may need to change her maintenance medication regimen. It also like to do an overnight pulse ox.

## 2016-12-05 NOTE — Patient Instructions (Addendum)
Schedule for spirometry when it is convenient for you.   Your blood pressure is a little low today. Skip your next dose of furosemide

## 2016-12-06 ENCOUNTER — Other Ambulatory Visit: Payer: Self-pay | Admitting: Family Medicine

## 2016-12-12 DIAGNOSIS — J449 Chronic obstructive pulmonary disease, unspecified: Secondary | ICD-10-CM | POA: Diagnosis not present

## 2016-12-13 DIAGNOSIS — J449 Chronic obstructive pulmonary disease, unspecified: Secondary | ICD-10-CM | POA: Diagnosis not present

## 2016-12-15 ENCOUNTER — Other Ambulatory Visit: Payer: Self-pay | Admitting: Family Medicine

## 2016-12-25 ENCOUNTER — Ambulatory Visit (INDEPENDENT_AMBULATORY_CARE_PROVIDER_SITE_OTHER): Payer: Medicare Other | Admitting: Family Medicine

## 2016-12-25 VITALS — BP 109/63 | HR 57 | Ht 67.0 in | Wt 190.0 lb

## 2016-12-25 DIAGNOSIS — R0602 Shortness of breath: Secondary | ICD-10-CM | POA: Diagnosis not present

## 2016-12-25 DIAGNOSIS — J449 Chronic obstructive pulmonary disease, unspecified: Secondary | ICD-10-CM

## 2016-12-25 LAB — PULMONARY FUNCTION TEST

## 2016-12-25 MED ORDER — ALBUTEROL SULFATE (2.5 MG/3ML) 0.083% IN NEBU
2.5000 mg | INHALATION_SOLUTION | Freq: Once | RESPIRATORY_TRACT | Status: AC
  Administered 2016-12-25: 2.5 mg via RESPIRATORY_TRACT

## 2016-12-25 NOTE — Progress Notes (Signed)
   Subjective:    Patient ID: [name], female    DOB: March 19, 1946, 71 y.o.   MRN: [id number]  HPI 72 year old female comes in today for spirometry for her COPD. She's had some recent changes with increased cough and sputum production. Currently on Spiriva and albuterol as needed. We asked her come in today first spirometry. Also ordered an overnight pulse ox. Habit her back on the results on this yet. She complained about increasing cough and sputum production in general.   Review of Systems     Objective:   Physical Exam  Constitutional: She is oriented to person, place, and time. She appears well-developed and well-nourished.  HENT:  Head: Normocephalic and atraumatic.  Cardiovascular: Normal rate, regular rhythm and normal heart sounds.   Pulmonary/Chest: Effort normal and breath sounds normal.  Neurological: She is alert and oriented to person, place, and time.  Skin: Skin is warm and dry.  Psychiatric: She has a normal mood and affect. Her behavior is normal.        Assessment & Plan:  Moderate COPD, group D-  Reviewed spirometry results. Spirometry shows FVC of 88%, FEV1 of 72% with a ratio of 61 most consistent with moderate obstruction. No significant improvement in FEV1 after albuterol. She did have 70% improvement in her FEF 25-75. We discussed trying to use her albuterol just a little bit more liberally. She says it's been months and she's even use it. I want her to pretreat before she is more physically active, for example if she's can do more heavy housework or pressure and do a couple puffs of albuterol ahead of time to see if this helps.  CAT Score 12/25/2016  Total CAT Score 19

## 2016-12-26 ENCOUNTER — Telehealth: Payer: Self-pay | Admitting: Family Medicine

## 2016-12-26 NOTE — Telephone Encounter (Signed)
Call patient: I did receive her overnight oxygen study. Her oxygen was less than 89% for approximately 23 minutes. This would qualify her for overnight oxygen. In addition I think she would benefit from a home sleep study to evaluate for sleep apnea surgery gone to call and get that scheduled through [facility].

## 2016-12-27 NOTE — Telephone Encounter (Signed)
Pt informed and is ok with overnight O2 and sleep study.[name] [place]

## 2017-01-08 ENCOUNTER — Other Ambulatory Visit: Payer: Self-pay | Admitting: Family Medicine

## 2017-01-08 DIAGNOSIS — Z79891 Long term (current) use of opiate analgesic: Secondary | ICD-10-CM | POA: Diagnosis not present

## 2017-01-08 DIAGNOSIS — G894 Chronic pain syndrome: Secondary | ICD-10-CM | POA: Diagnosis not present

## 2017-01-08 DIAGNOSIS — M5136 Other intervertebral disc degeneration, lumbar region: Secondary | ICD-10-CM | POA: Diagnosis not present

## 2017-01-08 DIAGNOSIS — M47816 Spondylosis without myelopathy or radiculopathy, lumbar region: Secondary | ICD-10-CM | POA: Diagnosis not present

## 2017-01-22 ENCOUNTER — Other Ambulatory Visit: Payer: Self-pay | Admitting: Family Medicine

## 2017-01-24 ENCOUNTER — Other Ambulatory Visit: Payer: Self-pay | Admitting: Family Medicine

## 2017-01-24 DIAGNOSIS — G4733 Obstructive sleep apnea (adult) (pediatric): Secondary | ICD-10-CM | POA: Diagnosis not present

## 2017-02-05 ENCOUNTER — Encounter: Payer: Self-pay | Admitting: Family Medicine

## 2017-02-05 ENCOUNTER — Other Ambulatory Visit: Payer: Self-pay | Admitting: Family Medicine

## 2017-02-05 DIAGNOSIS — G4733 Obstructive sleep apnea (adult) (pediatric): Secondary | ICD-10-CM | POA: Insufficient documentation

## 2017-02-26 ENCOUNTER — Other Ambulatory Visit: Payer: Self-pay | Admitting: Family Medicine

## 2017-02-27 ENCOUNTER — Telehealth: Payer: Self-pay | Admitting: Family Medicine

## 2017-02-27 NOTE — Telephone Encounter (Signed)
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 She would benefit from CPAP with 2 liter oxygen at night.

## 2017-02-28 NOTE — Telephone Encounter (Signed)
Pt informed and stated that it does NOT have O2. And she doesn't sleep soundly due to the loudness of the machine. Will fwd to pcp for advice.Elouise Munroe'

## 2017-03-01 NOTE — Telephone Encounter (Signed)
I am confused. Does she already have a CPAP machine. If not then we can fax a script.

## 2017-03-05 NOTE — Telephone Encounter (Signed)
Pt states she does have a CPAP machine from [facility]. It does not have oxygen on it. Pt states she starts using the machine, but she does remove it about halfway through the night due to the loudness of the machine. Routing to Provider for review on next steps.

## 2017-03-06 MED ORDER — AMBULATORY NON FORMULARY MEDICATION: 0 refills | Status: DC

## 2017-03-06 NOTE — Telephone Encounter (Signed)
Based on PCP's notes, Oxygen at 2L/min through the CPAP device was advised.  Non-urgent issue, if we can take care of this order for O2, let's do it - I signed the order. Or patient can discuss with PCP - please have her schedule a viist if additional questions or concerns.

## 2017-03-06 NOTE — Telephone Encounter (Signed)
Order faxed for [facility].

## 2017-03-07 DIAGNOSIS — M47816 Spondylosis without myelopathy or radiculopathy, lumbar region: Secondary | ICD-10-CM | POA: Diagnosis not present

## 2017-03-07 DIAGNOSIS — Z79891 Long term (current) use of opiate analgesic: Secondary | ICD-10-CM | POA: Diagnosis not present

## 2017-03-07 DIAGNOSIS — G894 Chronic pain syndrome: Secondary | ICD-10-CM | POA: Diagnosis not present

## 2017-03-07 DIAGNOSIS — M5136 Other intervertebral disc degeneration, lumbar region: Secondary | ICD-10-CM | POA: Diagnosis not present

## 2017-03-09 ENCOUNTER — Other Ambulatory Visit: Payer: Self-pay

## 2017-03-09 MED ORDER — AMBULATORY NON FORMULARY MEDICATION: 0 refills | Status: DC

## 2017-03-09 NOTE — Progress Notes (Signed)
New Rx required per [facility].

## 2017-03-15 ENCOUNTER — Ambulatory Visit (INDEPENDENT_AMBULATORY_CARE_PROVIDER_SITE_OTHER): Payer: Medicare Other | Admitting: Family Medicine

## 2017-03-15 ENCOUNTER — Encounter: Payer: Self-pay | Admitting: Family Medicine

## 2017-03-15 ENCOUNTER — Other Ambulatory Visit: Payer: Self-pay | Admitting: Family Medicine

## 2017-03-15 VITALS — BP 148/56 | HR 58 | Wt 190.0 lb

## 2017-03-15 DIAGNOSIS — L84 Corns and callosities: Secondary | ICD-10-CM

## 2017-03-15 DIAGNOSIS — R29898 Other symptoms and signs involving the musculoskeletal system: Secondary | ICD-10-CM

## 2017-03-15 DIAGNOSIS — J449 Chronic obstructive pulmonary disease, unspecified: Secondary | ICD-10-CM | POA: Diagnosis not present

## 2017-03-15 DIAGNOSIS — E1142 Type 2 diabetes mellitus with diabetic polyneuropathy: Secondary | ICD-10-CM

## 2017-03-15 DIAGNOSIS — G4733 Obstructive sleep apnea (adult) (pediatric): Secondary | ICD-10-CM | POA: Diagnosis not present

## 2017-03-15 DIAGNOSIS — L603 Nail dystrophy: Secondary | ICD-10-CM | POA: Diagnosis not present

## 2017-03-15 LAB — POCT GLYCOSYLATED HEMOGLOBIN (HGB A1C): Hemoglobin A1C: 5.4

## 2017-03-15 MED ORDER — AMBULATORY NON FORMULARY MEDICATION: 0 refills | Status: DC

## 2017-03-15 MED ORDER — GLIPIZIDE 5 MG PO TABS: ORAL_TABLET | ORAL | 3 refills | Status: DC

## 2017-03-15 NOTE — Telephone Encounter (Signed)
Called [facility] spoke to [facility] she stated that it looks like the patient has been taking off of CPAP and just laying it down but what little information she do have she will fax it over and it will be placed in your basket. Rhonda Cunningham,CMA

## 2017-03-15 NOTE — Progress Notes (Signed)
Subjective:    CC: DM, COPD  HPI:  Diabetes - no hypoglycemic events. No wounds or sores that are not healing well. No increased thirst or urination. Checking glucose at home. Taking medications as prescribed without any side effects. Has her eye exam schedule in about 2 weeks.    F/U COPD - Stable doing well.    OSA - She still really struggling to wear the CPAP. She says she's been trying most nights but really can keep it on for long. I asked her how long an hour or 2 hours. She really wasn't able to quantify. She has no nasal mask. She says the pressure will start out so high that it actually will fill up her cheeks and her mouth and in the pressure will go down.  She also has a corn that she cannot get to go away and would like referral to podiatry she also needs some nail care as well.  Extremities weakness-she's also asking for prescription for new rolling walker. She has her walker here with her today and says that the wires on the brakes are getting bent and worn out.  Past medical history, Surgical history, Family history not pertinant except as noted below, Social history, Allergies, and medications have been entered into the medical record, reviewed, and corrections made.   Review of Systems: No fevers, chills, night sweats, weight loss, chest pain, or shortness of breath.   Objective:    General: Well Developed, well nourished, and in no acute distress.  Neuro: Alert and oriented x3, extra-ocular muscles intact, sensation grossly intact.  HEENT: Normocephalic, atraumatic  Skin: Warm and dry, no rashes. Cardiac: Regular rate and rhythm, no murmurs rubs or gallops, no lower extremity edema.  Respiratory: Clear to auscultation bilaterally. Not using accessory muscles, speaking in full sentences.   Impression and Recommendations:    DM- Well controlled. Hemoglobin A1c of 5.4 today. He's been having a couple hypoglycemic events at night some decrease her glipizide down to 2.5  mg.  OSA - Will try to get a download from [facility] to see how often and how long she is actually using the CPAP. We were able to get a fax copy of the report. Unfortunately she's really only leaving it on for a few minutes. It is set on AutoPap some going to change that to 5 cm of water pressure and notify [facility] and have her continue to try to use it at night. We are also working on trying to get her overnight oxygen as well. Will see if paperwork was completed while I was out of town.  COPD - No recent flares or exacerbations. She does continue to smoke.  Lower extremity weakness as well as diabetic peripheral neuropathy-she needs a new prescription for a rolling walker with seat. Prescription provided today.  Corn-we'll refer to podiatry for further evaluation.  Lower extremity weakness/diabetic peripheral neuropathy-new perception given for rolling walker with seat.

## 2017-03-15 NOTE — Telephone Encounter (Signed)
Call pt: I did receive her down load. She is set on Autopap.  We will call [facility] and have them change pressure to 5 cm water pressure so pressue isn't going up an down.  She has only been wearing it for about 2 minutes on average. Please try to use it for at least an hour each night.  I know this is hard but please try once we get the pressue adjusted.   Please call [facility] qnad have them set the pressure to 5 cm water pressure and to instruct pto on hwo to decrease the humidity.

## 2017-03-15 NOTE — Telephone Encounter (Signed)
Call [facility]: Need to get a download of her CPAP recently. I think she is actually set on AutoPap. Also need to adjust her humidifier setting. Either need to turn it down or completely turned off.

## 2017-03-16 NOTE — Telephone Encounter (Signed)
Called [facility] and was told that the changes must be on prescription and then faxed to the. Before they can change it. Rhonda Cunningham,CMA

## 2017-03-19 DIAGNOSIS — R197 Diarrhea, unspecified: Secondary | ICD-10-CM | POA: Diagnosis not present

## 2017-03-19 DIAGNOSIS — R109 Unspecified abdominal pain: Secondary | ICD-10-CM | POA: Diagnosis not present

## 2017-03-19 DIAGNOSIS — K649 Unspecified hemorrhoids: Secondary | ICD-10-CM | POA: Diagnosis not present

## 2017-03-19 DIAGNOSIS — K219 Gastro-esophageal reflux disease without esophagitis: Secondary | ICD-10-CM | POA: Diagnosis not present

## 2017-03-19 MED ORDER — AMBULATORY NON FORMULARY MEDICATION: 0 refills | Status: DC

## 2017-03-19 NOTE — Telephone Encounter (Signed)
Rx updated, printed, and faxed.

## 2017-03-26 ENCOUNTER — Ambulatory Visit (INDEPENDENT_AMBULATORY_CARE_PROVIDER_SITE_OTHER): Payer: Medicare Other | Admitting: Podiatry

## 2017-03-26 ENCOUNTER — Encounter: Payer: Self-pay | Admitting: Podiatry

## 2017-03-26 ENCOUNTER — Other Ambulatory Visit: Payer: Self-pay | Admitting: Family Medicine

## 2017-03-26 VITALS — BP 138/68 | HR 46 | Ht 67.0 in | Wt 190.0 lb

## 2017-03-26 DIAGNOSIS — B351 Tinea unguium: Secondary | ICD-10-CM | POA: Diagnosis not present

## 2017-03-26 DIAGNOSIS — G608 Other hereditary and idiopathic neuropathies: Secondary | ICD-10-CM | POA: Diagnosis not present

## 2017-03-26 DIAGNOSIS — M79673 Pain in unspecified foot: Secondary | ICD-10-CM | POA: Diagnosis not present

## 2017-03-26 DIAGNOSIS — E1142 Type 2 diabetes mellitus with diabetic polyneuropathy: Secondary | ICD-10-CM | POA: Diagnosis not present

## 2017-03-26 NOTE — Progress Notes (Signed)
SUBJECTIVE: 71 y.o. year old female presents using walker complaining of painful corn and problematic toe nails.  Patient has multiple neurologic disorder and unable to handle her nails. Diabetic x over 40 years. This morning reading was 111.  Patient is referred by Dr. Madilyn Fireman.   REVIEW OF SYSTEMS: Pertinent items noted in HPI and remainder of comprehensive ROS otherwise negative.  OBJECTIVE: DERMATOLOGIC EXAMINATION: Nails: Thick dystrophic nails x 10. Digital corn at distal end 3rd digit left.   VASCULAR EXAMINATION OF LOWER LIMBS: Feet are cold and pedal pulses are not palpable.   NEUROLOGIC EXAMINATION OF THE LOWER LIMBS: Failed to respond to Monofilament (Semmes-Weinstein 10-gm) sensory testing, 6 out of 6, bilateral.  MUSCULOSKELETAL EXAMINATION: Mild digital contracture bilateral. All digits are flaccid. Hyperextended right great toe at MPJ.  ASSESSMENT: Peripheral neuropathy. Mycotic nails x 10. Digital contracture with digital corn 3rd left. Diabetic under control.  PLAN: Reviewed findings.  All nails debrided. Return in 3 months for routine foot care.

## 2017-03-26 NOTE — Patient Instructions (Signed)
Seen for hypertrophic nails and painful corn. All nails debrided. Painful lesion debrided. May use coban wrap to protect top of the toe with scab from rubbing.  Return in 3 months or sooner if needed.

## 2017-03-28 ENCOUNTER — Other Ambulatory Visit: Payer: Self-pay | Admitting: Family Medicine

## 2017-03-29 ENCOUNTER — Telehealth: Payer: Self-pay

## 2017-03-29 NOTE — Telephone Encounter (Signed)
Corey from [facility] called and stated that patient doesn't qualify for OSA O2.  She would need a titration graphic study with split night or stand alone.  Please advise.

## 2017-03-30 NOTE — Telephone Encounter (Signed)
Okay, I'm not exactly sure what that means. But she is really having hard time tolerating the CPAP anyway so I'm going to just go ahead and have them just to the 2 L of oxygen at night based on the original overnight pulse oximetry without the CPAP. Please call them to place new order.

## 2017-04-02 ENCOUNTER — Other Ambulatory Visit: Payer: Self-pay | Admitting: Family Medicine

## 2017-04-05 ENCOUNTER — Other Ambulatory Visit: Payer: Self-pay

## 2017-04-05 DIAGNOSIS — I701 Atherosclerosis of renal artery: Secondary | ICD-10-CM

## 2017-04-05 DIAGNOSIS — I723 Aneurysm of iliac artery: Secondary | ICD-10-CM

## 2017-04-12 NOTE — Telephone Encounter (Signed)
Faxed order

## 2017-04-13 ENCOUNTER — Other Ambulatory Visit: Payer: Self-pay | Admitting: Family Medicine

## 2017-04-16 ENCOUNTER — Encounter: Payer: Self-pay | Admitting: Family

## 2017-04-18 ENCOUNTER — Encounter: Payer: Self-pay | Admitting: Family

## 2017-04-18 ENCOUNTER — Ambulatory Visit (HOSPITAL_COMMUNITY)
Admission: RE | Admit: 2017-04-18 | Discharge: 2017-04-18 | Disposition: A | Discharge status: Home / Self Care | Payer: Medicare Other | Source: Ambulatory Visit | Attending: Family | Admitting: Family

## 2017-04-18 ENCOUNTER — Ambulatory Visit (INDEPENDENT_AMBULATORY_CARE_PROVIDER_SITE_OTHER)
Admission: RE | Admit: 2017-04-18 | Discharge: 2017-04-18 | Disposition: A | Discharge status: Home / Self Care | Payer: Medicare Other | Source: Ambulatory Visit | Attending: Family | Admitting: Family

## 2017-04-18 ENCOUNTER — Ambulatory Visit (INDEPENDENT_AMBULATORY_CARE_PROVIDER_SITE_OTHER): Payer: Medicare Other | Admitting: Family

## 2017-04-18 VITALS — BP 135/74 | HR 47 | Temp 98.0°F | Resp 16 | Ht 67.0 in | Wt 190.0 lb

## 2017-04-18 DIAGNOSIS — I701 Atherosclerosis of renal artery: Secondary | ICD-10-CM | POA: Insufficient documentation

## 2017-04-18 DIAGNOSIS — Z959 Presence of cardiac and vascular implant and graft, unspecified: Secondary | ICD-10-CM

## 2017-04-18 DIAGNOSIS — Z8679 Personal history of other diseases of the circulatory system: Secondary | ICD-10-CM

## 2017-04-18 DIAGNOSIS — F17200 Nicotine dependence, unspecified, uncomplicated: Secondary | ICD-10-CM

## 2017-04-18 DIAGNOSIS — I723 Aneurysm of iliac artery: Secondary | ICD-10-CM | POA: Diagnosis not present

## 2017-04-18 NOTE — Patient Instructions (Addendum)
Before your next abdominal ultrasound:  Take two Extra-Strength Gas-X capsules at bedtime the night before the test. Take another two Extra-Strength Gas-X capsules 3 hours before the test.  Avoid gas forming foods the day before the test.       Steps to Quit Smoking Smoking tobacco can be bad for your health. It can also affect almost every organ in your body. Smoking puts you and people around you at risk for many serious long-lasting (chronic) diseases. Quitting smoking is hard, but it is one of the best things that you can do for your health. It is never too late to quit. What are the benefits of quitting smoking? When you quit smoking, you lower your risk for getting serious diseases and conditions. They can include:  Lung cancer or lung disease.  Heart disease.  Stroke.  Heart attack.  Not being able to have children (infertility).  Weak bones (osteoporosis) and broken bones (fractures).  If you have coughing, wheezing, and shortness of breath, those symptoms may get better when you quit. You may also get sick less often. If you are pregnant, quitting smoking can help to lower your chances of having a baby of low birth weight. What can I do to help me quit smoking? Talk with your doctor about what can help you quit smoking. Some things you can do (strategies) include:  Quitting smoking totally, instead of slowly cutting back how much you smoke over a period of time.  Going to in-person counseling. You are more likely to quit if you go to many counseling sessions.  Using resources and support systems, such as: ? Database administrator with a Social worker. ? Phone quitlines. ? Careers information officer. ? Support groups or group counseling. ? Text messaging programs. ? Mobile phone apps or applications.  Taking medicines. Some of these medicines may have nicotine in them. If you are pregnant or breastfeeding, do not take any medicines to quit smoking unless your doctor says it is  okay. Talk with your doctor about counseling or other things that can help you.  Talk with your doctor about using more than one strategy at the same time, such as taking medicines while you are also going to in-person counseling. This can help make quitting easier. What things can I do to make it easier to quit? Quitting smoking might feel very hard at first, but there is a lot that you can do to make it easier. Take these steps:  Talk to your family and friends. Ask them to support and encourage you.  Call phone quitlines, reach out to support groups, or work with a Social worker.  Ask people who smoke to not smoke around you.  Avoid places that make you want (trigger) to smoke, such as: ? Bars. ? Parties. ? Smoke-break areas at work.  Spend time with people who do not smoke.  Lower the stress in your life. Stress can make you want to smoke. Try these things to help your stress: ? Getting regular exercise. ? Deep-breathing exercises. ? Yoga. ? Meditating. ? Doing a body scan. To do this, close your eyes, focus on one area of your body at a time from head to toe, and notice which parts of your body are tense. Try to relax the muscles in those areas.  Download or buy apps on your mobile phone or tablet that can help you stick to your quit plan. There are many free apps, such as QuitGuide from the [employer] Office manager for Disease Control and  Prevention). You can find more support from smokefree.gov and other websites.  This information is not intended to replace advice given to you by your health care provider. Make sure you discuss any questions you have with your health care provider. Document Released: 05/20/2009 Document Revised: 03/21/2016 Document Reviewed: 12/08/2014 Elsevier Interactive Patient Education  2018 [employer].    Retroperitoneal Bleeding Retroperitoneal bleeding happens when the blood vessels behind your abdomen bleed into the space between your abdomen and your back  (retroperitoneal space). This space contains:  Your kidneys.  The glands that are on top of your kidneys (adrenal glands).  The tubes that drain urine from your kidneys (ureters).  The large blood vessel that carries blood to your lower body (aorta).  Some parts of your digestive tract.  Bleeding may come from any organ in the retroperitoneal space. This is a rare but life-threatening condition. What are the causes? This condition may be caused by:  Surgery in the retroperitoneal space.  Injury (trauma) to your pelvic area, abdomen, or back.  When retroperitoneal bleeding is not caused by trauma, it is called spontaneous retroperitoneal bleeding. Spontaneous retroperitoneal bleeding may be caused by:  Tumors of the kidneys or adrenal glands.  Use of medicines that thin your blood (anticoagulants).  Diseases that cause your body to be unable to form blood clots (clotting disorders).  Swelling in a blood vessel from a weakened artery wall (aneurysm) that can rupture and cause bleeding.  Sometimes, the cause is not known (idiopathic). What increases the risk? This condition is more likely to develop in people:  Who are on dialysis.  Who take anticoagulant medicine.  Who have high blood pressure.  Who have hardening of the arteries (atherosclerosis).  What are the signs or symptoms? Signs and symptoms may appear suddenly if the bleeding starts suddenly (acute), or they may appear gradually if the bleeding occurs slowly over time (chronic). Symptoms of this condition include:  Pain in the abdomen or side (flank).  Pain when pressing on the abdomen or flank.  Dizziness or light-headedness from low blood pressure.  Rapid heartbeat (tachycardia).  Blood in the urine.  Very low blood pressure, which can cause shock. Symptoms of shock include: ? Fainting. ? Trouble with breathing. ? Cold, clammy skin.  How is this diagnosed? This condition may be diagnosed based on  your symptoms and your medical history. It is important to find the cause of the bleeding. Your health care provider will do a physical exam. You may also have tests, including:  Abdominal X-rays to check for signs of bleeding or aneurysm.  Blood tests to check for low blood counts (anemia) or blood loss.  Imaging studies, such as: ? CT scan. You may have dye injected into a blood vessel to help locate the source of the bleeding (CT angiography). ? Ultrasound. ? MRI.  How is this treated? The goal of treatment is to remove the blood and stop the bleeding. Treatment may include:  Giving you fluids through an IV tube to get your blood pressure back into a normal range.  Giving you blood from a donor (transfusion).  Placing a long tube (catheter) through your blood vessel to the site of bleeding and blocking the bleeding with a small plug (embolization).  Exploratory surgery to find the bleeding and stop it. This may be done using an operating scope (laparoscopy) or as an open surgery (laparotomy).  This information is not intended to replace advice given to you by your health care  provider. Make sure you discuss any questions you have with your health care provider. Document Released: 10/13/2004 Document Revised: 12/30/2015 Document Reviewed: 04/29/2014 Elsevier Interactive Patient Education  [employer].

## 2017-04-18 NOTE — Progress Notes (Signed)
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 ([birth year]) female patient of Dr. 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 Pt underwent L RAS 11/28/10. Previous renal duplex completed on 03/31/11 reveals: R RA stenosis: <60% and L RA stenosis: <60%.  The patient's urinary history has remained stable. Additionally, the patient has known R CIA aneurysm. She notes no symptoms from this R CIA aneurysm.  She denies abdominal pain. She has DDD, severe arthritis, stenosis of her spine, and has chronic pain from this; she is under tx by pain management.  She has no new back pain.   Other medical problems include IBS with loose stools. She denies cardiac problems.  She states the humidity of her C-PAP is causing a cough, she denies any more dyspnea than usual.   Pt denies any hx of stroke or TIA. Pt states her barrier to walking much is numbness in her feet and lower legs, and the heavy feeling in her legs; she also has joint pain in her legs. She states infusions of gamma globulin help her to walk better as it improves these sx's.   Pt's medications include daily ASA, Plavix, beta blocker, statin. She has DM, takes insulin, A1C January 2016 was 6.1, on 03-15-17 was 5.4. Pt is a current smoker: smokes 1/2 ppd; she was working with her PCP on smoking cessation, is trying on her own now.  The patient's blood pressure has been stable.  The patient's blood pressure medication regimen has remained stable.     Past Medical History:  Diagnosis Date  . Arthritis   . Charcot-Marie-Tooth disease   . COPD (chronic obstructive pulmonary disease) ([place])   . DDD (degenerative disc disease)    Lumbar and lumbosacral  . Depression   . Diabetes mellitus    type 2  . Diabetic peripheral neuropathy ([place])   . Facet syndrome, lumbar ([place])   . Gastric ulcer    w/o hemorrhage  . Hyperlipidemia   . Hypertension   . Insomnia   . Lumbosacral root lesions, not elsewhere classified   . Neurogenic bladder   . Obesity   . Osteopenia   . Other symptoms referable to back   . Post-menopausal   . PUD (peptic ulcer disease)   . 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 Spinal stenosis, lumbar region, without neurogenic claudication   . Thoracic spondylosis without myelopathy   . Thyroid disease    hypo  . Tobacco dependence   . Unspecified hereditary and idiopathic peripheral neuropathy     Social History Social History  Substance Use Topics  . Smoking status: Current Every Day Smoker    Packs/day: 0.75    Years: 49.00    Types: Cigarettes  . Smokeless tobacco: Never Used     Comment: pt states she is going to try the E-cig  . Alcohol use No    Family History Family History  Problem Relation Age of Onset  . Stroke Mother   . Heart disease Father 75       heart attack  . Hypertension Father   . Cancer Father        lung  . Diabetes Paternal Aunt        insulin dependent    Surgical History Past Surgical History:  Procedure Laterality Date  .  ANKLE RECONSTRUCTION     left ankle, plate and pins   . bilateral L3 medial branch block    . CHOLECYSTECTOMY    . fibroid tumor removal    . fluoroscopic L5 dorsal medial branch bloc    . RENAL ARTERY STENT  11/28/2010   left  . REPLACEMENT TOTAL KNEE      Allergies  Allergen Reactions  . Varenicline Nausea And Vomiting    Current Outpatient Prescriptions  Medication Sig Dispense Refill  . alendronate (FOSAMAX) 70 MG tablet TAKE 1 TAB EVERY 7 DAYS. TAKE IN THE MORNING WITH A FULL GLASS OF WATER ON AN EMPTY STOMACH. DO NOT INGEST ANY OTHER FOOD OR DRINK OR LIE D 12 tablet 4  . AMBULATORY NON FORMULARY MEDICATION Medication Name: order overnight pulse ox.  Dx COPD with SOB at night. 1 vial 0  . AMBULATORY NON FORMULARY MEDICATION Medication Name: rolling walker with  Seat. Dx: Lower extremity weaknesss.  Diabetic Neuropathy (E11.42) 1 Units 0  . AMBULATORY NON FORMULARY MEDICATION Medication Name: Auto Pap set to 5cm water pressure with 2 liter nocturnal oxygen as she desats as well. AHI 21.  After one week fax download form machine so we can bettter set her CPAP.  Fax to [facility]. 1 vial 0  . aspirin EC 81 MG tablet Take 81 mg by mouth daily.    . Azelastine HCl 0.15 % SOLN Place 2 sprays into the nose 2 (two) times daily. 30 mL 4  . blood glucose meter kit and supplies KIT Dispense based on patient and insurance preference. Use up to four times daily as directed Dx E11.40 1 each 0  . clopidogrel (PLAVIX) 75 MG tablet TAKE 1 TABLET BY MOUTH EVERY DAY 30 tablet 11  . dicyclomine (BENTYL) 20 MG tablet Take 1 tablet (20 mg total) by mouth 3 (three) times daily as needed for spasms. 90 tablet 1  . diphenoxylate-atropine (LOMOTIL) 2.5-0.025 MG tablet Take 1-2 tablets by mouth 2 (two) times daily as needed for diarrhea or loose stools. 60 tablet 0  . FLUoxetine (PROZAC) 40 MG capsule Take 2 capsules (80 mg total) by mouth daily. 180 capsule 3  . furosemide (LASIX) 40 MG tablet TAKE 1 OR 2 TABLETS EVERY MORNING. 90 tablet 1  . gabapentin (NEURONTIN) 400 MG capsule TAKE ONE CAPSULE THREE TIMES DAILY AS NEEDED (Patient taking differently: 4 (four) times daily. TAKE ONE CAPSULE THREE TIMES DAILY AS NEEDED) 270 capsule 1  . glipiZIDE (GLUCOTROL) 5 MG tablet Take 1/2 tablet (2.5 mg total) by mouth daily before breakfast. 45 tablet 3  . glucose blood test strip 1 each as needed. Use as instructed     . HYDROcodone-acetaminophen (NORCO) 10-325 MG per tablet Take 1 tablet by mouth every 6 (six) hours as needed. Do not take more that 5 tabs a day    . Lancets MISC by Does not apply route.      Marland Kitchen LANTUS SOLOSTAR 100 UNIT/ML Solostar Pen inject 48 UNITS daily 15 mL 6  . levothyroxine (SYNTHROID, LEVOTHROID) 100 MCG tablet Take 1 tablet (100 mcg total) by mouth daily before  breakfast. 90 tablet 0  . lidocaine (XYLOCAINE) 5 % ointment Apply 1 application topically daily. 2 hours before skin procedure. 50 g 11  . metFORMIN (GLUCOPHAGE) 500 MG tablet Take 1 tablet (500 mg total) by mouth 3 (three) times daily. 270 tablet 0  . metoprolol tartrate (LOPRESSOR) 100 MG tablet TAKE ONE TABLET TWICE DAILY 60 tablet 2  .  nitroGLYCERIN (NITROSTAT) 0.4 MG SL tablet Place 1 tablet (0.4 mg total) under the tongue every 5 (five) minutes as needed. 10 tablet 1  . simvastatin (ZOCOR) 40 MG tablet Take 1 tablet (40 mg total) by mouth daily at 6 PM. 30 tablet 4  . SPIRIVA HANDIHALER 18 MCG inhalation capsule Place 1 capsule (18 mcg total) into inhaler and inhale daily. 90 capsule 0  . VENTOLIN HFA 108 (90 BASE) MCG/ACT inhaler Inhale 2 puffs into the lungs every 6 (six) hours as needed for wheezi ng. 18 each 3  . zolpidem (AMBIEN) 5 MG tablet TAKE 1 TABLET DAILY AT BEDTIME 30 tablet 0   No current facility-administered medications for this visit.     REVIEW OF SYSTEMS: see HPI for pertinent positives and negatives    Physical Examination  Vitals:   04/18/17 1105  BP: 135/74  Pulse: (!) 47  Resp: 16  Temp: 98 F (36.7 C)  SpO2: 94%  Weight: 190 lb (86.2 kg)  Height: '5\' 7"'$  (1.702 m)   Body mass index is 29.76 kg/m.   General: The patient appears their stated age 69: No gross abnormalities Pulmonary: Respirations are non-labored, BBS CTA. Limited air movement in right posterior fields, no rales, rhonchi, or wheezes.  Abdomen: Soft and non-tender with normal pitched bowel sounds.  Musculoskeletal: There are no major deformities.  Neurologic: Muscle strength 4/5 in upper extremities, 2-3/5 in lower extremities. CN 2-12 intact.  Skin: There are no ulcer or rashes. Psychiatric: The patient has normal affect. Cardiovascular: There is a regular rate and rhythm without significant murmur appreciated.   Vascular: Vessel Right Left  Radial 1+Palpable  1+Palpable  Carotid Audible without bruit audible without bruit  Aorta Not palpable N/A  Femoral 2+Palpable 3+Palpable  Popliteal Not palpable Not palpable  PT notPalpable notPalpable  DP 2+Palpable Not Palpable    DATA  Renal Artery Duplex(04-18-17): Stent walls on the left difficult to distinguish. The left stent appears patent, not thoroughly visualized.  Less than 60% stenosis in the renal arteries bilaterally.  No significant change since exam on 09-17-15.    Aortoiliac Duplex (04-18-17): Bilateral common iliac arteries with biphasic waveforms. Patent aortoiliac arteries with no visualized stenosis. The right CIA measures 2.14 cm x 2.3 cm (compared to 2.1 cm x 2.1 cm on 09-17-15) . The left CIA appears to have two dilatations, the largest measures 1.68 cm x 1.6 cm.  Slight increase in size of the right CIA aneurysm, dilatation now noted on the left.    Medical Decision Making  Iyari Hagner is a 71 y.o. 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 Pt underwent L RAS 11/28/10.  Her blood pressure remains in good control, she has no abdominal pain. I spoke with Dr. Donzetta Matters re whether to continue to evaluate the renal artery stent if her blood pressure remains in control; will monitor yearly.   Her atherosclerotic risk factors include active smoking and controlled DM. The patient was counseled re smoking cessation and given several free resources re smoking cessation. She takes a daily ASA, Plavix, and a statin.   Based on the patient's vascular studies and examination, I have offered the patient: bilateral Aortoiliac duplex and bilateral renal artery duplex in 12 months  I discussed in depth with the patient the nature of atherosclerosis, and emphasized the importance of maximal medical management including strict control of blood pressure, blood glucose, and lipid levels, antiplatelet agents, obtaining regular exercise, and  cessation of smoking.  The patient is aware that without maximal medical management the underlying atherosclerotic disease process will progress, limiting the benefit of any interventions.  Thank you for allowing [place] to participate in this patient's care.  NICKEL, Sharmon Leyden, RN, MSN, FNP-C [facility] Office: [phone]  04/18/2017, 11:08 AM  Clinic MD:  Donzetta Matters

## 2017-04-20 ENCOUNTER — Other Ambulatory Visit: Payer: Self-pay | Admitting: Family Medicine

## 2017-04-20 DIAGNOSIS — F321 Major depressive disorder, single episode, moderate: Secondary | ICD-10-CM | POA: Diagnosis not present

## 2017-04-20 DIAGNOSIS — M5416 Radiculopathy, lumbar region: Secondary | ICD-10-CM | POA: Diagnosis not present

## 2017-04-20 DIAGNOSIS — G6181 Chronic inflammatory demyelinating polyneuritis: Secondary | ICD-10-CM | POA: Diagnosis not present

## 2017-05-02 ENCOUNTER — Telehealth: Payer: Self-pay

## 2017-05-02 NOTE — Telephone Encounter (Signed)
Please call [name] to schedule an appointment for walk O2 test. We were unable to qualify her for O2 at night.

## 2017-05-03 ENCOUNTER — Other Ambulatory Visit: Payer: Self-pay | Admitting: Family Medicine

## 2017-05-03 NOTE — Addendum Note (Signed)
Addended by: Lianne Cure A on: 05/03/2017 04:47 PM   Modules accepted: Orders

## 2017-05-09 ENCOUNTER — Ambulatory Visit: Payer: Medicare Other

## 2017-05-11 ENCOUNTER — Other Ambulatory Visit: Payer: Self-pay | Admitting: Family Medicine

## 2017-05-12 ENCOUNTER — Other Ambulatory Visit: Payer: Self-pay | Admitting: Family Medicine

## 2017-05-21 ENCOUNTER — Other Ambulatory Visit: Payer: Self-pay | Admitting: Family Medicine

## 2017-05-24 DIAGNOSIS — Z79891 Long term (current) use of opiate analgesic: Secondary | ICD-10-CM | POA: Diagnosis not present

## 2017-05-24 DIAGNOSIS — M47816 Spondylosis without myelopathy or radiculopathy, lumbar region: Secondary | ICD-10-CM | POA: Diagnosis not present

## 2017-05-24 DIAGNOSIS — G894 Chronic pain syndrome: Secondary | ICD-10-CM | POA: Diagnosis not present

## 2017-05-24 DIAGNOSIS — M5136 Other intervertebral disc degeneration, lumbar region: Secondary | ICD-10-CM | POA: Diagnosis not present

## 2017-06-05 ENCOUNTER — Other Ambulatory Visit: Payer: Self-pay | Admitting: Family Medicine

## 2017-06-11 ENCOUNTER — Ambulatory Visit: Payer: Medicare Other | Admitting: Podiatry

## 2017-06-18 ENCOUNTER — Encounter: Payer: Self-pay | Admitting: Podiatry

## 2017-06-18 ENCOUNTER — Ambulatory Visit (INDEPENDENT_AMBULATORY_CARE_PROVIDER_SITE_OTHER): Payer: Medicare Other | Admitting: Podiatry

## 2017-06-18 DIAGNOSIS — B351 Tinea unguium: Secondary | ICD-10-CM | POA: Diagnosis not present

## 2017-06-18 DIAGNOSIS — E1142 Type 2 diabetes mellitus with diabetic polyneuropathy: Secondary | ICD-10-CM | POA: Diagnosis not present

## 2017-06-18 NOTE — Patient Instructions (Signed)
Seen for hypertrophic nails. All nails debrided. Return in 3 months or as needed.  

## 2017-06-18 NOTE — Progress Notes (Signed)
Subjective: 71 y.o. year old female patient presents complaining of painful nails. Patient requests toe nails trimmed. Patient uses walker due to multiple neurologic disorder. Diabetic x 40 years. This morning blood sugar was 108.  Objective: Dermatologic: Thick yellow deformed nails with fungal debris x 10.  Ingrown hallucal nail left great toe nail. Vascular: Pedal pulses are not palpable. Orthopedic: Contracted lesser digits bilateral, hyperextended great toe at the first MPJ right. Neurologic: Failed to respond to monofilament testing bilateral.  Assessment: Dystrophic mycotic nails x 10. Peripheral neuropathy. Diabetic under control.  Treatment: All mycotic nails debrided.  Return in 3 months or as needed.

## 2017-07-12 DIAGNOSIS — H25813 Combined forms of age-related cataract, bilateral: Secondary | ICD-10-CM | POA: Diagnosis not present

## 2017-07-12 DIAGNOSIS — H353211 Exudative age-related macular degeneration, right eye, with active choroidal neovascularization: Secondary | ICD-10-CM | POA: Diagnosis not present

## 2017-07-12 DIAGNOSIS — H527 Unspecified disorder of refraction: Secondary | ICD-10-CM | POA: Diagnosis not present

## 2017-07-12 DIAGNOSIS — E119 Type 2 diabetes mellitus without complications: Secondary | ICD-10-CM | POA: Diagnosis not present

## 2017-07-12 DIAGNOSIS — H353122 Nonexudative age-related macular degeneration, left eye, intermediate dry stage: Secondary | ICD-10-CM | POA: Diagnosis not present

## 2017-07-12 LAB — HM DIABETES EYE EXAM

## 2017-07-16 ENCOUNTER — Ambulatory Visit: Payer: Medicare Other | Admitting: Family Medicine

## 2017-07-20 ENCOUNTER — Other Ambulatory Visit: Payer: Self-pay | Admitting: Family Medicine

## 2017-07-26 NOTE — Telephone Encounter (Signed)
Patient has been scheduled

## 2017-07-27 ENCOUNTER — Other Ambulatory Visit: Payer: Self-pay | Admitting: Family Medicine

## 2017-07-30 ENCOUNTER — Other Ambulatory Visit: Payer: Self-pay | Admitting: Family Medicine

## 2017-08-04 ENCOUNTER — Other Ambulatory Visit: Payer: Self-pay | Admitting: Family Medicine

## 2017-08-16 DIAGNOSIS — G894 Chronic pain syndrome: Secondary | ICD-10-CM | POA: Diagnosis not present

## 2017-08-16 DIAGNOSIS — M5136 Other intervertebral disc degeneration, lumbar region: Secondary | ICD-10-CM | POA: Diagnosis not present

## 2017-08-16 DIAGNOSIS — Z79891 Long term (current) use of opiate analgesic: Secondary | ICD-10-CM | POA: Diagnosis not present

## 2017-08-16 DIAGNOSIS — M47816 Spondylosis without myelopathy or radiculopathy, lumbar region: Secondary | ICD-10-CM | POA: Diagnosis not present

## 2017-08-17 ENCOUNTER — Encounter: Payer: Self-pay | Admitting: Family Medicine

## 2017-08-21 DIAGNOSIS — H25813 Combined forms of age-related cataract, bilateral: Secondary | ICD-10-CM | POA: Diagnosis not present

## 2017-08-22 ENCOUNTER — Ambulatory Visit: Payer: Medicare Other | Admitting: Family Medicine

## 2017-08-27 DIAGNOSIS — H353211 Exudative age-related macular degeneration, right eye, with active choroidal neovascularization: Secondary | ICD-10-CM | POA: Diagnosis not present

## 2017-08-27 DIAGNOSIS — H353123 Nonexudative age-related macular degeneration, left eye, advanced atrophic without subfoveal involvement: Secondary | ICD-10-CM | POA: Diagnosis not present

## 2017-09-01 ENCOUNTER — Other Ambulatory Visit: Payer: Self-pay | Admitting: Family Medicine

## 2017-09-06 DIAGNOSIS — H353211 Exudative age-related macular degeneration, right eye, with active choroidal neovascularization: Secondary | ICD-10-CM | POA: Diagnosis not present

## 2017-09-06 DIAGNOSIS — E785 Hyperlipidemia, unspecified: Secondary | ICD-10-CM | POA: Diagnosis not present

## 2017-09-06 DIAGNOSIS — H25813 Combined forms of age-related cataract, bilateral: Secondary | ICD-10-CM | POA: Diagnosis not present

## 2017-09-06 DIAGNOSIS — Z7902 Long term (current) use of antithrombotics/antiplatelets: Secondary | ICD-10-CM | POA: Diagnosis not present

## 2017-09-06 DIAGNOSIS — Z95820 Peripheral vascular angioplasty status with implants and grafts: Secondary | ICD-10-CM | POA: Diagnosis not present

## 2017-09-06 DIAGNOSIS — I10 Essential (primary) hypertension: Secondary | ICD-10-CM | POA: Diagnosis not present

## 2017-09-06 DIAGNOSIS — Z96659 Presence of unspecified artificial knee joint: Secondary | ICD-10-CM | POA: Diagnosis not present

## 2017-09-06 DIAGNOSIS — G4730 Sleep apnea, unspecified: Secondary | ICD-10-CM | POA: Diagnosis not present

## 2017-09-06 DIAGNOSIS — H25812 Combined forms of age-related cataract, left eye: Secondary | ICD-10-CM | POA: Diagnosis not present

## 2017-09-06 DIAGNOSIS — H353120 Nonexudative age-related macular degeneration, left eye, stage unspecified: Secondary | ICD-10-CM | POA: Diagnosis not present

## 2017-09-06 DIAGNOSIS — F17210 Nicotine dependence, cigarettes, uncomplicated: Secondary | ICD-10-CM | POA: Diagnosis not present

## 2017-09-06 DIAGNOSIS — E1142 Type 2 diabetes mellitus with diabetic polyneuropathy: Secondary | ICD-10-CM | POA: Diagnosis not present

## 2017-09-06 DIAGNOSIS — E039 Hypothyroidism, unspecified: Secondary | ICD-10-CM | POA: Diagnosis not present

## 2017-09-07 ENCOUNTER — Other Ambulatory Visit: Payer: Self-pay | Admitting: Family Medicine

## 2017-09-07 DIAGNOSIS — E119 Type 2 diabetes mellitus without complications: Secondary | ICD-10-CM | POA: Diagnosis not present

## 2017-09-07 DIAGNOSIS — H353211 Exudative age-related macular degeneration, right eye, with active choroidal neovascularization: Secondary | ICD-10-CM | POA: Diagnosis not present

## 2017-09-07 DIAGNOSIS — Z961 Presence of intraocular lens: Secondary | ICD-10-CM | POA: Diagnosis not present

## 2017-09-07 DIAGNOSIS — H25811 Combined forms of age-related cataract, right eye: Secondary | ICD-10-CM | POA: Diagnosis not present

## 2017-09-07 DIAGNOSIS — H527 Unspecified disorder of refraction: Secondary | ICD-10-CM | POA: Diagnosis not present

## 2017-09-07 DIAGNOSIS — H353122 Nonexudative age-related macular degeneration, left eye, intermediate dry stage: Secondary | ICD-10-CM | POA: Diagnosis not present

## 2017-09-07 LAB — HM DIABETES EYE EXAM

## 2017-09-07 NOTE — Telephone Encounter (Signed)
Fwd to pcp for signature.[name], [name]

## 2017-09-10 ENCOUNTER — Encounter: Payer: Self-pay | Admitting: Family Medicine

## 2017-09-15 ENCOUNTER — Other Ambulatory Visit: Payer: Self-pay | Admitting: Family Medicine

## 2017-09-17 ENCOUNTER — Ambulatory Visit: Payer: Medicare Other | Admitting: Podiatry

## 2017-09-20 ENCOUNTER — Ambulatory Visit: Payer: Medicare Other | Admitting: Family Medicine

## 2017-09-22 ENCOUNTER — Other Ambulatory Visit: Payer: Self-pay | Admitting: Family Medicine

## 2017-09-24 ENCOUNTER — Ambulatory Visit (INDEPENDENT_AMBULATORY_CARE_PROVIDER_SITE_OTHER): Payer: Medicare Other | Admitting: Podiatry

## 2017-09-24 DIAGNOSIS — E1142 Type 2 diabetes mellitus with diabetic polyneuropathy: Secondary | ICD-10-CM | POA: Diagnosis not present

## 2017-09-24 DIAGNOSIS — B351 Tinea unguium: Secondary | ICD-10-CM | POA: Diagnosis not present

## 2017-09-24 NOTE — Progress Notes (Signed)
Subjective: 72 y.o. year old female patient presents for diabetic foot care. Complaining of her feet staying cold. Has neuropathy and walks in steppage gait and uses walker. Blood sugar this morning was 114.   Objective: Dermatologic: Thick yellow deformed nails x 10. Ingrown hallucal nail left great toe without open skin. Vascular: Pedal pulses are all palpable. Orthopedic: Contracted lesser digits with hyperextended great toe right. Neurologic: Loss of protective sensory perception bilateral.  Assessment: Dystrophic mycotic nails x 10. Ingrown hallucal nails. Peripheral neuropathy. Diabetic under control.  Treatment: All mycotic nails and ingrown nails debrided.  Return in 3 months or as needed.

## 2017-09-24 NOTE — Patient Instructions (Signed)
Seen for hypertrophic nails. All nails debrided. Return in 3 months or as needed.  

## 2017-09-25 ENCOUNTER — Encounter: Payer: Self-pay | Admitting: Podiatry

## 2017-09-27 DIAGNOSIS — H2513 Age-related nuclear cataract, bilateral: Secondary | ICD-10-CM | POA: Diagnosis not present

## 2017-09-27 DIAGNOSIS — H353211 Exudative age-related macular degeneration, right eye, with active choroidal neovascularization: Secondary | ICD-10-CM | POA: Diagnosis not present

## 2017-09-27 DIAGNOSIS — H31113 Age-related choroidal atrophy, bilateral: Secondary | ICD-10-CM | POA: Diagnosis not present

## 2017-09-27 DIAGNOSIS — H353123 Nonexudative age-related macular degeneration, left eye, advanced atrophic without subfoveal involvement: Secondary | ICD-10-CM | POA: Diagnosis not present

## 2017-10-01 ENCOUNTER — Other Ambulatory Visit: Payer: Self-pay | Admitting: Family Medicine

## 2017-10-02 ENCOUNTER — Ambulatory Visit: Payer: Medicare Other | Admitting: Family Medicine

## 2017-10-16 DIAGNOSIS — G6181 Chronic inflammatory demyelinating polyneuritis: Secondary | ICD-10-CM | POA: Diagnosis not present

## 2017-10-16 DIAGNOSIS — F321 Major depressive disorder, single episode, moderate: Secondary | ICD-10-CM | POA: Diagnosis not present

## 2017-10-16 DIAGNOSIS — R270 Ataxia, unspecified: Secondary | ICD-10-CM | POA: Diagnosis not present

## 2017-10-22 ENCOUNTER — Other Ambulatory Visit: Payer: Self-pay | Admitting: Family Medicine

## 2017-10-25 DIAGNOSIS — H353123 Nonexudative age-related macular degeneration, left eye, advanced atrophic without subfoveal involvement: Secondary | ICD-10-CM | POA: Diagnosis not present

## 2017-10-25 DIAGNOSIS — H353211 Exudative age-related macular degeneration, right eye, with active choroidal neovascularization: Secondary | ICD-10-CM | POA: Diagnosis not present

## 2017-10-25 DIAGNOSIS — H2513 Age-related nuclear cataract, bilateral: Secondary | ICD-10-CM | POA: Diagnosis not present

## 2017-10-25 DIAGNOSIS — H31113 Age-related choroidal atrophy, bilateral: Secondary | ICD-10-CM | POA: Diagnosis not present

## 2017-10-29 DIAGNOSIS — M5136 Other intervertebral disc degeneration, lumbar region: Secondary | ICD-10-CM | POA: Diagnosis not present

## 2017-10-29 DIAGNOSIS — M791 Myalgia, unspecified site: Secondary | ICD-10-CM | POA: Diagnosis not present

## 2017-10-29 DIAGNOSIS — M25511 Pain in right shoulder: Secondary | ICD-10-CM | POA: Diagnosis not present

## 2017-10-29 DIAGNOSIS — M1990 Unspecified osteoarthritis, unspecified site: Secondary | ICD-10-CM | POA: Diagnosis not present

## 2017-10-29 DIAGNOSIS — M47816 Spondylosis without myelopathy or radiculopathy, lumbar region: Secondary | ICD-10-CM | POA: Diagnosis not present

## 2017-10-29 DIAGNOSIS — G894 Chronic pain syndrome: Secondary | ICD-10-CM | POA: Diagnosis not present

## 2017-10-29 DIAGNOSIS — Z79891 Long term (current) use of opiate analgesic: Secondary | ICD-10-CM | POA: Diagnosis not present

## 2017-11-05 ENCOUNTER — Other Ambulatory Visit: Payer: Self-pay | Admitting: Family Medicine

## 2017-11-13 ENCOUNTER — Encounter: Payer: Self-pay | Admitting: Family Medicine

## 2017-11-13 ENCOUNTER — Telehealth: Payer: Self-pay | Admitting: Family Medicine

## 2017-11-13 ENCOUNTER — Ambulatory Visit (INDEPENDENT_AMBULATORY_CARE_PROVIDER_SITE_OTHER): Payer: Medicare Other | Admitting: Family Medicine

## 2017-11-13 VITALS — BP 121/71 | HR 52 | Ht 67.0 in | Wt 184.0 lb

## 2017-11-13 DIAGNOSIS — M810 Age-related osteoporosis without current pathological fracture: Secondary | ICD-10-CM

## 2017-11-13 DIAGNOSIS — E1140 Type 2 diabetes mellitus with diabetic neuropathy, unspecified: Secondary | ICD-10-CM | POA: Diagnosis not present

## 2017-11-13 DIAGNOSIS — G6181 Chronic inflammatory demyelinating polyneuritis: Secondary | ICD-10-CM

## 2017-11-13 DIAGNOSIS — K591 Functional diarrhea: Secondary | ICD-10-CM | POA: Diagnosis not present

## 2017-11-13 DIAGNOSIS — N1832 Chronic kidney disease, stage 3b: Secondary | ICD-10-CM

## 2017-11-13 DIAGNOSIS — E039 Hypothyroidism, unspecified: Secondary | ICD-10-CM | POA: Diagnosis not present

## 2017-11-13 DIAGNOSIS — M549 Dorsalgia, unspecified: Secondary | ICD-10-CM | POA: Diagnosis not present

## 2017-11-13 DIAGNOSIS — G4700 Insomnia, unspecified: Secondary | ICD-10-CM | POA: Diagnosis not present

## 2017-11-13 DIAGNOSIS — I10 Essential (primary) hypertension: Secondary | ICD-10-CM

## 2017-11-13 DIAGNOSIS — J449 Chronic obstructive pulmonary disease, unspecified: Secondary | ICD-10-CM | POA: Diagnosis not present

## 2017-11-13 DIAGNOSIS — N183 Chronic kidney disease, stage 3 (moderate): Secondary | ICD-10-CM | POA: Diagnosis not present

## 2017-11-13 LAB — POCT GLYCOSYLATED HEMOGLOBIN (HGB A1C): Hemoglobin A1C: 5.3

## 2017-11-13 MED ORDER — DICYCLOMINE HCL 20 MG PO TABS: ORAL_TABLET | ORAL | 1 refills | Status: DC

## 2017-11-13 MED ORDER — ZOLPIDEM TARTRATE 5 MG PO TABS: 5.0000 mg | ORAL_TABLET | Freq: Every day | ORAL | 0 refills | Status: DC

## 2017-11-13 MED ORDER — LEVALBUTEROL HCL 0.63 MG/3ML IN NEBU: 0.6300 mg | INHALATION_SOLUTION | RESPIRATORY_TRACT | 12 refills | Status: DC | PRN

## 2017-11-13 MED ORDER — AMBULATORY NON FORMULARY MEDICATION: 0 refills | Status: DC

## 2017-11-13 MED ORDER — DIPHENOXYLATE-ATROPINE 2.5-0.025 MG PO TABS: 1.0000 | ORAL_TABLET | Freq: Two times a day (BID) | ORAL | 1 refills | Status: DC | PRN

## 2017-11-13 MED ORDER — INSULIN GLARGINE 100 UNIT/ML SOLOSTAR PEN: 15.0000 [IU] | PEN_INJECTOR | Freq: Every day | SUBCUTANEOUS | 1 refills | Status: DC

## 2017-11-13 NOTE — Progress Notes (Signed)
Subjective:    CC:   HPI:  Diabetes - no hypoglycemic events. No wounds or sores that are not healing well. No increased thirst or urination. Checking glucose at home. Taking medications as prescribed without any side effects.  He is up to 15 units a day on her Lantus. Home glucose running 110-124. No majore lows. She tried decreasing her lantus to 10 units and sugars went up.    F/U insomnia - she is requesting a refill on her ambien. Tried using Tylenol PM but didn't help.    F/U COPD -she is not use her albuterol because she says it makes her heart pound.  He is using her Spiriva daily.  She has been getting more pain in her back over the bra-strap area that radiates bilaterally x 2 weeks. No recent injury.  Has hx of chronic low back pain.  . Worse when takes a deep breath. Using heat and hydrocodone. Hydocodone not helping.    Hypothyroid - overdue for labs.  Taking medication regularly. To recent changes to weight.    CIPD - following with Neurology and recdently had course of IVIg with no significant improvement in sxs.  Considering Rituxin.    Lab Results  Component Value Date   TSH 44.95 (H) 03/15/2016      BP 121/71   Pulse (!) 52   Ht '5\' 7"'$  (1.702 m)   Wt 184 lb (83.5 kg)   SpO2 94%   BMI 28.82 kg/m     Allergies  Allergen Reactions  . Albuterol Sulfate Palpitations  . Varenicline Nausea And Vomiting    Past Medical History:  Diagnosis Date  . Arthritis   . Charcot-Marie-Tooth disease   . COPD (chronic obstructive pulmonary disease) ([place])   . DDD (degenerative disc disease)    Lumbar and lumbosacral  . Depression   . Diabetes mellitus    type 2  . Diabetic peripheral neuropathy ([place])   . Facet syndrome, lumbar   . Gastric ulcer    w/o hemorrhage  . Hyperlipidemia   . Hypertension   . Insomnia   . Lumbosacral root lesions, not elsewhere classified   . Neurogenic bladder   . Obesity   . Osteopenia   . Other symptoms referable to back   .  Post-menopausal   . PUD (peptic ulcer disease)   . 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 Spinal stenosis, lumbar region, without neurogenic claudication   . Thoracic spondylosis without myelopathy   . Thyroid disease    hypo  . Tobacco dependence   . Unspecified hereditary and idiopathic peripheral neuropathy     Past Surgical History:  Procedure Laterality Date  . ANKLE RECONSTRUCTION     left ankle, plate and pins   . bilateral L3 medial branch block    . CHOLECYSTECTOMY    . fibroid tumor removal    . fluoroscopic L5 dorsal medial branch bloc    . RENAL ARTERY STENT  11/28/2010   left  . REPLACEMENT TOTAL KNEE      Social History   Socioeconomic History  . Marital status: Widowed    Spouse name: Not on file  . Number of children: Not on file  . Years of education: Not on file  . Highest education level: Not on file  Occupational History  . Not on file  Social Needs  . Financial resource strain: Not on file  . Food insecurity:    Worry: Not on  file    Inability: Not on file  . Transportation needs:    Medical: Not on file    Non-medical: Not on file  Tobacco Use  . Smoking status: Current Every Day Smoker    Packs/day: 0.75    Years: 49.00    Pack years: 36.75    Types: Cigarettes  . Smokeless tobacco: Never Used  . Tobacco comment: pt states she is going to try the E-cig  Substance and Sexual Activity  . Alcohol use: No    Alcohol/week: 0.0 oz  . Drug use: No  . Sexual activity: Not on file  Lifestyle  . Physical activity:    Days per week: Not on file    Minutes per session: Not on file  . Stress: Not on file  Relationships  . Social connections:    Talks on phone: Not on file    Gets together: Not on file    Attends religious service: Not on file    Active member of club or organization: Not on file    Attends meetings of clubs or organizations: Not on file    Relationship status: Not on file  . Intimate partner violence:     Fear of current or ex partner: Not on file    Emotionally abused: Not on file    Physically abused: Not on file    Forced sexual activity: Not on file  Other Topics Concern  . Not on file  Social History Narrative  . Not on file    Family History  Problem Relation Age of Onset  . Stroke Mother   . Heart disease Father 47       heart attack  . Hypertension Father   . Cancer Father        lung  . Diabetes Paternal Aunt        insulin dependent    Outpatient Encounter Medications as of 11/13/2017  Medication Sig  . alendronate (FOSAMAX) 70 MG tablet TAKE 1 TAB EVERY 7 DAYS. TAKE IN THE MORNING WITH A FULL GLASS OF WATER ON AN EMPTY STOMACH. DO NOT INGEST ANY OTHER FOOD OR DRINK OR LIE D  . AMBULATORY NON FORMULARY MEDICATION Medication Name: order overnight pulse ox.  Dx COPD with SOB at night.  . AMBULATORY NON FORMULARY MEDICATION Medication Name: Auto Pap set to 5cm water pressure with 2 liter nocturnal oxygen as she desats as well. AHI 21.  After one week fax download form machine so we can bettter set her CPAP.  Fax to [facility].  Marland Kitchen aspirin EC 81 MG tablet Take 81 mg by mouth daily.  . blood glucose meter kit and supplies KIT Dispense based on patient and insurance preference. Use up to four times daily as directed Dx E11.40  . clopidogrel (PLAVIX) 75 MG tablet TAKE 1 TABLET BY MOUTH EVERY DAY  . dicyclomine (BENTYL) 20 MG tablet Take 1 tablet (20 mg total) by mouth 3 (three) times daily as needed for spasms.  . diphenoxylate-atropine (LOMOTIL) 2.5-0.025 MG tablet Take 1-2 tablets by mouth 2 (two) times daily as needed for diarrhea or loose stools.  Marland Kitchen FLUoxetine (PROZAC) 40 MG capsule Take 2 capsules (80 mg total) by mouth daily.  . furosemide (LASIX) 40 MG tablet TAKE 1 OR 2 TABLETS EVERY MORNING.  Marland Kitchen gabapentin (NEURONTIN) 400 MG capsule TAKE ONE CAPSULE THREE TIMES DAILY AS NEEDED (Patient taking differently: 4 (four) times daily. TAKE ONE CAPSULE THREE TIMES DAILY AS NEEDED)   . GAMUNEX-C 10  GM/100ML SOLN   . glipiZIDE (GLUCOTROL) 5 MG tablet Take 1/2 tablet (2.5 mg total) by mouth daily before breakfast.  . HYDROcodone-acetaminophen (NORCO) 10-325 MG per tablet Take 1 tablet by mouth every 6 (six) hours as needed. Do not take more that 5 tabs a day  . Insulin Glargine (LANTUS SOLOSTAR) 100 UNIT/ML Solostar Pen Inject 15-20 Units into the skin at bedtime.  . Lancets MISC by Does not apply route.    Marland Kitchen levothyroxine (SYNTHROID, LEVOTHROID) 100 MCG tablet Take 1 tablet (100 mcg total) by mouth daily before breakfast. Need lab work  . lidocaine (XYLOCAINE) 5 % ointment Apply 1 application topically daily. 2 hours before skin procedure.  . metFORMIN (GLUCOPHAGE) 500 MG tablet Take 1 tablet (500 mg total) by mouth 3 (three) times daily.  . metoprolol tartrate (LOPRESSOR) 100 MG tablet Take 1 tablet (100 mg total) by mouth 2 (two) times daily.  . nitroGLYCERIN (NITROSTAT) 0.4 MG SL tablet Place 1 tablet (0.4 mg total) under the tongue every 5 (five) minutes as needed.  Marland Kitchen PRODIGY NO CODING BLOOD GLUC test strip USE AS DIRECTED  . simvastatin (ZOCOR) 40 MG tablet Take 1 tablet (40 mg total) by mouth daily at 6 PM.  . SPIRIVA HANDIHALER 18 MCG inhalation capsule Place 1 capsule (18 mcg total) into inhaler and inhale daily.  Marland Kitchen zolpidem (AMBIEN) 5 MG tablet Take 1 tablet (5 mg total) by mouth at bedtime.  . [DISCONTINUED] dicyclomine (BENTYL) 20 MG tablet Take 1 tablet (20 mg total) by mouth 3 (three) times daily as needed for spasms.  . [DISCONTINUED] diphenoxylate-atropine (LOMOTIL) 2.5-0.025 MG tablet Take 1-2 tablets by mouth 2 (two) times daily as needed for diarrhea or loose stools.  . [DISCONTINUED] LANTUS SOLOSTAR 100 UNIT/ML Solostar Pen inject 48 UNITS daily  . [DISCONTINUED] zolpidem (AMBIEN) 5 MG tablet Take 1 tablet (5 mg total) by mouth at bedtime. LAST REFILL. YOU MUST KEEP APPOINTMENT FOR FUTURE REFILLS  . AMBULATORY NON FORMULARY MEDICATION Medication Name:  Nebulizer machine with equipment.  Diagnosis COPD.  Patient is primarily homebound.  Is faxed to [facility]. She is an established patient.  . levalbuterol (XOPENEX) 0.63 MG/3ML nebulizer solution Take 3 mLs (0.63 mg total) by nebulization every 4 (four) hours as needed for wheezing or shortness of breath.  . [DISCONTINUED] AMBULATORY NON FORMULARY MEDICATION Medication Name: rolling walker with Seat. Dx: Lower extremity weaknesss.  Diabetic Neuropathy (E11.42)  . [DISCONTINUED] Azelastine HCl 0.15 % SOLN Place 2 sprays into the nose 2 (two) times daily.  . [DISCONTINUED] VENTOLIN HFA 108 (90 BASE) MCG/ACT inhaler Inhale 2 puffs into the lungs every 6 (six) hours as needed for wheezi ng.   No facility-administered encounter medications on file as of 11/13/2017.         Review of Systems: No fevers, chills, night sweats, weight loss, chest pain, or shortness of breath.   Objective:    General: Well Developed, well nourished, and in no acute distress.  Neuro: Alert and oriented x3, extra-ocular muscles intact, sensation grossly intact.  HEENT: Normocephalic, atraumatic  Skin: Warm and dry, no rashes. Cardiac: Regular rate and rhythm, no murmurs rubs or gallops, no lower extremity edema.  Respiratory: Clear to auscultation bilaterally. Not using accessory muscles, speaking in full sentences.   Impression and Recommendations:    DM -.  Hemoglobin A1c of 5.3 today.  Continue current regimen.  Follow-up in 4-6 months.  COPD -discontinue albuterol since it causes palpitations and switched to levalbuterol.  New prescription sent  to pharmacy.  When I try to get her a nebulizer machine so to be more affordable.  Continue Spiriva.  He is still smoking.  Insomnia -filled her Ambien today.  Osteoporosis-due to check vitamin D level.  CKD 3-due to recheck renal function.  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    Mid/Upper back pain - will get xray of thoracic spine. Concerning for  vertebral fracture.    Chronic functional diarrhea - refilled her Lomotil.    Hypothyroidism-due to recheck TSH today make adjustments as needed.

## 2017-11-13 NOTE — Telephone Encounter (Signed)
Call pt: I did order xray for her back pain she has been having. She can go anytime during usual hours. Please tell her I am sorry.. I forgot to order while she was here.

## 2017-11-14 LAB — CBC WITH DIFFERENTIAL/PLATELET
BASOS ABS: 68 {cells}/uL (ref 0–200)
Basophils Relative: 0.9 %
EOS ABS: 263 {cells}/uL (ref 15–500)
EOS PCT: 3.5 %
HEMATOCRIT: 39.3 % (ref 35.0–45.0)
Hemoglobin: 13.5 g/dL (ref 11.7–15.5)
LYMPHS ABS: 1890 {cells}/uL (ref 850–3900)
MCH: 32.4 pg (ref 27.0–33.0)
MCHC: 34.4 g/dL (ref 32.0–36.0)
MCV: 94.2 fL (ref 80.0–100.0)
MPV: 12.1 fL (ref 7.5–12.5)
Monocytes Relative: 10.4 %
Neutro Abs: 4500 cells/uL (ref 1500–7800)
Neutrophils Relative %: 60 %
Platelets: 274 10*3/uL (ref 140–400)
RBC: 4.17 10*6/uL (ref 3.80–5.10)
RDW: 13.2 % (ref 11.0–15.0)
Total Lymphocyte: 25.2 %
WBC mixed population: 780 cells/uL (ref 200–950)
WBC: 7.5 10*3/uL (ref 3.8–10.8)

## 2017-11-14 LAB — COMPLETE METABOLIC PANEL WITH GFR
AG RATIO: 0.8 (calc) — AB (ref 1.0–2.5)
ALT: 11 U/L (ref 6–29)
AST: 15 U/L (ref 10–35)
Albumin: 3.7 g/dL (ref 3.6–5.1)
Alkaline phosphatase (APISO): 44 U/L (ref 33–130)
BILIRUBIN TOTAL: 1 mg/dL (ref 0.2–1.2)
BUN/Creatinine Ratio: 14 (calc) (ref 6–22)
BUN: 17 mg/dL (ref 7–25)
CHLORIDE: 97 mmol/L — AB (ref 98–110)
CO2: 30 mmol/L (ref 20–32)
Calcium: 9.6 mg/dL (ref 8.6–10.4)
Creat: 1.21 mg/dL — ABNORMAL HIGH (ref 0.60–0.93)
GFR, Est African American: 51 mL/min/{1.73_m2} — ABNORMAL LOW (ref >=60)
GFR, Est Non African American: 44 mL/min/{1.73_m2} — ABNORMAL LOW (ref >=60)
GLUCOSE: 95 mg/dL (ref 65–99)
Globulin: 4.9 g/dL (calc) — ABNORMAL HIGH (ref 1.9–3.7)
POTASSIUM: 4 mmol/L (ref 3.5–5.3)
Sodium: 136 mmol/L (ref 135–146)
Total Protein: 8.6 g/dL — ABNORMAL HIGH (ref 6.1–8.1)

## 2017-11-14 LAB — VITAMIN D 25 HYDROXY (VIT D DEFICIENCY, FRACTURES): VIT D 25 HYDROXY: 36 ng/mL (ref 30–100)

## 2017-11-14 LAB — TSH: TSH: 4.24 mIU/L (ref 0.40–4.50)

## 2017-11-14 NOTE — Telephone Encounter (Signed)
Pt advised, she will have imaging completed.

## 2017-11-19 ENCOUNTER — Telehealth: Payer: Self-pay

## 2017-11-19 MED ORDER — ALBUTEROL SULFATE 1.25 MG/3ML IN NEBU: 1.0000 | INHALATION_SOLUTION | Freq: Four times a day (QID) | RESPIRATORY_TRACT | 12 refills | Status: DC | PRN

## 2017-11-19 NOTE — Telephone Encounter (Signed)
[name] left a message stating she received the nebulizer but did not receive medication.

## 2017-11-19 NOTE — Telephone Encounter (Signed)
Prescription printed for albuterol vials.  Okay to fax to [facility].

## 2017-11-20 MED ORDER — ALBUTEROL SULFATE 1.25 MG/3ML IN NEBU: 1.0000 | INHALATION_SOLUTION | Freq: Four times a day (QID) | RESPIRATORY_TRACT | 12 refills | Status: DC | PRN

## 2017-11-20 NOTE — Telephone Encounter (Signed)
Patient states the medication has to go to the pharmacy. Sent medication to [facility].

## 2017-11-22 DIAGNOSIS — E785 Hyperlipidemia, unspecified: Secondary | ICD-10-CM | POA: Diagnosis not present

## 2017-11-22 DIAGNOSIS — H2511 Age-related nuclear cataract, right eye: Secondary | ICD-10-CM | POA: Diagnosis not present

## 2017-11-22 DIAGNOSIS — G908 Other disorders of autonomic nervous system: Secondary | ICD-10-CM | POA: Diagnosis not present

## 2017-11-22 DIAGNOSIS — Z961 Presence of intraocular lens: Secondary | ICD-10-CM | POA: Diagnosis not present

## 2017-11-22 DIAGNOSIS — Z794 Long term (current) use of insulin: Secondary | ICD-10-CM | POA: Diagnosis not present

## 2017-11-22 DIAGNOSIS — Z9842 Cataract extraction status, left eye: Secondary | ICD-10-CM | POA: Diagnosis not present

## 2017-11-22 DIAGNOSIS — Z79899 Other long term (current) drug therapy: Secondary | ICD-10-CM | POA: Diagnosis not present

## 2017-11-22 DIAGNOSIS — E119 Type 2 diabetes mellitus without complications: Secondary | ICD-10-CM | POA: Diagnosis not present

## 2017-11-22 DIAGNOSIS — I10 Essential (primary) hypertension: Secondary | ICD-10-CM | POA: Diagnosis not present

## 2017-11-22 DIAGNOSIS — E039 Hypothyroidism, unspecified: Secondary | ICD-10-CM | POA: Diagnosis not present

## 2017-11-22 DIAGNOSIS — H25811 Combined forms of age-related cataract, right eye: Secondary | ICD-10-CM | POA: Diagnosis not present

## 2017-11-22 DIAGNOSIS — G4730 Sleep apnea, unspecified: Secondary | ICD-10-CM | POA: Diagnosis not present

## 2017-11-22 DIAGNOSIS — F17210 Nicotine dependence, cigarettes, uncomplicated: Secondary | ICD-10-CM | POA: Diagnosis not present

## 2017-11-23 ENCOUNTER — Encounter: Payer: Self-pay | Admitting: Family Medicine

## 2017-11-23 DIAGNOSIS — E119 Type 2 diabetes mellitus without complications: Secondary | ICD-10-CM | POA: Diagnosis not present

## 2017-11-23 DIAGNOSIS — H353211 Exudative age-related macular degeneration, right eye, with active choroidal neovascularization: Secondary | ICD-10-CM | POA: Diagnosis not present

## 2017-11-23 DIAGNOSIS — H527 Unspecified disorder of refraction: Secondary | ICD-10-CM | POA: Diagnosis not present

## 2017-11-23 DIAGNOSIS — H353122 Nonexudative age-related macular degeneration, left eye, intermediate dry stage: Secondary | ICD-10-CM | POA: Diagnosis not present

## 2017-11-23 DIAGNOSIS — Z961 Presence of intraocular lens: Secondary | ICD-10-CM | POA: Diagnosis not present

## 2017-11-23 LAB — HM DIABETES EYE EXAM

## 2017-11-26 ENCOUNTER — Other Ambulatory Visit: Payer: Self-pay | Admitting: Family Medicine

## 2017-12-03 ENCOUNTER — Telehealth: Payer: Self-pay

## 2017-12-03 NOTE — Telephone Encounter (Signed)
[name] called and reports since she started the albuterol neb solution her blood sugars have been around 140 mg/dl. It is normally below 100 mg/dl. Please advise.

## 2017-12-04 DIAGNOSIS — M7551 Bursitis of right shoulder: Secondary | ICD-10-CM | POA: Diagnosis not present

## 2017-12-04 DIAGNOSIS — M25511 Pain in right shoulder: Secondary | ICD-10-CM | POA: Diagnosis not present

## 2017-12-04 NOTE — Telephone Encounter (Signed)
Unfortunately I think we are stuck between a rock and a hard place on this 1.  She needs the albuterol to help her breathing so even if it is causing a slight bump in her blood sugars that we may have to just make an adjustment to her metformin.  We can always increase to her 2000 mg twice a day.  I think she is currently taking 500 mg 3 times a day.  This should help.  So just make sure drinking plenty of water and really watching carb intake

## 2017-12-04 NOTE — Telephone Encounter (Signed)
Correction: 1000mg  bid on the metformin.

## 2017-12-04 NOTE — Telephone Encounter (Signed)
Patient advised of recommendations. She states she is going to work on her diet. I advised if her blood sugar goes up more to give [place] a call.

## 2017-12-05 NOTE — Telephone Encounter (Signed)
Patient advised of recommendations.  

## 2017-12-07 ENCOUNTER — Other Ambulatory Visit: Payer: Self-pay | Admitting: Family Medicine

## 2017-12-10 ENCOUNTER — Ambulatory Visit: Payer: Medicare Other | Admitting: Podiatry

## 2017-12-17 ENCOUNTER — Ambulatory Visit: Payer: Medicare Other | Admitting: Podiatry

## 2017-12-19 DIAGNOSIS — M47816 Spondylosis without myelopathy or radiculopathy, lumbar region: Secondary | ICD-10-CM | POA: Diagnosis not present

## 2017-12-19 DIAGNOSIS — Z79891 Long term (current) use of opiate analgesic: Secondary | ICD-10-CM | POA: Diagnosis not present

## 2017-12-19 DIAGNOSIS — G894 Chronic pain syndrome: Secondary | ICD-10-CM | POA: Diagnosis not present

## 2017-12-19 DIAGNOSIS — M1990 Unspecified osteoarthritis, unspecified site: Secondary | ICD-10-CM | POA: Diagnosis not present

## 2017-12-19 DIAGNOSIS — M5136 Other intervertebral disc degeneration, lumbar region: Secondary | ICD-10-CM | POA: Diagnosis not present

## 2017-12-19 DIAGNOSIS — M25511 Pain in right shoulder: Secondary | ICD-10-CM | POA: Diagnosis not present

## 2017-12-24 ENCOUNTER — Other Ambulatory Visit: Payer: Self-pay | Admitting: Family Medicine

## 2017-12-24 DIAGNOSIS — H2513 Age-related nuclear cataract, bilateral: Secondary | ICD-10-CM | POA: Diagnosis not present

## 2017-12-24 DIAGNOSIS — H353211 Exudative age-related macular degeneration, right eye, with active choroidal neovascularization: Secondary | ICD-10-CM | POA: Diagnosis not present

## 2017-12-24 DIAGNOSIS — H31113 Age-related choroidal atrophy, bilateral: Secondary | ICD-10-CM | POA: Diagnosis not present

## 2017-12-24 DIAGNOSIS — H353123 Nonexudative age-related macular degeneration, left eye, advanced atrophic without subfoveal involvement: Secondary | ICD-10-CM | POA: Diagnosis not present

## 2017-12-27 ENCOUNTER — Encounter: Payer: Self-pay | Admitting: Sports Medicine

## 2017-12-27 ENCOUNTER — Ambulatory Visit (INDEPENDENT_AMBULATORY_CARE_PROVIDER_SITE_OTHER): Payer: Medicare Other | Admitting: Sports Medicine

## 2017-12-27 ENCOUNTER — Other Ambulatory Visit: Payer: Self-pay | Admitting: Family Medicine

## 2017-12-27 DIAGNOSIS — M62838 Other muscle spasm: Secondary | ICD-10-CM

## 2017-12-27 MED ORDER — PREDNISONE 50 MG PO TABS: ORAL_TABLET | ORAL | 0 refills | Status: DC

## 2017-12-27 NOTE — Assessment & Plan Note (Signed)
Home health physical therapy, 5 days of prednisone. Degenerative disc disease on x-rays. Pain is at the left occipital prominence. MRI if no better.

## 2017-12-27 NOTE — Progress Notes (Signed)
Subjective:    CC: lump behind left ear  HPI: [name] is a 72yo female that is presenting in clinic today with a 2 day history of a lump behind her left ear.  Pt reports that the lump was dime sized on the day of onset, and grew to about the size of a quarter by the second day.  Pt reports that the lump only hurts when palpated.  Pt reports chronic symptoms of sinus pain.  Pt also reports  chills after bouts of diiarrhea, but denies night sweats, and fevers, and changes in her hearing.     I reviewed the past medical history, family history, social history, surgical history, and allergies today and no changes were needed.  Please see the problem list section below in epic for further details.  Past Medical History: Past Medical History:  Diagnosis Date  . Arthritis   . Charcot-Marie-Tooth disease   . COPD (chronic obstructive pulmonary disease) ([place])   . DDD (degenerative disc disease)    Lumbar and lumbosacral  . Depression   . Diabetes mellitus    type 2  . Diabetic peripheral neuropathy ([place])   . Facet syndrome, lumbar   . Gastric ulcer    w/o hemorrhage  . Hyperlipidemia   . Hypertension   . Insomnia   . Lumbosacral root lesions, not elsewhere classified   . Neurogenic bladder   . Obesity   . Osteopenia   . Other symptoms referable to back   . Post-menopausal   . PUD (peptic ulcer disease)   . 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 Spinal stenosis, lumbar region, without neurogenic claudication   . Thoracic spondylosis without myelopathy   . Thyroid disease    hypo  . Tobacco dependence   . Unspecified hereditary and idiopathic peripheral neuropathy    Past Surgical History: Past Surgical History:  Procedure Laterality Date  . ANKLE RECONSTRUCTION     left ankle, plate and pins   . bilateral L3 medial branch block    . CHOLECYSTECTOMY    . fibroid tumor removal    . fluoroscopic L5 dorsal medial branch bloc    . RENAL ARTERY STENT   11/28/2010   left  . REPLACEMENT TOTAL KNEE     Social History: Social History   Socioeconomic History  . Marital status: Widowed    Spouse name: Not on file  . Number of children: Not on file  . Years of education: Not on file  . Highest education level: Not on file  Occupational History  . Not on file  Social Needs  . Financial resource strain: Not on file  . Food insecurity:    Worry: Not on file    Inability: Not on file  . Transportation needs:    Medical: Not on file    Non-medical: Not on file  Tobacco Use  . Smoking status: Current Every Day Smoker    Packs/day: 0.75    Years: 49.00    Pack years: 36.75    Types: Cigarettes  . Smokeless tobacco: Never Used  . Tobacco comment: pt states she is going to try the E-cig  Substance and Sexual Activity  . Alcohol use: No    Alcohol/week: 0.0 oz  . Drug use: No  . Sexual activity: Not on file  Lifestyle  . Physical activity:    Days per week: Not on file    Minutes per session: Not on file  .  Stress: Not on file  Relationships  . Social connections:    Talks on phone: Not on file    Gets together: Not on file    Attends religious service: Not on file    Active member of club or organization: Not on file    Attends meetings of clubs or organizations: Not on file    Relationship status: Not on file  Other Topics Concern  . Not on file  Social History Narrative  . Not on file   Family History: Family History  Problem Relation Age of Onset  . Stroke Mother   . Heart disease Father 72       heart attack  . Hypertension Father   . Cancer Father        lung  . Diabetes Paternal Aunt        insulin dependent   Allergies: Allergies  Allergen Reactions  . Albuterol Sulfate Palpitations  . Varenicline Nausea And Vomiting   Medications: See med rec.  Review of Systems: No fevers, chills, night sweats, weight loss, chest pain, or shortness of breath.   Objective:    General: Well Developed, well  nourished, and in no acute distress.  Neuro: Alert and oriented x3, extra-ocular muscles intact, sensation grossly intact.  HEENT: Normocephalic, atraumatic, pupils equal round reactive to light, neck supple, no masses, no lymphadenopathy, thyroid nonpalpable.  Skin: Warm and dry, no rashes. Cardiac: Regular rate and rhythm, no murmurs rubs or gallops, no lower extremity edema.  Respiratory: Not using accessory muscles, speaking in full sentences.  Mild Sinus pain to palpation  at V2 distribution No erythema, or swelling postauricularly (left side); non-mobile protuberance felt postauricularly  (left side) Pain with palpation at occipital prominence Neck: Full Range of Motion    Impression and Recommendations:    Assessment-Cervical Paraspinal Muscle Spasm (most likely Trapezius) Pt was prescribed a brief course of prednisone to decrease inflammatory process that exacerbates the pain felt from muscle spasms.  A referral for home PT was made and pt was counseled on next steps (MRI imaging for planning and injection) if symptoms do not improve). Pt was advised to follow up to evaluate resolution of symptoms in one month.   ___________________________________________ Gwen Her. Dianah Field, M.D., ABFM., CAQSM. Primary Care and [place] Instructor of [place] [facility]

## 2017-12-28 ENCOUNTER — Other Ambulatory Visit: Payer: Self-pay

## 2017-12-28 MED ORDER — LEVOTHYROXINE SODIUM 100 MCG PO TABS: 100.0000 ug | ORAL_TABLET | Freq: Every day | ORAL | 0 refills | Status: DC

## 2017-12-28 NOTE — Telephone Encounter (Signed)
Pt needs refills on thyroid meds. Previously used [facility], now requesting to have it sent to [facility].  Labs last done on 11-13-17.

## 2017-12-29 DIAGNOSIS — G600 Hereditary motor and sensory neuropathy: Secondary | ICD-10-CM | POA: Diagnosis not present

## 2017-12-29 DIAGNOSIS — E11610 Type 2 diabetes mellitus with diabetic neuropathic arthropathy: Secondary | ICD-10-CM | POA: Diagnosis not present

## 2017-12-29 DIAGNOSIS — N183 Chronic kidney disease, stage 3 (moderate): Secondary | ICD-10-CM | POA: Diagnosis not present

## 2017-12-29 DIAGNOSIS — J449 Chronic obstructive pulmonary disease, unspecified: Secondary | ICD-10-CM | POA: Diagnosis not present

## 2017-12-29 DIAGNOSIS — E1122 Type 2 diabetes mellitus with diabetic chronic kidney disease: Secondary | ICD-10-CM | POA: Diagnosis not present

## 2017-12-29 DIAGNOSIS — I129 Hypertensive chronic kidney disease with stage 1 through stage 4 chronic kidney disease, or unspecified chronic kidney disease: Secondary | ICD-10-CM | POA: Diagnosis not present

## 2018-01-02 DIAGNOSIS — J449 Chronic obstructive pulmonary disease, unspecified: Secondary | ICD-10-CM | POA: Diagnosis not present

## 2018-01-02 DIAGNOSIS — E11610 Type 2 diabetes mellitus with diabetic neuropathic arthropathy: Secondary | ICD-10-CM | POA: Diagnosis not present

## 2018-01-02 DIAGNOSIS — E1122 Type 2 diabetes mellitus with diabetic chronic kidney disease: Secondary | ICD-10-CM | POA: Diagnosis not present

## 2018-01-02 DIAGNOSIS — N183 Chronic kidney disease, stage 3 (moderate): Secondary | ICD-10-CM | POA: Diagnosis not present

## 2018-01-02 DIAGNOSIS — I129 Hypertensive chronic kidney disease with stage 1 through stage 4 chronic kidney disease, or unspecified chronic kidney disease: Secondary | ICD-10-CM | POA: Diagnosis not present

## 2018-01-02 DIAGNOSIS — G600 Hereditary motor and sensory neuropathy: Secondary | ICD-10-CM | POA: Diagnosis not present

## 2018-01-07 DIAGNOSIS — G600 Hereditary motor and sensory neuropathy: Secondary | ICD-10-CM | POA: Diagnosis not present

## 2018-01-07 DIAGNOSIS — E11610 Type 2 diabetes mellitus with diabetic neuropathic arthropathy: Secondary | ICD-10-CM | POA: Diagnosis not present

## 2018-01-07 DIAGNOSIS — E1122 Type 2 diabetes mellitus with diabetic chronic kidney disease: Secondary | ICD-10-CM | POA: Diagnosis not present

## 2018-01-07 DIAGNOSIS — N183 Chronic kidney disease, stage 3 (moderate): Secondary | ICD-10-CM | POA: Diagnosis not present

## 2018-01-07 DIAGNOSIS — I129 Hypertensive chronic kidney disease with stage 1 through stage 4 chronic kidney disease, or unspecified chronic kidney disease: Secondary | ICD-10-CM | POA: Diagnosis not present

## 2018-01-07 DIAGNOSIS — J449 Chronic obstructive pulmonary disease, unspecified: Secondary | ICD-10-CM | POA: Diagnosis not present

## 2018-01-09 ENCOUNTER — Other Ambulatory Visit: Payer: Self-pay | Admitting: Family Medicine

## 2018-01-09 ENCOUNTER — Other Ambulatory Visit: Payer: Self-pay | Admitting: *Deleted

## 2018-01-09 DIAGNOSIS — I10 Essential (primary) hypertension: Secondary | ICD-10-CM

## 2018-01-09 DIAGNOSIS — Z794 Long term (current) use of insulin: Secondary | ICD-10-CM

## 2018-01-09 DIAGNOSIS — E0840 Diabetes mellitus due to underlying condition with diabetic neuropathy, unspecified: Secondary | ICD-10-CM

## 2018-01-09 MED ORDER — PEN NEEDLES 32G X 4 MM MISC: 1.0000 | Freq: Every day | 6 refills | Status: DC

## 2018-01-09 MED ORDER — METOPROLOL TARTRATE 100 MG PO TABS: ORAL_TABLET | ORAL | 1 refills | Status: DC

## 2018-01-10 DIAGNOSIS — E11610 Type 2 diabetes mellitus with diabetic neuropathic arthropathy: Secondary | ICD-10-CM | POA: Diagnosis not present

## 2018-01-10 DIAGNOSIS — E1122 Type 2 diabetes mellitus with diabetic chronic kidney disease: Secondary | ICD-10-CM | POA: Diagnosis not present

## 2018-01-10 DIAGNOSIS — J449 Chronic obstructive pulmonary disease, unspecified: Secondary | ICD-10-CM | POA: Diagnosis not present

## 2018-01-10 DIAGNOSIS — I129 Hypertensive chronic kidney disease with stage 1 through stage 4 chronic kidney disease, or unspecified chronic kidney disease: Secondary | ICD-10-CM | POA: Diagnosis not present

## 2018-01-10 DIAGNOSIS — N183 Chronic kidney disease, stage 3 (moderate): Secondary | ICD-10-CM | POA: Diagnosis not present

## 2018-01-10 DIAGNOSIS — G600 Hereditary motor and sensory neuropathy: Secondary | ICD-10-CM | POA: Diagnosis not present

## 2018-01-14 ENCOUNTER — Ambulatory Visit (INDEPENDENT_AMBULATORY_CARE_PROVIDER_SITE_OTHER): Payer: Medicare Other | Admitting: Podiatry

## 2018-01-14 DIAGNOSIS — B351 Tinea unguium: Secondary | ICD-10-CM | POA: Diagnosis not present

## 2018-01-14 DIAGNOSIS — E1142 Type 2 diabetes mellitus with diabetic polyneuropathy: Secondary | ICD-10-CM | POA: Diagnosis not present

## 2018-01-14 NOTE — Patient Instructions (Signed)
Seen for pain in 3rd toe left. All nails debrided. New lesion debrided and padded 3rd toe left. Return in one week.

## 2018-01-15 ENCOUNTER — Encounter: Payer: Self-pay | Admitting: Podiatry

## 2018-01-15 DIAGNOSIS — E11610 Type 2 diabetes mellitus with diabetic neuropathic arthropathy: Secondary | ICD-10-CM | POA: Diagnosis not present

## 2018-01-15 DIAGNOSIS — N183 Chronic kidney disease, stage 3 (moderate): Secondary | ICD-10-CM | POA: Diagnosis not present

## 2018-01-15 DIAGNOSIS — G600 Hereditary motor and sensory neuropathy: Secondary | ICD-10-CM | POA: Diagnosis not present

## 2018-01-15 DIAGNOSIS — I129 Hypertensive chronic kidney disease with stage 1 through stage 4 chronic kidney disease, or unspecified chronic kidney disease: Secondary | ICD-10-CM | POA: Diagnosis not present

## 2018-01-15 DIAGNOSIS — J449 Chronic obstructive pulmonary disease, unspecified: Secondary | ICD-10-CM | POA: Diagnosis not present

## 2018-01-15 DIAGNOSIS — E1122 Type 2 diabetes mellitus with diabetic chronic kidney disease: Secondary | ICD-10-CM | POA: Diagnosis not present

## 2018-01-15 NOTE — Progress Notes (Signed)
Subjective: 72 y.o. year old female patient presents complaining of painful toe 3rd left. Patient has peripheral neuropathy and unable to straighten her toe while walking. Patient uses walker. Stated that her blood sugar is under control.  Objective: Dermatologic: Thick yellow deformed nails x 10. Ulcerating corn distal end 3rd toe left. Vascular: Pedal pulses are all palpable. Orthopedic: Contracted lesser digits bilateral. Hyperextended great toe right foot.  Neurologic: Loss of protective sensory perception bilateral.  Assessment: Dystrophic mycotic nails x 10. Ulcerating corn distal end 3rd left. Peripheral neuropathy. Diabetic under control.  Treatment: All mycotic nails debrided.  Ulcerating corn debrided and aperture pad and Amerigel dressing placed with home care instruction. Return in one week for follow up on ulcer 3rd toe left.

## 2018-01-17 DIAGNOSIS — N183 Chronic kidney disease, stage 3 (moderate): Secondary | ICD-10-CM | POA: Diagnosis not present

## 2018-01-17 DIAGNOSIS — G600 Hereditary motor and sensory neuropathy: Secondary | ICD-10-CM | POA: Diagnosis not present

## 2018-01-17 DIAGNOSIS — I129 Hypertensive chronic kidney disease with stage 1 through stage 4 chronic kidney disease, or unspecified chronic kidney disease: Secondary | ICD-10-CM | POA: Diagnosis not present

## 2018-01-17 DIAGNOSIS — E11610 Type 2 diabetes mellitus with diabetic neuropathic arthropathy: Secondary | ICD-10-CM | POA: Diagnosis not present

## 2018-01-17 DIAGNOSIS — J449 Chronic obstructive pulmonary disease, unspecified: Secondary | ICD-10-CM | POA: Diagnosis not present

## 2018-01-17 DIAGNOSIS — E1122 Type 2 diabetes mellitus with diabetic chronic kidney disease: Secondary | ICD-10-CM | POA: Diagnosis not present

## 2018-01-21 ENCOUNTER — Ambulatory Visit (INDEPENDENT_AMBULATORY_CARE_PROVIDER_SITE_OTHER): Payer: Medicare Other | Admitting: Podiatry

## 2018-01-21 DIAGNOSIS — E1142 Type 2 diabetes mellitus with diabetic polyneuropathy: Secondary | ICD-10-CM

## 2018-01-21 DIAGNOSIS — G600 Hereditary motor and sensory neuropathy: Secondary | ICD-10-CM | POA: Diagnosis not present

## 2018-01-21 DIAGNOSIS — L97521 Non-pressure chronic ulcer of other part of left foot limited to breakdown of skin: Secondary | ICD-10-CM | POA: Diagnosis not present

## 2018-01-21 DIAGNOSIS — M2042 Other hammer toe(s) (acquired), left foot: Secondary | ICD-10-CM | POA: Diagnosis not present

## 2018-01-21 NOTE — Patient Instructions (Signed)
3rd toe left foot has healed well. Needed to keep a pad under the toe sulcus to prevent skin breakdown. Use supplies that were dispensed as instructed. Return in one month.

## 2018-01-22 ENCOUNTER — Encounter: Payer: Self-pay | Admitting: Podiatry

## 2018-01-22 DIAGNOSIS — E1122 Type 2 diabetes mellitus with diabetic chronic kidney disease: Secondary | ICD-10-CM | POA: Diagnosis not present

## 2018-01-22 DIAGNOSIS — J449 Chronic obstructive pulmonary disease, unspecified: Secondary | ICD-10-CM | POA: Diagnosis not present

## 2018-01-22 DIAGNOSIS — N183 Chronic kidney disease, stage 3 (moderate): Secondary | ICD-10-CM | POA: Diagnosis not present

## 2018-01-22 DIAGNOSIS — I129 Hypertensive chronic kidney disease with stage 1 through stage 4 chronic kidney disease, or unspecified chronic kidney disease: Secondary | ICD-10-CM | POA: Diagnosis not present

## 2018-01-22 DIAGNOSIS — G600 Hereditary motor and sensory neuropathy: Secondary | ICD-10-CM | POA: Diagnosis not present

## 2018-01-22 DIAGNOSIS — E11610 Type 2 diabetes mellitus with diabetic neuropathic arthropathy: Secondary | ICD-10-CM | POA: Diagnosis not present

## 2018-01-22 NOTE — Progress Notes (Signed)
Subjective: 72 y.o. year old female patient presents for follow up on ulcerating 3rd digit left foot. Stated that the foot feels better now.  Patient is diagnosed with Diabetic peripheral neuropathy and Charcot Marie Tooth disease. Objective: Dermatologic: distal end of the 3rd digit is covered with thin layer of keratotic tissue with dry base. Vascular: Pedal pulses are not palpable on left foot. Orthopedic: Contracted lesser digits with loss of EDL power. Unable to dorsiflex the digits left foot. Neurologic: Loss of protective sensory perception bilateral.  Assessment: Healed ulcer 3rd left. Peripheral neuropathy. Contracted lesser digits left foot.  Treatment: 1/8" Felt pad with Coban wrap placed with home care instruction to prevent distal end of the toe pressing against weight bearing surface. Stated that she will be able to handle the wrapping. Return in one month to monitor. Supply dispensed with instruction.

## 2018-01-23 DIAGNOSIS — R270 Ataxia, unspecified: Secondary | ICD-10-CM | POA: Diagnosis not present

## 2018-01-23 DIAGNOSIS — F321 Major depressive disorder, single episode, moderate: Secondary | ICD-10-CM | POA: Diagnosis not present

## 2018-01-23 DIAGNOSIS — G6181 Chronic inflammatory demyelinating polyneuritis: Secondary | ICD-10-CM | POA: Diagnosis not present

## 2018-01-24 ENCOUNTER — Other Ambulatory Visit: Payer: Self-pay | Admitting: Family Medicine

## 2018-01-24 ENCOUNTER — Other Ambulatory Visit: Payer: Self-pay | Admitting: *Deleted

## 2018-01-24 MED ORDER — ALENDRONATE SODIUM 70 MG PO TABS: ORAL_TABLET | ORAL | 1 refills | Status: DC

## 2018-01-28 ENCOUNTER — Encounter: Payer: Self-pay | Admitting: Sports Medicine

## 2018-01-28 ENCOUNTER — Ambulatory Visit (INDEPENDENT_AMBULATORY_CARE_PROVIDER_SITE_OTHER): Payer: Medicare Other | Admitting: Sports Medicine

## 2018-01-28 DIAGNOSIS — M62838 Other muscle spasm: Secondary | ICD-10-CM

## 2018-01-28 NOTE — Assessment & Plan Note (Signed)
Symptoms now completely resolved with 5 days of prednisone, home health physical therapy.

## 2018-01-28 NOTE — Progress Notes (Signed)
Subjective:    CC: Follow-up  HPI: Cervical DDD: Resolved  I reviewed the past medical history, family history, social history, surgical history, and allergies today and no changes were needed.  Please see the problem list section below in epic for further details.  Past Medical History: Past Medical History:  Diagnosis Date  . Arthritis   . Charcot-Marie-Tooth disease   . COPD (chronic obstructive pulmonary disease) ([place])   . DDD (degenerative disc disease)    Lumbar and lumbosacral  . Depression   . Diabetes mellitus    type 2  . Diabetic peripheral neuropathy ([place])   . Facet syndrome, lumbar   . Gastric ulcer    w/o hemorrhage  . Hyperlipidemia   . Hypertension   . Insomnia   . Lumbosacral root lesions, not elsewhere classified   . Neurogenic bladder   . Obesity   . Osteopenia   . Other symptoms referable to back   . Post-menopausal   . PUD (peptic ulcer disease)   . 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 Spinal stenosis, lumbar region, without neurogenic claudication   . Thoracic spondylosis without myelopathy   . Thyroid disease    hypo  . Tobacco dependence   . Unspecified hereditary and idiopathic peripheral neuropathy    Past Surgical History: Past Surgical History:  Procedure Laterality Date  . ANKLE RECONSTRUCTION     left ankle, plate and pins   . bilateral L3 medial branch block    . CHOLECYSTECTOMY    . fibroid tumor removal    . fluoroscopic L5 dorsal medial branch bloc    . RENAL ARTERY STENT  11/28/2010   left  . REPLACEMENT TOTAL KNEE     Social History: Social History   Socioeconomic History  . Marital status: Widowed    Spouse name: Not on file  . Number of children: Not on file  . Years of education: Not on file  . Highest education level: Not on file  Occupational History  . Not on file  Social Needs  . Financial resource strain: Not on file  . Food insecurity:    Worry: Not on file    Inability: Not on  file  . Transportation needs:    Medical: Not on file    Non-medical: Not on file  Tobacco Use  . Smoking status: Current Every Day Smoker    Packs/day: 0.75    Years: 49.00    Pack years: 36.75    Types: Cigarettes  . Smokeless tobacco: Never Used  . Tobacco comment: pt states she is going to try the E-cig  Substance and Sexual Activity  . Alcohol use: No    Alcohol/week: 0.0 oz  . Drug use: No  . Sexual activity: Not on file  Lifestyle  . Physical activity:    Days per week: Not on file    Minutes per session: Not on file  . Stress: Not on file  Relationships  . Social connections:    Talks on phone: Not on file    Gets together: Not on file    Attends religious service: Not on file    Active member of club or organization: Not on file    Attends meetings of clubs or organizations: Not on file    Relationship status: Not on file  Other Topics Concern  . Not on file  Social History Narrative  . Not on file   Family History: Family History  Problem Relation Age of Onset  . Stroke Mother   . Heart disease Father 31       heart attack  . Hypertension Father   . Cancer Father        lung  . Diabetes Paternal Aunt        insulin dependent   Allergies: Allergies  Allergen Reactions  . Albuterol Sulfate Palpitations  . Varenicline Nausea And Vomiting   Medications: See med rec.  Review of Systems: No fevers, chills, night sweats, weight loss, chest pain, or shortness of breath.   Objective:    General: Well Developed, well nourished, and in no acute distress.  Neuro: Alert and oriented x3, extra-ocular muscles intact, sensation grossly intact.  HEENT: Normocephalic, atraumatic, pupils equal round reactive to light, neck supple, no masses, no lymphadenopathy, thyroid nonpalpable.  Skin: Warm and dry, no rashes. Cardiac: Regular rate and rhythm, no murmurs rubs or gallops, no lower extremity edema.  Respiratory: Clear to auscultation bilaterally. Not using  accessory muscles, speaking in full sentences. Neck: Negative spurling's Full neck range of motion Grip strength and sensation normal in bilateral hands Strength good C4 to T1 distribution No sensory change to C4 to T1 Reflexes normal  Impression and Recommendations:    Cervical paraspinal muscle spasm Symptoms now completely resolved with 5 days of prednisone, home health physical therapy. ___________________________________________ Gwen Her. Dianah Field, M.D., ABFM., CAQSM. Primary Care and [place] Instructor of [place] [facility]

## 2018-01-29 ENCOUNTER — Other Ambulatory Visit: Payer: Self-pay

## 2018-01-29 DIAGNOSIS — G600 Hereditary motor and sensory neuropathy: Secondary | ICD-10-CM | POA: Diagnosis not present

## 2018-01-29 DIAGNOSIS — E11610 Type 2 diabetes mellitus with diabetic neuropathic arthropathy: Secondary | ICD-10-CM | POA: Diagnosis not present

## 2018-01-29 DIAGNOSIS — I129 Hypertensive chronic kidney disease with stage 1 through stage 4 chronic kidney disease, or unspecified chronic kidney disease: Secondary | ICD-10-CM | POA: Diagnosis not present

## 2018-01-29 DIAGNOSIS — E1122 Type 2 diabetes mellitus with diabetic chronic kidney disease: Secondary | ICD-10-CM | POA: Diagnosis not present

## 2018-01-29 DIAGNOSIS — J449 Chronic obstructive pulmonary disease, unspecified: Secondary | ICD-10-CM | POA: Diagnosis not present

## 2018-01-29 DIAGNOSIS — N183 Chronic kidney disease, stage 3 (moderate): Secondary | ICD-10-CM | POA: Diagnosis not present

## 2018-01-29 MED ORDER — GLIPIZIDE 5 MG PO TABS: ORAL_TABLET | ORAL | 0 refills | Status: DC

## 2018-02-01 ENCOUNTER — Telehealth: Payer: Self-pay | Admitting: Family Medicine

## 2018-02-01 NOTE — Telephone Encounter (Signed)
Received call for verbal orders: Home PT, 1x week for 4 weeks.   [phone] - [facility] [place].   Verbal given.

## 2018-02-01 NOTE — Telephone Encounter (Signed)
I agree. Thank you.

## 2018-02-04 ENCOUNTER — Other Ambulatory Visit: Payer: Self-pay | Admitting: Family Medicine

## 2018-02-04 DIAGNOSIS — I129 Hypertensive chronic kidney disease with stage 1 through stage 4 chronic kidney disease, or unspecified chronic kidney disease: Secondary | ICD-10-CM | POA: Diagnosis not present

## 2018-02-04 DIAGNOSIS — J449 Chronic obstructive pulmonary disease, unspecified: Secondary | ICD-10-CM | POA: Diagnosis not present

## 2018-02-04 DIAGNOSIS — N183 Chronic kidney disease, stage 3 (moderate): Secondary | ICD-10-CM | POA: Diagnosis not present

## 2018-02-04 DIAGNOSIS — G600 Hereditary motor and sensory neuropathy: Secondary | ICD-10-CM | POA: Diagnosis not present

## 2018-02-04 DIAGNOSIS — E11610 Type 2 diabetes mellitus with diabetic neuropathic arthropathy: Secondary | ICD-10-CM | POA: Diagnosis not present

## 2018-02-04 DIAGNOSIS — E1122 Type 2 diabetes mellitus with diabetic chronic kidney disease: Secondary | ICD-10-CM | POA: Diagnosis not present

## 2018-02-11 ENCOUNTER — Other Ambulatory Visit: Payer: Self-pay

## 2018-02-11 DIAGNOSIS — I129 Hypertensive chronic kidney disease with stage 1 through stage 4 chronic kidney disease, or unspecified chronic kidney disease: Secondary | ICD-10-CM | POA: Diagnosis not present

## 2018-02-11 DIAGNOSIS — E1122 Type 2 diabetes mellitus with diabetic chronic kidney disease: Secondary | ICD-10-CM | POA: Diagnosis not present

## 2018-02-11 DIAGNOSIS — J449 Chronic obstructive pulmonary disease, unspecified: Secondary | ICD-10-CM | POA: Diagnosis not present

## 2018-02-11 DIAGNOSIS — N183 Chronic kidney disease, stage 3 (moderate): Secondary | ICD-10-CM | POA: Diagnosis not present

## 2018-02-11 DIAGNOSIS — G600 Hereditary motor and sensory neuropathy: Secondary | ICD-10-CM | POA: Diagnosis not present

## 2018-02-11 DIAGNOSIS — E11610 Type 2 diabetes mellitus with diabetic neuropathic arthropathy: Secondary | ICD-10-CM | POA: Diagnosis not present

## 2018-02-11 MED ORDER — LIDOCAINE 5 % EX OINT: 1.0000 "application " | TOPICAL_OINTMENT | Freq: Every day | CUTANEOUS | 11 refills | Status: DC

## 2018-02-11 NOTE — Telephone Encounter (Signed)
[name] called for a refill on Lidocaine. She states the shingles rash has returned and it is painful.

## 2018-02-12 NOTE — Telephone Encounter (Signed)
Patient advised.

## 2018-02-18 DIAGNOSIS — M25511 Pain in right shoulder: Secondary | ICD-10-CM | POA: Diagnosis not present

## 2018-02-18 DIAGNOSIS — M1990 Unspecified osteoarthritis, unspecified site: Secondary | ICD-10-CM | POA: Diagnosis not present

## 2018-02-18 DIAGNOSIS — M5136 Other intervertebral disc degeneration, lumbar region: Secondary | ICD-10-CM | POA: Diagnosis not present

## 2018-02-18 DIAGNOSIS — G894 Chronic pain syndrome: Secondary | ICD-10-CM | POA: Diagnosis not present

## 2018-02-18 DIAGNOSIS — Z79891 Long term (current) use of opiate analgesic: Secondary | ICD-10-CM | POA: Diagnosis not present

## 2018-02-18 DIAGNOSIS — M47816 Spondylosis without myelopathy or radiculopathy, lumbar region: Secondary | ICD-10-CM | POA: Diagnosis not present

## 2018-02-19 DIAGNOSIS — I129 Hypertensive chronic kidney disease with stage 1 through stage 4 chronic kidney disease, or unspecified chronic kidney disease: Secondary | ICD-10-CM | POA: Diagnosis not present

## 2018-02-19 DIAGNOSIS — N183 Chronic kidney disease, stage 3 (moderate): Secondary | ICD-10-CM | POA: Diagnosis not present

## 2018-02-19 DIAGNOSIS — J449 Chronic obstructive pulmonary disease, unspecified: Secondary | ICD-10-CM | POA: Diagnosis not present

## 2018-02-19 DIAGNOSIS — E11610 Type 2 diabetes mellitus with diabetic neuropathic arthropathy: Secondary | ICD-10-CM | POA: Diagnosis not present

## 2018-02-19 DIAGNOSIS — E1122 Type 2 diabetes mellitus with diabetic chronic kidney disease: Secondary | ICD-10-CM | POA: Diagnosis not present

## 2018-02-19 DIAGNOSIS — G600 Hereditary motor and sensory neuropathy: Secondary | ICD-10-CM | POA: Diagnosis not present

## 2018-02-25 ENCOUNTER — Encounter: Payer: Self-pay | Admitting: Podiatry

## 2018-02-25 ENCOUNTER — Ambulatory Visit (INDEPENDENT_AMBULATORY_CARE_PROVIDER_SITE_OTHER): Payer: Medicare Other | Admitting: Podiatry

## 2018-02-25 DIAGNOSIS — L97521 Non-pressure chronic ulcer of other part of left foot limited to breakdown of skin: Secondary | ICD-10-CM | POA: Diagnosis not present

## 2018-02-25 DIAGNOSIS — M2042 Other hammer toe(s) (acquired), left foot: Secondary | ICD-10-CM

## 2018-02-25 DIAGNOSIS — E1142 Type 2 diabetes mellitus with diabetic polyneuropathy: Secondary | ICD-10-CM

## 2018-02-25 NOTE — Patient Instructions (Signed)
Follow up on ulcer 3rd left. Doing well. Lesion debrided and buttress pad placed. Return in 6 weeks.

## 2018-02-25 NOTE — Progress Notes (Signed)
Subjective: 72 y.o. year old female patient presents for follow up on 3rd toe left foot. Patient is using buddy wrapping with coban as instructed.  Objective: Dermatologic: Dry callus at distal end 3rd toe left foot with intradermal bleeding. Vascular: Pedal pulses are not palpable on left foot. Orthopedic: Contracted lesser digits with loss of EDL  Power left foot. Unable to dorsiflex lesser digits of left foot. Neurologic: Loss of protective sensory perception bilateral.  Assessment: Ulcer 3rd toe left. Contracted lesser digits 2-5 left.  Treatment: Pre ulcerative lesion debrided and buttress pad placed under 2nd and 3rd toe left foot.  Home care instruction given with supply.  Return in 6 weeks.

## 2018-02-26 DIAGNOSIS — E1122 Type 2 diabetes mellitus with diabetic chronic kidney disease: Secondary | ICD-10-CM | POA: Diagnosis not present

## 2018-02-26 DIAGNOSIS — J449 Chronic obstructive pulmonary disease, unspecified: Secondary | ICD-10-CM | POA: Diagnosis not present

## 2018-02-26 DIAGNOSIS — N183 Chronic kidney disease, stage 3 (moderate): Secondary | ICD-10-CM | POA: Diagnosis not present

## 2018-02-26 DIAGNOSIS — G600 Hereditary motor and sensory neuropathy: Secondary | ICD-10-CM | POA: Diagnosis not present

## 2018-02-26 DIAGNOSIS — E11610 Type 2 diabetes mellitus with diabetic neuropathic arthropathy: Secondary | ICD-10-CM | POA: Diagnosis not present

## 2018-02-26 DIAGNOSIS — I129 Hypertensive chronic kidney disease with stage 1 through stage 4 chronic kidney disease, or unspecified chronic kidney disease: Secondary | ICD-10-CM | POA: Diagnosis not present

## 2018-03-04 ENCOUNTER — Other Ambulatory Visit: Payer: Self-pay

## 2018-03-04 MED ORDER — ZOLPIDEM TARTRATE 5 MG PO TABS: 5.0000 mg | ORAL_TABLET | Freq: Every day | ORAL | 0 refills | Status: DC

## 2018-03-04 NOTE — Telephone Encounter (Signed)
Pt calls for medication refill on Zolpidem.   Last RX was sent 11-13-17 for #90 no refills.   Requesting RF go to [facility].  RX pended, please review and send if appropriate.   Sending request to [facility] since PCP out of office

## 2018-03-08 DIAGNOSIS — H353123 Nonexudative age-related macular degeneration, left eye, advanced atrophic without subfoveal involvement: Secondary | ICD-10-CM | POA: Diagnosis not present

## 2018-03-08 DIAGNOSIS — H31113 Age-related choroidal atrophy, bilateral: Secondary | ICD-10-CM | POA: Diagnosis not present

## 2018-03-08 DIAGNOSIS — H353211 Exudative age-related macular degeneration, right eye, with active choroidal neovascularization: Secondary | ICD-10-CM | POA: Diagnosis not present

## 2018-03-15 ENCOUNTER — Ambulatory Visit (INDEPENDENT_AMBULATORY_CARE_PROVIDER_SITE_OTHER): Payer: Medicare Other | Admitting: Family Medicine

## 2018-03-15 ENCOUNTER — Encounter: Payer: Self-pay | Admitting: Family Medicine

## 2018-03-15 VITALS — BP 139/55 | HR 44 | Ht 67.0 in | Wt 182.0 lb

## 2018-03-15 DIAGNOSIS — I10 Essential (primary) hypertension: Secondary | ICD-10-CM | POA: Diagnosis not present

## 2018-03-15 DIAGNOSIS — E1140 Type 2 diabetes mellitus with diabetic neuropathy, unspecified: Secondary | ICD-10-CM | POA: Diagnosis not present

## 2018-03-15 DIAGNOSIS — N1832 Chronic kidney disease, stage 3b: Secondary | ICD-10-CM

## 2018-03-15 DIAGNOSIS — N183 Chronic kidney disease, stage 3 (moderate): Secondary | ICD-10-CM

## 2018-03-15 DIAGNOSIS — J449 Chronic obstructive pulmonary disease, unspecified: Secondary | ICD-10-CM | POA: Diagnosis not present

## 2018-03-15 DIAGNOSIS — G6181 Chronic inflammatory demyelinating polyneuritis: Secondary | ICD-10-CM

## 2018-03-15 LAB — POCT GLYCOSYLATED HEMOGLOBIN (HGB A1C): Hemoglobin A1C: 5 % (ref 4.0–5.6)

## 2018-03-15 MED ORDER — INSULIN GLARGINE 100 UNIT/ML SOLOSTAR PEN: 10.0000 [IU] | PEN_INJECTOR | Freq: Every day | SUBCUTANEOUS | 1 refills | Status: DC

## 2018-03-15 MED ORDER — NITROGLYCERIN 0.4 MG SL SUBL: 0.4000 mg | SUBLINGUAL_TABLET | SUBLINGUAL | 1 refills | Status: DC | PRN

## 2018-03-15 MED ORDER — TIOTROPIUM BROMIDE MONOHYDRATE 18 MCG IN CAPS: 18.0000 ug | ORAL_CAPSULE | Freq: Every day | RESPIRATORY_TRACT | 3 refills | Status: DC

## 2018-03-15 NOTE — Progress Notes (Signed)
Subjective:    CC: DM  HPI:  Diabetes - no hypoglycemic events. No wounds or sores that are not healing well. No increased thirst or urination. Checking glucose at home. Taking medications as prescribed without any side effects.  Hypertension- Pt denies chest pain, SOB, dizziness, or heart palpitations.  Taking meds as directed w/o problems.  Denies medication side effects.    F/U COPD -doing well overall.  She is really just try to stay in side on the very hot humid poor air quality days.  She has not had any flares or exacerbations where she has had to seek medical care in the last 6 months.  Follow-up chronic inflammatory demyelinating polyneuropathy-she actually could feel the monofilament somewhat in her right foot today which is the first time in a long time.  She is now been getting IVIG treatments every 2 weeks and is also been doing some home exercises that she learned from physical therapy.  Past medical history, Surgical history, Family history not pertinant except as noted below, Social history, Allergies, and medications have been entered into the medical record, reviewed, and corrections made.   Review of Systems: No fevers, chills, night sweats, weight loss, chest pain, or shortness of breath.   Objective:    General: Well Developed, well nourished, and in no acute distress.  Neuro: Alert and oriented x3, extra-ocular muscles intact, sensation grossly intact.  HEENT: Normocephalic, atraumatic  Skin: Warm and dry, no rashes. Cardiac: Regular rate and rhythm, no murmurs rubs or gallops, no lower extremity edema.  Respiratory: Clear to auscultation bilaterally. Not using accessory muscles, speaking in full sentences.   Impression and Recommendations:    COPD -Stable.  No recent flares or exacerbations.  Continue Spiriva daily with as needed albuterol via nebulizer.  HTN - Well controlled. Continue current regimen. Follow up in  4 months.    DM - Well controlled. Continue  current regimen. Follow up in  3 months.  A1C is 5.0.  Back because the A1c is actually a little bit on the low side will actually have her decrease her insulin down to 10 units.  I will see her back in 3 months and if at that point everything looks great they were going to stop her insulin completely.  CIDP-doing IVIG every 2 weeks.  They are going to consider putting in a port because on port fortunately she is had poor venous access.

## 2018-03-30 ENCOUNTER — Other Ambulatory Visit: Payer: Self-pay | Admitting: Family Medicine

## 2018-04-01 ENCOUNTER — Ambulatory Visit: Payer: Medicare Other | Admitting: Podiatry

## 2018-04-22 DIAGNOSIS — Z79891 Long term (current) use of opiate analgesic: Secondary | ICD-10-CM | POA: Diagnosis not present

## 2018-04-22 DIAGNOSIS — M1990 Unspecified osteoarthritis, unspecified site: Secondary | ICD-10-CM | POA: Diagnosis not present

## 2018-04-22 DIAGNOSIS — M5136 Other intervertebral disc degeneration, lumbar region: Secondary | ICD-10-CM | POA: Diagnosis not present

## 2018-04-22 DIAGNOSIS — G894 Chronic pain syndrome: Secondary | ICD-10-CM | POA: Diagnosis not present

## 2018-04-22 DIAGNOSIS — M47816 Spondylosis without myelopathy or radiculopathy, lumbar region: Secondary | ICD-10-CM | POA: Diagnosis not present

## 2018-04-22 DIAGNOSIS — M25511 Pain in right shoulder: Secondary | ICD-10-CM | POA: Diagnosis not present

## 2018-05-09 ENCOUNTER — Ambulatory Visit (INDEPENDENT_AMBULATORY_CARE_PROVIDER_SITE_OTHER): Payer: Medicare Other | Admitting: Family Medicine

## 2018-05-09 ENCOUNTER — Encounter: Payer: Self-pay | Admitting: Family Medicine

## 2018-05-09 VITALS — BP 146/87 | HR 95 | Ht 67.0 in | Wt 177.0 lb

## 2018-05-09 DIAGNOSIS — E1140 Type 2 diabetes mellitus with diabetic neuropathy, unspecified: Secondary | ICD-10-CM | POA: Diagnosis not present

## 2018-05-09 DIAGNOSIS — K529 Noninfective gastroenteritis and colitis, unspecified: Secondary | ICD-10-CM | POA: Diagnosis not present

## 2018-05-09 DIAGNOSIS — Z23 Encounter for immunization: Secondary | ICD-10-CM | POA: Diagnosis not present

## 2018-05-09 DIAGNOSIS — I10 Essential (primary) hypertension: Secondary | ICD-10-CM | POA: Diagnosis not present

## 2018-05-09 DIAGNOSIS — G6181 Chronic inflammatory demyelinating polyneuritis: Secondary | ICD-10-CM | POA: Diagnosis not present

## 2018-05-09 DIAGNOSIS — R270 Ataxia, unspecified: Secondary | ICD-10-CM | POA: Diagnosis not present

## 2018-05-09 MED ORDER — METFORMIN HCL 500 MG PO TABS: 500.0000 mg | ORAL_TABLET | Freq: Two times a day (BID) | ORAL | 0 refills | Status: DC

## 2018-05-09 NOTE — Progress Notes (Signed)
Subjective:    Patient ID: [name], female    DOB: 1945/08/19, 72 y.o.   MRN: [id number]  HPI 72 year old female comes in today because of low blood pressure.  She is been getting IVIG infusions for her CIDP at home every 2 weeks for 2 days in a row each time.  BP was running in the 90s/40s.  This week she started drinking Gatorade and really try to push her fluids.  She has been having lots of diarrhea.  She had about 7 BMs this morning. Says the Immodium isn't working. She has had diarrhea for years. She is on metformin TID.  Her Diabetes has been well.  Take her Lomotil almost daily.    Diabetes - no hypoglycemic events. No wounds or sores that are not healing well. No increased thirst or urination. Checking glucose at home. Taking medications as prescribed without any side effects.   Review of Systems  BP (!) 146/87   Pulse 95   Ht '5\' 7"'$  (1.702 m)   Wt 177 lb (80.3 kg)   SpO2 95%   BMI 27.72 kg/m     Allergies  Allergen Reactions  . Albuterol Sulfate Palpitations  . Varenicline Nausea And Vomiting    Past Medical History:  Diagnosis Date  . Arthritis   . Charcot-Marie-Tooth disease   . COPD (chronic obstructive pulmonary disease) ([place])   . DDD (degenerative disc disease)    Lumbar and lumbosacral  . Depression   . Diabetes mellitus    type 2  . Diabetic peripheral neuropathy ([place])   . Facet syndrome, lumbar   . Gastric ulcer    w/o hemorrhage  . Hyperlipidemia   . Hypertension   . Insomnia   . Lumbosacral root lesions, not elsewhere classified   . Neurogenic bladder   . Obesity   . Osteopenia   . Other symptoms referable to back   . Post-menopausal   . PUD (peptic ulcer disease)   . 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 Spinal stenosis, lumbar region, without neurogenic claudication   . Thoracic spondylosis without myelopathy   . Thyroid disease    hypo  . Tobacco dependence   . Unspecified hereditary and idiopathic  peripheral neuropathy     Past Surgical History:  Procedure Laterality Date  . ANKLE RECONSTRUCTION     left ankle, plate and pins   . bilateral L3 medial branch block    . CHOLECYSTECTOMY    . fibroid tumor removal    . fluoroscopic L5 dorsal medial branch bloc    . RENAL ARTERY STENT  11/28/2010   left  . REPLACEMENT TOTAL KNEE      Social History   Socioeconomic History  . Marital status: Widowed    Spouse name: Not on file  . Number of children: Not on file  . Years of education: Not on file  . Highest education level: Not on file  Occupational History  . Not on file  Social Needs  . Financial resource strain: Not on file  . Food insecurity:    Worry: Not on file    Inability: Not on file  . Transportation needs:    Medical: Not on file    Non-medical: Not on file  Tobacco Use  . Smoking status: Current Every Day Smoker    Packs/day: 0.75    Years: 49.00    Pack years: 36.75    Types: Cigarettes  . Smokeless tobacco:  Never Used  . Tobacco comment: pt states she is going to try the E-cig  Substance and Sexual Activity  . Alcohol use: No    Alcohol/week: 0.0 standard drinks  . Drug use: No  . Sexual activity: Not on file  Lifestyle  . Physical activity:    Days per week: Not on file    Minutes per session: Not on file  . Stress: Not on file  Relationships  . Social connections:    Talks on phone: Not on file    Gets together: Not on file    Attends religious service: Not on file    Active member of club or organization: Not on file    Attends meetings of clubs or organizations: Not on file    Relationship status: Not on file  . Intimate partner violence:    Fear of current or ex partner: Not on file    Emotionally abused: Not on file    Physically abused: Not on file    Forced sexual activity: Not on file  Other Topics Concern  . Not on file  Social History Narrative  . Not on file    Family History  Problem Relation Age of Onset  . Stroke  Mother   . Heart disease Father 37       heart attack  . Hypertension Father   . Cancer Father        lung  . Diabetes Paternal Aunt        insulin dependent    Outpatient Encounter Medications as of 05/09/2018  Medication Sig  . gabapentin (NEURONTIN) 400 MG capsule TAKE ONE CAPSULE BY MOUTH FOUR TIMES DAILY  . albuterol (ACCUNEB) 1.25 MG/3ML nebulizer solution Take 3 mLs (1.25 mg total) by nebulization every 6 (six) hours as needed for wheezing or shortness of breath.  Marland Kitchen alendronate (FOSAMAX) 70 MG tablet TAKE 1 TAB EVERY 7 DAYS. TAKE IN THE MORNING WITH A FULL GLASS OF WATER ON AN EMPTY STOMACH. DO NOT INGEST ANY OTHER FOOD OR DRINK OR LIE D  . AMBULATORY NON FORMULARY MEDICATION Medication Name: order overnight pulse ox.  Dx COPD with SOB at night.  . AMBULATORY NON FORMULARY MEDICATION Medication Name: Auto Pap set to 5cm water pressure with 2 liter nocturnal oxygen as she desats as well. AHI 21.  After one week fax download form machine so we can bettter set her CPAP.  Fax to [facility].  . AMBULATORY NON FORMULARY MEDICATION Medication Name: Nebulizer machine with equipment.  Diagnosis COPD.  Patient is primarily homebound.  Is faxed to [facility]. She is an established patient.  Marland Kitchen aspirin EC 81 MG tablet Take 81 mg by mouth daily.  . blood glucose meter kit and supplies KIT Dispense based on patient and insurance preference. Use up to four times daily as directed Dx E11.40  . clopidogrel (PLAVIX) 75 MG tablet TAKE ONE TABLET BY MOUTH EVERY DAY  . dicyclomine (BENTYL) 20 MG tablet Take 1 tablet (20 mg total) by mouth 3 (three) times daily as needed for spasms.  . diphenoxylate-atropine (LOMOTIL) 2.5-0.025 MG tablet Take 1-2 tablets by mouth 2 (two) times daily as needed for diarrhea or loose stools.  Marland Kitchen FLUoxetine (PROZAC) 40 MG capsule Take 2 capsules (80 mg total) by mouth daily.  . furosemide (LASIX) 40 MG tablet TAKE 1 OR 2 TABLETS EVERY MORNING.  Marland Kitchen GAMUNEX-C 10 GM/100ML SOLN   .  HYDROcodone-acetaminophen (NORCO) 10-325 MG per tablet Take 1 tablet by mouth every 6 (  six) hours as needed. Do not take more that 5 tabs a day  . Insulin Glargine (LANTUS SOLOSTAR) 100 UNIT/ML Solostar Pen Inject 10-15 Units into the skin at bedtime.  . Insulin Pen Needle (PEN NEEDLES) 32G X 4 MM MISC Inject 1 each into the skin daily. For use when injecting insulin daily. Dx: V67.20,N47.0  . Lancets MISC by Does not apply route.    Marland Kitchen levothyroxine (SYNTHROID, LEVOTHROID) 100 MCG tablet Take 1 tablet (100 mcg total) by mouth daily before breakfast.  . lidocaine (XYLOCAINE) 5 % ointment Apply 1 application topically daily. 2 hours before skin procedure.  . metFORMIN (GLUCOPHAGE) 500 MG tablet Take 1 tablet (500 mg total) by mouth 2 (two) times daily with a meal.  . metoprolol tartrate (LOPRESSOR) 100 MG tablet TAKE 1 TABLET(100 MG TOTAL) BY MOUTH 2(TWO) TIMES DAILY.  . nitroGLYCERIN (NITROSTAT) 0.4 MG SL tablet Place 1 tablet (0.4 mg total) under the tongue every 5 (five) minutes as needed.  Marland Kitchen PRODIGY NO CODING BLOOD GLUC test strip USE AS DIRECTED  . simvastatin (ZOCOR) 40 MG tablet TAKE ONE TABLET pod AT SIX IN THE EVENING  . tiotropium (SPIRIVA HANDIHALER) 18 MCG inhalation capsule Place 1 capsule (18 mcg total) into inhaler and inhale daily.  Marland Kitchen zolpidem (AMBIEN) 5 MG tablet Take 1 tablet (5 mg total) by mouth at bedtime.  . [DISCONTINUED] gabapentin (NEURONTIN) 400 MG capsule TAKE ONE CAPSULE THREE TIMES DAILY AS NEEDED (Patient taking differently: 4 (four) times daily. TAKE ONE CAPSULE THREE TIMES DAILY AS NEEDED)  . [DISCONTINUED] glipiZIDE (GLUCOTROL) 5 MG tablet Take 1/2 tablet (2.5 mg total) by mouth daily before breakfast.  . [DISCONTINUED] metFORMIN (GLUCOPHAGE) 500 MG tablet Take 1 tablet (500 mg total) by mouth 3 (three) times daily.   No facility-administered encounter medications on file as of 05/09/2018.          Objective:   Physical Exam  Constitutional: She is oriented to  person, place, and time. She appears well-developed and well-nourished.  HENT:  Head: Normocephalic and atraumatic.  Cardiovascular: Normal rate, regular rhythm and normal heart sounds.  Pulmonary/Chest: Effort normal and breath sounds normal.  Neurological: She is alert and oriented to person, place, and time.  Skin: Skin is warm and dry.  Psychiatric: She has a normal mood and affect. Her behavior is normal.          Assessment & Plan:  Hypotension  -encourage her to push fluids.  Pressure today was much better and not low.  No adjustment made to her medications.  We will also work on trying to reduce the frequency of her diarrhea but will need to monitor carefully we may need to even consider adjusting her Lasix.  Diarrhea - will decrease her metformin to twice a day.  Call me in a couple weeks and let me know if that seems to be improving her diarrhea if not then please let me know.  DM - Stop glipizide and decrease metformin to BID.  Follow up in January for next hemoglobin A1c. Lab Results  Component Value Date   HGBA1C 5.0 03/15/2018

## 2018-05-09 NOTE — Patient Instructions (Addendum)
Stop your glipizide and stop your middle of the day dose of metformin.

## 2018-05-25 ENCOUNTER — Other Ambulatory Visit: Payer: Self-pay | Admitting: Family Medicine

## 2018-05-31 DIAGNOSIS — H353123 Nonexudative age-related macular degeneration, left eye, advanced atrophic without subfoveal involvement: Secondary | ICD-10-CM | POA: Diagnosis not present

## 2018-05-31 DIAGNOSIS — H31113 Age-related choroidal atrophy, bilateral: Secondary | ICD-10-CM | POA: Diagnosis not present

## 2018-05-31 DIAGNOSIS — H2513 Age-related nuclear cataract, bilateral: Secondary | ICD-10-CM | POA: Diagnosis not present

## 2018-05-31 DIAGNOSIS — H353211 Exudative age-related macular degeneration, right eye, with active choroidal neovascularization: Secondary | ICD-10-CM | POA: Diagnosis not present

## 2018-06-04 DIAGNOSIS — Z452 Encounter for adjustment and management of vascular access device: Secondary | ICD-10-CM | POA: Diagnosis not present

## 2018-06-04 DIAGNOSIS — E119 Type 2 diabetes mellitus without complications: Secondary | ICD-10-CM | POA: Diagnosis not present

## 2018-06-04 DIAGNOSIS — I10 Essential (primary) hypertension: Secondary | ICD-10-CM | POA: Diagnosis not present

## 2018-06-04 DIAGNOSIS — G6181 Chronic inflammatory demyelinating polyneuritis: Secondary | ICD-10-CM | POA: Diagnosis not present

## 2018-06-06 ENCOUNTER — Telehealth: Payer: Self-pay

## 2018-06-06 ENCOUNTER — Other Ambulatory Visit: Payer: Self-pay | Admitting: Physician Assistant

## 2018-06-06 MED ORDER — LIDOCAINE-PRILOCAINE 2.5-2.5 % EX CREA: 1.0000 "application " | TOPICAL_CREAM | CUTANEOUS | 0 refills | Status: DC | PRN

## 2018-06-06 NOTE — Telephone Encounter (Signed)
Pt had port put in yesterday and was advised to call her PCP and ask for EMLA Cream to be called in to [facility]

## 2018-06-06 NOTE — Telephone Encounter (Signed)
Prescription for EMLA cream sent to pharmacy.

## 2018-06-06 NOTE — Telephone Encounter (Signed)
Pt advised.

## 2018-06-14 ENCOUNTER — Ambulatory Visit: Payer: Medicare Other | Admitting: Family Medicine

## 2018-06-21 ENCOUNTER — Telehealth: Payer: Self-pay

## 2018-06-21 MED ORDER — AMBULATORY NON FORMULARY MEDICATION: 0 refills | Status: DC

## 2018-06-21 NOTE — Telephone Encounter (Signed)
[name] requests a full CPAP mask. Order faxed to [facility].

## 2018-06-21 NOTE — Telephone Encounter (Signed)
Agree with documentation as above.   Haddy Mullinax, MD  

## 2018-06-24 DIAGNOSIS — M1990 Unspecified osteoarthritis, unspecified site: Secondary | ICD-10-CM | POA: Diagnosis not present

## 2018-06-24 DIAGNOSIS — M5136 Other intervertebral disc degeneration, lumbar region: Secondary | ICD-10-CM | POA: Diagnosis not present

## 2018-06-24 DIAGNOSIS — G894 Chronic pain syndrome: Secondary | ICD-10-CM | POA: Diagnosis not present

## 2018-06-24 DIAGNOSIS — M47816 Spondylosis without myelopathy or radiculopathy, lumbar region: Secondary | ICD-10-CM | POA: Diagnosis not present

## 2018-06-24 DIAGNOSIS — Z79891 Long term (current) use of opiate analgesic: Secondary | ICD-10-CM | POA: Diagnosis not present

## 2018-06-24 DIAGNOSIS — M25511 Pain in right shoulder: Secondary | ICD-10-CM | POA: Diagnosis not present

## 2018-06-25 ENCOUNTER — Other Ambulatory Visit: Payer: Self-pay | Admitting: Family Medicine

## 2018-06-25 DIAGNOSIS — I10 Essential (primary) hypertension: Secondary | ICD-10-CM

## 2018-06-28 ENCOUNTER — Other Ambulatory Visit: Payer: Self-pay | Admitting: Family Medicine

## 2018-07-02 ENCOUNTER — Telehealth: Payer: Self-pay

## 2018-07-02 NOTE — Telephone Encounter (Signed)
Pt advised. She will try the Flonase. She has no transportation to make an appointment right now, she will call back.

## 2018-07-02 NOTE — Telephone Encounter (Signed)
She really needs to be seen in the office to have the ear evaluated.  If she is having some allergy symptoms she could try a nasal spray such as Flonase.  Sometimes allergies will create extra pressure on the ears.

## 2018-07-02 NOTE — Telephone Encounter (Signed)
[name] reports sharp ear pain for a few minutes daily. She reports this has been going on for sometime now. She is unable to come in for an appointment. Please advise.

## 2018-07-13 ENCOUNTER — Other Ambulatory Visit: Payer: Self-pay | Admitting: Family Medicine

## 2018-08-09 ENCOUNTER — Ambulatory Visit: Payer: Medicare Other | Admitting: Family Medicine

## 2018-08-15 ENCOUNTER — Other Ambulatory Visit: Payer: Self-pay | Admitting: Family Medicine

## 2018-08-21 DIAGNOSIS — H31113 Age-related choroidal atrophy, bilateral: Secondary | ICD-10-CM | POA: Diagnosis not present

## 2018-08-21 DIAGNOSIS — H353123 Nonexudative age-related macular degeneration, left eye, advanced atrophic without subfoveal involvement: Secondary | ICD-10-CM | POA: Diagnosis not present

## 2018-08-21 DIAGNOSIS — H353211 Exudative age-related macular degeneration, right eye, with active choroidal neovascularization: Secondary | ICD-10-CM | POA: Diagnosis not present

## 2018-09-11 DIAGNOSIS — R270 Ataxia, unspecified: Secondary | ICD-10-CM | POA: Diagnosis not present

## 2018-09-11 DIAGNOSIS — F321 Major depressive disorder, single episode, moderate: Secondary | ICD-10-CM | POA: Diagnosis not present

## 2018-09-11 DIAGNOSIS — G6181 Chronic inflammatory demyelinating polyneuritis: Secondary | ICD-10-CM | POA: Diagnosis not present

## 2018-09-16 ENCOUNTER — Other Ambulatory Visit: Payer: Self-pay | Admitting: Family Medicine

## 2018-09-16 DIAGNOSIS — M5136 Other intervertebral disc degeneration, lumbar region: Secondary | ICD-10-CM | POA: Diagnosis not present

## 2018-09-16 DIAGNOSIS — Z79891 Long term (current) use of opiate analgesic: Secondary | ICD-10-CM | POA: Diagnosis not present

## 2018-09-16 DIAGNOSIS — M47816 Spondylosis without myelopathy or radiculopathy, lumbar region: Secondary | ICD-10-CM | POA: Diagnosis not present

## 2018-09-16 DIAGNOSIS — M25511 Pain in right shoulder: Secondary | ICD-10-CM | POA: Diagnosis not present

## 2018-09-16 DIAGNOSIS — G894 Chronic pain syndrome: Secondary | ICD-10-CM | POA: Diagnosis not present

## 2018-09-16 DIAGNOSIS — M1990 Unspecified osteoarthritis, unspecified site: Secondary | ICD-10-CM | POA: Diagnosis not present

## 2018-09-23 ENCOUNTER — Ambulatory Visit: Payer: Medicare Other | Admitting: Family Medicine

## 2018-09-27 ENCOUNTER — Other Ambulatory Visit: Payer: Self-pay | Admitting: Family Medicine

## 2018-10-04 LAB — HM DIABETES EYE EXAM

## 2018-10-19 ENCOUNTER — Other Ambulatory Visit: Payer: Self-pay | Admitting: Family Medicine

## 2018-10-21 ENCOUNTER — Other Ambulatory Visit: Payer: Self-pay | Admitting: Family Medicine

## 2018-11-13 DIAGNOSIS — M1990 Unspecified osteoarthritis, unspecified site: Secondary | ICD-10-CM | POA: Diagnosis not present

## 2018-11-13 DIAGNOSIS — M25511 Pain in right shoulder: Secondary | ICD-10-CM | POA: Diagnosis not present

## 2018-11-13 DIAGNOSIS — G894 Chronic pain syndrome: Secondary | ICD-10-CM | POA: Diagnosis not present

## 2018-11-13 DIAGNOSIS — M5136 Other intervertebral disc degeneration, lumbar region: Secondary | ICD-10-CM | POA: Diagnosis not present

## 2018-11-13 DIAGNOSIS — M47816 Spondylosis without myelopathy or radiculopathy, lumbar region: Secondary | ICD-10-CM | POA: Diagnosis not present

## 2018-11-14 ENCOUNTER — Encounter: Payer: Self-pay | Admitting: Family Medicine

## 2018-11-14 ENCOUNTER — Ambulatory Visit (INDEPENDENT_AMBULATORY_CARE_PROVIDER_SITE_OTHER): Payer: Medicare Other | Admitting: Family Medicine

## 2018-11-14 ENCOUNTER — Other Ambulatory Visit: Payer: Self-pay

## 2018-11-14 DIAGNOSIS — N183 Chronic kidney disease, stage 3 (moderate): Secondary | ICD-10-CM | POA: Diagnosis not present

## 2018-11-14 DIAGNOSIS — N1832 Chronic kidney disease, stage 3b: Secondary | ICD-10-CM

## 2018-11-14 DIAGNOSIS — I10 Essential (primary) hypertension: Secondary | ICD-10-CM

## 2018-11-14 DIAGNOSIS — E039 Hypothyroidism, unspecified: Secondary | ICD-10-CM | POA: Diagnosis not present

## 2018-11-14 DIAGNOSIS — J449 Chronic obstructive pulmonary disease, unspecified: Secondary | ICD-10-CM | POA: Diagnosis not present

## 2018-11-14 DIAGNOSIS — E1140 Type 2 diabetes mellitus with diabetic neuropathy, unspecified: Secondary | ICD-10-CM | POA: Diagnosis not present

## 2018-11-14 DIAGNOSIS — M810 Age-related osteoporosis without current pathological fracture: Secondary | ICD-10-CM

## 2018-11-14 MED ORDER — INSULIN GLARGINE 100 UNIT/ML SOLOSTAR PEN: 10.0000 [IU] | PEN_INJECTOR | Freq: Every day | SUBCUTANEOUS | 1 refills | Status: DC

## 2018-11-14 MED ORDER — AMBULATORY NON FORMULARY MEDICATION: 0 refills | Status: DC

## 2018-11-14 MED ORDER — ESZOPICLONE 1 MG PO TABS: 1.0000 mg | ORAL_TABLET | Freq: Every evening | ORAL | 0 refills | Status: DC | PRN

## 2018-11-14 NOTE — Progress Notes (Signed)
Virtual Visit via Telephone Note  I connected with [name] on 11/14/18 at  1:20 PM EDT by telephone and verified that I am speaking with the correct person using two identifiers.   I discussed the limitations, risks, security and privacy concerns of performing an evaluation and management service by telephone and the availability of in person appointments. I also discussed with the patient that there may be a patient responsible charge related to this service. The patient expressed understanding and agreed to proceed.   Subjective:    CC: Diabetes and medication.   HPI: Diabetes - no hypoglycemic events. No wounds or sores that are not healing well. No increased thirst or urination. Checking glucose at home and they have been running a little higher lately. Says she went back up on her lantus from 10 units to 15 untis.  . Taking medications as prescribed without any side effects. BS=142, she is taking 15 U of Lantus, and 2 tablets of the Metformin daily.   F/U insomnia - She reports that the Ambien doesn't work.She said that it works for a few hours.  Hypertension- Pt denies chest pain, SOB, dizziness, or heart palpitations.  Taking meds as directed w/o problems.  Denies medication side effects.  She needs new batteries in her BP machine.  Says when nurse comes every 2 weeks her BP is normal.   OSA - Also she said that she never got her mask from [facility].(will reorder). She hasn't been wearing it since then.   F/U COPD - Pt stated that she doesn't have the Spiriva she couldn't afford/not covered by insurance. No recent flares.   She informed me that she had a friend who came to visit w/her on the 2nd and left the same day her granddaughter works in the hospital and was possibly exposed to COVID-19 and never got tested. Neither the  granddaughter or [name]'s friend have not been tested and she ([name]) has not been out of the house but to her front porch 1 day since this  happened.  Spoke w/pt about having mammogram she informed me that she quit getting these done.  F/U insomnia - only sleep for a few hours with the ambien and then wakes up on the cough and doesn't remember going to the cough.   Past medical history, Surgical history, Family history not pertinant except as noted below, Social history, Allergies, and medications have been entered into the medical record, reviewed, and corrections made.   Review of Systems: No fevers, chills, night sweats, weight loss, chest pain, or shortness of breath.   Objective:    General: Speaking clearly in complete sentences without any shortness of breath.  Alert and oriented x3.  Normal judgment. No apparent acute distress.    Impression and Recommendations:    DM - Ok to increase lantus by one unit per week until fasting sugar less than 130.    COPD - Stable. No recent flares. Spiriva too costly.   OSA - will order new mask. Had ordered in November.  Will call them.  Encouraged her to try to wear her CPAP even if for a few hours.   HTN - Overall well controlled when nurse visits every 2 units.    Insomnia  - Will try Lunesta in place of ambien. She will hold the amiben that she has left.   CKD 3 - Due to echeck renal function.        I discussed the assessment and treatment  plan with the patient. The patient was provided an opportunity to ask questions and all were answered. The patient agreed with the plan and demonstrated an understanding of the instructions.   The patient was advised to call back or seek an in-person evaluation if the symptoms worsen or if the condition fails to improve as anticipated.  I provided 24 minutes of non-face-to-face time during this encounter.   [name] Lecher, MD

## 2018-11-14 NOTE — Progress Notes (Signed)
BS=142, she is taking 15 U of Lantus, and 2 tablets of the Metformin daily.  She reports that the Ambien doesn't work.She said that it works for a few hours.  Also she said that she never got her mask from [facility].(will reorder)   Pt stated that she doesn't have the Spiriva she couldn't afford/not covered by insurance.  She informed me that she had a friend who came to visit w/her on the 2nd and left the same day her granddaughter works in the hospital and was possibly exposed to COVID-19 and never got tested. Neither the  granddaughter or Ms. Larocque's friend have not been tested and she (Ms. Magda) has not been out of the house but to her front porch 1 day since this happened.  Spoke w/pt about having mammogram she informed me that she quit getting these done.Marland KitchenMarland KitchenMarland KitchenElouise Munroe, [place]

## 2018-11-19 ENCOUNTER — Other Ambulatory Visit: Payer: Self-pay | Admitting: Family Medicine

## 2018-11-19 DIAGNOSIS — K591 Functional diarrhea: Secondary | ICD-10-CM

## 2018-11-26 ENCOUNTER — Other Ambulatory Visit: Payer: Self-pay | Admitting: Family Medicine

## 2018-12-06 ENCOUNTER — Ambulatory Visit (INDEPENDENT_AMBULATORY_CARE_PROVIDER_SITE_OTHER): Payer: Medicare Other | Admitting: Family Medicine

## 2018-12-06 ENCOUNTER — Encounter: Payer: Self-pay | Admitting: Family Medicine

## 2018-12-06 VITALS — BP 120/76 | Wt 180.0 lb

## 2018-12-06 DIAGNOSIS — E1140 Type 2 diabetes mellitus with diabetic neuropathy, unspecified: Secondary | ICD-10-CM

## 2018-12-06 DIAGNOSIS — G4700 Insomnia, unspecified: Secondary | ICD-10-CM

## 2018-12-06 NOTE — Progress Notes (Signed)
Subjective:    CC: not sleeping well  HPI:  F/U insomnia -was previously on Ambien but felt like it was not working very well just only getting a few hours of sleep so we discussed a trial of Lunesta. She can't fall asleep and if does fall asleep.   Diabetes - no hypoglycemic events. No wounds or sores that are not healing well. No increased thirst or urination. Checking glucose at home. Taking medications as prescribed without any side effects. Also encouraged her to increase her Lantus by 1 unit daily until fasting blood sugars were under 130. Found out she has run out of metformin for 3 weeks and that is why her sugars have been running high.   Past medical history, Surgical history, Family history not pertinant except as noted below, Social history, Allergies, and medications have been entered into the medical record, reviewed, and corrections made.   Review of Systems: No fevers, chills, night sweats, weight loss, chest pain, or shortness of breath.   Objective:    General: Well Developed, well nourished, and in no acute distress.  Neuro: Alert and oriented x3, extra-ocular muscles intact, sensation grossly intact.  HEENT: Normocephalic, atraumatic  Skin: Warm and dry, no rashes. Cardiac: Regular rate and rhythm, no murmurs rubs or gallops, no lower extremity edema.  Respiratory: Clear to auscultation bilaterally. Not using accessory muscles, speaking in full sentences.   Impression and Recommendations:   Insomnia - ok to increase to 2 mg and if not effective can increase to 3 mg.  I explained to her that that is the max dose and not to go beyond that.  If the 2 or 3 mg dose is effective then please call me back and we can send over prescription to the pharmacy for the increased dosage.  Continue to avoid caffeine. She has a cup in the morning.     DM - has restarted her metformin.  Hopefully this will bring her sugars back down without having to go up on her insulin but just  reminded her that she can go up 1 unit every other day until her sugars are under 130 if needed.

## 2018-12-10 ENCOUNTER — Telehealth: Payer: Self-pay

## 2018-12-10 ENCOUNTER — Other Ambulatory Visit: Payer: Self-pay | Admitting: *Deleted

## 2018-12-10 ENCOUNTER — Other Ambulatory Visit: Payer: Self-pay | Admitting: Family Medicine

## 2018-12-10 DIAGNOSIS — K591 Functional diarrhea: Secondary | ICD-10-CM

## 2018-12-10 MED ORDER — DIPHENOXYLATE-ATROPINE 2.5-0.025 MG PO TABS: ORAL_TABLET | ORAL | 5 refills | Status: DC

## 2018-12-10 NOTE — Telephone Encounter (Signed)
[name] called and states the [name] is not helping with her insomnia. She would like to go back on the Ambien. Please advise.

## 2018-12-11 MED ORDER — ZOLPIDEM TARTRATE 5 MG PO TABS: 5.0000 mg | ORAL_TABLET | Freq: Every day | ORAL | 0 refills | Status: DC

## 2018-12-11 NOTE — Telephone Encounter (Signed)
Left VM with status update.  

## 2018-12-11 NOTE — Telephone Encounter (Signed)
OK, new rx sent for Ambien.

## 2019-01-02 ENCOUNTER — Other Ambulatory Visit: Payer: Self-pay | Admitting: Family Medicine

## 2019-01-02 NOTE — Telephone Encounter (Signed)
Called pt and advised that this medication can't be refilled until we get updated labs to check her kidney function. She voiced understanding.Maryruth Eve, [name], CMA

## 2019-01-07 ENCOUNTER — Other Ambulatory Visit: Payer: Self-pay | Admitting: Family Medicine

## 2019-01-07 DIAGNOSIS — I10 Essential (primary) hypertension: Secondary | ICD-10-CM

## 2019-01-10 DIAGNOSIS — E039 Hypothyroidism, unspecified: Secondary | ICD-10-CM | POA: Diagnosis not present

## 2019-01-10 DIAGNOSIS — M810 Age-related osteoporosis without current pathological fracture: Secondary | ICD-10-CM | POA: Diagnosis not present

## 2019-01-10 DIAGNOSIS — E1140 Type 2 diabetes mellitus with diabetic neuropathy, unspecified: Secondary | ICD-10-CM | POA: Diagnosis not present

## 2019-01-10 DIAGNOSIS — N183 Chronic kidney disease, stage 3 (moderate): Secondary | ICD-10-CM | POA: Diagnosis not present

## 2019-01-11 LAB — COMPLETE METABOLIC PANEL WITH GFR
AG Ratio: 0.9 (calc) — ABNORMAL LOW (ref 1.0–2.5)
ALT: 26 U/L (ref 6–29)
AST: 28 U/L (ref 10–35)
Albumin: 3.7 g/dL (ref 3.6–5.1)
Alkaline phosphatase (APISO): 57 U/L (ref 37–153)
BUN/Creatinine Ratio: 12 (calc) (ref 6–22)
BUN: 12 mg/dL (ref 7–25)
CO2: 23 mmol/L (ref 20–32)
Calcium: 9.3 mg/dL (ref 8.6–10.4)
Chloride: 100 mmol/L (ref 98–110)
Creat: 0.99 mg/dL — ABNORMAL HIGH (ref 0.60–0.93)
GFR, Est African American: 65 mL/min/{1.73_m2} (ref >=60)
GFR, Est Non African American: 56 mL/min/{1.73_m2} — ABNORMAL LOW (ref >=60)
Globulin: 4.1 g/dL (calc) — ABNORMAL HIGH (ref 1.9–3.7)
Glucose, Bld: 119 mg/dL — ABNORMAL HIGH (ref 65–99)
Potassium: 4.6 mmol/L (ref 3.5–5.3)
Sodium: 134 mmol/L — ABNORMAL LOW (ref 135–146)
Total Bilirubin: 1 mg/dL (ref 0.2–1.2)
Total Protein: 7.8 g/dL (ref 6.1–8.1)

## 2019-01-11 LAB — VITAMIN D 25 HYDROXY (VIT D DEFICIENCY, FRACTURES): Vit D, 25-Hydroxy: 28 ng/mL — ABNORMAL LOW (ref 30–100)

## 2019-01-11 LAB — HEMOGLOBIN A1C
Hgb A1c MFr Bld: 5 % of total Hgb (ref <5.7)
Mean Plasma Glucose: 97 (calc)
eAG (mmol/L): 5.4 (calc)

## 2019-01-11 LAB — TSH: TSH: 6.88 mIU/L — ABNORMAL HIGH (ref 0.40–4.50)

## 2019-01-14 ENCOUNTER — Other Ambulatory Visit: Payer: Self-pay | Admitting: Family Medicine

## 2019-01-14 ENCOUNTER — Other Ambulatory Visit: Payer: Self-pay | Admitting: *Deleted

## 2019-01-14 MED ORDER — FUROSEMIDE 40 MG PO TABS: ORAL_TABLET | ORAL | 1 refills | Status: DC

## 2019-01-14 MED ORDER — LEVOTHYROXINE SODIUM 112 MCG PO TABS: 112.0000 ug | ORAL_TABLET | Freq: Every day | ORAL | 0 refills | Status: DC

## 2019-01-21 ENCOUNTER — Other Ambulatory Visit: Payer: Self-pay | Admitting: Family Medicine

## 2019-02-23 ENCOUNTER — Other Ambulatory Visit: Payer: Self-pay | Admitting: Family Medicine

## 2019-03-04 ENCOUNTER — Other Ambulatory Visit: Payer: Self-pay | Admitting: Family Medicine

## 2019-03-04 DIAGNOSIS — Z794 Long term (current) use of insulin: Secondary | ICD-10-CM

## 2019-03-04 DIAGNOSIS — E0840 Diabetes mellitus due to underlying condition with diabetic neuropathy, unspecified: Secondary | ICD-10-CM

## 2019-03-07 ENCOUNTER — Other Ambulatory Visit: Payer: Self-pay | Admitting: Family Medicine

## 2019-03-14 ENCOUNTER — Other Ambulatory Visit: Payer: Self-pay | Admitting: Family Medicine

## 2019-04-03 ENCOUNTER — Other Ambulatory Visit: Payer: Self-pay | Admitting: Family Medicine

## 2019-04-03 DIAGNOSIS — I10 Essential (primary) hypertension: Secondary | ICD-10-CM

## 2019-04-04 ENCOUNTER — Other Ambulatory Visit: Payer: Self-pay | Admitting: Family Medicine

## 2019-04-11 DIAGNOSIS — R270 Ataxia, unspecified: Secondary | ICD-10-CM | POA: Diagnosis not present

## 2019-04-11 DIAGNOSIS — G6181 Chronic inflammatory demyelinating polyneuritis: Secondary | ICD-10-CM | POA: Diagnosis not present

## 2019-04-15 ENCOUNTER — Other Ambulatory Visit: Payer: Self-pay | Admitting: Family Medicine

## 2019-04-16 DIAGNOSIS — M47816 Spondylosis without myelopathy or radiculopathy, lumbar region: Secondary | ICD-10-CM | POA: Diagnosis not present

## 2019-04-16 DIAGNOSIS — Z79891 Long term (current) use of opiate analgesic: Secondary | ICD-10-CM | POA: Diagnosis not present

## 2019-04-16 DIAGNOSIS — M1990 Unspecified osteoarthritis, unspecified site: Secondary | ICD-10-CM | POA: Diagnosis not present

## 2019-04-16 DIAGNOSIS — M5136 Other intervertebral disc degeneration, lumbar region: Secondary | ICD-10-CM | POA: Diagnosis not present

## 2019-04-16 DIAGNOSIS — M25511 Pain in right shoulder: Secondary | ICD-10-CM | POA: Diagnosis not present

## 2019-04-16 DIAGNOSIS — G894 Chronic pain syndrome: Secondary | ICD-10-CM | POA: Diagnosis not present

## 2019-04-24 ENCOUNTER — Ambulatory Visit: Payer: Medicare Other | Admitting: Family Medicine

## 2019-04-26 ENCOUNTER — Other Ambulatory Visit: Payer: Self-pay | Admitting: Family Medicine

## 2019-05-02 ENCOUNTER — Other Ambulatory Visit: Payer: Self-pay

## 2019-05-02 ENCOUNTER — Encounter: Payer: Self-pay | Admitting: Family Medicine

## 2019-05-02 ENCOUNTER — Ambulatory Visit (INDEPENDENT_AMBULATORY_CARE_PROVIDER_SITE_OTHER): Payer: Medicare Other | Admitting: Family Medicine

## 2019-05-02 VITALS — BP 142/69 | HR 45 | Ht 67.0 in | Wt 187.0 lb

## 2019-05-02 DIAGNOSIS — E559 Vitamin D deficiency, unspecified: Secondary | ICD-10-CM

## 2019-05-02 DIAGNOSIS — J449 Chronic obstructive pulmonary disease, unspecified: Secondary | ICD-10-CM | POA: Diagnosis not present

## 2019-05-02 DIAGNOSIS — F17200 Nicotine dependence, unspecified, uncomplicated: Secondary | ICD-10-CM | POA: Diagnosis not present

## 2019-05-02 DIAGNOSIS — I10 Essential (primary) hypertension: Secondary | ICD-10-CM

## 2019-05-02 DIAGNOSIS — E039 Hypothyroidism, unspecified: Secondary | ICD-10-CM | POA: Diagnosis not present

## 2019-05-02 DIAGNOSIS — Z23 Encounter for immunization: Secondary | ICD-10-CM

## 2019-05-02 DIAGNOSIS — G4733 Obstructive sleep apnea (adult) (pediatric): Secondary | ICD-10-CM | POA: Diagnosis not present

## 2019-05-02 DIAGNOSIS — E1140 Type 2 diabetes mellitus with diabetic neuropathy, unspecified: Secondary | ICD-10-CM | POA: Diagnosis not present

## 2019-05-02 DIAGNOSIS — M5137 Other intervertebral disc degeneration, lumbosacral region: Secondary | ICD-10-CM | POA: Diagnosis not present

## 2019-05-02 LAB — POCT GLYCOSYLATED HEMOGLOBIN (HGB A1C): Hemoglobin A1C: 5.3 % (ref 4.0–5.6)

## 2019-05-02 MED ORDER — BUPROPION HCL ER (XL) 150 MG PO TB24: 150.0000 mg | ORAL_TABLET | ORAL | 2 refills | Status: DC

## 2019-05-02 MED ORDER — TRELEGY ELLIPTA 100-62.5-25 MCG/INH IN AEPB: 1.0000 | INHALATION_SPRAY | RESPIRATORY_TRACT | 5 refills | Status: DC

## 2019-05-02 MED ORDER — AMBULATORY NON FORMULARY MEDICATION: 0 refills | Status: DC

## 2019-05-02 MED ORDER — LEVALBUTEROL HCL 0.63 MG/3ML IN NEBU: 0.6300 mg | INHALATION_SOLUTION | Freq: Three times a day (TID) | RESPIRATORY_TRACT | 3 refills | Status: DC | PRN

## 2019-05-02 NOTE — Assessment & Plan Note (Signed)
A1c looks fantastic today at 5.3.  Follow-up in 4 months.

## 2019-05-02 NOTE — Progress Notes (Signed)
Established Patient Office Visit  Subjective:  Patient ID: [name], female    DOB: [birth year]  Age: 73 y.o. MRN: [id number]  CC:  Chief Complaint  Patient presents with  . Diabetes  . Hypothyroidism    HPI [name] presents for   Feeling like her "head sloshes around" in the mornings. It lasts until she drinks her cup of coffee.  She feels like her head is "big" and then when she turns it she just feels like there is fluid sloshing around in her head.  She says it comes and goes.  She will sometimes have multiple days in a row where she will notice it and then sometimes she will have a week where she really does not notice it.  She denies any vertigo symptoms or feeling like she is going to pass out.  She does occasionally get some plugging in her ears and then sometimes some crackling.  She has not had any major sinus congestion.  She does have a history of frequent sinus infections.  She does report that she has not been wearing her CPAP recently though she did actually wear it last night and says she woke up without symptoms this morning.  She has not been able to find her home blood pressure cuff so has not been able to check her blood pressure first thing in the morning.  Hypothyroidism - Taking medication regularly in the AM away from food and vitamins, etc. No recent change to skin, hair, or energy levels.  Her last TSH was elevated in June.  We increased her levothyroxine from 100 mcg daily to 112 mcg daily.  She is due to recheck her labs today.  She reports that she also needs refills on her medication.  Diabetes - no hypoglycemic events. No wounds or sores that are not healing well. No increased thirst or urination. Checking glucose at home. Taking medications as prescribed without any side effects. Last seen 4 mo ago and she restarted her metformin.    Hypertension- Pt denies chest pain, SOB, dizziness, or heart palpitations.  Taking meds as directed w/o problems.   Denies medication side effects.    F/U COPD-she has not had any recent flares or exacerbations.  She says she is out of her Spiriva and is worried about being able to afford it.  She also says the albuterol makes her heart race too much so she does not like to use it.  Past Medical History:  Diagnosis Date  . Arthritis   . Charcot-Marie-Tooth disease   . COPD (chronic obstructive pulmonary disease) ([place])   . DDD (degenerative disc disease)    Lumbar and lumbosacral  . Depression   . Diabetes mellitus    type 2  . Diabetic peripheral neuropathy ([place])   . Facet syndrome, lumbar   . Gastric ulcer    w/o hemorrhage  . Hyperlipidemia   . Hypertension   . Insomnia   . Lumbosacral root lesions, not elsewhere classified   . Neurogenic bladder   . Obesity   . Osteopenia   . Other symptoms referable to back   . Post-menopausal   . PUD (peptic ulcer disease)   . 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 Spinal stenosis, lumbar region, without neurogenic claudication   . Thoracic spondylosis without myelopathy   . Thyroid disease    hypo  . Tobacco dependence   . Unspecified hereditary and idiopathic peripheral neuropathy  Past Surgical History:  Procedure Laterality Date  . ANKLE RECONSTRUCTION     left ankle, plate and pins   . bilateral L3 medial branch block    . CHOLECYSTECTOMY    . fibroid tumor removal    . fluoroscopic L5 dorsal medial branch bloc    . RENAL ARTERY STENT  11/28/2010   left  . REPLACEMENT TOTAL KNEE      Family History  Problem Relation Age of Onset  . Stroke Mother   . Heart disease Father 72       heart attack  . Hypertension Father   . Cancer Father        lung  . Diabetes Paternal Aunt        insulin dependent    Social History   Socioeconomic History  . Marital status: Widowed    Spouse name: Not on file  . Number of children: Not on file  . Years of education: Not on file  . Highest education level: Not on file   Occupational History  . Not on file  Social Needs  . Financial resource strain: Not on file  . Food insecurity    Worry: Not on file    Inability: Not on file  . Transportation needs    Medical: Not on file    Non-medical: Not on file  Tobacco Use  . Smoking status: Current Every Day Smoker    Packs/day: 0.75    Years: 49.00    Pack years: 36.75    Types: Cigarettes  . Smokeless tobacco: Never Used  . Tobacco comment: pt states she is going to try the E-cig  Substance and Sexual Activity  . Alcohol use: No    Alcohol/week: 0.0 standard drinks  . Drug use: No  . Sexual activity: Not on file  Lifestyle  . Physical activity    Days per week: Not on file    Minutes per session: Not on file  . Stress: Not on file  Relationships  . Social Herbalist on phone: Not on file    Gets together: Not on file    Attends religious service: Not on file    Active member of club or organization: Not on file    Attends meetings of clubs or organizations: Not on file    Relationship status: Not on file  . Intimate partner violence    Fear of current or ex partner: Not on file    Emotionally abused: Not on file    Physically abused: Not on file    Forced sexual activity: Not on file  Other Topics Concern  . Not on file  Social History Narrative  . Not on file    Outpatient Medications Prior to Visit  Medication Sig Dispense Refill  . alendronate (FOSAMAX) 70 MG tablet TAKE 1 TAB EVERY 7 DAYS. TAKE IN THE MORNING WITH A FULL GLASS OF WATER ON AN EMPTY STOMACH. DO NOT TAKE OTHER FOOD OR DRINK OR LIE DOWN 12 tablet 1  . AMBULATORY NON FORMULARY MEDICATION Medication Name: Auto Pap set to 5cm water pressure with 2 liter nocturnal oxygen as she desats as well. AHI 21.  After one week fax download form machine so we can bettter set her CPAP.  Fax to [facility]. 1 vial 0  . AMBULATORY NON FORMULARY MEDICATION Medication Name: Nebulizer machine with equipment.  Diagnosis COPD.  Patient  is primarily homebound.  Is faxed to [facility]. She is an established patient. 1  Units 0  . aspirin EC 81 MG tablet Take 81 mg by mouth daily.    . blood glucose meter kit and supplies KIT Dispense based on patient and insurance preference. Use up to four times daily as directed Dx E11.40 1 each 0  . clopidogrel (PLAVIX) 75 MG tablet TAKE ONE TABLET BY MOUTH EVERY DAY 30 tablet 11  . dicyclomine (BENTYL) 20 MG tablet TAKE ONE TABLET BY MOUTH THREE TIMES DAILY AS NEEDED FOR SPASMS 90 tablet 2  . diphenoxylate-atropine (LOMOTIL) 2.5-0.025 MG tablet TAKE 1 OR 2 TABLETS BY MOUTH TWICE DAILY AS NEEDED FOR DIARRHEA OR LOOSE STOOLS 60 tablet 5  . FLUoxetine (PROZAC) 40 MG capsule Take 2 capsules (80 mg total) by mouth daily. 180 capsule 2  . furosemide (LASIX) 40 MG tablet Take 1 or 2 tablets by mouth every morning. 90 tablet 1  . gabapentin (NEURONTIN) 400 MG capsule TAKE ONE CAPSULE BY MOUTH FOUR TIMES DAILY    . GAMUNEX-C 10 GM/100ML SOLN     . GAMUNEX-C 20 GM/200ML SOLN     . HYDROcodone-acetaminophen (NORCO) 10-325 MG per tablet Take 1 tablet by mouth every 6 (six) hours as needed. Do not take more that 5 tabs a day    . Insulin Glargine (LANTUS SOLOSTAR) 100 UNIT/ML Solostar Pen Inject 10-15 Units into the skin at bedtime. 15 mL 1  . Lancets MISC by Does not apply route.      Marland Kitchen levothyroxine (SYNTHROID) 112 MCG tablet Take 1 tablet (112 mcg total) by mouth daily. 90 tablet 0  . lidocaine (XYLOCAINE) 5 % ointment Apply 1 application topically daily. 2 hours before skin procedure. 50 g 11  . lidocaine-prilocaine (EMLA) cream Apply 1 application topically as needed. 30 g 0  . metFORMIN (GLUCOPHAGE) 500 MG tablet TAKE ONE TABLET BY MOUTH THREE TIMES DAILY 270 tablet 1  . metoprolol tartrate (LOPRESSOR) 100 MG tablet TAKE 1 TABLET(100 MG TOTAL) BY MOUTH 2(TWO) TIMES DAILY. 180 tablet 1  . nitroGLYCERIN (NITROSTAT) 0.4 MG SL tablet Place 1 tablet (0.4 mg total) under the tongue every 5 (five) minutes as  needed. 10 tablet 1  . PRODIGY NO CODING BLOOD GLUC test strip USE AS DIRECTED 100 each 0  . simvastatin (ZOCOR) 40 MG tablet TAKE ONE TABLET BY MOUTH EVERY DAY AT SIX IN THE EVENING 90 tablet 3  . UNIFINE PENTIPS 32G X 4 MM MISC USE WHEN INJECTING INSULIN DAILY 100 each 6  . zolpidem (AMBIEN) 5 MG tablet Take 1 tablet (5 mg total) by mouth at bedtime. 90 tablet 0  . albuterol (ACCUNEB) 1.25 MG/3ML nebulizer solution Take 3 mLs (1.25 mg total) by nebulization every 6 (six) hours as needed for wheezing or shortness of breath. 75 mL 12  . AMBULATORY NON FORMULARY MEDICATION Medication Name: order overnight pulse ox.  Dx COPD with SOB at night. 1 vial 0  . AMBULATORY NON FORMULARY MEDICATION CPAP Full Mask - Dx Sleep Apnea - Fax to [facility] - [phone] 1 each 0   No facility-administered medications prior to visit.     Allergies  Allergen Reactions  . Varenicline Nausea And Vomiting    ROS Review of Systems    Objective:    Physical Exam  Constitutional: She is oriented to person, place, and time. She appears well-developed and well-nourished.  HENT:  Head: Normocephalic and atraumatic.  Right Ear: External ear normal.  Left Ear: External ear normal.  Nose: Nose normal.  Mouth/Throat: Oropharynx is clear and moist.  TMs and canals are clear.   Eyes: Pupils are equal, round, and reactive to light. Conjunctivae and EOM are normal.  Neck: Neck supple. No thyromegaly present.  Cardiovascular: Normal rate, regular rhythm and normal heart sounds.  Pulmonary/Chest: Effort normal and breath sounds normal. She has no wheezes.  Lymphadenopathy:    She has no cervical adenopathy.  Neurological: She is alert and oriented to person, place, and time.  Skin: Skin is warm and dry.  Psychiatric: She has a normal mood and affect. Her behavior is normal.    BP (!) 142/69   Pulse (!) 45   Ht '5\' 7"'$  (1.702 m)   Wt 187 lb (84.8 kg)   SpO2 97%   BMI 29.29 kg/m  Wt Readings from Last 3  Encounters:  05/02/19 187 lb (84.8 kg)  12/06/18 180 lb (81.6 kg)  05/09/18 177 lb (80.3 kg)     Health Maintenance Due  Topic Date Due  . URINE MICROALBUMIN  12/05/2017  . INFLUENZA VACCINE  03/08/2019  . FOOT EXAM  03/16/2019    There are no preventive care reminders to display for this patient.  Lab Results  Component Value Date   TSH 6.88 (H) 01/10/2019   Lab Results  Component Value Date   WBC 7.5 11/13/2017   HGB 13.5 11/13/2017   HCT 39.3 11/13/2017   MCV 94.2 11/13/2017   PLT 274 11/13/2017   Lab Results  Component Value Date   NA 134 (L) 01/10/2019   K 4.6 01/10/2019   CO2 23 01/10/2019   GLUCOSE 119 (H) 01/10/2019   BUN 12 01/10/2019   CREATININE 0.99 (H) 01/10/2019   BILITOT 1.0 01/10/2019   ALKPHOS 51 09/01/2015   AST 28 01/10/2019   ALT 26 01/10/2019   PROT 7.8 01/10/2019   ALBUMIN 3.4 (L) 09/01/2015   CALCIUM 9.3 01/10/2019   Lab Results  Component Value Date   CHOL 128 09/01/2015   Lab Results  Component Value Date   HDL 42 (L) 09/01/2015   Lab Results  Component Value Date   LDLCALC 61 09/01/2015   Lab Results  Component Value Date   TRIG 124 09/01/2015   Lab Results  Component Value Date   CHOLHDL 3.0 09/01/2015   Lab Results  Component Value Date   HGBA1C 5.3 05/02/2019      Assessment & Plan:   Problem List Items Addressed This Visit      Cardiovascular and Mediastinum   HYPERTENSION, BENIGN SYSTEMIC - Primary    Normally well controlled.  I would like for her to consider testing her blood pressure in the mornings when she has the abnormal head sensation just to rule that out.  Labs are up-to-date from June will be due for repeat in December.      Relevant Orders   TSH + free T4   CBC   POCT glycosylated hemoglobin (Hb A1C) (Completed)   VITAMIN D 25 Hydroxy (Vit-D Deficiency, Fractures)     Respiratory   OSA (obstructive sleep apnea)    Urged her to wear her CPAP regularly.      COPD, group D, by GOLD 2017  classification ([place])    Stable.  No recent flares.  She says she is out of Spiriva.  And says that the rescue inhaler makes her heart race too much.  Discussed that we could try to see if they will cover Xopenex.  At also like for her to be on a combination inhaler so we will see  if insurance will cover Trelegy instead of Spiriva.  If she ends up eating her Medicare gap which we might be able to get her to qualify for patient assistance.      Relevant Medications   levalbuterol (XOPENEX) 0.63 MG/3ML nebulizer solution   Fluticasone-Umeclidin-Vilant (TRELEGY ELLIPTA) 100-62.5-25 MCG/INH AEPB     Endocrine   Hypothyroidism    Due to recheck TSH so that we can make adjustments to her regimen.      Relevant Orders   TSH + free T4   CBC   POCT glycosylated hemoglobin (Hb A1C) (Completed)   VITAMIN D 25 Hydroxy (Vit-D Deficiency, Fractures)   Diabetes mellitus with neuropathy ([facility])    A1c looks fantastic today at 5.3.  Follow-up in 4 months.      Relevant Orders   TSH + free T4   CBC   POCT glycosylated hemoglobin (Hb A1C) (Completed)   VITAMIN D 25 Hydroxy (Vit-D Deficiency, Fractures)     Musculoskeletal and Integument   DEGENERATION, LUMBAR/LUMBOSACRAL DISC   Relevant Medications   AMBULATORY NON FORMULARY MEDICATION     Other   TOBACCO DEPENDENCE    Other Visit Diagnoses    Vitamin D deficiency       Relevant Orders   VITAMIN D 25 Hydroxy (Vit-D Deficiency, Fractures)   Need for immunization against influenza       Relevant Orders   Flu Vaccine QUAD High Dose(Fluad) (Completed)     Abnormal head sensation-I actually think that it is likely related to her not using her CPAP it might even be related to CO2 buildup so just encouraged her to start using it more regularly to see if this makes a difference.  Also encouraged her to try to check her blood pressure in the morning to see if maybe it is running a little bit low or high and if that could could be contributing.  Ear  exam is normal today I do not see any indication of acute sinus infection but certainly chronic sinusitis could be on the differential.    Meds ordered this encounter  Medications  . buPROPion (WELLBUTRIN XL) 150 MG 24 hr tablet    Sig: Take 1 tablet (150 mg total) by mouth every morning.    Dispense:  30 tablet    Refill:  2  . DISCONTD: Fluticasone-Umeclidin-Vilant (TRELEGY ELLIPTA) 100-62.5-25 MCG/INH AEPB    Sig: Inhale 1 puff into the lungs every morning.    Dispense:  30 each    Refill:  5  . levalbuterol (XOPENEX) 0.63 MG/3ML nebulizer solution    Sig: Take 3 mLs (0.63 mg total) by nebulization every 8 (eight) hours as needed for wheezing or shortness of breath.    Dispense:  60 mL    Refill:  3  . Fluticasone-Umeclidin-Vilant (TRELEGY ELLIPTA) 100-62.5-25 MCG/INH AEPB    Sig: Inhale 1 puff into the lungs every morning.    Dispense:  60 each    Refill:  5  . AMBULATORY NON FORMULARY MEDICATION    Sig: Medication Name: Rollator  Fax: [phone]    Dispense:  1 each    Refill:  0    Follow-up: Return in about 3 months (around 08/01/2019) for Diabetes follow-up.   Time spent 40 min, > 50% of time spent face to face in encounter for abnormal head sensation, diabetes, hypothyroidism, COPD, sleep apnea.  [name], MD

## 2019-05-02 NOTE — Assessment & Plan Note (Signed)
Due to recheck TSH so that we can make adjustments to her regimen.

## 2019-05-02 NOTE — Assessment & Plan Note (Signed)
Urged her to wear her CPAP regularly.

## 2019-05-02 NOTE — Assessment & Plan Note (Addendum)
Stable.  No recent flares.  She says she is out of Spiriva.  And says that the rescue inhaler makes her heart race too much.  Discussed that we could try to see if they will cover Xopenex.  At also like for her to be on a combination inhaler so we will see if insurance will cover Trelegy instead of Spiriva.  If she ends up eating her Medicare gap which we might be able to get her to qualify for patient assistance.

## 2019-05-02 NOTE — Assessment & Plan Note (Signed)
Normally well controlled.  I would like for her to consider testing her blood pressure in the mornings when she has the abnormal head sensation just to rule that out.  Labs are up-to-date from June will be due for repeat in December.

## 2019-05-02 NOTE — Patient Instructions (Signed)
We will add Wellbutrin to your regimen to see if this helps with your mood.  Give it at least 6 weeks to see if it is helping. You can go to the lab to have your thyroid checked today so that we can make adjustments if needed to your regimen.

## 2019-05-03 LAB — CBC
HCT: 40.5 % (ref 35.0–45.0)
Hemoglobin: 13.6 g/dL (ref 11.7–15.5)
MCH: 33 pg (ref 27.0–33.0)
MCHC: 33.6 g/dL (ref 32.0–36.0)
MCV: 98.3 fL (ref 80.0–100.0)
MPV: 11.7 fL (ref 7.5–12.5)
Platelets: 278 10*3/uL (ref 140–400)
RBC: 4.12 10*6/uL (ref 3.80–5.10)
RDW: 13 % (ref 11.0–15.0)
WBC: 7.1 10*3/uL (ref 3.8–10.8)

## 2019-05-03 LAB — TSH+FREE T4: TSH W/REFLEX TO FT4: 22.28 mIU/L — ABNORMAL HIGH (ref 0.40–4.50)

## 2019-05-03 LAB — T4, FREE: Free T4: 0.8 ng/dL (ref 0.8–1.8)

## 2019-05-03 LAB — VITAMIN D 25 HYDROXY (VIT D DEFICIENCY, FRACTURES): Vit D, 25-Hydroxy: 27 ng/mL — ABNORMAL LOW (ref 30–100)

## 2019-05-07 ENCOUNTER — Other Ambulatory Visit: Payer: Self-pay | Admitting: Family Medicine

## 2019-05-07 MED ORDER — LEVOTHYROXINE SODIUM 125 MCG PO TABS: 125.0000 ug | ORAL_TABLET | Freq: Every day | ORAL | 1 refills | Status: DC

## 2019-05-09 ENCOUNTER — Telehealth: Payer: Self-pay

## 2019-05-09 NOTE — Telephone Encounter (Signed)
Allergy does not usually cause an elevation in blood glucose.  There is an inhaled steroid in it but no different than other medication such as Advair or Symbicort etc.  Just make sure she is only using it once a day and not twice a day.  Lets keep an eye on it through the weekend.  Make sure hydrating well, avoid pastries over the weekend and see if it normalizes if it still running high then give [place] a call back.

## 2019-05-09 NOTE — Telephone Encounter (Signed)
Patient calls stating that she started her new Trelegy inhaler 3 days ago and noticed today that her sugars have increased. Usually her fasting sugar is lower than 120, but today it was 188.  She is wanting to know if this could be due to inhaler? States her diet has been pretty consistent, she did have a small pstry yesterday.   Please advise

## 2019-05-09 NOTE — Telephone Encounter (Signed)
Pt advised, will track sugars over the weekend and call back Monday if still running high

## 2019-05-21 ENCOUNTER — Telehealth: Payer: Self-pay | Admitting: Family Medicine

## 2019-05-21 NOTE — Telephone Encounter (Signed)
Pt states she takes her Rx's in the am, so she will not take it until we call her back in the morning.   Offered urgent care today but she feels this is medication related and not an acute issue. Will route to PCP

## 2019-05-21 NOTE — Telephone Encounter (Signed)
Pt started taking the Wellbutrin on 05/02/19. Pt states since she started the medicine (the next day, 05/03/19) she has been falling daily. She has fallen out of the bed, in the shower, in the bathroom. I questioned how many times she has fallen and she said "too many to count."   Pt states she hit her head the first fall, but has not hit her head again since.  States she has no sense of balance but that is not new for her. Pt reports it feels like her legs just "give out." Questions if she can just stop the Rx or if she has to be weaned.

## 2019-05-22 NOTE — Telephone Encounter (Signed)
Yes please stop immediately.  Doesn't have to taper. If she is not feeling better in a couple of days please let me kno.

## 2019-05-22 NOTE — Telephone Encounter (Signed)
Pt advised. She will call us back before the end of the day tomorrow and let us know how she's doing after being off the Rx for 24 hours.

## 2019-05-23 NOTE — Telephone Encounter (Signed)
Patient called to say that she is feeling much better

## 2019-06-12 ENCOUNTER — Other Ambulatory Visit: Payer: Self-pay | Admitting: Family Medicine

## 2019-06-12 DIAGNOSIS — E1140 Type 2 diabetes mellitus with diabetic neuropathy, unspecified: Secondary | ICD-10-CM

## 2019-06-14 ENCOUNTER — Other Ambulatory Visit: Payer: Self-pay | Admitting: Family Medicine

## 2019-06-23 DIAGNOSIS — M47816 Spondylosis without myelopathy or radiculopathy, lumbar region: Secondary | ICD-10-CM | POA: Diagnosis not present

## 2019-06-23 DIAGNOSIS — G894 Chronic pain syndrome: Secondary | ICD-10-CM | POA: Diagnosis not present

## 2019-06-23 DIAGNOSIS — M25511 Pain in right shoulder: Secondary | ICD-10-CM | POA: Diagnosis not present

## 2019-06-23 DIAGNOSIS — Z79891 Long term (current) use of opiate analgesic: Secondary | ICD-10-CM | POA: Diagnosis not present

## 2019-06-23 DIAGNOSIS — M5136 Other intervertebral disc degeneration, lumbar region: Secondary | ICD-10-CM | POA: Diagnosis not present

## 2019-06-23 DIAGNOSIS — M1990 Unspecified osteoarthritis, unspecified site: Secondary | ICD-10-CM | POA: Diagnosis not present

## 2019-06-27 ENCOUNTER — Other Ambulatory Visit: Payer: Self-pay | Admitting: Family Medicine

## 2019-06-28 ENCOUNTER — Other Ambulatory Visit: Payer: Self-pay | Admitting: Family Medicine

## 2019-07-21 ENCOUNTER — Other Ambulatory Visit: Payer: Self-pay | Admitting: Family Medicine

## 2019-08-05 ENCOUNTER — Ambulatory Visit (INDEPENDENT_AMBULATORY_CARE_PROVIDER_SITE_OTHER): Payer: Medicare Other | Admitting: Family Medicine

## 2019-08-05 ENCOUNTER — Other Ambulatory Visit: Payer: Self-pay

## 2019-08-05 ENCOUNTER — Encounter: Payer: Self-pay | Admitting: Family Medicine

## 2019-08-05 VITALS — BP 83/40 | HR 91 | Ht 67.0 in | Wt 191.0 lb

## 2019-08-05 DIAGNOSIS — R413 Other amnesia: Secondary | ICD-10-CM | POA: Diagnosis not present

## 2019-08-05 DIAGNOSIS — E039 Hypothyroidism, unspecified: Secondary | ICD-10-CM | POA: Diagnosis not present

## 2019-08-05 DIAGNOSIS — E1140 Type 2 diabetes mellitus with diabetic neuropathy, unspecified: Secondary | ICD-10-CM | POA: Diagnosis not present

## 2019-08-05 DIAGNOSIS — I10 Essential (primary) hypertension: Secondary | ICD-10-CM

## 2019-08-05 LAB — POCT GLYCOSYLATED HEMOGLOBIN (HGB A1C): Hemoglobin A1C: 5.2 % (ref 4.0–5.6)

## 2019-08-05 NOTE — Assessment & Plan Note (Signed)
Well controlled. Continue current regimen. Follow up in  3-4 months.  

## 2019-08-05 NOTE — Assessment & Plan Note (Signed)
A1c is actually even lower today at 5.2.  We discussed that I really think she is probably experiencing some hypoglycemic events based on what she is describing to me.  She had tried to drop her Lantus down to about 10 units and felt like her sugars were actually running higher so she went back up to 16 units.  We will have her stop her insulin completely and call me for blood sugars greater than 200.

## 2019-08-05 NOTE — Patient Instructions (Signed)
Please stop your Lantus. Continue to check your blood sugar in the morning before eating.  If your glucose is greater than 200 then please give me a call back. Data minimize sugary food intake. If you have another episode where you feel shaky and sweaty please check your blood sugar even if you have just eaten.  Go to the lab today to recheck your thyroid.

## 2019-08-05 NOTE — Assessment & Plan Note (Signed)
Well on new dose of thyroid medication.  Due to recheck thyroid levels.  Should go to the lab today.

## 2019-08-05 NOTE — Progress Notes (Signed)
Established Patient Office Visit  Subjective:  Patient ID: [name], female    DOB: [birth year]  Age: 73 y.o. MRN: [id number]  CC:  Chief Complaint  Patient presents with  . Hypertension  . Diabetes  . Hypothyroidism    HPI [name] presents for   Diabetes - no hypoglycemic events. No wounds or sores that are not healing well. No increased thirst or urination. Checking glucose at home. Taking medications as prescribed without any side effects.  Hypertension- Pt denies chest pain, SOB, dizziness, or heart palpitations.  Taking meds as directed w/o problems.  Denies medication side effects.    Follow-up hypothyroidism-her TSH was elevated back in September and we increased her dose 125 mcg daily.  She is also concerned today because she feels that she has been having problems with her memory.  She says she can be in the middle of a conversation and then forget what she is talking about.  She has not noticed that she is changing words out or saying the wrong words.  She has not noticed any slurring of her speech or dramatic hearing or vision changes recently.  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  She admitted receiving multiple injections but decided not to continue with it.  Past Medical History:  Diagnosis Date  . Arthritis   . Charcot-Marie-Tooth disease   . COPD (chronic obstructive pulmonary disease) ([place])   . DDD (degenerative disc disease)    Lumbar and lumbosacral  . Depression   . Diabetes mellitus    type 2  . Diabetic peripheral neuropathy ([place])   . Facet syndrome, lumbar   . Gastric ulcer    w/o hemorrhage  . Hyperlipidemia   . Hypertension   . Insomnia   . Lumbosacral root lesions, not elsewhere classified   . Neurogenic bladder   . Obesity   . Osteopenia   . Other symptoms referable to back   . Post-menopausal   . PUD (peptic ulcer disease)   . 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 Spinal stenosis, lumbar region, without neurogenic claudication   . Thoracic spondylosis without myelopathy   . Thyroid disease    hypo  . Tobacco dependence   . Unspecified hereditary and idiopathic peripheral neuropathy     Past Surgical History:  Procedure Laterality Date  . ANKLE RECONSTRUCTION     left ankle, plate and pins   . bilateral L3 medial branch block    . CHOLECYSTECTOMY    . fibroid tumor removal    . fluoroscopic L5 dorsal medial branch bloc    . RENAL ARTERY STENT  11/28/2010   left  . REPLACEMENT TOTAL KNEE      Family History  Problem Relation Age of Onset  . Stroke Mother   . Heart disease Father 22       heart attack  . Hypertension Father   . Cancer Father        lung  . Diabetes Paternal Aunt        insulin dependent    Social History   Socioeconomic History  . Marital status: Widowed    Spouse name: Not on file  . Number of children: Not on file  . Years of education: Not on file  . Highest education level: Not on file  Occupational History  . Not on file  Tobacco Use  . Smoking status: Current Every Day Smoker    Packs/day: 0.75    Years: 49.00    Pack years: 36.75    Types: Cigarettes  . Smokeless tobacco: Never Used  . Tobacco comment: pt states she is going to try the E-cig  Substance and Sexual Activity  . Alcohol use: No    Alcohol/week: 0.0 standard drinks  . Drug use: No  . Sexual activity: Not on file  Other Topics Concern  . Not on file  Social History Narrative  . Not on file   Social Determinants of Health   Financial Resource Strain:   . Difficulty of Paying Living Expenses: Not on file  Food Insecurity:   . Worried About Charity fundraiser in the Last Year: Not on file  . Ran Out of Food in the Last Year: Not on file  Transportation Needs:   . Lack of Transportation (Medical): Not on file  . Lack of Transportation (Non-Medical): Not on file  Physical Activity:    . Days of Exercise per Week: Not on file  . Minutes of Exercise per Session: Not on file  Stress:   . Feeling of Stress : Not on file  Social Connections:   . Frequency of Communication with Friends and Family: Not on file  . Frequency of Social Gatherings with Friends and Family: Not on file  . Attends Religious Services: Not on file  . Active Member of Clubs or Organizations: Not on file  . Attends Archivist Meetings: Not on file  . Marital Status: Not on file  Intimate Partner Violence:   . Fear of Current or Ex-Partner: Not on file  . Emotionally Abused: Not on file  . Physically Abused: Not on file  . Sexually Abused: Not on file    Outpatient Medications Prior to Visit  Medication Sig Dispense Refill  . alendronate (FOSAMAX) 70 MG tablet TAKE 1 TAB EVERY 7 DAYS. TAKE IN THE MORNING WITH A FULL GLASS OF WATER ON AN EMPTY STOMACH. DO NOT TAKE OTHER FOOD OR DRINK OR LIE DOWN 12 tablet 1  . AMBULATORY NON FORMULARY MEDICATION Medication Name: Auto Pap set to 5cm water pressure with 2 liter nocturnal oxygen as she desats as well. AHI 21.  After one week fax download form machine so we can bettter set her CPAP.  Fax to [facility]. 1 vial 0  . AMBULATORY NON FORMULARY MEDICATION Medication Name: Nebulizer machine with equipment.  Diagnosis COPD.  Patient is primarily homebound.  Is faxed to [facility]. She is an established patient. 1 Units 0  . AMBULATORY NON FORMULARY MEDICATION Medication Name: Rollator  Fax: [phone] 1 each 0  . aspirin EC 81 MG tablet Take 81 mg by mouth daily.    . blood glucose meter kit and supplies KIT Dispense based on patient and insurance preference. Use up to four times daily as directed Dx E11.40 1 each 0  . clopidogrel (PLAVIX) 75 MG tablet TAKE ONE TABLET BY MOUTH EVERY DAY 30 tablet 11  . dicyclomine (BENTYL) 20 MG tablet TAKE ONE TABLET BY MOUTH THREE TIMES DAILY AS NEEDED FOR SPASMS 90 tablet 2  . diphenoxylate-atropine (LOMOTIL)  2.5-0.025 MG tablet TAKE 1 OR 2 TABLETS BY MOUTH TWICE DAILY AS NEEDED FOR DIARRHEA OR LOOSE STOOLS 60 tablet 5  . FLUoxetine (PROZAC) 40 MG capsule Take 2 capsules (80 mg total) by mouth daily. 180 capsule 2  . Fluticasone-Umeclidin-Vilant (TRELEGY ELLIPTA) 100-62.5-25 MCG/INH AEPB Inhale 1  puff into the lungs every morning. 60 each 5  . furosemide (LASIX) 40 MG tablet Take 1 or 2 tablets by mouth every morning. 90 tablet 1  . gabapentin (NEURONTIN) 400 MG capsule TAKE ONE CAPSULE BY MOUTH FOUR TIMES DAILY    . GAMUNEX-C 10 GM/100ML SOLN     . GAMUNEX-C 20 GM/200ML SOLN     . HYDROcodone-acetaminophen (NORCO) 10-325 MG per tablet Take 1 tablet by mouth every 6 (six) hours as needed. Do not take more that 5 tabs a day    . Lancets MISC by Does not apply route.      Marland Kitchen LANTUS SOLOSTAR 100 UNIT/ML Solostar Pen Inject 10-15 Units into the skin at bedtime. 15 mL 1  . levalbuterol (XOPENEX) 0.63 MG/3ML nebulizer solution Take 3 mLs (0.63 mg total) by nebulization every 8 (eight) hours as needed for wheezing or shortness of breath. 60 mL 3  . levothyroxine (SYNTHROID) 125 MCG tablet Take 1 tablet (125 mcg total) by mouth daily. 30 tablet 1  . lidocaine (XYLOCAINE) 5 % ointment Apply 1 application topically daily. 2 hours before skin procedure. 50 g 11  . lidocaine-prilocaine (EMLA) cream Apply 1 application topically as needed. 30 g 0  . metFORMIN (GLUCOPHAGE) 500 MG tablet TAKE ONE TABLET BY MOUTH THREE TIMES DAILY 270 tablet 1  . metoprolol tartrate (LOPRESSOR) 100 MG tablet TAKE 1 TABLET(100 MG TOTAL) BY MOUTH 2(TWO) TIMES DAILY. 180 tablet 1  . nitroGLYCERIN (NITROSTAT) 0.4 MG SL tablet Place 1 tablet (0.4 mg total) under the tongue every 5 (five) minutes as needed. 10 tablet 1  . PRODIGY NO CODING BLOOD GLUC test strip USE AS DIRECTED 100 each 0  . simvastatin (ZOCOR) 40 MG tablet TAKE ONE TABLET BY MOUTH EVERY DAY AT SIX IN THE EVENING 90 tablet 3  . UNIFINE PENTIPS 32G X 4 MM MISC USE WHEN  INJECTING INSULIN DAILY 100 each 6  . zolpidem (AMBIEN) 5 MG tablet Take 1 tablet (5 mg total) by mouth at bedtime. 90 tablet 0  . buPROPion (WELLBUTRIN XL) 150 MG 24 hr tablet Take 1 tablet (150 mg total) by mouth every morning. 90 tablet 1   No facility-administered medications prior to visit.    Allergies  Allergen Reactions  . Varenicline Nausea And Vomiting    ROS Review of Systems    Objective:    Physical Exam  Constitutional: She is oriented to person, place, and time. She appears well-developed and well-nourished.  HENT:  Head: Normocephalic and atraumatic.  Cardiovascular: Normal rate, regular rhythm and normal heart sounds.  Pulmonary/Chest: Effort normal and breath sounds normal.  Neurological: She is alert and oriented to person, place, and time.  Skin: Skin is warm and dry.  Psychiatric: She has a normal mood and affect. Her behavior is normal.    BP (!) 83/40   Pulse 91   Ht '5\' 7"'$  (1.702 m)   Wt 191 lb (86.6 kg)   SpO2 96%   BMI 29.91 kg/m  Wt Readings from Last 3 Encounters:  08/05/19 191 lb (86.6 kg)  05/02/19 187 lb (84.8 kg)  12/06/18 180 lb (81.6 kg)     Health Maintenance Due  Topic Date Due  . URINE MICROALBUMIN  12/05/2017    There are no preventive care reminders to display for this patient.  Lab Results  Component Value Date   TSH 6.88 (H) 01/10/2019   Lab Results  Component Value Date   WBC 7.1 05/02/2019   HGB 13.6  05/02/2019   HCT 40.5 05/02/2019   MCV 98.3 05/02/2019   PLT 278 05/02/2019   Lab Results  Component Value Date   NA 134 (L) 01/10/2019   K 4.6 01/10/2019   CO2 23 01/10/2019   GLUCOSE 119 (H) 01/10/2019   BUN 12 01/10/2019   CREATININE 0.99 (H) 01/10/2019   BILITOT 1.0 01/10/2019   ALKPHOS 51 09/01/2015   AST 28 01/10/2019   ALT 26 01/10/2019   PROT 7.8 01/10/2019   ALBUMIN 3.4 (L) 09/01/2015   CALCIUM 9.3 01/10/2019   Lab Results  Component Value Date   CHOL 128 09/01/2015   Lab Results   Component Value Date   HDL 42 (L) 09/01/2015   Lab Results  Component Value Date   LDLCALC 61 09/01/2015   Lab Results  Component Value Date   TRIG 124 09/01/2015   Lab Results  Component Value Date   CHOLHDL 3.0 09/01/2015   Lab Results  Component Value Date   HGBA1C 5.2 08/05/2019      Assessment & Plan:   Problem List Items Addressed This Visit      Cardiovascular and Mediastinum   HYPERTENSION, BENIGN SYSTEMIC - Primary    Well controlled. Continue current regimen. Follow up in  3-4 months.         Endocrine   Hypothyroidism    Well on new dose of thyroid medication.  Due to recheck thyroid levels.  Should go to the lab today.      Relevant Orders   TSH   Diabetes mellitus with neuropathy ([facility])    A1c is actually even lower today at 5.2.  We discussed that I really think she is probably experiencing some hypoglycemic events based on what she is describing to me.  She had tried to drop her Lantus down to about 10 units and felt like her sugars were actually running higher so she went back up to 16 units.  We will have her stop her insulin completely and call me for blood sugars greater than 200.      Relevant Orders   POCT HgB A1C (Completed)    Other Visit Diagnoses    Memory impairment         Memory impairment - no evident of stroke.  It sound like she has been having some hypoglycemic events so this could be causing some issues as well.  We also discussed increased stress and depression can worsen memory as well. Will monitor. Keep follow-up.     No orders of the defined types were placed in this encounter.   Follow-up: Return in about 2 weeks (around 08/19/2019) for virtual appt for diabetes  .    [name], MD

## 2019-08-06 LAB — TSH: TSH: 2.72 mIU/L (ref 0.40–4.50)

## 2019-08-06 MED ORDER — ALBUTEROL SULFATE 1.25 MG/3ML IN NEBU: 1.0000 | INHALATION_SOLUTION | Freq: Four times a day (QID) | RESPIRATORY_TRACT | 12 refills | Status: DC | PRN

## 2019-08-06 NOTE — Addendum Note (Signed)
Addended by: Dessie Coma on: 08/06/2019 02:26 PM   Modules accepted: Orders

## 2019-08-06 NOTE — Progress Notes (Signed)
Pharmacy called needing an updated prescription for the albuterol solution. I let nick know that I would send this to the pharmacy as an updated prescription. No other questions.

## 2019-08-19 ENCOUNTER — Encounter: Payer: Self-pay | Admitting: Family Medicine

## 2019-08-19 ENCOUNTER — Ambulatory Visit (INDEPENDENT_AMBULATORY_CARE_PROVIDER_SITE_OTHER): Payer: Medicare Other | Admitting: Family Medicine

## 2019-08-19 ENCOUNTER — Other Ambulatory Visit: Payer: Self-pay | Admitting: Family Medicine

## 2019-08-19 DIAGNOSIS — Z7982 Long term (current) use of aspirin: Secondary | ICD-10-CM | POA: Diagnosis not present

## 2019-08-19 DIAGNOSIS — F17210 Nicotine dependence, cigarettes, uncomplicated: Secondary | ICD-10-CM | POA: Diagnosis not present

## 2019-08-19 DIAGNOSIS — E1140 Type 2 diabetes mellitus with diabetic neuropathy, unspecified: Secondary | ICD-10-CM

## 2019-08-19 NOTE — Progress Notes (Signed)
Pt states that she has not had any BS at or over 200. Her BS today it was 171. She said that they have been running around 129-136.  She has only had 1 episode of feeling shaky.

## 2019-08-19 NOTE — Progress Notes (Signed)
Virtual Visit via Telephone Note  I connected with [name] on 08/19/19 at  3:20 PM EST by telephone and verified that I am speaking with the correct person using two identifiers.   I discussed the limitations, risks, security and privacy concerns of performing an evaluation and management service by telephone and the availability of in person appointments. I also discussed with the patient that there may be a patient responsible charge related to this service. The patient expressed understanding and agreed to proceed.  Patient location: Home Provider loccation: In office     Established Patient Office Visit  Subjective:  Patient ID: [name], female    DOB: 1945-08-08  Age: 74 y.o. MRN: [id number]  CC:  Chief Complaint  Patient presents with  . Diabetes    HPI  [name] presents for follow-up of diabetes.  When she was last here about 2 weeks ago she was reporting that she was having significant elevation in blood sugars in the mornings yet her A1c was very low at 5.2.  I was worried that she was actually overmedicating herself with insulin.  She was supposed actually dropped down on her dose but because of the jump in blood sugars actually went back up on her dose.  I had her stop her insulin completely, long-acting, and then follow-up today to let me know what her blood sugars have been doing.  Her best fasting was 117 and the worst was 179 but most of them have been in the 130s and 140s.  She said part of it is just learning what foods trigger a bump in her blood sugars especially off of insulin.  Otherwise though she is doing well she has not experienced any symptoms such as shakes or sweats.  Past Medical History:  Diagnosis Date  . Arthritis   . Charcot-Marie-Tooth disease   . COPD (chronic obstructive pulmonary disease) ([place])   . DDD (degenerative disc disease)    Lumbar and lumbosacral  . Depression   . Diabetes mellitus    type 2  . Diabetic peripheral  neuropathy ([place])   . Facet syndrome, lumbar   . Gastric ulcer    w/o hemorrhage  . Hyperlipidemia   . Hypertension   . Insomnia   . Lumbosacral root lesions, not elsewhere classified   . Neurogenic bladder   . Obesity   . Osteopenia   . Other symptoms referable to back   . Post-menopausal   . PUD (peptic ulcer disease)   . 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 Spinal stenosis, lumbar region, without neurogenic claudication   . Thoracic spondylosis without myelopathy   . Thyroid disease    hypo  . Tobacco dependence   . Unspecified hereditary and idiopathic peripheral neuropathy     Past Surgical History:  Procedure Laterality Date  . ANKLE RECONSTRUCTION     left ankle, plate and pins   . bilateral L3 medial branch block    . CHOLECYSTECTOMY    . fibroid tumor removal    . fluoroscopic L5 dorsal medial branch bloc    . RENAL ARTERY STENT  11/28/2010   left  . REPLACEMENT TOTAL KNEE      Family History  Problem Relation Age of Onset  . Stroke Mother   . Heart disease Father 64       heart attack  . Hypertension Father   . Cancer Father  lung  . Diabetes Paternal Aunt        insulin dependent    Social History   Socioeconomic History  . Marital status: Widowed    Spouse name: Not on file  . Number of children: Not on file  . Years of education: Not on file  . Highest education level: Not on file  Occupational History  . Not on file  Tobacco Use  . Smoking status: Current Every Day Smoker    Packs/day: 0.75    Years: 49.00    Pack years: 36.75    Types: Cigarettes  . Smokeless tobacco: Never Used  . Tobacco comment: pt states she is going to try the E-cig  Substance and Sexual Activity  . Alcohol use: No    Alcohol/week: 0.0 standard drinks  . Drug use: No  . Sexual activity: Not on file  Other Topics Concern  . Not on file  Social History Narrative  . Not on file   Social Determinants of Health   Financial Resource  Strain:   . Difficulty of Paying Living Expenses: Not on file  Food Insecurity:   . Worried About Charity fundraiser in the Last Year: Not on file  . Ran Out of Food in the Last Year: Not on file  Transportation Needs:   . Lack of Transportation (Medical): Not on file  . Lack of Transportation (Non-Medical): Not on file  Physical Activity:   . Days of Exercise per Week: Not on file  . Minutes of Exercise per Session: Not on file  Stress:   . Feeling of Stress : Not on file  Social Connections:   . Frequency of Communication with Friends and Family: Not on file  . Frequency of Social Gatherings with Friends and Family: Not on file  . Attends Religious Services: Not on file  . Active Member of Clubs or Organizations: Not on file  . Attends Archivist Meetings: Not on file  . Marital Status: Not on file  Intimate Partner Violence:   . Fear of Current or Ex-Partner: Not on file  . Emotionally Abused: Not on file  . Physically Abused: Not on file  . Sexually Abused: Not on file    Outpatient Medications Prior to Visit  Medication Sig Dispense Refill  . albuterol (ACCUNEB) 1.25 MG/3ML nebulizer solution Take 3 mLs (1.25 mg total) by nebulization every 6 (six) hours as needed for wheezing or shortness of breath. 75 mL 12  . alendronate (FOSAMAX) 70 MG tablet TAKE 1 TAB EVERY 7 DAYS. TAKE IN THE MORNING WITH A FULL GLASS OF WATER ON AN EMPTY STOMACH. DO NOT TAKE OTHER FOOD OR DRINK OR LIE DOWN 12 tablet 1  . AMBULATORY NON FORMULARY MEDICATION Medication Name: Auto Pap set to 5cm water pressure with 2 liter nocturnal oxygen as she desats as well. AHI 21.  After one week fax download form machine so we can bettter set her CPAP.  Fax to [employer]. 1 vial 0  . AMBULATORY NON FORMULARY MEDICATION Medication Name: Nebulizer machine with equipment.  Diagnosis COPD.  Patient is primarily homebound.  Is faxed to [employer]. She is an established patient. 1 Units 0  . AMBULATORY NON FORMULARY  MEDICATION Medication Name: Rollator  Fax: [phone] 1 each 0  . aspirin EC 81 MG tablet Take 81 mg by mouth daily.    . blood glucose meter kit and supplies KIT Dispense based on patient and insurance preference. Use up to four times daily  as directed Dx E11.40 1 each 0  . clopidogrel (PLAVIX) 75 MG tablet TAKE ONE TABLET BY MOUTH EVERY DAY 30 tablet 11  . dicyclomine (BENTYL) 20 MG tablet TAKE ONE TABLET BY MOUTH THREE TIMES DAILY AS NEEDED FOR SPASMS 90 tablet 2  . diphenoxylate-atropine (LOMOTIL) 2.5-0.025 MG tablet TAKE 1 OR 2 TABLETS BY MOUTH TWICE DAILY AS NEEDED FOR DIARRHEA OR LOOSE STOOLS 60 tablet 5  . FLUoxetine (PROZAC) 40 MG capsule Take 2 capsules (80 mg total) by mouth daily. 180 capsule 2  . Fluticasone-Umeclidin-Vilant (TRELEGY ELLIPTA) 100-62.5-25 MCG/INH AEPB Inhale 1 puff into the lungs every morning. 60 each 5  . furosemide (LASIX) 40 MG tablet Take 1 or 2 tablets by mouth every morning. 90 tablet 1  . gabapentin (NEURONTIN) 400 MG capsule TAKE ONE CAPSULE BY MOUTH FOUR TIMES DAILY    . GAMUNEX-C 10 GM/100ML SOLN     . GAMUNEX-C 20 GM/200ML SOLN     . HYDROcodone-acetaminophen (NORCO) 10-325 MG per tablet Take 1 tablet by mouth every 6 (six) hours as needed. Do not take more that 5 tabs a day    . Lancets MISC by Does not apply route.      Marland Kitchen LANTUS SOLOSTAR 100 UNIT/ML Solostar Pen Inject 10-15 Units into the skin at bedtime. 15 mL 1  . levalbuterol (XOPENEX) 0.63 MG/3ML nebulizer solution Take 3 mLs (0.63 mg total) by nebulization every 8 (eight) hours as needed for wheezing or shortness of breath. 60 mL 3  . levothyroxine (SYNTHROID) 125 MCG tablet Take 1 tablet (125 mcg total) by mouth daily. 30 tablet 2  . lidocaine (XYLOCAINE) 5 % ointment Apply 1 application topically daily. 2 hours before skin procedure. 50 g 11  . lidocaine-prilocaine (EMLA) cream Apply 1 application topically as needed. 30 g 0  . metFORMIN (GLUCOPHAGE) 500 MG tablet TAKE ONE TABLET BY MOUTH  THREE TIMES DAILY 270 tablet 1  . metoprolol tartrate (LOPRESSOR) 100 MG tablet TAKE 1 TABLET(100 MG TOTAL) BY MOUTH 2(TWO) TIMES DAILY. 180 tablet 1  . nitroGLYCERIN (NITROSTAT) 0.4 MG SL tablet Place 1 tablet (0.4 mg total) under the tongue every 5 (five) minutes as needed. 10 tablet 1  . PRODIGY NO CODING BLOOD GLUC test strip USE AS DIRECTED 100 each 0  . simvastatin (ZOCOR) 40 MG tablet TAKE ONE TABLET BY MOUTH EVERY DAY AT SIX IN THE EVENING 90 tablet 3  . UNIFINE PENTIPS 32G X 4 MM MISC USE WHEN INJECTING INSULIN DAILY 100 each 6  . zolpidem (AMBIEN) 5 MG tablet Take 1 tablet (5 mg total) by mouth at bedtime. 90 tablet 0   No facility-administered medications prior to visit.    Allergies  Allergen Reactions  . Varenicline Nausea And Vomiting    ROS Review of Systems    Objective:    Physical Exam  There were no vitals taken for this visit. Wt Readings from Last 3 Encounters:  08/05/19 191 lb (86.6 kg)  05/02/19 187 lb (84.8 kg)  12/06/18 180 lb (81.6 kg)     Health Maintenance Due  Topic Date Due  . URINE MICROALBUMIN  12/05/2017    There are no preventive care reminders to display for this patient.  Lab Results  Component Value Date   TSH 2.72 08/05/2019   Lab Results  Component Value Date   WBC 7.1 05/02/2019   HGB 13.6 05/02/2019   HCT 40.5 05/02/2019   MCV 98.3 05/02/2019   PLT 278 05/02/2019   Lab Results  Component Value Date   NA 134 (L) 01/10/2019   K 4.6 01/10/2019   CO2 23 01/10/2019   GLUCOSE 119 (H) 01/10/2019   BUN 12 01/10/2019   CREATININE 0.99 (H) 01/10/2019   BILITOT 1.0 01/10/2019   ALKPHOS 51 09/01/2015   AST 28 01/10/2019   ALT 26 01/10/2019   PROT 7.8 01/10/2019   ALBUMIN 3.4 (L) 09/01/2015   CALCIUM 9.3 01/10/2019   Lab Results  Component Value Date   CHOL 128 09/01/2015   Lab Results  Component Value Date   HDL 42 (L) 09/01/2015   Lab Results  Component Value Date   LDLCALC 61 09/01/2015   Lab Results   Component Value Date   TRIG 124 09/01/2015   Lab Results  Component Value Date   CHOLHDL 3.0 09/01/2015   Lab Results  Component Value Date   HGBA1C 5.2 08/05/2019      Assessment & Plan:   Problem List Items Addressed This Visit      Endocrine   Diabetes mellitus with neuropathy ([place])    Seeing blood sugars mostly in the 130s and 140s which I think based on her age is completely acceptable.  In fact I think her next A1c will still look great.  I was worried that she was actually probably having some hypoglycemia in the middle the night and not realizing it and then waking up with rebound hyperglycemia.  I think she is doing great off of the insulin.  We did discuss in the future at some point we could always consider a short acting insulin to use just as needed if she is going to eat larger meals or things that are sweet but part of it she is learning what seems to trigger her sugars we also discussed the importance of eating a protein with her carbs to better help her balance process the glucose.  She says she will try that and see if she notices better regulation of her sugars.  Plan to follow-up in 3 months.         No orders of the defined types were placed in this encounter.   Follow-up: Return in about 3 months (around 11/17/2019) for Diabetes follow-up.   Time spent 18 minutes in nonface-to-face encounter.   I discussed the assessment and treatment plan with the patient. The patient was provided an opportunity to ask questions and all were answered. The patient agreed with the plan and demonstrated an understanding of the instructions.   The patient was advised to call back or seek an in-person evaluation if the symptoms worsen or if the condition fails to improve as anticipated.   [name], MD

## 2019-08-19 NOTE — Assessment & Plan Note (Signed)
Seeing blood sugars mostly in the 130s and 140s which I think based on her age is completely acceptable.  In fact I think her next A1c will still look great.  I was worried that she was actually probably having some hypoglycemia in the middle the night and not realizing it and then waking up with rebound hyperglycemia.  I think she is doing great off of the insulin.  We did discuss in the future at some point we could always consider a short acting insulin to use just as needed if she is going to eat larger meals or things that are sweet but part of it she is learning what seems to trigger her sugars we also discussed the importance of eating a protein with her carbs to better help her balance process the glucose.  She says she will try that and see if she notices better regulation of her sugars.  Plan to follow-up in 3 months.

## 2019-08-21 ENCOUNTER — Other Ambulatory Visit: Payer: Self-pay | Admitting: Family Medicine

## 2019-08-26 ENCOUNTER — Other Ambulatory Visit: Payer: Self-pay | Admitting: Family Medicine

## 2019-08-27 ENCOUNTER — Other Ambulatory Visit: Payer: Self-pay | Admitting: Family Medicine

## 2019-08-29 ENCOUNTER — Other Ambulatory Visit: Payer: Self-pay | Admitting: Family Medicine

## 2019-09-05 ENCOUNTER — Other Ambulatory Visit: Payer: Self-pay | Admitting: Family Medicine

## 2019-09-05 DIAGNOSIS — K591 Functional diarrhea: Secondary | ICD-10-CM

## 2019-09-10 ENCOUNTER — Encounter: Payer: Self-pay | Admitting: Family Medicine

## 2019-09-10 ENCOUNTER — Other Ambulatory Visit: Payer: Self-pay

## 2019-09-10 ENCOUNTER — Ambulatory Visit (INDEPENDENT_AMBULATORY_CARE_PROVIDER_SITE_OTHER): Payer: Medicare Other | Admitting: Family Medicine

## 2019-09-10 VITALS — BP 133/79 | HR 62 | Ht 67.0 in | Wt 192.0 lb

## 2019-09-10 DIAGNOSIS — M25519 Pain in unspecified shoulder: Secondary | ICD-10-CM | POA: Insufficient documentation

## 2019-09-10 DIAGNOSIS — H8110 Benign paroxysmal vertigo, unspecified ear: Secondary | ICD-10-CM | POA: Diagnosis not present

## 2019-09-10 DIAGNOSIS — E1140 Type 2 diabetes mellitus with diabetic neuropathy, unspecified: Secondary | ICD-10-CM

## 2019-09-10 DIAGNOSIS — M47816 Spondylosis without myelopathy or radiculopathy, lumbar region: Secondary | ICD-10-CM | POA: Insufficient documentation

## 2019-09-10 DIAGNOSIS — M7551 Bursitis of right shoulder: Secondary | ICD-10-CM | POA: Insufficient documentation

## 2019-09-10 DIAGNOSIS — M19011 Primary osteoarthritis, right shoulder: Secondary | ICD-10-CM | POA: Insufficient documentation

## 2019-09-10 DIAGNOSIS — G894 Chronic pain syndrome: Secondary | ICD-10-CM | POA: Insufficient documentation

## 2019-09-10 DIAGNOSIS — M25511 Pain in right shoulder: Secondary | ICD-10-CM | POA: Insufficient documentation

## 2019-09-10 DIAGNOSIS — M47817 Spondylosis without myelopathy or radiculopathy, lumbosacral region: Secondary | ICD-10-CM | POA: Insufficient documentation

## 2019-09-10 MED ORDER — MECLIZINE HCL 25 MG PO TABS: 25.0000 mg | ORAL_TABLET | Freq: Three times a day (TID) | ORAL | 0 refills | Status: DC | PRN

## 2019-09-10 MED ORDER — ZOLPIDEM TARTRATE 5 MG PO TABS: 5.0000 mg | ORAL_TABLET | Freq: Every day | ORAL | 0 refills | Status: DC

## 2019-09-10 NOTE — Assessment & Plan Note (Signed)
Blood sugars much better since she dropped down to just her Metformin alone.  Keep follow-up in March or April for diabetes.

## 2019-09-10 NOTE — Progress Notes (Signed)
Established Patient Office Visit  Subjective:  Patient ID: [name], female    DOB: Apr 22, 1946  Age: 74 y.o. MRN: [id number]  CC:  Chief Complaint  Patient presents with  . Dizziness  . Gait Problem    HPI [name] presents for dizziness x 3-4 days. Worse with movement.  Feels like her eye are moving rapidly and has to close her eyes and then feels "floaty" when she opens them up.  No recent HA.  Had similar sxs years ago when liver in [place] and took meclizine.  She is actually feeling a lot better today.  She says she is not sure if it is affecting more the right side of the left side she has not had any hearing changes or pain in her ears or pressure or recent cold symptoms.  F/U Diabetes - only taking her metformin bc of low blood sugar.  Her blood sugars have been OK.  Has had some sinus congestion but this is chronic.   Past Medical History:  Diagnosis Date  . Arthritis   . Charcot-Marie-Tooth disease   . COPD (chronic obstructive pulmonary disease) ([place])   . DDD (degenerative disc disease)    Lumbar and lumbosacral  . Depression   . Diabetes mellitus    type 2  . Diabetic peripheral neuropathy ([place])   . Facet syndrome, lumbar   . Gastric ulcer    w/o hemorrhage  . Hyperlipidemia   . Hypertension   . Insomnia   . Lumbosacral root lesions, not elsewhere classified   . Neurogenic bladder   . Obesity   . Osteopenia   . Other symptoms referable to back   . Post-menopausal   . PUD (peptic ulcer disease)   . 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 Spinal stenosis, lumbar region, without neurogenic claudication   . Thoracic spondylosis without myelopathy   . Thyroid disease    hypo  . Tobacco dependence   . Unspecified hereditary and idiopathic peripheral neuropathy     Past Surgical History:  Procedure Laterality Date  . ANKLE RECONSTRUCTION     left ankle, plate and pins   . bilateral L3 medial branch block    .  CHOLECYSTECTOMY    . fibroid tumor removal    . fluoroscopic L5 dorsal medial branch bloc    . RENAL ARTERY STENT  11/28/2010   left  . REPLACEMENT TOTAL KNEE      Family History  Problem Relation Age of Onset  . Stroke Mother   . Heart disease Father 63       heart attack  . Hypertension Father   . Cancer Father        lung  . Diabetes Paternal Aunt        insulin dependent    Social History   Socioeconomic History  . Marital status: Widowed    Spouse name: Not on file  . Number of children: Not on file  . Years of education: Not on file  . Highest education level: Not on file  Occupational History  . Not on file  Tobacco Use  . Smoking status: Current Every Day Smoker    Packs/day: 0.75    Years: 49.00    Pack years: 36.75    Types: Cigarettes  . Smokeless tobacco: Never Used  . Tobacco comment: pt states she is going to try the E-cig  Substance and Sexual Activity  . Alcohol use: No  Alcohol/week: 0.0 standard drinks  . Drug use: No  . Sexual activity: Not on file  Other Topics Concern  . Not on file  Social History Narrative  . Not on file   Social Determinants of Health   Financial Resource Strain:   . Difficulty of Paying Living Expenses: Not on file  Food Insecurity:   . Worried About Charity fundraiser in the Last Year: Not on file  . Ran Out of Food in the Last Year: Not on file  Transportation Needs:   . Lack of Transportation (Medical): Not on file  . Lack of Transportation (Non-Medical): Not on file  Physical Activity:   . Days of Exercise per Week: Not on file  . Minutes of Exercise per Session: Not on file  Stress:   . Feeling of Stress : Not on file  Social Connections:   . Frequency of Communication with Friends and Family: Not on file  . Frequency of Social Gatherings with Friends and Family: Not on file  . Attends Religious Services: Not on file  . Active Member of Clubs or Organizations: Not on file  . Attends Theatre manager Meetings: Not on file  . Marital Status: Not on file  Intimate Partner Violence:   . Fear of Current or Ex-Partner: Not on file  . Emotionally Abused: Not on file  . Physically Abused: Not on file  . Sexually Abused: Not on file    Outpatient Medications Prior to Visit  Medication Sig Dispense Refill  . albuterol (ACCUNEB) 1.25 MG/3ML nebulizer solution Take 3 mLs (1.25 mg total) by nebulization every 6 (six) hours as needed for wheezing or shortness of breath. 75 mL 12  . alendronate (FOSAMAX) 70 MG tablet TAKE 1 TAB EVERY 7 DAYS. TAKE IN THE MORNING WITH A FULL GLASS OF WATER ON AN EMPTY STOMACH. DO NOT TAKE OTHER FOOD OR DRINK OR LIE DOWN 12 tablet 1  . AMBULATORY NON FORMULARY MEDICATION Medication Name: Auto Pap set to 5cm water pressure with 2 liter nocturnal oxygen as she desats as well. AHI 21.  After one week fax download form machine so we can bettter set her CPAP.  Fax to [facility]. 1 vial 0  . AMBULATORY NON FORMULARY MEDICATION Medication Name: Nebulizer machine with equipment.  Diagnosis COPD.  Patient is primarily homebound.  Is faxed to [employer]. She is an established patient. 1 Units 0  . aspirin EC 81 MG tablet Take 81 mg by mouth daily.    . blood glucose meter kit and supplies KIT Dispense based on patient and insurance preference. Use up to four times daily as directed Dx E11.40 1 each 0  . clopidogrel (PLAVIX) 75 MG tablet TAKE ONE TABLET BY MOUTH EVERY DAY 30 tablet 11  . dicyclomine (BENTYL) 20 MG tablet TAKE ONE TABLET BY MOUTH THREE TIMES DAILY AS NEEDED FOR SPASMS 90 tablet 2  . diphenoxylate-atropine (LOMOTIL) 2.5-0.025 MG tablet TABLET ONE OR TWO TABLETS BY MOUTH TWICE DAILY AS NEEDED FOR DIARRHEA OR LOOSE STOOLS 60 tablet 5  . FLUoxetine (PROZAC) 40 MG capsule Take 2 capsules (80 mg total) by mouth daily. 180 capsule 2  . Fluticasone-Umeclidin-Vilant (TRELEGY ELLIPTA) 100-62.5-25 MCG/INH AEPB Inhale 1 puff into the lungs every morning. 60 each 5  .  furosemide (LASIX) 40 MG tablet Take 1 or 2 tablets by mouth every morning. 90 tablet 1  . gabapentin (NEURONTIN) 400 MG capsule TAKE ONE CAPSULE BY MOUTH FOUR TIMES DAILY    .  GAMUNEX-C 10 GM/100ML SOLN     . GAMUNEX-C 20 GM/200ML SOLN     . HYDROcodone-acetaminophen (NORCO) 10-325 MG per tablet Take 1 tablet by mouth every 6 (six) hours as needed. Do not take more that 5 tabs a day    . Lancets MISC by Does not apply route.      . levalbuterol (XOPENEX) 0.63 MG/3ML nebulizer solution Take 3 mLs (0.63 mg total) by nebulization every 8 (eight) hours as needed for wheezing or shortness of breath. 60 mL 3  . levothyroxine (SYNTHROID) 125 MCG tablet Take 1 tablet (125 mcg total) by mouth daily. 30 tablet 2  . lidocaine (XYLOCAINE) 5 % ointment Apply 1 application topically daily. 2 hours before skin procedure. 35.44 g 11  . lidocaine-prilocaine (EMLA) cream Apply 1 application topically as needed. 30 g 0  . metFORMIN (GLUCOPHAGE) 500 MG tablet TAKE ONE TABLET BY MOUTH THREE TIMES DAILY 270 tablet 1  . metoprolol tartrate (LOPRESSOR) 100 MG tablet TAKE 1 TABLET(100 MG TOTAL) BY MOUTH 2(TWO) TIMES DAILY. 180 tablet 1  . nitroGLYCERIN (NITROSTAT) 0.4 MG SL tablet Place 1 tablet (0.4 mg total) under the tongue every 5 (five) minutes as needed. 10 tablet 1  . PRODIGY NO CODING BLOOD GLUC test strip USE AS DIRECTED 100 each 0  . simvastatin (ZOCOR) 40 MG tablet TAKE ONE TABLET BY MOUTH EVERY DAY AT SIX IN THE EVENING 90 tablet 3  . sodium chloride 0.9 % infusion     . UNIFINE PENTIPS 32G X 4 MM MISC USE WHEN INJECTING INSULIN DAILY 100 each 6  . zolpidem (AMBIEN) 5 MG tablet Take 1 tablet (5 mg total) by mouth at bedtime. 90 tablet 0  . LANTUS SOLOSTAR 100 UNIT/ML Solostar Pen Inject 10-15 Units into the skin at bedtime. (Patient not taking: Reported on 09/10/2019) 15 mL 1  . AMBULATORY NON FORMULARY MEDICATION Medication Name: Rollator  Fax: [phone] 1 each 0   No facility-administered medications  prior to visit.    Allergies  Allergen Reactions  . Varenicline Nausea And Vomiting    ROS Review of Systems    Objective:    Physical Exam  Constitutional: She is oriented to person, place, and time. She appears well-developed and well-nourished.  HENT:  Head: Normocephalic and atraumatic.  Right Ear: External ear normal.  Left Ear: External ear normal.  Right TM and canal is clear.  Left TM is blocked by cerumen normally is able to see a partial view of the TM but what I can see is clear no erythema.  Eyes: Conjunctivae are normal.  Cardiovascular: Normal rate, regular rhythm and normal heart sounds.  Pulmonary/Chest: Effort normal and breath sounds normal.  Musculoskeletal:     Comments: Able to turn her head right to left without any difficulty or recurrence of dizziness.  That she is feeling better today.  Neurological: She is alert and oriented to person, place, and time.  Skin: Skin is warm and dry.  Psychiatric: She has a normal mood and affect. Her behavior is normal.    BP 133/79   Pulse 62   Ht _0  (1.702 m)   Wt 192 lb (87.1 kg)   SpO2 99%   BMI 30.07 kg/m  Wt Readings from Last 3 Encounters:  09/10/19 192 lb (87.1 kg)  08/05/19 191 lb (86.6 kg)  05/02/19 187 lb (84.8 kg)     Health Maintenance Due  Topic Date Due  . URINE MICROALBUMIN  12/05/2017    There  are no preventive care reminders to display for this patient.  Lab Results  Component Value Date   TSH 2.72 08/05/2019   Lab Results  Component Value Date   WBC 7.1 05/02/2019   HGB 13.6 05/02/2019   HCT 40.5 05/02/2019   MCV 98.3 05/02/2019   PLT 278 05/02/2019   Lab Results  Component Value Date   NA 134 (L) 01/10/2019   K 4.6 01/10/2019   CO2 23 01/10/2019   GLUCOSE 119 (H) 01/10/2019   BUN 12 01/10/2019   CREATININE 0.99 (H) 01/10/2019   BILITOT 1.0 01/10/2019   ALKPHOS 51 09/01/2015   AST 28 01/10/2019   ALT 26 01/10/2019   PROT 7.8 01/10/2019   ALBUMIN 3.4 (L)  09/01/2015   CALCIUM 9.3 01/10/2019   Lab Results  Component Value Date   CHOL 128 09/01/2015   Lab Results  Component Value Date   HDL 42 (L) 09/01/2015   Lab Results  Component Value Date   LDLCALC 61 09/01/2015   Lab Results  Component Value Date   TRIG 124 09/01/2015   Lab Results  Component Value Date   CHOLHDL 3.0 09/01/2015   Lab Results  Component Value Date   HGBA1C 5.2 08/05/2019      Assessment & Plan:   Problem List Items Addressed This Visit      Endocrine   Diabetes mellitus with neuropathy ([place])    Blood sugars much better since she dropped down to just her Metformin alone.  Keep follow-up in March or April for diabetes.       Other Visit Diagnoses    Benign paroxysmal positional vertigo, unspecified laterality    -  Primary     She was not willing to try to get up on the exam table she was very fearful to do so so was unable to actually do a Dix-Hallpike maneuver to truly evaluate whether or not she has BPPV.  But based on history and current symptoms it sounds most consistent so I think it is worth a trial of home exercises and meclizine.  Did warn about potential sedation with meclizine.  Also if the home exercises are not working we also discussed possibly doing formal physical therapy with vestibular rehab.  Often times and only takes a couple of sessions to get significant relief.  She will think about it and let me know but she would prefer to try the home stretches first.  Diabetes-sounds like she is no longer having any significant hypoglycemic episodes which is reassuring.  Keep follow-up in 2 months for diabetes.  Meds ordered this encounter  Medications  . meclizine (ANTIVERT) 25 MG tablet    Sig: Take 1 tablet (25 mg total) by mouth 3 (three) times daily as needed for dizziness.    Dispense:  30 tablet    Refill:  0  . zolpidem (AMBIEN) 5 MG tablet    Sig: Take 1 tablet (5 mg total) by mouth at bedtime.    Dispense:  90 tablet     Refill:  0    Follow-up: No follow-ups on file.    [name], MD

## 2019-09-10 NOTE — Progress Notes (Signed)
Pt stated that she has felt dizzy for the past 3-4 days. Her BS have been running around 146-162. She said that the dizziness occurs when she does any movements and lasts for a few minutes. She doesn't feel that this is coming from her sinuses. She hasn't had any episodes today but did inform me that when she lived in [place] this happened to her and was given Meclizine and that helped her. She denies any SOB or palpitations.   She also said that she feels that her eye go side to side really fast and she has to close her eyes before it goes away. And when she opens her eyes she feels "floaty".

## 2019-10-25 ENCOUNTER — Other Ambulatory Visit: Payer: Self-pay | Admitting: Family Medicine

## 2019-10-27 DIAGNOSIS — G6181 Chronic inflammatory demyelinating polyneuritis: Secondary | ICD-10-CM | POA: Diagnosis not present

## 2019-10-27 DIAGNOSIS — F321 Major depressive disorder, single episode, moderate: Secondary | ICD-10-CM | POA: Diagnosis not present

## 2019-10-27 DIAGNOSIS — R270 Ataxia, unspecified: Secondary | ICD-10-CM | POA: Diagnosis not present

## 2019-10-27 DIAGNOSIS — R519 Headache, unspecified: Secondary | ICD-10-CM | POA: Diagnosis not present

## 2019-10-27 DIAGNOSIS — S0990XA Unspecified injury of head, initial encounter: Secondary | ICD-10-CM | POA: Diagnosis not present

## 2019-10-31 DIAGNOSIS — R519 Headache, unspecified: Secondary | ICD-10-CM | POA: Diagnosis not present

## 2019-10-31 DIAGNOSIS — Z23 Encounter for immunization: Secondary | ICD-10-CM | POA: Diagnosis not present

## 2019-10-31 DIAGNOSIS — R270 Ataxia, unspecified: Secondary | ICD-10-CM | POA: Diagnosis not present

## 2019-11-03 DIAGNOSIS — S0232XA Fracture of orbital floor, left side, initial encounter for closed fracture: Secondary | ICD-10-CM | POA: Diagnosis not present

## 2019-11-03 LAB — HM DIABETES EYE EXAM

## 2019-11-17 ENCOUNTER — Other Ambulatory Visit: Payer: Self-pay | Admitting: Family Medicine

## 2019-11-17 ENCOUNTER — Encounter: Payer: Self-pay | Admitting: Family Medicine

## 2019-11-17 ENCOUNTER — Ambulatory Visit (INDEPENDENT_AMBULATORY_CARE_PROVIDER_SITE_OTHER): Payer: Medicare Other | Admitting: Family Medicine

## 2019-11-17 VITALS — Ht 67.0 in

## 2019-11-17 DIAGNOSIS — F17200 Nicotine dependence, unspecified, uncomplicated: Secondary | ICD-10-CM

## 2019-11-17 DIAGNOSIS — E039 Hypothyroidism, unspecified: Secondary | ICD-10-CM | POA: Diagnosis not present

## 2019-11-17 DIAGNOSIS — N1832 Chronic kidney disease, stage 3b: Secondary | ICD-10-CM | POA: Diagnosis not present

## 2019-11-17 DIAGNOSIS — Z7982 Long term (current) use of aspirin: Secondary | ICD-10-CM

## 2019-11-17 DIAGNOSIS — I10 Essential (primary) hypertension: Secondary | ICD-10-CM | POA: Diagnosis not present

## 2019-11-17 DIAGNOSIS — J449 Chronic obstructive pulmonary disease, unspecified: Secondary | ICD-10-CM | POA: Diagnosis not present

## 2019-11-17 DIAGNOSIS — F5101 Primary insomnia: Secondary | ICD-10-CM

## 2019-11-17 DIAGNOSIS — E1140 Type 2 diabetes mellitus with diabetic neuropathy, unspecified: Secondary | ICD-10-CM

## 2019-11-17 DIAGNOSIS — F17210 Nicotine dependence, cigarettes, uncomplicated: Secondary | ICD-10-CM | POA: Diagnosis not present

## 2019-11-17 NOTE — Progress Notes (Signed)
Virtual Visit via Telephone Note  I connected with [name] on 11/17/19 at  1:00 PM EDT by telephone and verified that I am speaking with the correct person using two identifiers.   I discussed the limitations, risks, security and privacy concerns of performing an evaluation and management service by telephone and the availability of in person appointments. I also discussed with the patient that there may be a patient responsible charge related to this service. The patient expressed understanding and agreed to proceed.  Patient location: at home.  Provider loccation: In office   Subjective:    CC: F/U DM  HPI: Pt reports that she fell about 8 wks ago while she was putting away some groceries and began to fall forward. She hit her face and heard a crack. She did not go to the ED, denied any LOC. she used ice packs and had a scheduled f/u with her rheumatologist and was advised to get an MRI done and it was discovered that she had fractured her L Orbital lobe.she has been seen by opthalmology and was told that she will need to restart her eye injections and to f/u with dentistry for her teeth. She said that they feel funny. She hasn't had any headaches other than sinus type headaches.   Her Blood sugars are running around 165-189. This morning it was 165. She is not taking the Lantus only the Metformin. Has been eating some chips.   COPD - has been getting some sores in her mouth.  But she likes the trelegy and really feels like helps her breathing. She is still smoking 1 ppd.  Used to smoke 2 PPD.  She knows she really needs to quit.   Hypertension-no recent chest pain or shortness of breath.  She does have a nurse who comes out every 2 weeks and checks her blood pressure.  She does not have her own home cuff.  Past medical history, Surgical history, Family history not pertinant except as noted below, Social history, Allergies, and medications have been entered into the medical record,  reviewed, and corrections made.   Review of Systems: No fevers, chills, night sweats, weight loss, chest pain, or shortness of breath.   Objective:    General: Speaking clearly in complete sentences without any shortness of breath.  Alert and oriented x3.  Normal judgment. No apparent acute distress.    Impression and Recommendations:    Diabetes mellitus with neuropathy Please restart the Lantus at 8 units and go up by 2 units every 3-4 days until glucose under 130. Then stay at that dose.    HYPERTENSION, BENIGN SYSTEMIC Home nurse checking BP every 2 weeks.  COPD (chronic obstructive pulmonary disease) with chronic bronchitis (HCC) Thinking about cutting back on cigs. She is wondering if needs oxygen. Really encouraged her to cut back on cigs. Also discussed changing meds since getting mouth sore but says she wants to stick with the Trelegy.    TOBACCO DEPENDENCE encourage her to cut back. Discussed strategies around this.   INSOMNIA Ambien 5 mg nightly.  Continue current regimen.  Monitor for excess sedation.  Chronic kidney disease (CKD) stage G3b/A2, moderately decreased glomerular filtration rate (GFR) between 30-44 mL/min/1.73 square meter and albuminuria creatinine ratio between 30-299 mg/g (HCC) For repeat renal function check as well as urine microalbumin.  She will try to get to the lab in the next couple of weeks.  Just reminded her that she is due.     I discussed  the assessment and treatment plan with the patient. The patient was provided an opportunity to ask questions and all were answered. The patient agreed with the plan and demonstrated an understanding of the instructions.   The patient was advised to call back or seek an in-person evaluation if the symptoms worsen or if the condition fails to improve as anticipated.  I provided 30 minutes of non-face-to-face time during this encounter.   [name], MD

## 2019-11-17 NOTE — Assessment & Plan Note (Signed)
encourage her to cut back. Discussed strategies around this.

## 2019-11-17 NOTE — Assessment & Plan Note (Signed)
Ambien 5 mg nightly.  Continue current regimen.  Monitor for excess sedation.

## 2019-11-17 NOTE — Assessment & Plan Note (Signed)
Home nurse checking BP every 2 weeks.

## 2019-11-17 NOTE — Progress Notes (Signed)
Pt reports that she fell about 8 wks ago while she was putting away some groceries and began to fall forward. She hit her face and heard a crack. She did not go to the ED, denied any LOC. she used ice packs and had a scheduled f/u with her rheumatologist and was advised to get an MRI done and it was discovered that she had fractured her L Orbital lobe.she has been seen by opthalmology and was told that she will need to restart her eye injections and to f/u with dentistry for her teeth. She said that they feel funny. She hasn't had any headaches other than sinus type headaches.    Her Blood sugars are running around 165-189. This morning it was 165. She is not taking the Lantus only the Metformin.

## 2019-11-17 NOTE — Assessment & Plan Note (Addendum)
Thinking about cutting back on cigs. She is wondering if needs oxygen. Really encouraged her to cut back on cigs. Also discussed changing meds since getting mouth sore but says she wants to stick with the Trelegy.

## 2019-11-17 NOTE — Assessment & Plan Note (Signed)
For repeat renal function check as well as urine microalbumin.  She will try to get to the lab in the next couple of weeks.  Just reminded her that she is due.

## 2019-11-17 NOTE — Assessment & Plan Note (Signed)
Please restart the Lantus at 8 units and go up by 2 units every 3-4 days until glucose under 130. Then stay at that dose.

## 2019-11-22 ENCOUNTER — Other Ambulatory Visit: Payer: Self-pay | Admitting: Family Medicine

## 2019-11-27 ENCOUNTER — Other Ambulatory Visit: Payer: Self-pay | Admitting: Family Medicine

## 2019-12-22 ENCOUNTER — Other Ambulatory Visit: Payer: Self-pay | Admitting: Family Medicine

## 2019-12-23 NOTE — Telephone Encounter (Signed)
Last filled 09/10/2019 #90 with no RF Last appt 11/17/2019

## 2020-01-19 ENCOUNTER — Other Ambulatory Visit: Payer: Self-pay | Admitting: Family Medicine

## 2020-01-19 ENCOUNTER — Other Ambulatory Visit: Payer: Self-pay | Admitting: *Deleted

## 2020-01-19 DIAGNOSIS — I10 Essential (primary) hypertension: Secondary | ICD-10-CM

## 2020-01-29 ENCOUNTER — Other Ambulatory Visit: Payer: Self-pay | Admitting: Family Medicine

## 2020-01-29 DIAGNOSIS — I10 Essential (primary) hypertension: Secondary | ICD-10-CM

## 2020-02-04 ENCOUNTER — Other Ambulatory Visit: Payer: Self-pay | Admitting: Family Medicine

## 2020-02-10 ENCOUNTER — Other Ambulatory Visit: Payer: Self-pay | Admitting: Family Medicine

## 2020-02-17 ENCOUNTER — Other Ambulatory Visit: Payer: Self-pay | Admitting: Family Medicine

## 2020-03-10 DIAGNOSIS — H524 Presbyopia: Secondary | ICD-10-CM | POA: Diagnosis not present

## 2020-03-10 DIAGNOSIS — H353123 Nonexudative age-related macular degeneration, left eye, advanced atrophic without subfoveal involvement: Secondary | ICD-10-CM | POA: Diagnosis not present

## 2020-03-10 DIAGNOSIS — H353212 Exudative age-related macular degeneration, right eye, with inactive choroidal neovascularization: Secondary | ICD-10-CM | POA: Diagnosis not present

## 2020-03-10 DIAGNOSIS — Z961 Presence of intraocular lens: Secondary | ICD-10-CM | POA: Diagnosis not present

## 2020-03-10 DIAGNOSIS — E119 Type 2 diabetes mellitus without complications: Secondary | ICD-10-CM | POA: Diagnosis not present

## 2020-03-11 ENCOUNTER — Other Ambulatory Visit: Payer: Self-pay | Admitting: Family Medicine

## 2020-03-15 ENCOUNTER — Other Ambulatory Visit: Payer: Self-pay

## 2020-03-15 DIAGNOSIS — E1140 Type 2 diabetes mellitus with diabetic neuropathy, unspecified: Secondary | ICD-10-CM

## 2020-03-15 MED ORDER — LANTUS SOLOSTAR 100 UNIT/ML ~~LOC~~ SOPN: PEN_INJECTOR | SUBCUTANEOUS | 1 refills | Status: DC

## 2020-03-30 ENCOUNTER — Other Ambulatory Visit: Payer: Self-pay | Admitting: Family Medicine

## 2020-03-30 DIAGNOSIS — K591 Functional diarrhea: Secondary | ICD-10-CM

## 2020-03-30 NOTE — Telephone Encounter (Signed)
Ambien - last written 12/24/2019 #90 no refills Last seen 11/17/2019

## 2020-04-02 ENCOUNTER — Other Ambulatory Visit: Payer: Self-pay | Admitting: Family Medicine

## 2020-04-08 ENCOUNTER — Ambulatory Visit: Payer: Medicare Other | Admitting: Family Medicine

## 2020-04-26 DIAGNOSIS — G6181 Chronic inflammatory demyelinating polyneuritis: Secondary | ICD-10-CM | POA: Diagnosis not present

## 2020-04-26 DIAGNOSIS — R519 Headache, unspecified: Secondary | ICD-10-CM | POA: Diagnosis not present

## 2020-04-26 DIAGNOSIS — R270 Ataxia, unspecified: Secondary | ICD-10-CM | POA: Diagnosis not present

## 2020-04-27 ENCOUNTER — Ambulatory Visit: Payer: Medicare Other | Admitting: Family Medicine

## 2020-05-06 ENCOUNTER — Encounter: Payer: Self-pay | Admitting: Family Medicine

## 2020-05-06 ENCOUNTER — Ambulatory Visit (INDEPENDENT_AMBULATORY_CARE_PROVIDER_SITE_OTHER): Payer: Medicare Other | Admitting: Family Medicine

## 2020-05-06 ENCOUNTER — Other Ambulatory Visit: Payer: Self-pay

## 2020-05-06 VITALS — BP 117/70 | HR 59 | Ht 67.0 in | Wt 208.0 lb

## 2020-05-06 DIAGNOSIS — R5383 Other fatigue: Secondary | ICD-10-CM | POA: Diagnosis not present

## 2020-05-06 DIAGNOSIS — R062 Wheezing: Secondary | ICD-10-CM | POA: Diagnosis not present

## 2020-05-06 DIAGNOSIS — E1140 Type 2 diabetes mellitus with diabetic neuropathy, unspecified: Secondary | ICD-10-CM

## 2020-05-06 DIAGNOSIS — J449 Chronic obstructive pulmonary disease, unspecified: Secondary | ICD-10-CM | POA: Diagnosis not present

## 2020-05-06 DIAGNOSIS — R779 Abnormality of plasma protein, unspecified: Secondary | ICD-10-CM

## 2020-05-06 DIAGNOSIS — R600 Localized edema: Secondary | ICD-10-CM | POA: Diagnosis not present

## 2020-05-06 DIAGNOSIS — M47816 Spondylosis without myelopathy or radiculopathy, lumbar region: Secondary | ICD-10-CM | POA: Diagnosis not present

## 2020-05-06 DIAGNOSIS — E039 Hypothyroidism, unspecified: Secondary | ICD-10-CM

## 2020-05-06 DIAGNOSIS — E639 Nutritional deficiency, unspecified: Secondary | ICD-10-CM

## 2020-05-06 DIAGNOSIS — G4733 Obstructive sleep apnea (adult) (pediatric): Secondary | ICD-10-CM | POA: Diagnosis not present

## 2020-05-06 LAB — POCT GLYCOSYLATED HEMOGLOBIN (HGB A1C): Hemoglobin A1C: 5.3 % (ref 4.0–5.6)

## 2020-05-06 MED ORDER — AMBULATORY NON FORMULARY MEDICATION: 0 refills | Status: DC

## 2020-05-06 NOTE — Patient Instructions (Signed)
Please stop your Lantus and your metformin for the next 3 months. I want to see if you diarrhea is better.

## 2020-05-06 NOTE — Progress Notes (Signed)
"  All I want to do is sleep." pt's daughter Shauna Hugh) said that this has been going on for about 3 months. She said that she told her that she hasn't felt good. Told her that "I'm not feeling my best." I have heard her wheezing but her mother said this is normal.

## 2020-05-06 NOTE — Progress Notes (Signed)
Established Patient Office Visit  Subjective:  Patient ID: [name], female    DOB: 1946-04-10  Age: 74 y.o. MRN: [id number]  CC:  Chief Complaint  Patient presents with  . low energy  . Diabetes    HPI [name] presents for fatigue x 3 mo.  Alls she wants to do is sleep.  She has gained 16 lbs in the last 7 months.  Though her daughter reports that she actually eats very little.  She does feel like she is a little bit more swollen in her hands and feet.  She does take her Lasix 1 tab daily she has not taken any extra recently.  She does report she has felt a little bit more short of breath but no increase in cough or sputum production.  She does feel that she has had a little bit more mucus and drainage in the back of her throat.  She still suffering from chronic diarrhea but this is not new.  In fact she wants to know if there is anything else that she can take besides the Lomotil.  She still having some breakthrough diarrhea even with that..  Diabetes - no hypoglycemic events. No wounds or sores that are not healing well. No increased thirst or urination. Checking glucose at home. Taking medications as prescribed without any side effects.   Hypothyroidism - Taking medication regularly in the AM away from food and vitamins, etc. No recent change to skin, hair, or energy levels.  Follow-up sleep apnea-she really has not been wearing her CPAP lately she says most the time if she tries to wear it she will end up pulling it off in the middle the night not realizing that she has pulled it off so more recently she is just quit wearing it completely.  Past Medical History:  Diagnosis Date  . Arthritis   . Charcot-Marie-Tooth disease   . COPD (chronic obstructive pulmonary disease) ([place])   . DDD (degenerative disc disease)    Lumbar and lumbosacral  . Depression   . Diabetes mellitus    type 2  . Diabetic peripheral neuropathy ([place])   . Facet syndrome, lumbar   . Gastric  ulcer    w/o hemorrhage  . Hyperlipidemia   . Hypertension   . Insomnia   . Lumbosacral root lesions, not elsewhere classified   . Neurogenic bladder   . Obesity   . Osteopenia   . Other symptoms referable to back   . Post-menopausal   . PUD (peptic ulcer disease)   . 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 Spinal stenosis, lumbar region, without neurogenic claudication   . Thoracic spondylosis without myelopathy   . Thyroid disease    hypo  . Tobacco dependence   . Unspecified hereditary and idiopathic peripheral neuropathy     Past Surgical History:  Procedure Laterality Date  . ANKLE RECONSTRUCTION     left ankle, plate and pins   . bilateral L3 medial branch block    . CHOLECYSTECTOMY    . fibroid tumor removal    . fluoroscopic L5 dorsal medial branch bloc    . RENAL ARTERY STENT  11/28/2010   left  . REPLACEMENT TOTAL KNEE      Family History  Problem Relation Age of Onset  . Stroke Mother   . Heart disease Father 8       heart attack  . Hypertension Father   . Cancer Father  lung  . Diabetes Paternal Aunt        insulin dependent    Social History   Socioeconomic History  . Marital status: Widowed    Spouse name: Not on file  . Number of children: Not on file  . Years of education: Not on file  . Highest education level: Not on file  Occupational History  . Not on file  Tobacco Use  . Smoking status: Current Every Day Smoker    Packs/day: 0.75    Years: 49.00    Pack years: 36.75    Types: Cigarettes  . Smokeless tobacco: Never Used  . Tobacco comment: pt states she is going to try the E-cig  Substance and Sexual Activity  . Alcohol use: No    Alcohol/week: 0.0 standard drinks  . Drug use: No  . Sexual activity: Not on file  Other Topics Concern  . Not on file  Social History Narrative  . Not on file   Social Determinants of Health   Financial Resource Strain:   . Difficulty of Paying Living Expenses: Not on  file  Food Insecurity:   . Worried About Charity fundraiser in the Last Year: Not on file  . Ran Out of Food in the Last Year: Not on file  Transportation Needs:   . Lack of Transportation (Medical): Not on file  . Lack of Transportation (Non-Medical): Not on file  Physical Activity:   . Days of Exercise per Week: Not on file  . Minutes of Exercise per Session: Not on file  Stress:   . Feeling of Stress : Not on file  Social Connections:   . Frequency of Communication with Friends and Family: Not on file  . Frequency of Social Gatherings with Friends and Family: Not on file  . Attends Religious Services: Not on file  . Active Member of Clubs or Organizations: Not on file  . Attends Archivist Meetings: Not on file  . Marital Status: Not on file  Intimate Partner Violence:   . Fear of Current or Ex-Partner: Not on file  . Emotionally Abused: Not on file  . Physically Abused: Not on file  . Sexually Abused: Not on file    Outpatient Medications Prior to Visit  Medication Sig Dispense Refill  . mycophenolate (CELLCEPT) 500 MG tablet Take by mouth.  5  . albuterol (ACCUNEB) 1.25 MG/3ML nebulizer solution Take 3 mLs (1.25 mg total) by nebulization every 6 (six) hours as needed for wheezing or shortness of breath. 75 mL 12  . alendronate (FOSAMAX) 70 MG tablet TAKE 1 TABLET BY MOUTH EVERY 7 DAYS. TAKE IN THE MORNING WITH A FULL GLASS OF WATER ON AN EMPTY STOMACH. DO NOT TAKE OTHER FOOD OR DRINK OR LIE DOWN 12 tablet 1  . AMBULATORY NON FORMULARY MEDICATION Medication Name: Auto Pap set to 5cm water pressure with 2 liter nocturnal oxygen as she desats as well. AHI 21.  After one week fax download form machine so we can bettter set her CPAP.  Fax to [facility]. 1 vial 0  . AMBULATORY NON FORMULARY MEDICATION Medication Name: Nebulizer machine with equipment.  Diagnosis COPD.  Patient is primarily homebound.  Is faxed to [employer]. She is an established patient. 1 Units 0  . aspirin  EC 81 MG tablet Take 81 mg by mouth daily.    . blood glucose meter kit and supplies KIT Dispense based on patient and insurance preference. Use up to four times daily as  directed Dx E11.40 1 each 0  . clopidogrel (PLAVIX) 75 MG tablet TAKE ONE TABLET BY MOUTH EVERY DAY 30 tablet 11  . dicyclomine (BENTYL) 20 MG tablet TAKE ONE TABLET BY MOUTH THREE TIMES DAILY AS NEEDED FOR SPASMS 90 tablet 2  . diphenoxylate-atropine (LOMOTIL) 2.5-0.025 MG tablet TAKE ONE OR TWO TABLETS BY MOUTH TWICE DAILY AS NEEDED FOR DIARRHEA OR LOOSE STOOLS 60 tablet 5  . FLUoxetine (PROZAC) 40 MG capsule Take 2 capsules (80 mg total) by mouth daily. 180 capsule 2  . furosemide (LASIX) 40 MG tablet Take 1 or 2 tablets by mouth every morning/LABS FOR REFILLS. 90 tablet 0  . gabapentin (NEURONTIN) 400 MG capsule Take 400 mg with breakfast, lunch and dinner. Take 800 mg before bed    . GAMUNEX-C 10 GM/100ML SOLN     . GAMUNEX-C 20 GM/200ML SOLN     . HYDROcodone-acetaminophen (NORCO) 10-325 MG per tablet Take 1 tablet by mouth every 6 (six) hours as needed. Do not take more that 5 tabs a day    . insulin glargine (LANTUS SOLOSTAR) 100 UNIT/ML Solostar Pen Inject 10-15 Units into the skin at bedtime. 15 mL 1  . Lancets MISC by Does not apply route.      . levalbuterol (XOPENEX) 0.63 MG/3ML nebulizer solution Take 3 mLs (0.63 mg total) by nebulization every 8 (eight) hours as needed for wheezing or shortness of breath. 60 mL 3  . levothyroxine (SYNTHROID) 125 MCG tablet Take 1 tablet (125 mcg total) by mouth daily. 30 tablet 2  . lidocaine (XYLOCAINE) 5 % ointment Apply 1 application topically daily. 2 hours before skin procedure. 35.44 g 11  . meclizine (ANTIVERT) 25 MG tablet Take 1 tablet (25 mg total) by mouth 3 (three) times daily as needed for dizziness. 30 tablet 0  . metFORMIN (GLUCOPHAGE) 500 MG tablet TAKE ONE TABLET BY MOUTH THREE TIMES DAILY 270 tablet 1  . metoprolol tartrate (LOPRESSOR) 50 MG tablet Take 1 tablet  (50 mg total) by mouth 2 (two) times daily. 180 tablet 3  . Multiple Vitamins-Minerals (PRESERVISION AREDS 2) CAPS     . nitroGLYCERIN (NITROSTAT) 0.4 MG SL tablet Place 1 tablet (0.4 mg total) under the tongue every 5 (five) minutes as needed. 10 tablet 2  . PRODIGY NO CODING BLOOD GLUC test strip USE AS DIRECTED 100 each 0  . simvastatin (ZOCOR) 40 MG tablet TAKE ONE TABLET BY MOUTH EVERY DAY AT SIX IN THE EVENING 90 tablet 3  . TRELEGY ELLIPTA 100-62.5-25 MCG/INH AEPB Inhale 1 puff into the lungs every morning. 60 each 5  . UNIFINE PENTIPS 32G X 4 MM MISC USE WHEN INJECTING INSULIN DAILY 100 each 6  . zolpidem (AMBIEN) 5 MG tablet Take 1 tablet (5 mg total) by mouth at bedtime. 90 tablet 0   No facility-administered medications prior to visit.    Allergies  Allergen Reactions  . Varenicline Nausea And Vomiting    ROS Review of Systems    Objective:    Physical Exam Constitutional:      Appearance: She is well-developed.  HENT:     Head: Normocephalic and atraumatic.  Cardiovascular:     Rate and Rhythm: Normal rate and regular rhythm.     Heart sounds: Normal heart sounds.  Pulmonary:     Effort: Pulmonary effort is normal.     Breath sounds: Wheezing present.     Comments: Diffuse expiratory wheezing. Skin:    General: Skin is warm and dry.  Neurological:     Mental Status: She is alert and oriented to person, place, and time.  Psychiatric:        Behavior: Behavior normal.     BP 117/70   Pulse (!) 59   Ht $R'5\' 7"'rt$  (1.702 m)   Wt 208 lb (94.3 kg)   SpO2 96%   BMI 32.58 kg/m  Wt Readings from Last 3 Encounters:  05/06/20 208 lb (94.3 kg)  09/10/19 192 lb (87.1 kg)  08/05/19 191 lb (86.6 kg)     Health Maintenance Due  Topic Date Due  . URINE MICROALBUMIN  12/05/2017  . INFLUENZA VACCINE  03/07/2020  . FOOT EXAM  05/01/2020    There are no preventive care reminders to display for this patient.  Lab Results  Component Value Date   TSH 1.61  05/06/2020   Lab Results  Component Value Date   WBC 7.4 05/06/2020   HGB 13.2 05/06/2020   HCT 38.5 05/06/2020   MCV 97.5 05/06/2020   PLT 256 05/06/2020   Lab Results  Component Value Date   NA 137 05/06/2020   K 4.7 05/06/2020   CO2 28 05/06/2020   GLUCOSE 121 (H) 05/06/2020   BUN 23 05/06/2020   CREATININE 1.09 (H) 05/06/2020   BILITOT 1.2 05/06/2020   ALKPHOS 51 09/01/2015   AST 28 05/06/2020   ALT 17 05/06/2020   PROT 8.0 05/06/2020   ALBUMIN 3.4 (L) 09/01/2015   CALCIUM 9.4 05/06/2020   Lab Results  Component Value Date   CHOL 128 09/01/2015   Lab Results  Component Value Date   HDL 42 (L) 09/01/2015   Lab Results  Component Value Date   LDLCALC 61 09/01/2015   Lab Results  Component Value Date   TRIG 124 09/01/2015   Lab Results  Component Value Date   CHOLHDL 3.0 09/01/2015   Lab Results  Component Value Date   HGBA1C 5.3 05/06/2020      Assessment & Plan:   Problem List Items Addressed This Visit      Respiratory   OSA (obstructive sleep apnea)    She has severe sleep apnea is not currently wearing her CPAP strongly encouraged her to put it back on even if she just wears it for a few hours a night before pulling it off.  We will have a great way around that unless she has a bed partner who can help her but I would encourage her to wear at least for a few hours.  If she feels like we need to adjust something such as the pressure etc. then please let me know.      Relevant Orders   COMPLETE METABOLIC PANEL WITH GFR (Completed)   TSH (Completed)   CBC with Differential/Platelet (Completed)   B12   B Nat Peptide (Completed)   COPD (chronic obstructive pulmonary disease) with chronic bronchitis ([place])    Her daughter notes that she has been audibly hearing more wheezing lately even though she does not complain of increased sputum production or cough she has had some increase shortness of breath with a rapid weight gain I am concerned that she is  a little volume overloaded in fact she has some pitting edema around her ankles today but have her increase her Lasix to 2 tabs daily for the next 3 days she does not have a home scale to weigh herself.  We will check a BNP to rule out heart failure.  Endocrine   Hypothyroidism    Due to recheck TSH.  Last one look great at 2.7 back in December but with recent weight gain I think we need to check it to make sure that she is not being undertreated currently.      Diabetes mellitus with neuropathy (HCC) - Primary    A1c looks phenomenal today at 5.3 because her A1c is so low and because of the fatigue I am actually gone to have her completely hold her insulin and her Metformin also because of the diarrhea just for the next 3 months to see if she feels better I suspect her A1c will probably go up into the 6 range but as long as it is well controlled, if she is feeling better then we will continue with that regimen.      Relevant Orders   POCT glycosylated hemoglobin (Hb A1C) (Completed)   COMPLETE METABOLIC PANEL WITH GFR (Completed)   TSH (Completed)   CBC with Differential/Platelet (Completed)   B12   B Nat Peptide (Completed)   Urine Microalbumin w/creat. ratio   Sedimentation rate   C-reactive protein     Musculoskeletal and Integument   Lumbar spondylosis   Relevant Medications   AMBULATORY NON FORMULARY MEDICATION    Other Visit Diagnoses    Fatigue, unspecified type       Relevant Orders   COMPLETE METABOLIC PANEL WITH GFR (Completed)   TSH (Completed)   CBC with Differential/Platelet (Completed)   B12   B Nat Peptide (Completed)   Urine Microalbumin w/creat. ratio   Sedimentation rate   C-reactive protein   Wheezing       Relevant Orders   COMPLETE METABOLIC PANEL WITH GFR (Completed)   TSH (Completed)   CBC with Differential/Platelet (Completed)   B12   B Nat Peptide (Completed)   Nutritional deficiency       Relevant Orders   COMPLETE METABOLIC PANEL WITH  GFR (Completed)   TSH (Completed)   CBC with Differential/Platelet (Completed)   B12   B Nat Peptide (Completed)   Lower extremity edema          Fatigue-we will evaluate for nutritional deficiencies she has had some deficiencies in the past.  Also suspect volume overload on exam today as her can have a bump up her Lasix to 2 tabs daily for the next 3 days.  We will also check a BNP, to evaluate for the possibility of heart failure.  Check thyroid as her dose may need to be adjusted.  She reports she is taking her medication regularly..  Meds ordered this encounter  Medications  . AMBULATORY NON FORMULARY MEDICATION    Sig: Medication Name: Rollator Dx:    Dispense:  1 each    Refill:  0    Follow-up: Return in about 3 months (around 08/05/2020) for Diabetes follow-up.    [name], MD

## 2020-05-07 ENCOUNTER — Encounter: Payer: Self-pay | Admitting: Family Medicine

## 2020-05-07 LAB — COMPLETE METABOLIC PANEL WITH GFR
AG Ratio: 0.9 (calc) — ABNORMAL LOW (ref 1.0–2.5)
ALT: 17 U/L (ref 6–29)
AST: 28 U/L (ref 10–35)
Albumin: 3.7 g/dL (ref 3.6–5.1)
Alkaline phosphatase (APISO): 52 U/L (ref 37–153)
BUN/Creatinine Ratio: 21 (calc) (ref 6–22)
BUN: 23 mg/dL (ref 7–25)
CO2: 28 mmol/L (ref 20–32)
Calcium: 9.4 mg/dL (ref 8.6–10.4)
Chloride: 99 mmol/L (ref 98–110)
Creat: 1.09 mg/dL — ABNORMAL HIGH (ref 0.60–0.93)
GFR, Est African American: 58 mL/min/{1.73_m2} — ABNORMAL LOW (ref >=60)
GFR, Est Non African American: 50 mL/min/{1.73_m2} — ABNORMAL LOW (ref >=60)
Globulin: 4.3 g/dL (calc) — ABNORMAL HIGH (ref 1.9–3.7)
Glucose, Bld: 121 mg/dL — ABNORMAL HIGH (ref 65–99)
Potassium: 4.7 mmol/L (ref 3.5–5.3)
Sodium: 137 mmol/L (ref 135–146)
Total Bilirubin: 1.2 mg/dL (ref 0.2–1.2)
Total Protein: 8 g/dL (ref 6.1–8.1)

## 2020-05-07 LAB — CBC WITH DIFFERENTIAL/PLATELET
Absolute Monocytes: 592 cells/uL (ref 200–950)
Basophils Absolute: 59 cells/uL (ref 0–200)
Basophils Relative: 0.8 %
Eosinophils Absolute: 133 cells/uL (ref 15–500)
Eosinophils Relative: 1.8 %
HCT: 38.5 % (ref 35.0–45.0)
Hemoglobin: 13.2 g/dL (ref 11.7–15.5)
Lymphs Abs: 1539 cells/uL (ref 850–3900)
MCH: 33.4 pg — ABNORMAL HIGH (ref 27.0–33.0)
MCHC: 34.3 g/dL (ref 32.0–36.0)
MCV: 97.5 fL (ref 80.0–100.0)
MPV: 11.6 fL (ref 7.5–12.5)
Monocytes Relative: 8 %
Neutro Abs: 5076 cells/uL (ref 1500–7800)
Neutrophils Relative %: 68.6 %
Platelets: 256 10*3/uL (ref 140–400)
RBC: 3.95 10*6/uL (ref 3.80–5.10)
RDW: 13.6 % (ref 11.0–15.0)
Total Lymphocyte: 20.8 %
WBC: 7.4 10*3/uL (ref 3.8–10.8)

## 2020-05-07 LAB — BRAIN NATRIURETIC PEPTIDE: Brain Natriuretic Peptide: 107 pg/mL — ABNORMAL HIGH (ref <100)

## 2020-05-07 LAB — TSH: TSH: 1.61 mIU/L (ref 0.40–4.50)

## 2020-05-07 NOTE — Assessment & Plan Note (Signed)
She has severe sleep apnea is not currently wearing her CPAP strongly encouraged her to put it back on even if she just wears it for a few hours a night before pulling it off.  We will have a great way around that unless she has a bed partner who can help her but I would encourage her to wear at least for a few hours.  If she feels like we need to adjust something such as the pressure etc. then please let me know.

## 2020-05-07 NOTE — Assessment & Plan Note (Signed)
Due to recheck TSH.  Last one look great at 2.7 back in December but with recent weight gain I think we need to check it to make sure that she is not being undertreated currently.

## 2020-05-07 NOTE — Assessment & Plan Note (Signed)
A1c looks phenomenal today at 5.3 because her A1c is so low and because of the fatigue I am actually gone to have her completely hold her insulin and her Metformin also because of the diarrhea just for the next 3 months to see if she feels better I suspect her A1c will probably go up into the 6 range but as long as it is well controlled, if she is feeling better then we will continue with that regimen.

## 2020-05-07 NOTE — Assessment & Plan Note (Signed)
Her daughter notes that she has been audibly hearing more wheezing lately even though she does not complain of increased sputum production or cough she has had some increase shortness of breath with a rapid weight gain I am concerned that she is a little volume overloaded in fact she has some pitting edema around her ankles today but have her increase her Lasix to 2 tabs daily for the next 3 days she does not have a home scale to weigh herself.  We will check a BNP to rule out heart failure.

## 2020-05-08 ENCOUNTER — Other Ambulatory Visit: Payer: Self-pay | Admitting: Family Medicine

## 2020-05-11 NOTE — Addendum Note (Signed)
Addended by: [name] D on: 05/11/2020 08:01 AM   Modules accepted: Orders

## 2020-05-12 ENCOUNTER — Other Ambulatory Visit: Payer: Self-pay

## 2020-05-12 NOTE — Progress Notes (Signed)
Opened in error

## 2020-05-17 ENCOUNTER — Telehealth: Payer: Self-pay

## 2020-05-17 NOTE — Telephone Encounter (Signed)
[name] called and left a message stating her blood sugars have been elevated. 240-280 mg/dl. I called, no answer. I will try again later.

## 2020-05-19 NOTE — Telephone Encounter (Signed)
Please verify Lantus dose.  Is she doing 10 or 15?

## 2020-05-19 NOTE — Telephone Encounter (Signed)
[name] states the diarrhea has resolved. Her fasting blood sugar yesterday was 371 mg/dl and 251 mg/dl today.

## 2020-05-20 NOTE — Telephone Encounter (Signed)
She states she has been holding the Lantus and Metformin due to the diarrhea.

## 2020-05-20 NOTE — Telephone Encounter (Signed)
Patient advised.

## 2020-05-20 NOTE — Telephone Encounter (Signed)
Metformin is the one that can cause diarrhea so only wanted her to hold the Metformin she needs to restart her insulin.  In fact she will probably need more since she is no longer taking the Metformin.

## 2020-05-24 ENCOUNTER — Other Ambulatory Visit: Payer: Self-pay | Admitting: Family Medicine

## 2020-05-24 DIAGNOSIS — E0840 Diabetes mellitus due to underlying condition with diabetic neuropathy, unspecified: Secondary | ICD-10-CM

## 2020-05-24 DIAGNOSIS — Z794 Long term (current) use of insulin: Secondary | ICD-10-CM

## 2020-05-26 ENCOUNTER — Other Ambulatory Visit: Payer: Self-pay | Admitting: Family Medicine

## 2020-06-01 ENCOUNTER — Encounter: Payer: Self-pay | Admitting: Family Medicine

## 2020-06-01 ENCOUNTER — Telehealth (INDEPENDENT_AMBULATORY_CARE_PROVIDER_SITE_OTHER): Payer: Medicare Other | Admitting: Family Medicine

## 2020-06-01 DIAGNOSIS — J029 Acute pharyngitis, unspecified: Secondary | ICD-10-CM

## 2020-06-01 MED ORDER — PENICILLIN V POTASSIUM 500 MG PO TABS: 500.0000 mg | ORAL_TABLET | Freq: Two times a day (BID) | ORAL | 0 refills | Status: DC

## 2020-06-01 NOTE — Progress Notes (Signed)
Sore throat x 2 days. Sxs are worse as the day progresses.    She does c/o Fever,headache,chills and sweats. Gargling with listerine which has helped her some. She hasn't tried anything else.     She is up to 12 U on the Lantus and BS range between 140-160

## 2020-06-01 NOTE — Progress Notes (Signed)
Virtual Visit via Telephone Note  I connected with [name] on 06/01/20 at  1:20 PM EDT by telephone and verified that I am speaking with the correct person using two identifiers.   I discussed the limitations, risks, security and privacy concerns of performing an evaluation and management service by telephone and the availability of in person appointments. I also discussed with the patient that there may be a patient responsible charge related to this service. The patient expressed understanding and agreed to proceed.  Patient location: at home.   Provider loccation: In office   Subjective:    CC:   HPI:  Sore throat x 2 days. Sxs are worse as the day progresses. Might have blisters on the back of her throat. Painful to swallow.  No new SOB or congestion. No rash on body, palms or soles. She does c/o Fever, headache, chills and sweats. Gargling with listerine which has helped her some. She hasn't tried anything else. friend visited and had a URI.  Her friend is already feeling better and did not get tested for Covid.  She has chronic cough and shortness of breath from her COPD but does not feel like it is worse than her baseline.  She takes Mucinex regularly. She cannot get transportation here and so is doing the virtual visit..   She is up to 12 U on the Lantus and BS range between 140-160    Past medical history, Surgical history, Family history not pertinant except as noted below, Social history, Allergies, and medications have been entered into the medical record, reviewed, and corrections made.   Review of Systems: No fevers, chills, night sweats, weight loss, chest pain, or shortness of breath.   Objective:    General: Speaking clearly in complete sentences without any shortness of breath.  Alert and oriented x3.  Normal judgment. No apparent acute distress.    Impression and Recommendations:    Pharyngitis-unclear etiology without doing exam she is not really able  to look on her own at home because of her poor vision and she is not able to get transportation here this week to get her swabbed for strep throat and/or Covid.  She says that she feels some "pitting" at the back of her throat so I am wondering if it could actually be blisters in which case this would be more likely to be viral versus bacterial.  We discussed the options.  We will send over prescription for penicillin to treat for possible strep throat.  If she has a pretty robust response within 24 to 48 hours then continue the antibiotic.  If she does not have a robust response with significant improvement in this is likely viral in which case I encouraged her to stop the antibiotic and continue with her Listerine gargles.     I discussed the assessment and treatment plan with the patient. The patient was provided an opportunity to ask questions and all were answered. The patient agreed with the plan and demonstrated an understanding of the instructions.   The patient was advised to call back or seek an in-person evaluation if the symptoms worsen or if the condition fails to improve as anticipated.  I provided 21 minutes of non-face-to-face time during this encounter.   [name], MD

## 2020-06-05 ENCOUNTER — Other Ambulatory Visit: Payer: Self-pay | Admitting: Family Medicine

## 2020-06-07 ENCOUNTER — Telehealth: Payer: Self-pay

## 2020-06-07 DIAGNOSIS — E119 Type 2 diabetes mellitus without complications: Secondary | ICD-10-CM | POA: Diagnosis not present

## 2020-06-07 DIAGNOSIS — J80 Acute respiratory distress syndrome: Secondary | ICD-10-CM | POA: Diagnosis not present

## 2020-06-07 DIAGNOSIS — R059 Cough, unspecified: Secondary | ICD-10-CM | POA: Diagnosis not present

## 2020-06-07 DIAGNOSIS — I10 Essential (primary) hypertension: Secondary | ICD-10-CM | POA: Diagnosis not present

## 2020-06-07 DIAGNOSIS — R062 Wheezing: Secondary | ICD-10-CM | POA: Diagnosis not present

## 2020-06-07 DIAGNOSIS — J209 Acute bronchitis, unspecified: Secondary | ICD-10-CM | POA: Diagnosis not present

## 2020-06-07 DIAGNOSIS — R0602 Shortness of breath: Secondary | ICD-10-CM | POA: Diagnosis not present

## 2020-06-07 DIAGNOSIS — Z20822 Contact with and (suspected) exposure to covid-19: Secondary | ICD-10-CM | POA: Diagnosis not present

## 2020-06-07 DIAGNOSIS — J40 Bronchitis, not specified as acute or chronic: Secondary | ICD-10-CM | POA: Diagnosis not present

## 2020-06-07 DIAGNOSIS — Z209 Contact with and (suspected) exposure to unspecified communicable disease: Secondary | ICD-10-CM | POA: Diagnosis not present

## 2020-06-07 DIAGNOSIS — E785 Hyperlipidemia, unspecified: Secondary | ICD-10-CM | POA: Diagnosis not present

## 2020-06-07 DIAGNOSIS — F17210 Nicotine dependence, cigarettes, uncomplicated: Secondary | ICD-10-CM | POA: Diagnosis not present

## 2020-06-07 DIAGNOSIS — Z888 Allergy status to other drugs, medicaments and biological substances status: Secondary | ICD-10-CM | POA: Diagnosis not present

## 2020-06-07 DIAGNOSIS — J986 Disorders of diaphragm: Secondary | ICD-10-CM | POA: Diagnosis not present

## 2020-06-07 DIAGNOSIS — R0902 Hypoxemia: Secondary | ICD-10-CM | POA: Diagnosis not present

## 2020-06-07 DIAGNOSIS — Z79899 Other long term (current) drug therapy: Secondary | ICD-10-CM | POA: Diagnosis not present

## 2020-06-07 NOTE — Telephone Encounter (Signed)
[name] called and left a message. She states the PCN is helping with her sore throat. She states the cough is not getting better. She states the cough is keeping her up at night. She would like a cough syrup.    I called [name] and she sounds terrible. She was coughing and breathless. I advised her she needed to go to the ED. She states she has nobody that can take her to the ED. I advised I would call EMS to her house. She states she wouldn't have a ride home.

## 2020-06-07 NOTE — Telephone Encounter (Signed)
Yes, she needs to go to the emergency department.  Please call EMS to come get her if she cannot get a ride.

## 2020-06-07 NOTE — Telephone Encounter (Signed)
I called patient to advise. She wanted me to call EMS. EMS called and on route.

## 2020-06-11 ENCOUNTER — Telehealth: Payer: Self-pay

## 2020-06-11 MED ORDER — HYDROCODONE-HOMATROPINE 5-1.5 MG/5ML PO SYRP: 5.0000 mL | ORAL_SOLUTION | Freq: Every evening | ORAL | 0 refills | Status: DC | PRN

## 2020-06-11 MED ORDER — HYDROCOD POLST-CPM POLST ER 10-8 MG/5ML PO SUER
5.0000 mL | Freq: Every evening | ORAL | 0 refills | Status: DC | PRN
Start: 2020-06-11 — End: 2020-09-13

## 2020-06-11 NOTE — Telephone Encounter (Signed)
OK, new rx sent.  

## 2020-06-11 NOTE — Telephone Encounter (Signed)
Med sent.

## 2020-06-11 NOTE — Telephone Encounter (Signed)
I don't have anything to offer she is already on hydrocodone.  Unless she is no longer on this.

## 2020-06-11 NOTE — Telephone Encounter (Signed)
Patient advised.

## 2020-06-11 NOTE — Telephone Encounter (Signed)
The pharmacy called and states they are out of the Hycodan. They do have Tussinex. Please advise.   Sorry.

## 2020-06-11 NOTE — Telephone Encounter (Signed)
[name] was seen in the ED for cough. She was treated with Z-pak and Tussin DM. She is requesting another cough medication. Please advise.   guaiFENesin-dextromethorphan (ALTARUSSIN,TUSSIN DM) 100-10 mg/86mL liquid  Take 10 mLs by mouth 3 (three) times a day as needed for Cough for up to 10 days. 118 mL  0 06/07/2020 06/17/2020  azithromycin (ZITHROMAX Z-PAK) 250 mg tablet  Indications: Community Acquired Pneumonia Take 2 tablets on day 1, take 1 tablet on days 2 through 5 6 tablet  0 06/07/2020 06/11/2020

## 2020-06-11 NOTE — Telephone Encounter (Signed)
She states she hasn't been on Hydrocodone for months. Please advise.

## 2020-06-11 NOTE — Addendum Note (Signed)
Addended by: [name] D on: 06/11/2020 03:58 PM   Modules accepted: Orders

## 2020-06-12 DIAGNOSIS — I491 Atrial premature depolarization: Secondary | ICD-10-CM | POA: Diagnosis not present

## 2020-06-12 DIAGNOSIS — J189 Pneumonia, unspecified organism: Secondary | ICD-10-CM | POA: Diagnosis not present

## 2020-06-12 DIAGNOSIS — R52 Pain, unspecified: Secondary | ICD-10-CM | POA: Diagnosis not present

## 2020-06-12 DIAGNOSIS — E871 Hypo-osmolality and hyponatremia: Secondary | ICD-10-CM | POA: Diagnosis not present

## 2020-06-12 DIAGNOSIS — E785 Hyperlipidemia, unspecified: Secondary | ICD-10-CM | POA: Diagnosis not present

## 2020-06-12 DIAGNOSIS — R7989 Other specified abnormal findings of blood chemistry: Secondary | ICD-10-CM | POA: Diagnosis not present

## 2020-06-12 DIAGNOSIS — J156 Pneumonia due to other aerobic Gram-negative bacteria: Secondary | ICD-10-CM | POA: Diagnosis not present

## 2020-06-12 DIAGNOSIS — G4733 Obstructive sleep apnea (adult) (pediatric): Secondary | ICD-10-CM | POA: Diagnosis not present

## 2020-06-12 DIAGNOSIS — J159 Unspecified bacterial pneumonia: Secondary | ICD-10-CM | POA: Diagnosis not present

## 2020-06-12 DIAGNOSIS — R0902 Hypoxemia: Secondary | ICD-10-CM | POA: Diagnosis not present

## 2020-06-12 DIAGNOSIS — I214 Non-ST elevation (NSTEMI) myocardial infarction: Secondary | ICD-10-CM | POA: Diagnosis not present

## 2020-06-12 DIAGNOSIS — R918 Other nonspecific abnormal finding of lung field: Secondary | ICD-10-CM | POA: Diagnosis not present

## 2020-06-12 DIAGNOSIS — J986 Disorders of diaphragm: Secondary | ICD-10-CM | POA: Diagnosis not present

## 2020-06-12 DIAGNOSIS — I313 Pericardial effusion (noninflammatory): Secondary | ICD-10-CM | POA: Diagnosis not present

## 2020-06-12 DIAGNOSIS — I10 Essential (primary) hypertension: Secondary | ICD-10-CM | POA: Diagnosis not present

## 2020-06-12 DIAGNOSIS — G9009 Other idiopathic peripheral autonomic neuropathy: Secondary | ICD-10-CM | POA: Diagnosis not present

## 2020-06-12 DIAGNOSIS — R0602 Shortness of breath: Secondary | ICD-10-CM | POA: Diagnosis not present

## 2020-06-12 DIAGNOSIS — F32A Depression, unspecified: Secondary | ICD-10-CM | POA: Diagnosis not present

## 2020-06-12 DIAGNOSIS — Z720 Tobacco use: Secondary | ICD-10-CM | POA: Diagnosis not present

## 2020-06-12 DIAGNOSIS — J9621 Acute and chronic respiratory failure with hypoxia: Secondary | ICD-10-CM | POA: Diagnosis not present

## 2020-06-12 DIAGNOSIS — E039 Hypothyroidism, unspecified: Secondary | ICD-10-CM | POA: Diagnosis not present

## 2020-06-12 DIAGNOSIS — R000 Tachycardia, unspecified: Secondary | ICD-10-CM | POA: Diagnosis not present

## 2020-06-12 DIAGNOSIS — R069 Unspecified abnormalities of breathing: Secondary | ICD-10-CM | POA: Diagnosis not present

## 2020-06-12 DIAGNOSIS — J441 Chronic obstructive pulmonary disease with (acute) exacerbation: Secondary | ICD-10-CM | POA: Diagnosis not present

## 2020-06-12 DIAGNOSIS — J440 Chronic obstructive pulmonary disease with acute lower respiratory infection: Secondary | ICD-10-CM | POA: Diagnosis not present

## 2020-06-12 DIAGNOSIS — I080 Rheumatic disorders of both mitral and aortic valves: Secondary | ICD-10-CM | POA: Diagnosis not present

## 2020-06-13 DIAGNOSIS — J156 Pneumonia due to other aerobic Gram-negative bacteria: Secondary | ICD-10-CM | POA: Diagnosis present

## 2020-06-13 DIAGNOSIS — J440 Chronic obstructive pulmonary disease with acute lower respiratory infection: Secondary | ICD-10-CM | POA: Diagnosis present

## 2020-06-13 DIAGNOSIS — J189 Pneumonia, unspecified organism: Secondary | ICD-10-CM | POA: Diagnosis not present

## 2020-06-13 DIAGNOSIS — E039 Hypothyroidism, unspecified: Secondary | ICD-10-CM | POA: Diagnosis not present

## 2020-06-13 DIAGNOSIS — R791 Abnormal coagulation profile: Secondary | ICD-10-CM | POA: Diagnosis present

## 2020-06-13 DIAGNOSIS — K589 Irritable bowel syndrome without diarrhea: Secondary | ICD-10-CM | POA: Diagnosis present

## 2020-06-13 DIAGNOSIS — G6181 Chronic inflammatory demyelinating polyneuritis: Secondary | ICD-10-CM | POA: Diagnosis present

## 2020-06-13 DIAGNOSIS — E871 Hypo-osmolality and hyponatremia: Secondary | ICD-10-CM | POA: Diagnosis not present

## 2020-06-13 DIAGNOSIS — Z888 Allergy status to other drugs, medicaments and biological substances status: Secondary | ICD-10-CM | POA: Diagnosis not present

## 2020-06-13 DIAGNOSIS — F32A Depression, unspecified: Secondary | ICD-10-CM | POA: Diagnosis present

## 2020-06-13 DIAGNOSIS — Z96659 Presence of unspecified artificial knee joint: Secondary | ICD-10-CM | POA: Diagnosis present

## 2020-06-13 DIAGNOSIS — Z720 Tobacco use: Secondary | ICD-10-CM | POA: Diagnosis not present

## 2020-06-13 DIAGNOSIS — I10 Essential (primary) hypertension: Secondary | ICD-10-CM | POA: Diagnosis present

## 2020-06-13 DIAGNOSIS — G9009 Other idiopathic peripheral autonomic neuropathy: Secondary | ICD-10-CM | POA: Diagnosis not present

## 2020-06-13 DIAGNOSIS — Z6833 Body mass index (BMI) 33.0-33.9, adult: Secondary | ICD-10-CM | POA: Diagnosis not present

## 2020-06-13 DIAGNOSIS — J159 Unspecified bacterial pneumonia: Secondary | ICD-10-CM | POA: Diagnosis present

## 2020-06-13 DIAGNOSIS — E669 Obesity, unspecified: Secondary | ICD-10-CM | POA: Diagnosis present

## 2020-06-13 DIAGNOSIS — R079 Chest pain, unspecified: Secondary | ICD-10-CM | POA: Diagnosis not present

## 2020-06-13 DIAGNOSIS — J9621 Acute and chronic respiratory failure with hypoxia: Secondary | ICD-10-CM | POA: Diagnosis present

## 2020-06-13 DIAGNOSIS — J441 Chronic obstructive pulmonary disease with (acute) exacerbation: Secondary | ICD-10-CM | POA: Diagnosis present

## 2020-06-13 DIAGNOSIS — E876 Hypokalemia: Secondary | ICD-10-CM | POA: Diagnosis present

## 2020-06-13 DIAGNOSIS — F17210 Nicotine dependence, cigarettes, uncomplicated: Secondary | ICD-10-CM | POA: Diagnosis present

## 2020-06-13 DIAGNOSIS — I214 Non-ST elevation (NSTEMI) myocardial infarction: Secondary | ICD-10-CM | POA: Diagnosis present

## 2020-06-13 DIAGNOSIS — E1142 Type 2 diabetes mellitus with diabetic polyneuropathy: Secondary | ICD-10-CM | POA: Diagnosis present

## 2020-06-13 DIAGNOSIS — D638 Anemia in other chronic diseases classified elsewhere: Secondary | ICD-10-CM | POA: Diagnosis present

## 2020-06-13 DIAGNOSIS — R0602 Shortness of breath: Secondary | ICD-10-CM | POA: Diagnosis not present

## 2020-06-13 DIAGNOSIS — Z9989 Dependence on other enabling machines and devices: Secondary | ICD-10-CM | POA: Diagnosis not present

## 2020-06-13 DIAGNOSIS — E785 Hyperlipidemia, unspecified: Secondary | ICD-10-CM | POA: Diagnosis not present

## 2020-06-13 DIAGNOSIS — I313 Pericardial effusion (noninflammatory): Secondary | ICD-10-CM | POA: Diagnosis present

## 2020-06-13 DIAGNOSIS — G4733 Obstructive sleep apnea (adult) (pediatric): Secondary | ICD-10-CM | POA: Diagnosis not present

## 2020-06-13 DIAGNOSIS — R778 Other specified abnormalities of plasma proteins: Secondary | ICD-10-CM | POA: Diagnosis not present

## 2020-06-13 DIAGNOSIS — R7989 Other specified abnormal findings of blood chemistry: Secondary | ICD-10-CM | POA: Diagnosis not present

## 2020-06-14 DIAGNOSIS — J441 Chronic obstructive pulmonary disease with (acute) exacerbation: Secondary | ICD-10-CM | POA: Diagnosis not present

## 2020-06-14 DIAGNOSIS — J189 Pneumonia, unspecified organism: Secondary | ICD-10-CM | POA: Diagnosis not present

## 2020-06-14 DIAGNOSIS — Z720 Tobacco use: Secondary | ICD-10-CM | POA: Diagnosis not present

## 2020-06-14 DIAGNOSIS — E871 Hypo-osmolality and hyponatremia: Secondary | ICD-10-CM | POA: Diagnosis not present

## 2020-06-14 DIAGNOSIS — R778 Other specified abnormalities of plasma proteins: Secondary | ICD-10-CM | POA: Diagnosis not present

## 2020-06-14 DIAGNOSIS — R7989 Other specified abnormal findings of blood chemistry: Secondary | ICD-10-CM | POA: Diagnosis not present

## 2020-06-14 DIAGNOSIS — R079 Chest pain, unspecified: Secondary | ICD-10-CM | POA: Diagnosis not present

## 2020-06-14 DIAGNOSIS — F32A Depression, unspecified: Secondary | ICD-10-CM | POA: Diagnosis not present

## 2020-06-14 DIAGNOSIS — I10 Essential (primary) hypertension: Secondary | ICD-10-CM | POA: Diagnosis not present

## 2020-06-14 DIAGNOSIS — E039 Hypothyroidism, unspecified: Secondary | ICD-10-CM | POA: Diagnosis not present

## 2020-06-14 DIAGNOSIS — J9621 Acute and chronic respiratory failure with hypoxia: Secondary | ICD-10-CM | POA: Diagnosis not present

## 2020-06-14 DIAGNOSIS — G9009 Other idiopathic peripheral autonomic neuropathy: Secondary | ICD-10-CM | POA: Diagnosis not present

## 2020-06-14 DIAGNOSIS — G4733 Obstructive sleep apnea (adult) (pediatric): Secondary | ICD-10-CM | POA: Diagnosis not present

## 2020-06-14 DIAGNOSIS — E785 Hyperlipidemia, unspecified: Secondary | ICD-10-CM | POA: Diagnosis not present

## 2020-06-15 DIAGNOSIS — G9009 Other idiopathic peripheral autonomic neuropathy: Secondary | ICD-10-CM | POA: Diagnosis not present

## 2020-06-15 DIAGNOSIS — G4733 Obstructive sleep apnea (adult) (pediatric): Secondary | ICD-10-CM | POA: Diagnosis not present

## 2020-06-15 DIAGNOSIS — F32A Depression, unspecified: Secondary | ICD-10-CM | POA: Diagnosis not present

## 2020-06-15 DIAGNOSIS — E039 Hypothyroidism, unspecified: Secondary | ICD-10-CM | POA: Diagnosis not present

## 2020-06-15 DIAGNOSIS — J441 Chronic obstructive pulmonary disease with (acute) exacerbation: Secondary | ICD-10-CM | POA: Diagnosis not present

## 2020-06-15 DIAGNOSIS — I10 Essential (primary) hypertension: Secondary | ICD-10-CM | POA: Diagnosis not present

## 2020-06-15 DIAGNOSIS — E876 Hypokalemia: Secondary | ICD-10-CM | POA: Diagnosis not present

## 2020-06-15 DIAGNOSIS — J9621 Acute and chronic respiratory failure with hypoxia: Secondary | ICD-10-CM | POA: Diagnosis not present

## 2020-06-15 DIAGNOSIS — R7989 Other specified abnormal findings of blood chemistry: Secondary | ICD-10-CM | POA: Diagnosis not present

## 2020-06-15 DIAGNOSIS — J189 Pneumonia, unspecified organism: Secondary | ICD-10-CM | POA: Diagnosis not present

## 2020-06-15 DIAGNOSIS — E785 Hyperlipidemia, unspecified: Secondary | ICD-10-CM | POA: Diagnosis not present

## 2020-06-15 DIAGNOSIS — E871 Hypo-osmolality and hyponatremia: Secondary | ICD-10-CM | POA: Diagnosis not present

## 2020-06-16 DIAGNOSIS — E039 Hypothyroidism, unspecified: Secondary | ICD-10-CM | POA: Diagnosis not present

## 2020-06-16 DIAGNOSIS — E871 Hypo-osmolality and hyponatremia: Secondary | ICD-10-CM | POA: Diagnosis not present

## 2020-06-16 DIAGNOSIS — I10 Essential (primary) hypertension: Secondary | ICD-10-CM | POA: Diagnosis not present

## 2020-06-16 DIAGNOSIS — R7989 Other specified abnormal findings of blood chemistry: Secondary | ICD-10-CM | POA: Diagnosis not present

## 2020-06-16 DIAGNOSIS — J9621 Acute and chronic respiratory failure with hypoxia: Secondary | ICD-10-CM | POA: Diagnosis not present

## 2020-06-16 DIAGNOSIS — J441 Chronic obstructive pulmonary disease with (acute) exacerbation: Secondary | ICD-10-CM | POA: Diagnosis not present

## 2020-06-16 DIAGNOSIS — E669 Obesity, unspecified: Secondary | ICD-10-CM | POA: Diagnosis not present

## 2020-06-16 DIAGNOSIS — E876 Hypokalemia: Secondary | ICD-10-CM | POA: Diagnosis not present

## 2020-06-16 DIAGNOSIS — I214 Non-ST elevation (NSTEMI) myocardial infarction: Secondary | ICD-10-CM | POA: Diagnosis not present

## 2020-06-16 DIAGNOSIS — Z9989 Dependence on other enabling machines and devices: Secondary | ICD-10-CM | POA: Diagnosis not present

## 2020-06-16 DIAGNOSIS — Z6833 Body mass index (BMI) 33.0-33.9, adult: Secondary | ICD-10-CM | POA: Diagnosis not present

## 2020-06-16 DIAGNOSIS — F32A Depression, unspecified: Secondary | ICD-10-CM | POA: Diagnosis not present

## 2020-06-18 DIAGNOSIS — Z7902 Long term (current) use of antithrombotics/antiplatelets: Secondary | ICD-10-CM | POA: Diagnosis not present

## 2020-06-18 DIAGNOSIS — J9621 Acute and chronic respiratory failure with hypoxia: Secondary | ICD-10-CM | POA: Diagnosis not present

## 2020-06-18 DIAGNOSIS — G4733 Obstructive sleep apnea (adult) (pediatric): Secondary | ICD-10-CM | POA: Diagnosis not present

## 2020-06-18 DIAGNOSIS — Z720 Tobacco use: Secondary | ICD-10-CM | POA: Diagnosis not present

## 2020-06-18 DIAGNOSIS — I2510 Atherosclerotic heart disease of native coronary artery without angina pectoris: Secondary | ICD-10-CM | POA: Diagnosis not present

## 2020-06-18 DIAGNOSIS — J449 Chronic obstructive pulmonary disease, unspecified: Secondary | ICD-10-CM | POA: Diagnosis not present

## 2020-06-18 DIAGNOSIS — G6181 Chronic inflammatory demyelinating polyneuritis: Secondary | ICD-10-CM | POA: Diagnosis not present

## 2020-06-18 DIAGNOSIS — J189 Pneumonia, unspecified organism: Secondary | ICD-10-CM | POA: Diagnosis not present

## 2020-06-18 DIAGNOSIS — E119 Type 2 diabetes mellitus without complications: Secondary | ICD-10-CM | POA: Diagnosis not present

## 2020-06-18 DIAGNOSIS — J441 Chronic obstructive pulmonary disease with (acute) exacerbation: Secondary | ICD-10-CM | POA: Diagnosis not present

## 2020-06-18 DIAGNOSIS — I10 Essential (primary) hypertension: Secondary | ICD-10-CM | POA: Diagnosis not present

## 2020-06-18 DIAGNOSIS — J440 Chronic obstructive pulmonary disease with acute lower respiratory infection: Secondary | ICD-10-CM | POA: Diagnosis not present

## 2020-06-18 DIAGNOSIS — Z9989 Dependence on other enabling machines and devices: Secondary | ICD-10-CM | POA: Diagnosis not present

## 2020-06-18 DIAGNOSIS — I214 Non-ST elevation (NSTEMI) myocardial infarction: Secondary | ICD-10-CM | POA: Diagnosis not present

## 2020-06-18 DIAGNOSIS — F32A Depression, unspecified: Secondary | ICD-10-CM | POA: Diagnosis not present

## 2020-06-18 DIAGNOSIS — Z7984 Long term (current) use of oral hypoglycemic drugs: Secondary | ICD-10-CM | POA: Diagnosis not present

## 2020-06-18 DIAGNOSIS — Z9981 Dependence on supplemental oxygen: Secondary | ICD-10-CM | POA: Diagnosis not present

## 2020-06-18 DIAGNOSIS — Z794 Long term (current) use of insulin: Secondary | ICD-10-CM | POA: Diagnosis not present

## 2020-06-21 ENCOUNTER — Telehealth: Payer: Self-pay

## 2020-06-21 DIAGNOSIS — E1140 Type 2 diabetes mellitus with diabetic neuropathy, unspecified: Secondary | ICD-10-CM

## 2020-06-21 DIAGNOSIS — J449 Chronic obstructive pulmonary disease, unspecified: Secondary | ICD-10-CM

## 2020-06-21 DIAGNOSIS — K591 Functional diarrhea: Secondary | ICD-10-CM

## 2020-06-21 MED ORDER — LANTUS SOLOSTAR 100 UNIT/ML ~~LOC~~ SOPN: PEN_INJECTOR | SUBCUTANEOUS | 2 refills | Status: DC

## 2020-06-21 MED ORDER — DIPHENOXYLATE-ATROPINE 2.5-0.025 MG PO TABS: ORAL_TABLET | ORAL | 5 refills | Status: DC

## 2020-06-21 NOTE — Telephone Encounter (Signed)
Patient called, she was admitted to [facility] on 06/12/20 and was discharged on 06/17/20. She is in need of several refills such as lantus and lomotil, also needing some medication for thrush in mouth.   Patient also notes that she needs orders placed for home health.  Will be contacting patient to schedule hospital follow up to address all things listed above. Patient cannot leave home and will have to do this virtually- do you want extended time to do a phone visit with her?

## 2020-06-21 NOTE — Telephone Encounter (Signed)
Medication sent.  Okay to place order for home health yes please schedule virtual follow-up if that is all she can do.  Try to place 40 min  slot if possible.

## 2020-06-22 DIAGNOSIS — I214 Non-ST elevation (NSTEMI) myocardial infarction: Secondary | ICD-10-CM | POA: Diagnosis not present

## 2020-06-22 DIAGNOSIS — J441 Chronic obstructive pulmonary disease with (acute) exacerbation: Secondary | ICD-10-CM | POA: Diagnosis not present

## 2020-06-22 DIAGNOSIS — J440 Chronic obstructive pulmonary disease with acute lower respiratory infection: Secondary | ICD-10-CM | POA: Diagnosis not present

## 2020-06-22 DIAGNOSIS — E119 Type 2 diabetes mellitus without complications: Secondary | ICD-10-CM | POA: Diagnosis not present

## 2020-06-22 DIAGNOSIS — J189 Pneumonia, unspecified organism: Secondary | ICD-10-CM | POA: Diagnosis not present

## 2020-06-22 DIAGNOSIS — J9621 Acute and chronic respiratory failure with hypoxia: Secondary | ICD-10-CM | POA: Diagnosis not present

## 2020-06-22 MED ORDER — NYSTATIN 100000 UNIT/ML MT SUSP: 5.0000 mL | Freq: Four times a day (QID) | OROMUCOSAL | 0 refills | Status: DC

## 2020-06-22 NOTE — Telephone Encounter (Signed)
Scription sent for nystatin swish and swallow.  And keep replacing home health orders.

## 2020-06-22 NOTE — Telephone Encounter (Signed)
Patient advised. She is requesting mouth wash for thrust, since she is starting new inhalers. She is aware to rinse mouth after use.   I pended RX for magic mouthwash.. can you just look over and fill in what is needed?   HFU scheduled in 40 minute slot on Thursday 11/18 and orders for home health have been placed

## 2020-06-22 NOTE — Telephone Encounter (Signed)
Patient advised.

## 2020-06-24 ENCOUNTER — Other Ambulatory Visit: Payer: Self-pay | Admitting: Family Medicine

## 2020-06-24 ENCOUNTER — Encounter: Payer: Self-pay | Admitting: Family Medicine

## 2020-06-24 ENCOUNTER — Ambulatory Visit (INDEPENDENT_AMBULATORY_CARE_PROVIDER_SITE_OTHER): Payer: Medicare Other | Admitting: Family Medicine

## 2020-06-24 DIAGNOSIS — E1140 Type 2 diabetes mellitus with diabetic neuropathy, unspecified: Secondary | ICD-10-CM | POA: Diagnosis not present

## 2020-06-24 DIAGNOSIS — J441 Chronic obstructive pulmonary disease with (acute) exacerbation: Secondary | ICD-10-CM | POA: Diagnosis not present

## 2020-06-24 DIAGNOSIS — Z720 Tobacco use: Secondary | ICD-10-CM | POA: Diagnosis not present

## 2020-06-24 DIAGNOSIS — R059 Cough, unspecified: Secondary | ICD-10-CM | POA: Diagnosis not present

## 2020-06-24 DIAGNOSIS — J189 Pneumonia, unspecified organism: Secondary | ICD-10-CM | POA: Diagnosis not present

## 2020-06-24 DIAGNOSIS — J449 Chronic obstructive pulmonary disease, unspecified: Secondary | ICD-10-CM | POA: Diagnosis not present

## 2020-06-24 DIAGNOSIS — J9621 Acute and chronic respiratory failure with hypoxia: Secondary | ICD-10-CM | POA: Diagnosis not present

## 2020-06-24 DIAGNOSIS — J440 Chronic obstructive pulmonary disease with acute lower respiratory infection: Secondary | ICD-10-CM | POA: Diagnosis not present

## 2020-06-24 DIAGNOSIS — G4733 Obstructive sleep apnea (adult) (pediatric): Secondary | ICD-10-CM

## 2020-06-24 DIAGNOSIS — I214 Non-ST elevation (NSTEMI) myocardial infarction: Secondary | ICD-10-CM | POA: Diagnosis not present

## 2020-06-24 DIAGNOSIS — E119 Type 2 diabetes mellitus without complications: Secondary | ICD-10-CM | POA: Diagnosis not present

## 2020-06-24 DIAGNOSIS — E871 Hypo-osmolality and hyponatremia: Secondary | ICD-10-CM | POA: Diagnosis not present

## 2020-06-24 NOTE — Assessment & Plan Note (Signed)
Urged her to make sure she is wearing her CPAP at night and not just using the oxygen.  Explained that there is a difference between the 2.

## 2020-06-24 NOTE — Progress Notes (Addendum)
Virtual Visit via Telephone Note  I connected with [name] on 06/24/20 at  9:50 AM EST by telephone and verified that I am speaking with the correct person using two identifiers.   I discussed the limitations, risks, security and privacy concerns of performing an evaluation and management service by telephone and the availability of in person appointments. I also discussed with the patient that there may be a patient responsible charge related to this service. The patient expressed understanding and agreed to proceed.  Patient location: at home Provider loccation: In office   Subjective:    CC: Hospital follow-up  HPI: She was admitted to [facility] on November 6 and discharged home 4 days later on November 10.  She was diagnosed with a COPD exacerbation after she presented for worsening shortness of breath cough and night sweats.  Actually been seen earlier this month on November 1 for COPD exacerbation and discharged home on a Z-Pak with prednisone taper.  Not been taking her medication for a couple days because she had such a severe cough.  He says she was discharged home on oxygen on 2 L.  She has not been using her CPAP at night since she got home she is just been wearing the oxygen because she is not been sleeping in her own bed she has been sleeping in her living room.  She was negative for Covid, flu and RSV.  Imaging was consistent with a COPD exacerbation/possible pneumonia she received a 5-day regimen of Rocephin and doxycycline.  She was also noted to have a slight elevation in troponin she has known atherosclerosis.  Currently on metoprolol, Lasix, aspirin and Plavix as well as statin therapy.  She did have an echocardiogram performed on November 6 showing normal EF of 55 to 60% with no worrisome findings.  They did not recommend any further intervention.  She had a slightly elevated D-dimer as well but CTA pulmonary was negative for pulmonary  embolism.  She was found to be [place] hyponatremic with a sodium of 125 it did correct up to 129 upon admission.  Felt to be secondary to a combination of medications including her Lasix and Prozac as well as being hyperglycemic upon admission.  Sodium at discharge was 132.  Spoke with pt she informed me that she is extremely weak/and tires easily. She was sent home from the hospital on 2L of O2 and said that when she goes to the bathroom she takes it off because she gets tangled up in the tubing and by the time she puts it back on she is really struggling.  She has PT coming out 2x a week on Tuesdays and Thursdays and said that somehow there was a mixup because there is someone coming today from another agency at 10 AM that she has to send back because they are not supposed to be there.   She asked me about how long it would take for her to get a [facility] aide I told her that due to [place] and there being a shortage this is taking longer than it normally has in the past.   Also need to let me know that since being home she went to the bathroom she does remember if she just urinated or had a bowel movement or had both but suddenly was having some bright red blood rectal bleeding.  She says it was a lot she is never seen much but eventually got it to stop.  She is  noticed just a little bit of blood when she wipes now.  She also let me know that she is quitting smoking she has been wearing the 14 mg nicotine patch and actually doing really well.  Past medical history, Surgical history, Family history not pertinant except as noted below, Social history, Allergies, and medications have been entered into the medical record, reviewed, and corrections made.   Review of Systems: No fevers, chills, night sweats, weight loss, chest pain, or shortness of breath.   Objective:    General: Speaking clearly in complete sentences without any shortness of breath.  Alert and oriented x3.  Normal judgment. No apparent  acute distress.    Impression and Recommendations:     COPD (chronic obstructive pulmonary disease) with chronic bronchitis (HCC) Feeling much better from her COPD exacerbation.  She does report though that she has been coughing a little bit more in the last 2 days mostly feeling like it is in the back of her throat though she denies any postnasal drip or drainage or nasal congestion.  She actually just finished her methylprednisolone this morning.  She did complete the full course.  She has been wearing her oxygen consistently and says she feels much better.  OSA (obstructive sleep apnea) Urged her to make sure she is wearing her CPAP at night and not just using the oxygen.  Explained that there is a difference between the 2.  Diabetes mellitus with neuropathy Blood sugars were quite elevated during the hospitalization but recent A1c was 6.8.  Please continue to work on healthy food choices and continue current medication regimen with no changes.  Suspect that her steroids likely will cause a temporary increase in blood sugars.  Hyponatremia Hyponatremic upon admission plan to recheck that through home health.  Rectal bleeding-unclear etiology suspect probably just a ruptured hemorrhoid based on her description of what occurred would like to check a CBC to check her hemoglobin with labs via home health.  We will fax that over to them and get that scheduled through [facility] home health.  Tobacco abuse-just encouraged her to continue to work out she is actually doing well with the nicotine patch.   I discussed the assessment and treatment plan with the patient. The patient was provided an opportunity to ask questions and all were answered. The patient agreed with the plan and demonstrated an understanding of the instructions.   The patient was advised to call back or seek an in-person evaluation if the symptoms worsen or if the condition fails to improve as anticipated.  I provided 30 minutes  of non-face-to-face time during this encounter.   [name], MD

## 2020-06-24 NOTE — Progress Notes (Signed)
Spoke with pt she informed me that she is extremely weak/and tires easily. She was sent home from the hospital on 2L of O2 and said that when she goes to the bathroom she takes it off because she gets tangled up in the tubing and by the time she puts it back on she is really struggling.  She has PT coming out 2x a week on Tuesdays and Thursdays and said that somehow there was a mixup because there is someone coming today from another agency at 10 AM that she has to send back because they are not supposed to be there.   She asked me about how long it would take for her to get a [facility] aide I told her that due to [place] and there being a shortage this is taking longer than it normally has in the past.

## 2020-06-24 NOTE — Assessment & Plan Note (Signed)
Feeling much better from her COPD exacerbation.  She does report though that she has been coughing a little bit more in the last 2 days mostly feeling like it is in the back of her throat though she denies any postnasal drip or drainage or nasal congestion.  She actually just finished her methylprednisolone this morning.  She did complete the full course.  She has been wearing her oxygen consistently and says she feels much better.

## 2020-06-24 NOTE — Assessment & Plan Note (Signed)
Blood sugars were quite elevated during the hospitalization but recent A1c was 6.8.  Please continue to work on healthy food choices and continue current medication regimen with no changes.  Suspect that her steroids likely will cause a temporary increase in blood sugars.

## 2020-06-24 NOTE — Assessment & Plan Note (Signed)
Hyponatremic upon admission plan to recheck that through home health.

## 2020-06-25 ENCOUNTER — Telehealth: Payer: Self-pay | Admitting: *Deleted

## 2020-06-25 NOTE — Telephone Encounter (Signed)
Spoke w/Carley at [facility]. She stated that the pt only has PT ordered and she could take a verbal order for nursing for her to get labs done for the cbc/d and bmp gfr.   Verbal order given for nursing and for labs. Their fax # [phone]

## 2020-06-28 ENCOUNTER — Telehealth (INDEPENDENT_AMBULATORY_CARE_PROVIDER_SITE_OTHER): Payer: Medicare Other | Admitting: Family Medicine

## 2020-06-28 ENCOUNTER — Telehealth: Payer: Self-pay | Admitting: *Deleted

## 2020-06-28 ENCOUNTER — Encounter: Payer: Self-pay | Admitting: Family Medicine

## 2020-06-28 DIAGNOSIS — M47817 Spondylosis without myelopathy or radiculopathy, lumbosacral region: Secondary | ICD-10-CM

## 2020-06-28 DIAGNOSIS — G4733 Obstructive sleep apnea (adult) (pediatric): Secondary | ICD-10-CM | POA: Diagnosis not present

## 2020-06-28 DIAGNOSIS — G6181 Chronic inflammatory demyelinating polyneuritis: Secondary | ICD-10-CM

## 2020-06-28 DIAGNOSIS — I10 Essential (primary) hypertension: Secondary | ICD-10-CM

## 2020-06-28 DIAGNOSIS — R059 Cough, unspecified: Secondary | ICD-10-CM | POA: Diagnosis not present

## 2020-06-28 DIAGNOSIS — J449 Chronic obstructive pulmonary disease, unspecified: Secondary | ICD-10-CM

## 2020-06-28 MED ORDER — AMBULATORY NON FORMULARY MEDICATION: 0 refills | Status: DC

## 2020-06-28 NOTE — Assessment & Plan Note (Signed)
She would like to get a home health aide.

## 2020-06-28 NOTE — Assessment & Plan Note (Signed)
Courage to consistently use her CPAP at night.  Even though she does not have good sleep maintenance.  It does make a difference and it is different than just wearing her oxygen.

## 2020-06-28 NOTE — Progress Notes (Signed)
Established Patient Office Visit  Subjective:  Patient ID: [name], female    DOB: [birth year]  Age: 74 y.o. MRN: [id number]  CC:  Chief Complaint  Patient presents with  . Cough  . Hypertension    HPI [name] presents for follow-up of low pulse and blood pressure.  She said they actually came out to the house and did IV fluids they told her to hold her metoprolol and her Lasix because of her low pulse.  She is not sure if her blood pressure or her pulse today.  She has not been swelling excessively.  Usually she can tell if she is more swollen when her left ankle swells.  She is not able to get on the scale and weigh herself regularly.  She had mentioned that she had started develop a dry cough when I talked to her last week she says it still persisting.  No fevers or chills or sweats.  No increase shortness of breath.  She denies any heartburn or reflux symptoms.  She did wear her CPAP again for the 1st time last night and has been wearing her oxygen during the day.  Has not been doing her nebulizer consistently.  Currently getting home health services through [facility] home health and would like to see if she would qualify for home health aide.  Past Medical History:  Diagnosis Date  . Arthritis   . Charcot-Marie-Tooth disease   . COPD (chronic obstructive pulmonary disease) ([place])   . DDD (degenerative disc disease)    Lumbar and lumbosacral  . Depression   . Diabetes mellitus    type 2  . Diabetic peripheral neuropathy ([place])   . Facet syndrome, lumbar   . Gastric ulcer    w/o hemorrhage  . Hyperlipidemia   . Hypertension   . Insomnia   . Lumbosacral root lesions, not elsewhere classified   . Neurogenic bladder   . Obesity   . Osteopenia   . Other symptoms referable to back   . Post-menopausal   . PUD (peptic ulcer disease)   . 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 Spinal stenosis, lumbar region, without neurogenic claudication   .  Thoracic spondylosis without myelopathy   . Thyroid disease    hypo  . Tobacco dependence   . Unspecified hereditary and idiopathic peripheral neuropathy     Past Surgical History:  Procedure Laterality Date  . ANKLE RECONSTRUCTION     left ankle, plate and pins   . bilateral L3 medial branch block    . CHOLECYSTECTOMY    . fibroid tumor removal    . fluoroscopic L5 dorsal medial branch bloc    . RENAL ARTERY STENT  11/28/2010   left  . REPLACEMENT TOTAL KNEE      Family History  Problem Relation Age of Onset  . Stroke Mother   . Heart disease Father 3       heart attack  . Hypertension Father   . Cancer Father        lung  . Diabetes Paternal Aunt        insulin dependent    Social History   Socioeconomic History  . Marital status: Widowed    Spouse name: Not on file  . Number of children: Not on file  . Years of education: Not on file  . Highest education level: Not on file  Occupational History  . Not on file  Tobacco Use  .  Smoking status: Current Every Day Smoker    Packs/day: 0.75    Years: 49.00    Pack years: 36.75    Types: Cigarettes  . Smokeless tobacco: Never Used  . Tobacco comment: pt states she is going to try the E-cig  Substance and Sexual Activity  . Alcohol use: No    Alcohol/week: 0.0 standard drinks  . Drug use: No  . Sexual activity: Not on file  Other Topics Concern  . Not on file  Social History Narrative  . Not on file   Social Determinants of Health   Financial Resource Strain:   . Difficulty of Paying Living Expenses: Not on file  Food Insecurity:   . Worried About Charity fundraiser in the Last Year: Not on file  . Ran Out of Food in the Last Year: Not on file  Transportation Needs:   . Lack of Transportation (Medical): Not on file  . Lack of Transportation (Non-Medical): Not on file  Physical Activity:   . Days of Exercise per Week: Not on file  . Minutes of Exercise per Session: Not on file  Stress:   . Feeling  of Stress : Not on file  Social Connections:   . Frequency of Communication with Friends and Family: Not on file  . Frequency of Social Gatherings with Friends and Family: Not on file  . Attends Religious Services: Not on file  . Active Member of Clubs or Organizations: Not on file  . Attends Archivist Meetings: Not on file  . Marital Status: Not on file  Intimate Partner Violence:   . Fear of Current or Ex-Partner: Not on file  . Emotionally Abused: Not on file  . Physically Abused: Not on file  . Sexually Abused: Not on file    Outpatient Medications Prior to Visit  Medication Sig Dispense Refill  . albuterol (ACCUNEB) 1.25 MG/3ML nebulizer solution Take 3 mLs (1.25 mg total) by nebulization every 6 (six) hours as needed for wheezing or shortness of breath. 75 mL 12  . alendronate (FOSAMAX) 70 MG tablet TAKE 1 TABLET BY MOUTH EVERY 7 DAYS. TAKE IN THE MORNING WITH A FULL GLASS OF WATER ON AN EMPTY STOMACH. DO NOT TAKE OTHER FOOD OR DRINK OR LIE DOWN 12 tablet 1  . AMBULATORY NON FORMULARY MEDICATION Medication Name: Nebulizer machine with equipment.  Diagnosis COPD.  Patient is primarily homebound.  Is faxed to [facility]. She is an established patient. 1 Units 0  . AMBULATORY NON FORMULARY MEDICATION Medication Name: Rollator Dx: 1 each 0  . aspirin EC 81 MG tablet Take 81 mg by mouth daily.    . blood glucose meter kit and supplies KIT Dispense based on patient and insurance preference. Use up to four times daily as directed Dx E11.40 1 each 0  . chlorpheniramine-HYDROcodone (TUSSIONEX PENNKINETIC ER) 10-8 MG/5ML SUER Take 5 mLs by mouth at bedtime as needed for cough. 70 mL 0  . clopidogrel (PLAVIX) 75 MG tablet TAKE ONE TABLET BY MOUTH EVERY DAY 30 tablet 11  . dicyclomine (BENTYL) 20 MG tablet TAKE ONE TABLET BY MOUTH THREE TIMES DAILY AS NEEDED FOR SPASMS 90 tablet 2  . diphenoxylate-atropine (LOMOTIL) 2.5-0.025 MG tablet TAKE ONE OR TWO TABLETS BY MOUTH TWICE DAILY AS  NEEDED FOR DIARRHEA OR LOOSE STOOLS 60 tablet 5  . FLUoxetine (PROZAC) 40 MG capsule Take 2 capsules (80 mg total) by mouth daily. 180 capsule 2  . gabapentin (NEURONTIN) 400 MG capsule Take 400  mg with breakfast, lunch and dinner. Take 800 mg before bed    . GAMUNEX-C 10 GM/100ML SOLN     . GAMUNEX-C 20 GM/200ML SOLN     . insulin glargine (LANTUS SOLOSTAR) 100 UNIT/ML Solostar Pen Inject 10-15 Units into the skin at bedtime. 15 mL 2  . Lancets MISC by Does not apply route.      . levalbuterol (XOPENEX) 0.63 MG/3ML nebulizer solution Take 3 mLs (0.63 mg total) by nebulization every 8 (eight) hours as needed for wheezing or shortness of breath. 60 mL 3  . levothyroxine (SYNTHROID) 125 MCG tablet Take 1 tablet (125 mcg total) by mouth daily. 30 tablet 2  . lidocaine (XYLOCAINE) 5 % ointment Apply 1 application topically daily. 2 hours before skin procedure. 35.44 g 11  . meclizine (ANTIVERT) 25 MG tablet Take 1 tablet (25 mg total) by mouth 3 (three) times daily as needed for dizziness. 30 tablet 6  . metFORMIN (GLUCOPHAGE) 500 MG tablet TAKE ONE TABLET BY MOUTH THREE TIMES DAILY 270 tablet 1  . Multiple Vitamins-Minerals (PRESERVISION AREDS 2) CAPS     . mycophenolate (CELLCEPT) 500 MG tablet Take by mouth.  5  . nitroGLYCERIN (NITROSTAT) 0.4 MG SL tablet Place 1 tablet (0.4 mg total) under the tongue every 5 (five) minutes as needed. 10 tablet 2  . PRODIGY NO CODING BLOOD GLUC test strip USE AS DIRECTED 100 each 0  . simvastatin (ZOCOR) 40 MG tablet TAKE ONE TABLET BY MOUTH EVERY DAY AT SIX IN THE EVENING 90 tablet 3  . TRELEGY ELLIPTA 100-62.5-25 MCG/INH AEPB Inhale 1 puff into the lungs every morning. 60 each 5  . TRUEPLUS PEN NEEDLES 32G X 4 MM MISC USE WHEN INJECTING INSULIN DAILY 100 each 6  . zolpidem (AMBIEN) 5 MG tablet Take 1 tablet (5 mg total) by mouth at bedtime. 90 tablet 0  . AMBULATORY NON FORMULARY MEDICATION Medication Name: Auto Pap set to 5cm water pressure with 2 liter  nocturnal oxygen as she desats as well. AHI 21.  After one week fax download form machine so we can bettter set her CPAP.  Fax to [facility]. 1 vial 0  . HYDROcodone-acetaminophen (NORCO) 10-325 MG per tablet Take 1 tablet by mouth every 6 (six) hours as needed. Do not take more that 5 tabs a day    . HYDROcodone-homatropine (HYCODAN) 5-1.5 MG/5ML syrup Take 5 mLs by mouth at bedtime as needed for cough. 120 mL 0  . nystatin (MYCOSTATIN) 100000 UNIT/ML suspension Take 5 mLs (500,000 Units total) by mouth 4 (four) times daily. X 1 week. Swish and hold in mouth for at least 2 minutes and then swallow. 473 mL 0  . furosemide (LASIX) 40 MG tablet Take 1 or 2 tablets by mouth every morning/LABS FOR REFILLS. (Patient not taking: Reported on 06/28/2020) 90 tablet 0  . metoprolol tartrate (LOPRESSOR) 50 MG tablet Take 1 tablet (50 mg total) by mouth 2 (two) times daily. (Patient not taking: Reported on 06/28/2020) 180 tablet 3  . penicillin v potassium (VEETID) 500 MG tablet Take 1 tablet (500 mg total) by mouth in the morning and at bedtime. 20 tablet 0   No facility-administered medications prior to visit.    Allergies  Allergen Reactions  . Varenicline Nausea And Vomiting    ROS Review of Systems    Objective:    Physical Exam  There were no vitals taken for this visit. Wt Readings from Last 3 Encounters:  05/06/20 208 lb (94.3 kg)  09/10/19 192 lb (87.1 kg)  08/05/19 191 lb (86.6 kg)     Health Maintenance Due  Topic Date Due  . URINE MICROALBUMIN  12/05/2017  . INFLUENZA VACCINE  03/07/2020  . FOOT EXAM  05/01/2020    There are no preventive care reminders to display for this patient.  Lab Results  Component Value Date   TSH 1.61 05/06/2020   Lab Results  Component Value Date   WBC 7.4 05/06/2020   HGB 13.2 05/06/2020   HCT 38.5 05/06/2020   MCV 97.5 05/06/2020   PLT 256 05/06/2020   Lab Results  Component Value Date   NA 137 05/06/2020   K 4.7 05/06/2020   CO2 28  05/06/2020   GLUCOSE 121 (H) 05/06/2020   BUN 23 05/06/2020   CREATININE 1.09 (H) 05/06/2020   BILITOT 1.2 05/06/2020   ALKPHOS 51 09/01/2015   AST 28 05/06/2020   ALT 17 05/06/2020   PROT 8.0 05/06/2020   ALBUMIN 3.4 (L) 09/01/2015   CALCIUM 9.4 05/06/2020   Lab Results  Component Value Date   CHOL 128 09/01/2015   Lab Results  Component Value Date   HDL 42 (L) 09/01/2015   Lab Results  Component Value Date   LDLCALC 61 09/01/2015   Lab Results  Component Value Date   TRIG 124 09/01/2015   Lab Results  Component Value Date   CHOLHDL 3.0 09/01/2015   Lab Results  Component Value Date   HGBA1C 5.3 05/06/2020      Assessment & Plan:   Problem List Items Addressed This Visit      Cardiovascular and Mediastinum   HYPERTENSION, BENIGN SYSTEMIC    Will call to get BP log.  She is holding her lasix and metoprolol.  Ok to use lasix PRN for swelling.  Urged her to keep an eye on this and she is unable to do daily weights.        Respiratory   OSA (obstructive sleep apnea)    Courage to consistently use her CPAP at night.  Even though she does not have good sleep maintenance.  It does make a difference and it is different than just wearing her oxygen.      COPD (chronic obstructive pulmonary disease) with chronic bronchitis (HCC)    Crease and dry cough over the last week.  Offered to get her scheduled for chest x-ray but she declined.  No shortness of breath fever chills to suggest underlying infection she just finished taking prednisone about 4 days ago.  She can call back if it changes or gets worse or becomes more productive.  Difficult to assess without seeing her in person.  Home health has been coming out 3 times a week so they can certainly let me know if they start to hear any abnormal chest sounds.        Nervous and Auditory   CIDP (chronic inflammatory demyelinating polyneuropathy) (HCC)   Relevant Medications   AMBULATORY NON FORMULARY MEDICATION      Musculoskeletal and Integument   Lumbosacral spondylosis without myelopathy    She would like to get a home health aide.       Other Visit Diagnoses    Cough    -  Primary       Meds ordered this encounter  Medications  . AMBULATORY NON FORMULARY MEDICATION    Sig: Medication Name: Patient needs home health aide.  Please fax order to [place] [facility] who she is currently getting home  health services through.    Dispense:  1 Units    Refill:  0    Follow-up: No follow-ups on file.    I spent 25 minutes on the day of the encounter to include pre-visit record review, face-to-face time with the patient and post visit ordering of test.  [name], MD

## 2020-06-28 NOTE — Progress Notes (Signed)
Pt reports that her cough is coming back but it is dry. She is constantly coughing without any relief. She took the Tussionex at bedtime which did help her during the night but during the day she is tending to have coughing spells.

## 2020-06-28 NOTE — Assessment & Plan Note (Signed)
Crease and dry cough over the last week.  Offered to get her scheduled for chest x-ray but she declined.  No shortness of breath fever chills to suggest underlying infection she just finished taking prednisone about 4 days ago.  She can call back if it changes or gets worse or becomes more productive.  Difficult to assess without seeing her in person.  Home health has been coming out 3 times a week so they can certainly let me know if they start to hear any abnormal chest sounds.

## 2020-06-28 NOTE — Assessment & Plan Note (Signed)
Will call to get BP log.  She is holding her lasix and metoprolol.  Ok to use lasix PRN for swelling.  Urged her to keep an eye on this and she is unable to do daily weights.

## 2020-06-28 NOTE — Telephone Encounter (Signed)
Orders for [place] faxed. Confirmation received.

## 2020-06-29 ENCOUNTER — Telehealth: Payer: Self-pay

## 2020-06-29 DIAGNOSIS — J189 Pneumonia, unspecified organism: Secondary | ICD-10-CM | POA: Diagnosis not present

## 2020-06-29 DIAGNOSIS — E119 Type 2 diabetes mellitus without complications: Secondary | ICD-10-CM | POA: Diagnosis not present

## 2020-06-29 DIAGNOSIS — J449 Chronic obstructive pulmonary disease, unspecified: Secondary | ICD-10-CM | POA: Diagnosis not present

## 2020-06-29 DIAGNOSIS — I959 Hypotension, unspecified: Secondary | ICD-10-CM | POA: Diagnosis not present

## 2020-06-29 DIAGNOSIS — I10 Essential (primary) hypertension: Secondary | ICD-10-CM | POA: Diagnosis not present

## 2020-06-29 DIAGNOSIS — E1165 Type 2 diabetes mellitus with hyperglycemia: Secondary | ICD-10-CM | POA: Diagnosis not present

## 2020-06-29 DIAGNOSIS — R0902 Hypoxemia: Secondary | ICD-10-CM | POA: Diagnosis not present

## 2020-06-29 DIAGNOSIS — E785 Hyperlipidemia, unspecified: Secondary | ICD-10-CM | POA: Diagnosis not present

## 2020-06-29 DIAGNOSIS — E079 Disorder of thyroid, unspecified: Secondary | ICD-10-CM | POA: Diagnosis not present

## 2020-06-29 DIAGNOSIS — J441 Chronic obstructive pulmonary disease with (acute) exacerbation: Secondary | ICD-10-CM | POA: Diagnosis not present

## 2020-06-29 DIAGNOSIS — G4730 Sleep apnea, unspecified: Secondary | ICD-10-CM | POA: Diagnosis not present

## 2020-06-29 DIAGNOSIS — I714 Abdominal aortic aneurysm, without rupture: Secondary | ICD-10-CM | POA: Diagnosis not present

## 2020-06-29 DIAGNOSIS — J9621 Acute and chronic respiratory failure with hypoxia: Secondary | ICD-10-CM | POA: Diagnosis not present

## 2020-06-29 DIAGNOSIS — Z79899 Other long term (current) drug therapy: Secondary | ICD-10-CM | POA: Diagnosis not present

## 2020-06-29 DIAGNOSIS — Z7951 Long term (current) use of inhaled steroids: Secondary | ICD-10-CM | POA: Diagnosis not present

## 2020-06-29 DIAGNOSIS — Z7902 Long term (current) use of antithrombotics/antiplatelets: Secondary | ICD-10-CM | POA: Diagnosis not present

## 2020-06-29 DIAGNOSIS — I214 Non-ST elevation (NSTEMI) myocardial infarction: Secondary | ICD-10-CM | POA: Diagnosis not present

## 2020-06-29 DIAGNOSIS — R58 Hemorrhage, not elsewhere classified: Secondary | ICD-10-CM | POA: Diagnosis not present

## 2020-06-29 DIAGNOSIS — Z7982 Long term (current) use of aspirin: Secondary | ICD-10-CM | POA: Diagnosis not present

## 2020-06-29 DIAGNOSIS — Z7984 Long term (current) use of oral hypoglycemic drugs: Secondary | ICD-10-CM | POA: Diagnosis not present

## 2020-06-29 DIAGNOSIS — Z888 Allergy status to other drugs, medicaments and biological substances status: Secondary | ICD-10-CM | POA: Diagnosis not present

## 2020-06-29 DIAGNOSIS — K7689 Other specified diseases of liver: Secondary | ICD-10-CM | POA: Diagnosis not present

## 2020-06-29 DIAGNOSIS — N289 Disorder of kidney and ureter, unspecified: Secondary | ICD-10-CM | POA: Diagnosis not present

## 2020-06-29 DIAGNOSIS — Z794 Long term (current) use of insulin: Secondary | ICD-10-CM | POA: Diagnosis not present

## 2020-06-29 DIAGNOSIS — R9431 Abnormal electrocardiogram [ECG] [EKG]: Secondary | ICD-10-CM | POA: Diagnosis not present

## 2020-06-29 DIAGNOSIS — F17210 Nicotine dependence, cigarettes, uncomplicated: Secondary | ICD-10-CM | POA: Diagnosis not present

## 2020-06-29 DIAGNOSIS — J440 Chronic obstructive pulmonary disease with acute lower respiratory infection: Secondary | ICD-10-CM | POA: Diagnosis not present

## 2020-06-29 DIAGNOSIS — Z79890 Hormone replacement therapy (postmenopausal): Secondary | ICD-10-CM | POA: Diagnosis not present

## 2020-06-29 DIAGNOSIS — K921 Melena: Secondary | ICD-10-CM | POA: Diagnosis not present

## 2020-06-29 DIAGNOSIS — K649 Unspecified hemorrhoids: Secondary | ICD-10-CM | POA: Diagnosis not present

## 2020-06-29 DIAGNOSIS — R600 Localized edema: Secondary | ICD-10-CM | POA: Diagnosis not present

## 2020-06-29 NOTE — Telephone Encounter (Signed)
Thank you :)

## 2020-06-29 NOTE — Telephone Encounter (Signed)
[name] with [facility] called and states she seen [name] for the first time today. She is having rectal bleeding, bright red blood, for over a month. I advised she will need a visit ASAP. She states [name] is unable to come to the office. I advised her to go to the ED and call EMS is necessary.

## 2020-07-06 DIAGNOSIS — J189 Pneumonia, unspecified organism: Secondary | ICD-10-CM | POA: Diagnosis not present

## 2020-07-06 DIAGNOSIS — E119 Type 2 diabetes mellitus without complications: Secondary | ICD-10-CM | POA: Diagnosis not present

## 2020-07-06 DIAGNOSIS — J9621 Acute and chronic respiratory failure with hypoxia: Secondary | ICD-10-CM | POA: Diagnosis not present

## 2020-07-06 DIAGNOSIS — I214 Non-ST elevation (NSTEMI) myocardial infarction: Secondary | ICD-10-CM | POA: Diagnosis not present

## 2020-07-06 DIAGNOSIS — J440 Chronic obstructive pulmonary disease with acute lower respiratory infection: Secondary | ICD-10-CM | POA: Diagnosis not present

## 2020-07-06 DIAGNOSIS — J441 Chronic obstructive pulmonary disease with (acute) exacerbation: Secondary | ICD-10-CM | POA: Diagnosis not present

## 2020-07-11 ENCOUNTER — Other Ambulatory Visit: Payer: Self-pay | Admitting: Family Medicine

## 2020-07-13 DIAGNOSIS — J189 Pneumonia, unspecified organism: Secondary | ICD-10-CM | POA: Diagnosis not present

## 2020-07-13 DIAGNOSIS — J441 Chronic obstructive pulmonary disease with (acute) exacerbation: Secondary | ICD-10-CM | POA: Diagnosis not present

## 2020-07-13 DIAGNOSIS — J440 Chronic obstructive pulmonary disease with acute lower respiratory infection: Secondary | ICD-10-CM | POA: Diagnosis not present

## 2020-07-13 DIAGNOSIS — I214 Non-ST elevation (NSTEMI) myocardial infarction: Secondary | ICD-10-CM | POA: Diagnosis not present

## 2020-07-13 DIAGNOSIS — E119 Type 2 diabetes mellitus without complications: Secondary | ICD-10-CM | POA: Diagnosis not present

## 2020-07-13 DIAGNOSIS — J9621 Acute and chronic respiratory failure with hypoxia: Secondary | ICD-10-CM | POA: Diagnosis not present

## 2020-07-14 ENCOUNTER — Other Ambulatory Visit: Payer: Self-pay | Admitting: *Deleted

## 2020-07-14 ENCOUNTER — Telehealth: Payer: Self-pay

## 2020-07-14 DIAGNOSIS — E1140 Type 2 diabetes mellitus with diabetic neuropathy, unspecified: Secondary | ICD-10-CM

## 2020-07-14 MED ORDER — BLOOD GLUCOSE MONITOR KIT: PACK | 0 refills | Status: DC

## 2020-07-14 NOTE — Telephone Encounter (Signed)
Okay for wound care orders.

## 2020-07-14 NOTE — Telephone Encounter (Signed)
[name] with [facility] called and would like wound care orders. [name] slipped and almost fell, in the process she scrapped her left arm and right leg. They would like to apply Xeroform with a dry dressing. Please advise.

## 2020-07-15 ENCOUNTER — Telehealth: Payer: Self-pay | Admitting: Family Medicine

## 2020-07-15 DIAGNOSIS — I214 Non-ST elevation (NSTEMI) myocardial infarction: Secondary | ICD-10-CM | POA: Diagnosis not present

## 2020-07-15 DIAGNOSIS — J9621 Acute and chronic respiratory failure with hypoxia: Secondary | ICD-10-CM | POA: Diagnosis not present

## 2020-07-15 DIAGNOSIS — J189 Pneumonia, unspecified organism: Secondary | ICD-10-CM | POA: Diagnosis not present

## 2020-07-15 DIAGNOSIS — J441 Chronic obstructive pulmonary disease with (acute) exacerbation: Secondary | ICD-10-CM | POA: Diagnosis not present

## 2020-07-15 DIAGNOSIS — J440 Chronic obstructive pulmonary disease with acute lower respiratory infection: Secondary | ICD-10-CM | POA: Diagnosis not present

## 2020-07-15 DIAGNOSIS — E119 Type 2 diabetes mellitus without complications: Secondary | ICD-10-CM | POA: Diagnosis not present

## 2020-07-15 NOTE — Telephone Encounter (Signed)
Home Health advised.

## 2020-07-15 NOTE — Telephone Encounter (Signed)
[name] calling on behalf of pt. We called [name] but she was indisposed at time, can we call her back?

## 2020-07-15 NOTE — Telephone Encounter (Signed)
I didn't call her. Looks like there were orders placed thru home health for her?

## 2020-07-15 NOTE — Telephone Encounter (Signed)
I called her yesterday and wasn't able to reach her.  She'd left a vm needing a new rx for a glucometer sent to her pharmacy and I tried to call her to let her know that i'd sent it.  I spoke with her just a few minutes ago so she is aware.

## 2020-07-16 NOTE — Telephone Encounter (Signed)
Thank you :)

## 2020-07-18 DIAGNOSIS — J449 Chronic obstructive pulmonary disease, unspecified: Secondary | ICD-10-CM | POA: Diagnosis not present

## 2020-07-18 DIAGNOSIS — G6181 Chronic inflammatory demyelinating polyneuritis: Secondary | ICD-10-CM | POA: Diagnosis not present

## 2020-07-18 DIAGNOSIS — Z7902 Long term (current) use of antithrombotics/antiplatelets: Secondary | ICD-10-CM | POA: Diagnosis not present

## 2020-07-18 DIAGNOSIS — I2510 Atherosclerotic heart disease of native coronary artery without angina pectoris: Secondary | ICD-10-CM | POA: Diagnosis not present

## 2020-07-18 DIAGNOSIS — F32A Depression, unspecified: Secondary | ICD-10-CM | POA: Diagnosis not present

## 2020-07-18 DIAGNOSIS — Z794 Long term (current) use of insulin: Secondary | ICD-10-CM | POA: Diagnosis not present

## 2020-07-18 DIAGNOSIS — Z720 Tobacco use: Secondary | ICD-10-CM | POA: Diagnosis not present

## 2020-07-18 DIAGNOSIS — J9621 Acute and chronic respiratory failure with hypoxia: Secondary | ICD-10-CM | POA: Diagnosis not present

## 2020-07-18 DIAGNOSIS — E119 Type 2 diabetes mellitus without complications: Secondary | ICD-10-CM | POA: Diagnosis not present

## 2020-07-18 DIAGNOSIS — J440 Chronic obstructive pulmonary disease with acute lower respiratory infection: Secondary | ICD-10-CM | POA: Diagnosis not present

## 2020-07-18 DIAGNOSIS — I214 Non-ST elevation (NSTEMI) myocardial infarction: Secondary | ICD-10-CM | POA: Diagnosis not present

## 2020-07-18 DIAGNOSIS — Z7984 Long term (current) use of oral hypoglycemic drugs: Secondary | ICD-10-CM | POA: Diagnosis not present

## 2020-07-18 DIAGNOSIS — J441 Chronic obstructive pulmonary disease with (acute) exacerbation: Secondary | ICD-10-CM | POA: Diagnosis not present

## 2020-07-18 DIAGNOSIS — I10 Essential (primary) hypertension: Secondary | ICD-10-CM | POA: Diagnosis not present

## 2020-07-18 DIAGNOSIS — Z9989 Dependence on other enabling machines and devices: Secondary | ICD-10-CM | POA: Diagnosis not present

## 2020-07-18 DIAGNOSIS — Z9981 Dependence on supplemental oxygen: Secondary | ICD-10-CM | POA: Diagnosis not present

## 2020-07-18 DIAGNOSIS — J189 Pneumonia, unspecified organism: Secondary | ICD-10-CM | POA: Diagnosis not present

## 2020-07-18 DIAGNOSIS — G4733 Obstructive sleep apnea (adult) (pediatric): Secondary | ICD-10-CM | POA: Diagnosis not present

## 2020-07-20 DIAGNOSIS — E119 Type 2 diabetes mellitus without complications: Secondary | ICD-10-CM | POA: Diagnosis not present

## 2020-07-20 DIAGNOSIS — J9621 Acute and chronic respiratory failure with hypoxia: Secondary | ICD-10-CM | POA: Diagnosis not present

## 2020-07-20 DIAGNOSIS — I214 Non-ST elevation (NSTEMI) myocardial infarction: Secondary | ICD-10-CM | POA: Diagnosis not present

## 2020-07-20 DIAGNOSIS — J189 Pneumonia, unspecified organism: Secondary | ICD-10-CM | POA: Diagnosis not present

## 2020-07-20 DIAGNOSIS — J440 Chronic obstructive pulmonary disease with acute lower respiratory infection: Secondary | ICD-10-CM | POA: Diagnosis not present

## 2020-07-20 DIAGNOSIS — J441 Chronic obstructive pulmonary disease with (acute) exacerbation: Secondary | ICD-10-CM | POA: Diagnosis not present

## 2020-07-21 ENCOUNTER — Telehealth (INDEPENDENT_AMBULATORY_CARE_PROVIDER_SITE_OTHER): Payer: Medicare Other | Admitting: Family Medicine

## 2020-07-21 ENCOUNTER — Encounter: Payer: Self-pay | Admitting: Family Medicine

## 2020-07-21 DIAGNOSIS — J069 Acute upper respiratory infection, unspecified: Secondary | ICD-10-CM | POA: Diagnosis not present

## 2020-07-21 NOTE — Assessment & Plan Note (Signed)
  She does not have symptoms suggestive of flu or COVID at this time.  Recommend supportive and symptomatic management of symptoms.  She may have an allergy component based on symptoms of itchy, watery eyes and sneezing.  She can try Coricidin, the chlorpheniramine may provide some benefit for allergies and congestion. Continue current medications for COPD.   Instructed to contact clinic if starting to develop lower respiratory symptoms, fever, or other systemic symptoms.

## 2020-07-21 NOTE — Progress Notes (Signed)
[name] - 74 y.o. female MRN [id number]  Date of birth: Oct 04, 1945   This visit type was conducted due to national recommendations for restrictions regarding the COVID-19 Pandemic (e.g. social distancing).  This format is felt to be most appropriate for this patient at this time.  All issues noted in this document were discussed and addressed.  No physical exam was performed (except for noted visual exam findings with Video Visits).  I discussed the limitations of evaluation and management by telemedicine and the availability of in person appointments. The patient expressed understanding and agreed to proceed.  I connected with@ on 07/21/20 at  1:20 PM EST by a video enabled telemedicine application and verified that I am speaking with the correct person using two identifiers.  Interactive audio and video telecommunications were attempted between this provider and patient, however failed, due to patient having technical difficulties OR patient did not have access to video capability.  We continued and completed visit with audio only.    Present at visit: Luetta Nutting, DO [name]   Patient Location: Home [id number] [place] [id number]   Provider location:   [place]  No chief complaint on file.   HPI  [name] is a 74 y.o. female who presents via audio/video conferencing for a telehealth visit today.  She has complaint of sneezing, itchy and watery eyes.  Woke up this morning with matting to bilateral eyes.  She denies eye pain.  She has not had cough, fever, chills, shortness of breath, body aches, or headache.  She has history of COPD and is a current smoker, no increased wheezing or sputum production.  She has not tried anything for treatment at home so far.     ROS:  A comprehensive ROS was completed and negative except as noted per HPI  Past Medical History:  Diagnosis Date  . Arthritis   . Charcot-Marie-Tooth disease   . COPD (chronic obstructive  pulmonary disease) ([place])   . DDD (degenerative disc disease)    Lumbar and lumbosacral  . Depression   . Diabetes mellitus    type 2  . Diabetic peripheral neuropathy ([place])   . Facet syndrome, lumbar   . Gastric ulcer    w/o hemorrhage  . Hyperlipidemia   . Hypertension   . Insomnia   . Lumbosacral root lesions, not elsewhere classified   . Neurogenic bladder   . Obesity   . Osteopenia   . Other symptoms referable to back   . Post-menopausal   . PUD (peptic ulcer disease)   . 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 Spinal stenosis, lumbar region, without neurogenic claudication   . Thoracic spondylosis without myelopathy   . Thyroid disease    hypo  . Tobacco dependence   . Unspecified hereditary and idiopathic peripheral neuropathy     Past Surgical History:  Procedure Laterality Date  . ANKLE RECONSTRUCTION     left ankle, plate and pins   . bilateral L3 medial branch block    . CHOLECYSTECTOMY    . fibroid tumor removal    . fluoroscopic L5 dorsal medial branch bloc    . RENAL ARTERY STENT  11/28/2010   left  . REPLACEMENT TOTAL KNEE      Family History  Problem Relation Age of Onset  . Stroke Mother   . Heart disease Father 25       heart attack  . Hypertension Father   .  Cancer Father        lung  . Diabetes Paternal Aunt        insulin dependent    Social History   Socioeconomic History  . Marital status: Widowed    Spouse name: Not on file  . Number of children: Not on file  . Years of education: Not on file  . Highest education level: Not on file  Occupational History  . Not on file  Tobacco Use  . Smoking status: Current Every Day Smoker    Packs/day: 0.75    Years: 49.00    Pack years: 36.75    Types: Cigarettes  . Smokeless tobacco: Never Used  . Tobacco comment: pt states she is going to try the E-cig  Substance and Sexual Activity  . Alcohol use: No    Alcohol/week: 0.0 standard drinks  . Drug use: No  .  Sexual activity: Not on file  Other Topics Concern  . Not on file  Social History Narrative  . Not on file   Social Determinants of Health   Financial Resource Strain: Not on file  Food Insecurity: Not on file  Transportation Needs: Not on file  Physical Activity: Not on file  Stress: Not on file  Social Connections: Not on file  Intimate Partner Violence: Not on file     Current Outpatient Medications:  .  albuterol (ACCUNEB) 1.25 MG/3ML nebulizer solution, Take 3 mLs (1.25 mg total) by nebulization every 6 (six) hours as needed for wheezing or shortness of breath., Disp: 75 mL, Rfl: 12 .  alendronate (FOSAMAX) 70 MG tablet, TAKE 1 TABLET BY MOUTH EVERY 7 DAYS. TAKE IN THE MORNING WITH A FULL GLASS OF WATER ON AN EMPTY STOMACH. DO NOT TAKE OTHER FOOD OR DRINK OR LIE DOWN, Disp: 12 tablet, Rfl: 1 .  AMBULATORY NON FORMULARY MEDICATION, Medication Name: Nebulizer machine with equipment.  Diagnosis COPD.  Patient is primarily homebound.  Is faxed to [facility]. She is an established patient., Disp: 1 Units, Rfl: 0 .  AMBULATORY NON FORMULARY MEDICATION, Medication Name: Rollator Dx:, Disp: 1 each, Rfl: 0 .  AMBULATORY NON FORMULARY MEDICATION, Medication Name: Patient needs home health aide.  Please fax order to [facility] who she is currently getting home health services through., Disp: 1 Units, Rfl: 0 .  aspirin EC 81 MG tablet, Take 81 mg by mouth daily., Disp: , Rfl:  .  blood glucose meter kit and supplies KIT, Dispense based on patient and insurance preference. Use up to four times daily as directed Dx E11.40, Disp: 1 each, Rfl: 0 .  chlorpheniramine-HYDROcodone (TUSSIONEX PENNKINETIC ER) 10-8 MG/5ML SUER, Take 5 mLs by mouth at bedtime as needed for cough., Disp: 70 mL, Rfl: 0 .  clopidogrel (PLAVIX) 75 MG tablet, TAKE ONE TABLET BY MOUTH EVERY DAY, Disp: 30 tablet, Rfl: 11 .  dicyclomine (BENTYL) 20 MG tablet, TAKE ONE TABLET BY MOUTH THREE TIMES DAILY AS NEEDED FOR SPASMS,  Disp: 90 tablet, Rfl: 2 .  diphenoxylate-atropine (LOMOTIL) 2.5-0.025 MG tablet, TAKE ONE OR TWO TABLETS BY MOUTH TWICE DAILY AS NEEDED FOR DIARRHEA OR LOOSE STOOLS, Disp: 60 tablet, Rfl: 5 .  FLUoxetine (PROZAC) 40 MG capsule, Take 2 capsules (80 mg total) by mouth daily., Disp: 180 capsule, Rfl: 2 .  furosemide (LASIX) 40 MG tablet, Take 1 or 2 tablets by mouth every morning/LABS FOR REFILLS., Disp: 90 tablet, Rfl: 0 .  gabapentin (NEURONTIN) 400 MG capsule, Take 400 mg with breakfast, lunch and  dinner. Take 800 mg before bed, Disp: , Rfl:  .  GAMUNEX-C 10 GM/100ML SOLN, , Disp: , Rfl:  .  GAMUNEX-C 20 GM/200ML SOLN, , Disp: , Rfl:  .  insulin glargine (LANTUS SOLOSTAR) 100 UNIT/ML Solostar Pen, Inject 10-15 Units into the skin at bedtime., Disp: 15 mL, Rfl: 2 .  Lancets MISC, by Does not apply route., Disp: , Rfl:  .  levalbuterol (XOPENEX) 0.63 MG/3ML nebulizer solution, Take 3 mLs (0.63 mg total) by nebulization every 8 (eight) hours as needed for wheezing or shortness of breath., Disp: 60 mL, Rfl: 3 .  levothyroxine (SYNTHROID) 125 MCG tablet, Take 1 tablet (125 mcg total) by mouth daily., Disp: 30 tablet, Rfl: 2 .  lidocaine (XYLOCAINE) 5 % ointment, Apply 1 application topically daily. 2 hours before skin procedure., Disp: 35.44 g, Rfl: 11 .  meclizine (ANTIVERT) 25 MG tablet, Take 1 tablet (25 mg total) by mouth 3 (three) times daily as needed for dizziness., Disp: 30 tablet, Rfl: 6 .  metFORMIN (GLUCOPHAGE) 500 MG tablet, TAKE ONE TABLET BY MOUTH THREE TIMES DAILY, Disp: 270 tablet, Rfl: 1 .  Multiple Vitamins-Minerals (PRESERVISION AREDS 2) CAPS, , Disp: , Rfl:  .  mycophenolate (CELLCEPT) 500 MG tablet, Take by mouth., Disp: , Rfl: 5 .  nitroGLYCERIN (NITROSTAT) 0.4 MG SL tablet, Place 1 tablet (0.4 mg total) under the tongue every 5 (five) minutes as needed., Disp: 10 tablet, Rfl: 2 .  PRODIGY NO CODING BLOOD GLUC test strip, USE AS DIRECTED, Disp: 100 each, Rfl: 0 .  simvastatin  (ZOCOR) 40 MG tablet, TAKE ONE TABLET BY MOUTH EVERY DAY AT SIX IN THE EVENING, Disp: 90 tablet, Rfl: 3 .  TRELEGY ELLIPTA 100-62.5-25 MCG/INH AEPB, Inhale 1 puff into the lungs every morning., Disp: 60 each, Rfl: 5 .  TRUEPLUS PEN NEEDLES 32G X 4 MM MISC, USE WHEN INJECTING INSULIN DAILY, Disp: 100 each, Rfl: 6 .  zolpidem (AMBIEN) 5 MG tablet, Take 1 tablet (5 mg total) by mouth at bedtime., Disp: 90 tablet, Rfl: 0  EXAM:  VITALS per patient if applicable: There were no vitals taken for this visit.  GENERAL: alert, oriented, appears well and in no acute distress  LUNGS:  no signs of respiratory distress, breathing rate appears normal, no obvious gross SOB, gasping or wheezing  PSYCH/NEURO: pleasant and cooperative, no obvious depression or anxiety, speech and thought processing grossly intact  ASSESSMENT AND PLAN:  Discussed the following assessment and plan:  URI (upper respiratory infection)  She does not have symptoms suggestive of flu or COVID at this time.  Recommend supportive and symptomatic management of symptoms.  She may have an allergy component based on symptoms of itchy, watery eyes and sneezing.  She can try Coricidin, the chlorpheniramine may provide some benefit for allergies and congestion. Continue current medications for COPD.   Instructed to contact clinic if starting to develop lower respiratory symptoms, fever, or other systemic symptoms.    20 minutes spent including pre visit preparation, review of prior notes and labs, encounter with patient via telephone visit and same day documentation.    I discussed the assessment and treatment plan with the patient. The patient was provided an opportunity to ask questions and all were answered. The patient agreed with the plan and demonstrated an understanding of the instructions.   The patient was advised to call back or seek an in-person evaluation if the symptoms worsen or if the condition fails to improve as  anticipated.  Luetta Nutting, DO

## 2020-07-21 NOTE — Progress Notes (Signed)
Symptoms started 12/12  Sneezing Runny nose Watery Eyes

## 2020-07-26 DIAGNOSIS — J9621 Acute and chronic respiratory failure with hypoxia: Secondary | ICD-10-CM | POA: Diagnosis not present

## 2020-07-26 DIAGNOSIS — E119 Type 2 diabetes mellitus without complications: Secondary | ICD-10-CM | POA: Diagnosis not present

## 2020-07-26 DIAGNOSIS — J189 Pneumonia, unspecified organism: Secondary | ICD-10-CM | POA: Diagnosis not present

## 2020-07-26 DIAGNOSIS — J440 Chronic obstructive pulmonary disease with acute lower respiratory infection: Secondary | ICD-10-CM | POA: Diagnosis not present

## 2020-07-26 DIAGNOSIS — I214 Non-ST elevation (NSTEMI) myocardial infarction: Secondary | ICD-10-CM | POA: Diagnosis not present

## 2020-07-26 DIAGNOSIS — J441 Chronic obstructive pulmonary disease with (acute) exacerbation: Secondary | ICD-10-CM | POA: Diagnosis not present

## 2020-08-02 DIAGNOSIS — I214 Non-ST elevation (NSTEMI) myocardial infarction: Secondary | ICD-10-CM | POA: Diagnosis not present

## 2020-08-02 DIAGNOSIS — J9621 Acute and chronic respiratory failure with hypoxia: Secondary | ICD-10-CM | POA: Diagnosis not present

## 2020-08-02 DIAGNOSIS — J441 Chronic obstructive pulmonary disease with (acute) exacerbation: Secondary | ICD-10-CM | POA: Diagnosis not present

## 2020-08-02 DIAGNOSIS — J189 Pneumonia, unspecified organism: Secondary | ICD-10-CM | POA: Diagnosis not present

## 2020-08-02 DIAGNOSIS — E119 Type 2 diabetes mellitus without complications: Secondary | ICD-10-CM | POA: Diagnosis not present

## 2020-08-02 DIAGNOSIS — J440 Chronic obstructive pulmonary disease with acute lower respiratory infection: Secondary | ICD-10-CM | POA: Diagnosis not present

## 2020-08-04 DIAGNOSIS — E119 Type 2 diabetes mellitus without complications: Secondary | ICD-10-CM | POA: Diagnosis not present

## 2020-08-04 DIAGNOSIS — J440 Chronic obstructive pulmonary disease with acute lower respiratory infection: Secondary | ICD-10-CM | POA: Diagnosis not present

## 2020-08-04 DIAGNOSIS — J9621 Acute and chronic respiratory failure with hypoxia: Secondary | ICD-10-CM | POA: Diagnosis not present

## 2020-08-04 DIAGNOSIS — I214 Non-ST elevation (NSTEMI) myocardial infarction: Secondary | ICD-10-CM | POA: Diagnosis not present

## 2020-08-04 DIAGNOSIS — J441 Chronic obstructive pulmonary disease with (acute) exacerbation: Secondary | ICD-10-CM | POA: Diagnosis not present

## 2020-08-04 DIAGNOSIS — J189 Pneumonia, unspecified organism: Secondary | ICD-10-CM | POA: Diagnosis not present

## 2020-08-05 ENCOUNTER — Ambulatory Visit: Payer: Medicare Other | Admitting: Family Medicine

## 2020-08-10 DIAGNOSIS — J9621 Acute and chronic respiratory failure with hypoxia: Secondary | ICD-10-CM | POA: Diagnosis not present

## 2020-08-10 DIAGNOSIS — J440 Chronic obstructive pulmonary disease with acute lower respiratory infection: Secondary | ICD-10-CM | POA: Diagnosis not present

## 2020-08-10 DIAGNOSIS — J441 Chronic obstructive pulmonary disease with (acute) exacerbation: Secondary | ICD-10-CM | POA: Diagnosis not present

## 2020-08-10 DIAGNOSIS — J189 Pneumonia, unspecified organism: Secondary | ICD-10-CM | POA: Diagnosis not present

## 2020-08-10 DIAGNOSIS — E119 Type 2 diabetes mellitus without complications: Secondary | ICD-10-CM | POA: Diagnosis not present

## 2020-08-10 DIAGNOSIS — I214 Non-ST elevation (NSTEMI) myocardial infarction: Secondary | ICD-10-CM | POA: Diagnosis not present

## 2020-08-13 DIAGNOSIS — E119 Type 2 diabetes mellitus without complications: Secondary | ICD-10-CM | POA: Diagnosis not present

## 2020-08-13 DIAGNOSIS — J189 Pneumonia, unspecified organism: Secondary | ICD-10-CM | POA: Diagnosis not present

## 2020-08-13 DIAGNOSIS — J9621 Acute and chronic respiratory failure with hypoxia: Secondary | ICD-10-CM | POA: Diagnosis not present

## 2020-08-13 DIAGNOSIS — I214 Non-ST elevation (NSTEMI) myocardial infarction: Secondary | ICD-10-CM | POA: Diagnosis not present

## 2020-08-13 DIAGNOSIS — J441 Chronic obstructive pulmonary disease with (acute) exacerbation: Secondary | ICD-10-CM | POA: Diagnosis not present

## 2020-08-13 DIAGNOSIS — J440 Chronic obstructive pulmonary disease with acute lower respiratory infection: Secondary | ICD-10-CM | POA: Diagnosis not present

## 2020-08-16 ENCOUNTER — Telehealth: Payer: Self-pay

## 2020-08-16 NOTE — Telephone Encounter (Signed)
[name] with [facility] called and states [name] would like to DC her oxygen. I advised she is on a CPAP with oxygen and needs this at night. I advised she needs to schedule an appointment ASAP.

## 2020-08-17 DIAGNOSIS — E119 Type 2 diabetes mellitus without complications: Secondary | ICD-10-CM | POA: Diagnosis not present

## 2020-08-17 DIAGNOSIS — Z794 Long term (current) use of insulin: Secondary | ICD-10-CM | POA: Diagnosis not present

## 2020-08-17 DIAGNOSIS — Z9981 Dependence on supplemental oxygen: Secondary | ICD-10-CM | POA: Diagnosis not present

## 2020-08-17 DIAGNOSIS — Z9989 Dependence on other enabling machines and devices: Secondary | ICD-10-CM | POA: Diagnosis not present

## 2020-08-17 DIAGNOSIS — I10 Essential (primary) hypertension: Secondary | ICD-10-CM | POA: Diagnosis not present

## 2020-08-17 DIAGNOSIS — J441 Chronic obstructive pulmonary disease with (acute) exacerbation: Secondary | ICD-10-CM | POA: Diagnosis not present

## 2020-08-17 DIAGNOSIS — F17210 Nicotine dependence, cigarettes, uncomplicated: Secondary | ICD-10-CM | POA: Diagnosis not present

## 2020-08-17 DIAGNOSIS — G6181 Chronic inflammatory demyelinating polyneuritis: Secondary | ICD-10-CM | POA: Diagnosis not present

## 2020-08-17 DIAGNOSIS — G4733 Obstructive sleep apnea (adult) (pediatric): Secondary | ICD-10-CM | POA: Diagnosis not present

## 2020-08-17 DIAGNOSIS — F32A Depression, unspecified: Secondary | ICD-10-CM | POA: Diagnosis not present

## 2020-08-17 DIAGNOSIS — I2510 Atherosclerotic heart disease of native coronary artery without angina pectoris: Secondary | ICD-10-CM | POA: Diagnosis not present

## 2020-08-17 DIAGNOSIS — J9621 Acute and chronic respiratory failure with hypoxia: Secondary | ICD-10-CM | POA: Diagnosis not present

## 2020-08-18 DIAGNOSIS — E119 Type 2 diabetes mellitus without complications: Secondary | ICD-10-CM | POA: Diagnosis not present

## 2020-08-18 DIAGNOSIS — G6181 Chronic inflammatory demyelinating polyneuritis: Secondary | ICD-10-CM | POA: Diagnosis not present

## 2020-08-18 DIAGNOSIS — J441 Chronic obstructive pulmonary disease with (acute) exacerbation: Secondary | ICD-10-CM | POA: Diagnosis not present

## 2020-08-18 DIAGNOSIS — I2510 Atherosclerotic heart disease of native coronary artery without angina pectoris: Secondary | ICD-10-CM | POA: Diagnosis not present

## 2020-08-18 DIAGNOSIS — I10 Essential (primary) hypertension: Secondary | ICD-10-CM | POA: Diagnosis not present

## 2020-08-18 DIAGNOSIS — J9621 Acute and chronic respiratory failure with hypoxia: Secondary | ICD-10-CM | POA: Diagnosis not present

## 2020-08-18 NOTE — Telephone Encounter (Signed)
Spoke w/Pam at [employer] and asked about the tubing. She informed me that it comes in either 25 ft or 50 ft. She said that she will get a 25 ft hose sent out to her.   Called pt back and informed her of this.

## 2020-08-18 NOTE — Telephone Encounter (Signed)
Pt reports that her O2 readings have been good.   The issue is that the hose is really long and and she will trip over the hose and she sometimes will take the oxygen off to do certain things around her apartment.   She stated that she will continue to use the Oxygen.  I asked her if she knew whether or not she could possibly get a shorter hose. She said that she was not sure about this. I advised her that I would look into this for her and let her know if anything can be done about this.   Called [facility] [phone] to see if there was something that could be done about this and was informed that they don't do her O2 supplies.

## 2020-08-18 NOTE — Telephone Encounter (Signed)
Tried to call patient, no answer and no voicemail @ 11:58am.

## 2020-08-18 NOTE — Telephone Encounter (Signed)
She really needs to keep the oxygen I am not sure why she is wanting to get rid of it is there a specific problem or concern for why she wants to get rid of it.  Otherwise I agree she would need an appointment we will need to do a walk test etc.

## 2020-08-21 ENCOUNTER — Other Ambulatory Visit: Payer: Self-pay | Admitting: Family Medicine

## 2020-08-25 DIAGNOSIS — E119 Type 2 diabetes mellitus without complications: Secondary | ICD-10-CM | POA: Diagnosis not present

## 2020-08-25 DIAGNOSIS — G6181 Chronic inflammatory demyelinating polyneuritis: Secondary | ICD-10-CM | POA: Diagnosis not present

## 2020-08-25 DIAGNOSIS — J441 Chronic obstructive pulmonary disease with (acute) exacerbation: Secondary | ICD-10-CM | POA: Diagnosis not present

## 2020-08-25 DIAGNOSIS — J9621 Acute and chronic respiratory failure with hypoxia: Secondary | ICD-10-CM | POA: Diagnosis not present

## 2020-08-25 DIAGNOSIS — I2510 Atherosclerotic heart disease of native coronary artery without angina pectoris: Secondary | ICD-10-CM | POA: Diagnosis not present

## 2020-08-25 DIAGNOSIS — I10 Essential (primary) hypertension: Secondary | ICD-10-CM | POA: Diagnosis not present

## 2020-08-27 DIAGNOSIS — J441 Chronic obstructive pulmonary disease with (acute) exacerbation: Secondary | ICD-10-CM | POA: Diagnosis not present

## 2020-08-27 DIAGNOSIS — I10 Essential (primary) hypertension: Secondary | ICD-10-CM | POA: Diagnosis not present

## 2020-08-27 DIAGNOSIS — E119 Type 2 diabetes mellitus without complications: Secondary | ICD-10-CM | POA: Diagnosis not present

## 2020-08-27 DIAGNOSIS — G6181 Chronic inflammatory demyelinating polyneuritis: Secondary | ICD-10-CM | POA: Diagnosis not present

## 2020-08-27 DIAGNOSIS — I2510 Atherosclerotic heart disease of native coronary artery without angina pectoris: Secondary | ICD-10-CM | POA: Diagnosis not present

## 2020-08-27 DIAGNOSIS — J9621 Acute and chronic respiratory failure with hypoxia: Secondary | ICD-10-CM | POA: Diagnosis not present

## 2020-09-01 ENCOUNTER — Other Ambulatory Visit: Payer: Self-pay | Admitting: Family Medicine

## 2020-09-02 DIAGNOSIS — I10 Essential (primary) hypertension: Secondary | ICD-10-CM | POA: Diagnosis not present

## 2020-09-02 DIAGNOSIS — J9621 Acute and chronic respiratory failure with hypoxia: Secondary | ICD-10-CM | POA: Diagnosis not present

## 2020-09-02 DIAGNOSIS — E119 Type 2 diabetes mellitus without complications: Secondary | ICD-10-CM | POA: Diagnosis not present

## 2020-09-02 DIAGNOSIS — I2510 Atherosclerotic heart disease of native coronary artery without angina pectoris: Secondary | ICD-10-CM | POA: Diagnosis not present

## 2020-09-02 DIAGNOSIS — G6181 Chronic inflammatory demyelinating polyneuritis: Secondary | ICD-10-CM | POA: Diagnosis not present

## 2020-09-02 DIAGNOSIS — J441 Chronic obstructive pulmonary disease with (acute) exacerbation: Secondary | ICD-10-CM | POA: Diagnosis not present

## 2020-09-09 ENCOUNTER — Telehealth: Payer: Self-pay

## 2020-09-09 NOTE — Telephone Encounter (Signed)
Okay, sounds good okay to restart the Metformin. We can always work her up further if needed if the diarrhea continues

## 2020-09-09 NOTE — Telephone Encounter (Signed)
[name] states she is still having diarrhea. She states she would like to restart the Metformin since the diarrhea has not resolved. She states her blood sugars are in the 200 and 400.   She has been scheduled for next week. I tried to get her to come in. She states she can't come in the office.

## 2020-09-10 NOTE — Telephone Encounter (Signed)
Patient advised.

## 2020-09-13 ENCOUNTER — Telehealth (INDEPENDENT_AMBULATORY_CARE_PROVIDER_SITE_OTHER): Payer: Medicare Other | Admitting: Family Medicine

## 2020-09-13 ENCOUNTER — Telehealth: Payer: Self-pay | Admitting: Family Medicine

## 2020-09-13 DIAGNOSIS — R112 Nausea with vomiting, unspecified: Secondary | ICD-10-CM

## 2020-09-13 DIAGNOSIS — R197 Diarrhea, unspecified: Secondary | ICD-10-CM

## 2020-09-13 DIAGNOSIS — E1140 Type 2 diabetes mellitus with diabetic neuropathy, unspecified: Secondary | ICD-10-CM

## 2020-09-13 MED ORDER — ONDANSETRON 4 MG PO TBDP: 4.0000 mg | ORAL_TABLET | Freq: Three times a day (TID) | ORAL | 0 refills | Status: DC | PRN

## 2020-09-13 NOTE — Progress Notes (Signed)
Spoke with patient. She stated that she has a massive case of diarrhea,nausea and vomiting x 3 days. She feels that the diarrhea has gotten better. Her last episode was last night and it was watery. Her last vomiting episode was last night also and she is afraid to really anything because she will either vomit or have diarrhea. She ran out of the lomotil and will need a refill. She has had some chills which get worse after an episode of diarrhea.   I advised her that she should try to eat bland foods and stay hydrated. She said that she couldn't eat any crackers because he BS would go up. I told her that she should eat something and to make sure to drink some fluids because if she doesn't she will become dehydrated. She said that she believes that she is already dehydrated. I told her that she would probably need to go to the ED for fluids she informed me that her infusion nurse comes on either Wednesday/thursday and can give her fluids and that she could wait until then.   She stated that her Blood sugars are coming down since her d/c from the hospital.

## 2020-09-13 NOTE — Telephone Encounter (Signed)
Was told to fax labs to [phone] Confirmation received

## 2020-09-13 NOTE — Telephone Encounter (Signed)
Please call [facility] ( getting services through them) and see if they can go out and draw blood.  Needs  CBC w/ diff, CMP, UA with culture.

## 2020-09-13 NOTE — Progress Notes (Signed)
Virtual Visit via Telephone Note  I connected with [name] on 09/14/20 at  9:30 AM EST by telephone and verified that I am speaking with the correct person using two identifiers.   I discussed the limitations, risks, security and privacy concerns of performing an evaluation and management service by telephone and the availability of in person appointments. I also discussed with the patient that there may be a patient responsible charge related to this service. The patient expressed understanding and agreed to proceed.  Patient location: at home.  Provider loccation: In office   Subjective:    CC: Diarrhea.   HPI: Spoke with patient. She stated that she has a massive case of diarrhea,nausea and vomiting x 3 days. She feels that the diarrhea has gotten better. Her last episode was last night and it was watery. Her last vomiting episode was last night also and she is afraid to really anything because she will either vomit or have diarrhea. She ran out of the lomotil and will need a refill. She has had some chills which get worse after an episode of diarrhea. no blood in the stool. She is living off of jello. She is keeping fluids down OK.    She stated that her Blood sugars are coming down since her d/c from the hospital.   They had held her Metformin but she has now restarted it she does not feel like the diarrhea has been worse or triggered by the metformin she is always tolerated it well previously.   Getting home health [facility].   Past medical history, Surgical history, Family history not pertinant except as noted below, Social history, Allergies, and medications have been entered into the medical record, reviewed, and corrections made.   Review of Systems: No fevers, chills, night sweats, weight loss, chest pain, or shortness of breath.   Objective:    General: Speaking clearly in complete sentences without any shortness of breath.  Alert and oriented x3.   Normal judgment. No apparent acute distress.    Impression and Recommendations:    Diarrhea/Vomiting -unclear etiology.  It is possible that she has some type of food poisoning versus gastroenteritis though it seems a little bit prolonged for gastroenteritis.  We discussed getting a stool culture if she has another episode of diarrhea.  Also sent her for some nausea medicine for her to use as needed encouraged her to really work on staying hydrated so that she does not end up back in the emergency department.  Since she has been added in and out of the hospital we will check for C. difficile as well.  We will call [facility] and see if they can go out and draw CBC with differential, CMP and UA with culture also when I rule out a UTI  Did go ahead and refill her Lomotil which she uses more for chronic loose stools.     I discussed the assessment and treatment plan with the patient. The patient was provided an opportunity to ask questions and all were answered. The patient agreed with the plan and demonstrated an understanding of the instructions.   The patient was advised to call back or seek an in-person evaluation if the symptoms worsen or if the condition fails to improve as anticipated.  I provided 25 minutes of non-face-to-face time during this encounter.   [name], MD

## 2020-09-14 ENCOUNTER — Telehealth: Payer: Medicare Other | Admitting: Family Medicine

## 2020-09-14 ENCOUNTER — Encounter: Payer: Self-pay | Admitting: Family Medicine

## 2020-09-16 ENCOUNTER — Other Ambulatory Visit: Payer: Self-pay | Admitting: Family Medicine

## 2020-09-17 ENCOUNTER — Encounter: Payer: Self-pay | Admitting: Nurse Practitioner

## 2020-09-17 ENCOUNTER — Ambulatory Visit (INDEPENDENT_AMBULATORY_CARE_PROVIDER_SITE_OTHER): Payer: Medicare Other | Admitting: Nurse Practitioner

## 2020-09-17 ENCOUNTER — Other Ambulatory Visit: Payer: Self-pay

## 2020-09-17 VITALS — BP 107/72 | HR 67 | Temp 99.0°F | Ht 67.0 in | Wt 184.0 lb

## 2020-09-17 DIAGNOSIS — R197 Diarrhea, unspecified: Secondary | ICD-10-CM

## 2020-09-17 DIAGNOSIS — E1140 Type 2 diabetes mellitus with diabetic neuropathy, unspecified: Secondary | ICD-10-CM | POA: Diagnosis not present

## 2020-09-17 DIAGNOSIS — K649 Unspecified hemorrhoids: Secondary | ICD-10-CM | POA: Diagnosis not present

## 2020-09-17 DIAGNOSIS — R112 Nausea with vomiting, unspecified: Secondary | ICD-10-CM | POA: Diagnosis not present

## 2020-09-17 LAB — CBC WITH DIFFERENTIAL/PLATELET
Absolute Monocytes: 455 cells/uL (ref 200–950)
Basophils Absolute: 28 cells/uL (ref 0–200)
Basophils Relative: 0.4 %
Eosinophils Absolute: 0 cells/uL — ABNORMAL LOW (ref 15–500)
Eosinophils Relative: 0 %
HCT: 36.7 % (ref 35.0–45.0)
Hemoglobin: 12.4 g/dL (ref 11.7–15.5)
Lymphs Abs: 1176 cells/uL (ref 850–3900)
MCH: 29.5 pg (ref 27.0–33.0)
MCHC: 33.8 g/dL (ref 32.0–36.0)
MCV: 87.2 fL (ref 80.0–100.0)
MPV: 13.1 fL — ABNORMAL HIGH (ref 7.5–12.5)
Monocytes Relative: 6.5 %
Neutro Abs: 5341 cells/uL (ref 1500–7800)
Neutrophils Relative %: 76.3 %
Platelets: 282 10*3/uL (ref 140–400)
RBC: 4.21 10*6/uL (ref 3.80–5.10)
RDW: 13 % (ref 11.0–15.0)
Total Lymphocyte: 16.8 %
WBC: 7 10*3/uL (ref 3.8–10.8)

## 2020-09-17 LAB — COMPLETE METABOLIC PANEL WITH GFR
AG Ratio: 0.6 (calc) — ABNORMAL LOW (ref 1.0–2.5)
ALT: 4 U/L — ABNORMAL LOW (ref 6–29)
AST: 10 U/L (ref 10–35)
Albumin: 3.3 g/dL — ABNORMAL LOW (ref 3.6–5.1)
Alkaline phosphatase (APISO): 71 U/L (ref 37–153)
BUN/Creatinine Ratio: 17 (calc) (ref 6–22)
BUN: 18 mg/dL (ref 7–25)
CO2: 27 mmol/L (ref 20–32)
Calcium: 8.4 mg/dL — ABNORMAL LOW (ref 8.6–10.4)
Chloride: 94 mmol/L — ABNORMAL LOW (ref 98–110)
Creat: 1.03 mg/dL — ABNORMAL HIGH (ref 0.60–0.93)
GFR, Est African American: 61 mL/min/{1.73_m2} (ref >=60)
GFR, Est Non African American: 53 mL/min/{1.73_m2} — ABNORMAL LOW (ref >=60)
Globulin: 5.5 g/dL (calc) — ABNORMAL HIGH (ref 1.9–3.7)
Glucose, Bld: 175 mg/dL — ABNORMAL HIGH (ref 65–139)
Potassium: 2.8 mmol/L — ABNORMAL LOW (ref 3.5–5.3)
Sodium: 131 mmol/L — ABNORMAL LOW (ref 135–146)
Total Bilirubin: 1.2 mg/dL (ref 0.2–1.2)
Total Protein: 8.8 g/dL — ABNORMAL HIGH (ref 6.1–8.1)

## 2020-09-17 LAB — GLUCOSE, POCT (MANUAL RESULT ENTRY): POC Glucose: 200 mg/dl — AB (ref 70–99)

## 2020-09-17 LAB — LIPASE: Lipase: 15 U/L (ref 7–60)

## 2020-09-17 MED ORDER — PHENYLEPHRINE-MINERAL OIL-PET 0.25-14-74.9 % RE OINT: 1.0000 "application " | TOPICAL_OINTMENT | Freq: Two times a day (BID) | RECTAL | 2 refills | Status: DC | PRN

## 2020-09-17 NOTE — Patient Instructions (Addendum)
We will definitely know more once we have the test results back. Today we will look at your white blood counts to see if they are elevated to indicate infection. I also want to look at your electrolytes and kidney function to make sure that you are not dehydrated or the diarrhea has not caused an imbalance in your electrolytes.   Please bring the urine and stool specimen to the lab downstairs as soon as possible so we can get that testing run.   Diarrhea, Adult Diarrhea is frequent loose and watery bowel movements. Diarrhea can make you feel weak and cause you to become dehydrated. Dehydration can make you tired and thirsty, cause you to have a dry mouth, and decrease how often you urinate. Diarrhea typically lasts 2-3 days. However, it can last longer if it is a sign of something more serious. It is important to treat your diarrhea as told by your health care provider. Follow these instructions at home: Eating and drinking Follow these recommendations as told by your health care provider:  Take an oral rehydration solution (ORS). This is an over-the-counter medicine that helps return your body to its normal balance of nutrients and water. It is found at pharmacies and retail stores.  Drink plenty of fluids, such as water, ice chips, diluted fruit juice, and low-calorie sports drinks. You can drink milk also, if desired.  Avoid drinking fluids that contain a lot of sugar or caffeine, such as energy drinks, sports drinks, and soda.  Eat bland, easy-to-digest foods in small amounts as you are able. These foods include bananas, applesauce, rice, lean meats, toast, and crackers.  Avoid alcohol.  Avoid spicy or fatty foods.      Medicines  Take over-the-counter and prescription medicines only as told by your health care provider.  If you were prescribed an antibiotic medicine, take it as told by your health care provider. Do not stop using the antibiotic even if you start to feel  better. General instructions  Wash your hands often using soap and water. If soap and water are not available, use a hand sanitizer. Others in the household should wash their hands as well. Hands should be washed: ? After using the toilet or changing a diaper. ? Before preparing, cooking, or serving food. ? While caring for a sick person or while visiting someone in a hospital.  Drink enough fluid to keep your urine pale yellow.  Rest at home while you recover.  Watch your condition for any changes.  Take a warm bath to relieve any burning or pain from frequent diarrhea episodes.  Keep all follow-up visits as told by your health care provider. This is important.   Contact a health care provider if:  You have a fever.  Your diarrhea gets worse.  You have new symptoms.  You cannot keep fluids down.  You feel light-headed or dizzy.  You have a headache.  You have muscle cramps. Get help right away if:  You have chest pain.  You feel extremely weak or you faint.  You have bloody or black stools or stools that look like tar.  You have severe pain, cramping, or bloating in your abdomen.  You have trouble breathing or you are breathing very quickly.  Your heart is beating very quickly.  Your skin feels cold and clammy.  You feel confused.  You have signs of dehydration, such as: ? Dark urine, very little urine, or no urine. ? Cracked lips. ? Dry mouth. ? Sunken  eyes. ? Sleepiness. ? Weakness. Summary  Diarrhea is frequent loose and watery bowel movements. Diarrhea can make you feel weak and cause you to become dehydrated.  Drink enough fluids to keep your urine pale yellow.  Make sure that you wash your hands after using the toilet. If soap and water are not available, use hand sanitizer.  Contact a health care provider if your diarrhea gets worse or you have new symptoms.  Get help right away if you have signs of dehydration. This information is not  intended to replace advice given to you by your health care provider. Make sure you discuss any questions you have with your health care provider. Document Revised: 12/10/2018 Document Reviewed: 12/28/2017 Elsevier Patient Education  2021 [employer].

## 2020-09-17 NOTE — Progress Notes (Signed)
Acute Office Visit  Subjective:    Patient ID: [name], female    DOB: Jul 13, 1946, 75 y.o.   MRN: [id number]  Chief Complaint  Patient presents with  . Nausea    Onset 2 weeks ago, wakes up every morning with nausea, CBG this morning was 236  . Diarrhea    Onset 2 weeks ago, had one day where she went to the restroom 20+ times    HPI Patient is in today for ongoing concerns with diarrhea and nausea on a daily basis. She also reports her blood sugar was 236 this morning.   She tells me about 2 weeks ago she suddenly started with diarrhea and nausea, decreased appetite, and increased fatigue. She tells me she is living on jello, oyster crackers, and water. She reports that she has dry heaves daily with intermittent vomiting, but this is unusual. Her episodes of diarrhea have been up to 20 times in a single day with small volumes and a consistency of all liquid to unformed soft stools. She reports that she has been experiencing lower abdominal cramping with episodes of diarrhea that feels like "someone is pulling my insides out". She reports the cramping stops with a bowel movement.   She also tells me her hemorrhoids have worsened and are "seeping" but not currently bleeding. She reports that she was given a cream in the hospital that was helpful, but it has a steroid and she is not comfortable using this due to her blood sugars being elevated.   She endorses some burning with urination, but she does not feel that she is urinating any more frequently than usual and denies back pain, chills, small volume voids, or dark urine.   She denies night sweats, fevers, chills, blood in her stool, mucous in her stool, or worsening dizziness (aside from her baseline).   She tells me that she takes lomotil daily, but does not feel it is helpful for her symptoms. She has had hospitalizations recently and was last on antibiotics in November with IV and oral antibiotics.   Past Medical History:   Diagnosis Date  . Arthritis   . Charcot-Marie-Tooth disease   . COPD (chronic obstructive pulmonary disease) ([place])   . DDD (degenerative disc disease)    Lumbar and lumbosacral  . Depression   . Diabetes mellitus    type 2  . Diabetic peripheral neuropathy ([place])   . Facet syndrome, lumbar   . Gastric ulcer    w/o hemorrhage  . Hyperlipidemia   . Hypertension   . Insomnia   . Lumbosacral root lesions, not elsewhere classified   . Neurogenic bladder   . Obesity   . Osteopenia   . Other symptoms referable to back   . Post-menopausal   . PUD (peptic ulcer disease)   . 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 Spinal stenosis, lumbar region, without neurogenic claudication   . Thoracic spondylosis without myelopathy   . Thyroid disease    hypo  . Tobacco dependence   . Unspecified hereditary and idiopathic peripheral neuropathy     Past Surgical History:  Procedure Laterality Date  . ANKLE RECONSTRUCTION     left ankle, plate and pins   . bilateral L3 medial branch block    . CHOLECYSTECTOMY    . fibroid tumor removal    . fluoroscopic L5 dorsal medial branch bloc    . RENAL ARTERY STENT  11/28/2010   left  . REPLACEMENT  TOTAL KNEE      Family History  Problem Relation Age of Onset  . Stroke Mother   . Heart disease Father 52       heart attack  . Hypertension Father   . Cancer Father        lung  . Diabetes Paternal Aunt        insulin dependent    Social History   Socioeconomic History  . Marital status: Widowed    Spouse name: Not on file  . Number of children: Not on file  . Years of education: Not on file  . Highest education level: Not on file  Occupational History  . Not on file  Tobacco Use  . Smoking status: Current Every Day Smoker    Packs/day: 0.75    Years: 49.00    Pack years: 36.75    Types: Cigarettes  . Smokeless tobacco: Never Used  . Tobacco comment: pt states she is going to try the E-cig  Substance and  Sexual Activity  . Alcohol use: No    Alcohol/week: 0.0 standard drinks  . Drug use: No  . Sexual activity: Not on file  Other Topics Concern  . Not on file  Social History Narrative  . Not on file   Social Determinants of Health   Financial Resource Strain: Not on file  Food Insecurity: Not on file  Transportation Needs: Not on file  Physical Activity: Not on file  Stress: Not on file  Social Connections: Not on file  Intimate Partner Violence: Not on file    Outpatient Medications Prior to Visit  Medication Sig Dispense Refill  . albuterol (ACCUNEB) 1.25 MG/3ML nebulizer solution Take 3 mLs (1.25 mg total) by nebulization every 6 (six) hours as needed for wheezing or shortness of breath. 75 mL 12  . alendronate (FOSAMAX) 70 MG tablet TAKE 1 TABLET BY MOUTH EVERY 7 DAYS. TAKE IN THE MORNING WITH A FULL GLASS OF WATER ON AN EMPTY STOMACH. DO NOT TAKE OTHER FOOD OR DRINK OR LIE DOWN 12 tablet 1  . AMBULATORY NON FORMULARY MEDICATION Medication Name: Nebulizer machine with equipment.  Diagnosis COPD.  Patient is primarily homebound.  Is faxed to [facility]. She is an established patient. 1 Units 0  . AMBULATORY NON FORMULARY MEDICATION Medication Name: Rollator Dx: 1 each 0  . AMBULATORY NON FORMULARY MEDICATION Medication Name: Patient needs home health aide.  Please fax order to [facility] home health who she is currently getting home health services through. 1 Units 0  . aspirin EC 81 MG tablet Take 81 mg by mouth daily.    . blood glucose meter kit and supplies KIT Dispense based on patient and insurance preference. Use up to four times daily as directed Dx E11.40 1 each 0  . clopidogrel (PLAVIX) 75 MG tablet TAKE ONE TABLET BY MOUTH EVERY DAY 30 tablet 11  . dicyclomine (BENTYL) 20 MG tablet TAKE ONE TABLET BY MOUTH THREE TIMES DAILY AS NEEDED FOR SPASMS 90 tablet 2  . diphenoxylate-atropine (LOMOTIL) 2.5-0.025 MG tablet TAKE ONE OR TWO TABLETS BY MOUTH TWICE DAILY AS NEEDED FOR DIARRHEA  OR LOOSE STOOLS 60 tablet 5  . FLUoxetine (PROZAC) 40 MG capsule Take 2 capsules (80 mg total) by mouth daily. 180 capsule 2  . furosemide (LASIX) 40 MG tablet Take 1 or 2 tablets by mouth every morning/LABS FOR REFILLS. 90 tablet 0  . gabapentin (NEURONTIN) 400 MG capsule Take 400 mg with breakfast, lunch and dinner. Take 800  mg before bed    . GAMUNEX-C 20 GM/200ML SOLN     . insulin glargine (LANTUS SOLOSTAR) 100 UNIT/ML Solostar Pen Inject 10-15 Units into the skin at bedtime. 15 mL 2  . Lancets MISC by Does not apply route.    . levalbuterol (XOPENEX) 0.63 MG/3ML nebulizer solution Take 3 mLs (0.63 mg total) by nebulization every 8 (eight) hours as needed for wheezing or shortness of breath. 60 mL 3  . levothyroxine (SYNTHROID) 125 MCG tablet Take 1 tablet (125 mcg total) by mouth daily. 30 tablet 2  . lidocaine (XYLOCAINE) 5 % ointment Apply 1 application topically daily. 2 hours before skin procedure. 35.44 g 11  . meclizine (ANTIVERT) 25 MG tablet Take 1 tablet (25 mg total) by mouth 3 (three) times daily as needed for dizziness. 30 tablet 6  . metFORMIN (GLUCOPHAGE) 500 MG tablet TAKE ONE TABLET BY MOUTH THREE TIMES DAILY 270 tablet 1  . Multiple Vitamins-Minerals (PRESERVISION AREDS 2) CAPS     . mycophenolate (CELLCEPT) 500 MG tablet Take by mouth.  5  . nitroGLYCERIN (NITROSTAT) 0.4 MG SL tablet Place 1 tablet (0.4 mg total) under the tongue every 5 (five) minutes as needed. 10 tablet 2  . ondansetron (ZOFRAN ODT) 4 MG disintegrating tablet Take 1 tablet (4 mg total) by mouth every 8 (eight) hours as needed for nausea or vomiting. 20 tablet 0  . PRODIGY NO CODING BLOOD GLUC test strip USE AS DIRECTED 100 each 0  . simvastatin (ZOCOR) 40 MG tablet TAKE ONE TABLET BY MOUTH EVERY DAY AT SIX IN THE EVENING 90 tablet 3  . TRELEGY ELLIPTA 100-62.5-25 MCG/INH AEPB Inhale 1 puff into the lungs every morning. 60 each 5  . TRUEPLUS PEN NEEDLES 32G X 4 MM MISC USE WHEN INJECTING INSULIN DAILY 100  each 6  . zolpidem (AMBIEN) 5 MG tablet Take 1 tablet (5 mg total) by mouth at bedtime. 90 tablet 0  . hydrocortisone (ANUSOL-HC) 2.5 % rectal cream Place rectally 2 (two) times daily.    . metoprolol tartrate (LOPRESSOR) 50 MG tablet Take 50 mg by mouth 2 (two) times daily.     No facility-administered medications prior to visit.    Allergies  Allergen Reactions  . Varenicline Nausea And Vomiting    Review of Systems All review of systems negative except what is listed in the HPI     Objective:    Physical Exam Vitals and nursing note reviewed.  Constitutional:      Appearance: She is ill-appearing.  HENT:     Head: Normocephalic.     Mouth/Throat:     Mouth: Mucous membranes are moist.     Pharynx: Oropharynx is clear.  Eyes:     Extraocular Movements: Extraocular movements intact.     Conjunctiva/sclera: Conjunctivae normal.     Pupils: Pupils are equal, round, and reactive to light.  Neck:     Vascular: No carotid bruit.  Cardiovascular:     Rate and Rhythm: Normal rate and regular rhythm.     Pulses: Normal pulses.     Heart sounds: Normal heart sounds.  Pulmonary:     Effort: Pulmonary effort is normal.     Breath sounds: Normal breath sounds.  Abdominal:     General: Bowel sounds are increased. There is no distension.     Palpations: Abdomen is soft. There is no hepatomegaly, splenomegaly or mass.     Tenderness: There is no abdominal tenderness. There is no right CVA tenderness,  left CVA tenderness, guarding or rebound.     Hernia: No hernia is present.  Musculoskeletal:     Cervical back: Normal range of motion.     Right lower leg: No edema.     Left lower leg: No edema.  Lymphadenopathy:     Cervical: No cervical adenopathy.  Skin:    General: Skin is warm and dry.     Capillary Refill: Capillary refill takes less than 2 seconds.     Coloration: Skin is pale.  Neurological:     General: No focal deficit present.     Mental Status: She is alert and  oriented to person, place, and time.     Motor: Weakness present.  Psychiatric:        Mood and Affect: Mood normal.        Behavior: Behavior normal.        Thought Content: Thought content normal.        Judgment: Judgment normal.     BP 107/72   Pulse 67   Temp 99 F (37.2 C)   Ht 5\' 7"  (1.702 m)   Wt 184 lb 0.6 oz (83.5 kg)   SpO2 97%   BMI 28.82 kg/m  Wt Readings from Last 3 Encounters:  09/17/20 184 lb 0.6 oz (83.5 kg)  05/06/20 208 lb (94.3 kg)  09/10/19 192 lb (87.1 kg)    Health Maintenance Due  Topic Date Due  . URINE MICROALBUMIN  12/05/2017  . FOOT EXAM  05/01/2020    There are no preventive care reminders to display for this patient.   Lab Results  Component Value Date   TSH 1.61 05/06/2020   Lab Results  Component Value Date   WBC 7.4 05/06/2020   HGB 13.2 05/06/2020   HCT 38.5 05/06/2020   MCV 97.5 05/06/2020   PLT 256 05/06/2020   Lab Results  Component Value Date   NA 137 05/06/2020   K 4.7 05/06/2020   CO2 28 05/06/2020   GLUCOSE 121 (H) 05/06/2020   BUN 23 05/06/2020   CREATININE 1.09 (H) 05/06/2020   BILITOT 1.2 05/06/2020   ALKPHOS 51 09/01/2015   AST 28 05/06/2020   ALT 17 05/06/2020   PROT 8.0 05/06/2020   ALBUMIN 3.4 (L) 09/01/2015   CALCIUM 9.4 05/06/2020   Lab Results  Component Value Date   CHOL 128 09/01/2015   Lab Results  Component Value Date   HDL 42 (L) 09/01/2015   Lab Results  Component Value Date   LDLCALC 61 09/01/2015   Lab Results  Component Value Date   TRIG 124 09/01/2015   Lab Results  Component Value Date   CHOLHDL 3.0 09/01/2015   Lab Results  Component Value Date   HGBA1C 5.3 05/06/2020       Assessment & Plan:   Problem List Items Addressed This Visit      Endocrine   Diabetes mellitus with neuropathy (HCC)   Relevant Orders   POCT glucose (manual entry) (Completed)    Other Visit Diagnoses    Diarrhea, unspecified type    -  Primary   Relevant Orders   Lipase    Hemorrhoids, unspecified hemorrhoid type       Relevant Medications   metoprolol tartrate (LOPRESSOR) 50 MG tablet   phenylephrine-shark liver oil-mineral oil-petrolatum (PREPARATION H) 0.25-14-74.9 % rectal ointment     POC blood sugars 200 today in the office. She has been eating crackers and jello, which could be bringing her blood  sugars up. This could also be related to infectious process that may be present.  She has orders for stool sample for testing for infectious sources of diarrhea and collection containers at home. Discussed the importance of returning these as soon as she is able to provide a sample so we can better evaluate what is going on to determine the best course of action.   No signs of dehydration present today. Her mucous membranes are moist and there is no tenting present on the skin. Recommend continued intake of water and beverages without sugar added. Suggest gatorade zero to help with replenishment of electrolytes.  Discussed continued use of the lomotil, but if this is not helpful at all she may stop this treatment.  Discussed BRAT and bland diet to help increase her caloric intake without aggravating her digestive system further. Recommend she try to eat yogurt with active cultures for gut health.  Patient provided with urine specimen cup and hat today- she was unable to provide a urine sample in the office. Instructions on proper collection provided and recommend that she return as soon as possible to evaluate for UTI.  Will obtain labs today in the office. These were ordered by her PCP on the 2/7 for home health to draw, but they were not obtained. Will get CBC, CMP, and Lipase today.   Hemorrhoid aggravation with frequent bowel movements. No active bleeding today. Will send prescription for preparation H rectal ointment in for BID use.  Monitor for bleeding or worsening symptoms.   Strongly encouraged patient to return samples as soon as possible for evaluation. I  am concerned for possible infectious etiology due to prolonged symptoms and elevated body temperature today.   Meds ordered this encounter  Medications  . phenylephrine-shark liver oil-mineral oil-petrolatum (PREPARATION H) 0.25-14-74.9 % rectal ointment    Sig: Place 1 application rectally 2 (two) times daily as needed for hemorrhoids.    Dispense:  28 g    Refill:  2   Will determine follow-up based on lab results.   Orma Render, NP

## 2020-09-20 DIAGNOSIS — R109 Unspecified abdominal pain: Secondary | ICD-10-CM | POA: Diagnosis not present

## 2020-09-20 DIAGNOSIS — R1110 Vomiting, unspecified: Secondary | ICD-10-CM | POA: Diagnosis not present

## 2020-09-20 DIAGNOSIS — N281 Cyst of kidney, acquired: Secondary | ICD-10-CM | POA: Diagnosis not present

## 2020-09-20 DIAGNOSIS — E860 Dehydration: Secondary | ICD-10-CM | POA: Diagnosis not present

## 2020-09-20 DIAGNOSIS — R112 Nausea with vomiting, unspecified: Secondary | ICD-10-CM | POA: Diagnosis not present

## 2020-09-20 DIAGNOSIS — N179 Acute kidney failure, unspecified: Secondary | ICD-10-CM | POA: Diagnosis not present

## 2020-09-20 DIAGNOSIS — G6181 Chronic inflammatory demyelinating polyneuritis: Secondary | ICD-10-CM | POA: Diagnosis not present

## 2020-09-20 DIAGNOSIS — R509 Fever, unspecified: Secondary | ICD-10-CM | POA: Diagnosis not present

## 2020-09-20 DIAGNOSIS — I10 Essential (primary) hypertension: Secondary | ICD-10-CM | POA: Diagnosis not present

## 2020-09-20 DIAGNOSIS — N838 Other noninflammatory disorders of ovary, fallopian tube and broad ligament: Secondary | ICD-10-CM | POA: Diagnosis not present

## 2020-09-20 DIAGNOSIS — R1111 Vomiting without nausea: Secondary | ICD-10-CM | POA: Diagnosis not present

## 2020-09-20 DIAGNOSIS — J9611 Chronic respiratory failure with hypoxia: Secondary | ICD-10-CM | POA: Diagnosis not present

## 2020-09-20 DIAGNOSIS — E1165 Type 2 diabetes mellitus with hyperglycemia: Secondary | ICD-10-CM | POA: Diagnosis not present

## 2020-09-20 DIAGNOSIS — I714 Abdominal aortic aneurysm, without rupture: Secondary | ICD-10-CM | POA: Diagnosis not present

## 2020-09-20 DIAGNOSIS — R197 Diarrhea, unspecified: Secondary | ICD-10-CM | POA: Diagnosis not present

## 2020-09-20 DIAGNOSIS — K529 Noninfective gastroenteritis and colitis, unspecified: Secondary | ICD-10-CM | POA: Diagnosis not present

## 2020-09-20 DIAGNOSIS — R1084 Generalized abdominal pain: Secondary | ICD-10-CM | POA: Diagnosis not present

## 2020-09-21 DIAGNOSIS — E785 Hyperlipidemia, unspecified: Secondary | ICD-10-CM | POA: Diagnosis present

## 2020-09-21 DIAGNOSIS — F32A Depression, unspecified: Secondary | ICD-10-CM | POA: Diagnosis not present

## 2020-09-21 DIAGNOSIS — K589 Irritable bowel syndrome without diarrhea: Secondary | ICD-10-CM | POA: Diagnosis present

## 2020-09-21 DIAGNOSIS — I959 Hypotension, unspecified: Secondary | ICD-10-CM | POA: Diagnosis not present

## 2020-09-21 DIAGNOSIS — R109 Unspecified abdominal pain: Secondary | ICD-10-CM | POA: Diagnosis not present

## 2020-09-21 DIAGNOSIS — K529 Noninfective gastroenteritis and colitis, unspecified: Secondary | ICD-10-CM | POA: Diagnosis present

## 2020-09-21 DIAGNOSIS — F419 Anxiety disorder, unspecified: Secondary | ICD-10-CM | POA: Diagnosis present

## 2020-09-21 DIAGNOSIS — E876 Hypokalemia: Secondary | ICD-10-CM | POA: Diagnosis present

## 2020-09-21 DIAGNOSIS — G6181 Chronic inflammatory demyelinating polyneuritis: Secondary | ICD-10-CM | POA: Diagnosis present

## 2020-09-21 DIAGNOSIS — Z794 Long term (current) use of insulin: Secondary | ICD-10-CM | POA: Diagnosis not present

## 2020-09-21 DIAGNOSIS — G4733 Obstructive sleep apnea (adult) (pediatric): Secondary | ICD-10-CM | POA: Diagnosis present

## 2020-09-21 DIAGNOSIS — Z9989 Dependence on other enabling machines and devices: Secondary | ICD-10-CM | POA: Diagnosis not present

## 2020-09-21 DIAGNOSIS — Z7984 Long term (current) use of oral hypoglycemic drugs: Secondary | ICD-10-CM | POA: Diagnosis not present

## 2020-09-21 DIAGNOSIS — E669 Obesity, unspecified: Secondary | ICD-10-CM | POA: Diagnosis present

## 2020-09-21 DIAGNOSIS — R197 Diarrhea, unspecified: Secondary | ICD-10-CM | POA: Diagnosis not present

## 2020-09-21 DIAGNOSIS — R1110 Vomiting, unspecified: Secondary | ICD-10-CM | POA: Diagnosis not present

## 2020-09-21 DIAGNOSIS — E039 Hypothyroidism, unspecified: Secondary | ICD-10-CM | POA: Diagnosis present

## 2020-09-21 DIAGNOSIS — N179 Acute kidney failure, unspecified: Secondary | ICD-10-CM | POA: Diagnosis present

## 2020-09-21 DIAGNOSIS — Z720 Tobacco use: Secondary | ICD-10-CM | POA: Diagnosis not present

## 2020-09-21 DIAGNOSIS — F17200 Nicotine dependence, unspecified, uncomplicated: Secondary | ICD-10-CM | POA: Diagnosis present

## 2020-09-21 DIAGNOSIS — J9611 Chronic respiratory failure with hypoxia: Secondary | ICD-10-CM | POA: Diagnosis present

## 2020-09-21 DIAGNOSIS — E860 Dehydration: Secondary | ICD-10-CM | POA: Diagnosis present

## 2020-09-21 DIAGNOSIS — I10 Essential (primary) hypertension: Secondary | ICD-10-CM | POA: Diagnosis present

## 2020-09-21 DIAGNOSIS — E1142 Type 2 diabetes mellitus with diabetic polyneuropathy: Secondary | ICD-10-CM | POA: Diagnosis present

## 2020-09-21 DIAGNOSIS — I252 Old myocardial infarction: Secondary | ICD-10-CM | POA: Diagnosis not present

## 2020-09-21 DIAGNOSIS — R112 Nausea with vomiting, unspecified: Secondary | ICD-10-CM | POA: Diagnosis not present

## 2020-09-21 DIAGNOSIS — J449 Chronic obstructive pulmonary disease, unspecified: Secondary | ICD-10-CM | POA: Diagnosis present

## 2020-09-21 DIAGNOSIS — Z6833 Body mass index (BMI) 33.0-33.9, adult: Secondary | ICD-10-CM | POA: Diagnosis not present

## 2020-09-21 DIAGNOSIS — R001 Bradycardia, unspecified: Secondary | ICD-10-CM | POA: Diagnosis not present

## 2020-09-21 DIAGNOSIS — E1165 Type 2 diabetes mellitus with hyperglycemia: Secondary | ICD-10-CM | POA: Diagnosis present

## 2020-09-21 MED ORDER — POTASSIUM CHLORIDE CRYS ER 20 MEQ PO TBCR: 20.0000 meq | EXTENDED_RELEASE_TABLET | Freq: Every day | ORAL | 3 refills | Status: DC

## 2020-09-21 NOTE — Addendum Note (Signed)
Addended by: [name] D on: 09/21/2020 07:35 AM   Modules accepted: Orders

## 2020-09-24 ENCOUNTER — Telehealth: Payer: Self-pay

## 2020-09-24 ENCOUNTER — Telehealth: Payer: Self-pay | Admitting: *Deleted

## 2020-09-24 NOTE — Telephone Encounter (Signed)
Pt left vm wanting to know if you'd send in Phenergan for her, she said she prefers that over the Zofran.

## 2020-09-24 NOTE — Telephone Encounter (Signed)
Transition Care Management Follow-up Telephone Call  Date of discharge and from where: 09/23/2020 from [facility]  How have you been since you were released from the hospital? Pt states that she is still feeling nauseous.   Any questions or concerns? No  Items Reviewed:  Did the pt receive and understand the discharge instructions provided? Yes   Medications obtained and verified? Yes   Other? No   Any new allergies since your discharge? No   Dietary orders reviewed? DM and Heart Healthy  Do you have support at home? Yes   Functional Questionnaire: (I = Independent and D = Dependent) ADLs: I  Bathing/Dressing- I  Meal Prep- I  Eating- I  Maintaining continence- I  Transferring/Ambulation- I , uses walker and wheel chair when needed.   Managing Meds- I   Follow up appointments reviewed:   PCP Hospital f/u appt confirmed? Yes  Scheduled to see [name], MD on  @ 3:00pm.  Are transportation arrangements needed? No  If their condition worsens, is the pt aware to call PCP or go to the Emergency Dept.? Yes Was the patient provided with contact information for the PCP's office or ED? Yes Was to pt encouraged to call back with questions or concerns? Yes

## 2020-09-26 DIAGNOSIS — E1165 Type 2 diabetes mellitus with hyperglycemia: Secondary | ICD-10-CM | POA: Diagnosis not present

## 2020-09-26 DIAGNOSIS — I10 Essential (primary) hypertension: Secondary | ICD-10-CM | POA: Diagnosis not present

## 2020-09-26 DIAGNOSIS — F32A Depression, unspecified: Secondary | ICD-10-CM | POA: Diagnosis not present

## 2020-09-26 DIAGNOSIS — Z7983 Long term (current) use of bisphosphonates: Secondary | ICD-10-CM | POA: Diagnosis not present

## 2020-09-26 DIAGNOSIS — Z7902 Long term (current) use of antithrombotics/antiplatelets: Secondary | ICD-10-CM | POA: Diagnosis not present

## 2020-09-26 DIAGNOSIS — N179 Acute kidney failure, unspecified: Secondary | ICD-10-CM | POA: Diagnosis not present

## 2020-09-26 DIAGNOSIS — F17210 Nicotine dependence, cigarettes, uncomplicated: Secondary | ICD-10-CM | POA: Diagnosis not present

## 2020-09-26 DIAGNOSIS — I714 Abdominal aortic aneurysm, without rupture: Secondary | ICD-10-CM | POA: Diagnosis not present

## 2020-09-26 DIAGNOSIS — E039 Hypothyroidism, unspecified: Secondary | ICD-10-CM | POA: Diagnosis not present

## 2020-09-26 DIAGNOSIS — K529 Noninfective gastroenteritis and colitis, unspecified: Secondary | ICD-10-CM | POA: Diagnosis not present

## 2020-09-26 DIAGNOSIS — Z9981 Dependence on supplemental oxygen: Secondary | ICD-10-CM | POA: Diagnosis not present

## 2020-09-26 DIAGNOSIS — E1136 Type 2 diabetes mellitus with diabetic cataract: Secondary | ICD-10-CM | POA: Diagnosis not present

## 2020-09-26 DIAGNOSIS — E785 Hyperlipidemia, unspecified: Secondary | ICD-10-CM | POA: Diagnosis not present

## 2020-09-26 DIAGNOSIS — G6181 Chronic inflammatory demyelinating polyneuritis: Secondary | ICD-10-CM | POA: Diagnosis not present

## 2020-09-26 DIAGNOSIS — Z9181 History of falling: Secondary | ICD-10-CM | POA: Diagnosis not present

## 2020-09-26 DIAGNOSIS — K589 Irritable bowel syndrome without diarrhea: Secondary | ICD-10-CM | POA: Diagnosis not present

## 2020-09-26 DIAGNOSIS — Z7984 Long term (current) use of oral hypoglycemic drugs: Secondary | ICD-10-CM | POA: Diagnosis not present

## 2020-09-26 DIAGNOSIS — J9611 Chronic respiratory failure with hypoxia: Secondary | ICD-10-CM | POA: Diagnosis not present

## 2020-09-26 DIAGNOSIS — G4733 Obstructive sleep apnea (adult) (pediatric): Secondary | ICD-10-CM | POA: Diagnosis not present

## 2020-09-27 ENCOUNTER — Other Ambulatory Visit: Payer: Self-pay | Admitting: Family Medicine

## 2020-09-27 MED ORDER — PROMETHAZINE HCL 25 MG PO TABS: 25.0000 mg | ORAL_TABLET | Freq: Three times a day (TID) | ORAL | 0 refills | Status: DC | PRN

## 2020-09-27 NOTE — Telephone Encounter (Signed)
Faythe Ghee, Phenergan sent to pharmacy.

## 2020-09-29 DIAGNOSIS — G6181 Chronic inflammatory demyelinating polyneuritis: Secondary | ICD-10-CM | POA: Diagnosis not present

## 2020-09-29 DIAGNOSIS — K529 Noninfective gastroenteritis and colitis, unspecified: Secondary | ICD-10-CM | POA: Diagnosis not present

## 2020-09-29 DIAGNOSIS — E1165 Type 2 diabetes mellitus with hyperglycemia: Secondary | ICD-10-CM | POA: Diagnosis not present

## 2020-09-29 DIAGNOSIS — I10 Essential (primary) hypertension: Secondary | ICD-10-CM | POA: Diagnosis not present

## 2020-09-29 DIAGNOSIS — N179 Acute kidney failure, unspecified: Secondary | ICD-10-CM | POA: Diagnosis not present

## 2020-09-29 DIAGNOSIS — J9611 Chronic respiratory failure with hypoxia: Secondary | ICD-10-CM | POA: Diagnosis not present

## 2020-10-01 ENCOUNTER — Other Ambulatory Visit: Payer: Self-pay

## 2020-10-01 ENCOUNTER — Ambulatory Visit (INDEPENDENT_AMBULATORY_CARE_PROVIDER_SITE_OTHER): Payer: Medicare Other | Admitting: Family Medicine

## 2020-10-01 VITALS — BP 80/59 | HR 57

## 2020-10-01 DIAGNOSIS — R1900 Intra-abdominal and pelvic swelling, mass and lump, unspecified site: Secondary | ICD-10-CM | POA: Diagnosis not present

## 2020-10-01 DIAGNOSIS — R197 Diarrhea, unspecified: Secondary | ICD-10-CM

## 2020-10-01 DIAGNOSIS — I712 Thoracic aortic aneurysm, without rupture, unspecified: Secondary | ICD-10-CM

## 2020-10-01 DIAGNOSIS — R1032 Left lower quadrant pain: Secondary | ICD-10-CM | POA: Diagnosis not present

## 2020-10-01 DIAGNOSIS — I723 Aneurysm of iliac artery: Secondary | ICD-10-CM

## 2020-10-01 DIAGNOSIS — R110 Nausea: Secondary | ICD-10-CM

## 2020-10-01 DIAGNOSIS — E861 Hypovolemia: Secondary | ICD-10-CM | POA: Diagnosis not present

## 2020-10-01 DIAGNOSIS — I9589 Other hypotension: Secondary | ICD-10-CM | POA: Diagnosis not present

## 2020-10-01 MED ORDER — LANSOPRAZOLE 30 MG PO CPDR: 30.0000 mg | DELAYED_RELEASE_CAPSULE | Freq: Every day | ORAL | 0 refills | Status: DC

## 2020-10-01 NOTE — Patient Instructions (Addendum)
Please hold the Metoprolol for now.  Hold the lasix for 2 days as well and then can restart one a day if feeling better. Have the PT or nurse check you BP next week and let me know how it is doing.

## 2020-10-01 NOTE — Progress Notes (Signed)
Established Patient Office Visit  Subjective:  Patient ID: [name], female    DOB: 1946/06/17  Age: 75 y.o. MRN: [id number]  CC:  Chief Complaint  Patient presents with  . Hospitalization Follow-up    HPI [name] presents for Hospital follow up.  See copy of abbreviated note below from hospitalization.  Since she has been home she has felt a little better she is no longer vomiting but still feels very nauseated especially when she first wakes up in the morning and then after she eats or drinks even if it is just water.  She is not currently on any type of antacid or reflux medication they did give her some Lomotil and Bentyl.  She was using the Zofran as well but felt like it really was not helpful and so had called and we sent in some Phenergan she.  She says it helps sometimes but does not consistently.  She says the stools are starting to get a little bit more consistency and are not quite as loose as they were.  She says she will still get a little bit of pressure in her gut after about her third bowel movement of the day but no significant cramping or pain.  Diabetes-she is been using 15 units of Lantus daily since she got home from the hospital she says her blood sugars have been running a little bit higher.  CT of the abdomen showed a stable 3.6 cm aneurysmal dilatation the distal thoracic aorta there is also a stable 4 cm infrarenal aortic aneurysm.  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  Diffuse calcification in the aorta and mesenteric and iliac arteries.  He also found a 2.6 x 1.9 cm cyst in the left adnexa.  Admit date: 09/20/2020 Discharge date: 09/23/2020  Hospital LOS: 2 days  Active Hospital Problems  Diagnosis Date Noted POA  . *Vomiting and diarrhea 09/21/2020 Yes  . AKI (acute kidney injury) (*) 09/21/2020 Yes  . Urinary retention 09/22/2020 Yes  . Acute hypokalemia  06/12/2020 Yes  . HTN (hypertension) 06/12/2020 Yes  . HLD (hyperlipidemia) 06/12/2020 Yes  . OSA on CPAP 28/31/5176 Not Applicable  . Tobacco abuse 06/12/2020 Yes  . Hypothyroidism 06/12/2020 Yes  . Depression 06/12/2020 Yes  . Chronic respiratory failure with hypoxia (*) 09/23/2020 Unknown  . CIDP (chronic inflammatory demyelinating polyneuropathy) (*) 09/22/2020 Yes     Indication for Admission:  Vomiting/diarrhea/gastroenteritis  History of Present Illness: 75 y.o. female with history of diabetes mellitus, hypertension, hyperlipidemia, irritable bowel syndrome, peripheral neuropathy, sleep apnea compliant with CPAP, presented to ED with complaint of nausea vomiting and diarrhea started 1 day earlier. In the ED K 2.9, Glc 280, Cr 1.07, bili 2.11, Na 135. CT A/P revealed unchanged lower thoracic and infrarenal abdominal aortic aneurysm with stable common iliac aneurysm and no evidence of obstruction/infection.   Hospital Course:    Dehydration  Patient placed on IVF and antiemetics. Encourage po intake and BMP Na 137, K 4.7, Cr 0.79, normal LFTs. Hypokalemia/hypomagnesemia  Improved with replacement. Magnesium 1.8 and K 4.7 on day of discharge Diarrhea suspect gastroenteritis Stool studies ordered but patient did not have any further diarrhea. She was not placed on abx. Bradycardia/hypotension  Patient had initial HR low 40s and BP 80/38. Lopressor held and bradycardia/hypotension resolved. Resume lopressor on day of discharge. DM2 w/ hyperglycemia and long term insulin use  Diabetic diet and resume home meds. Patient states she does not use glucotrol any longer OSA on CPAP  Continue CPAP qhs  Chronic hypoxic resp failure Patient continued oxygen which she states she has at home Urinary retention  Patient had some intermittent urinary retention which required I/O cath. This resolved prior to discharge. CIDP  PT eval ordered and recommended home health PT. There is some question  about whether patient should continue cellcept started by neurology. Notes indicate this should continue but patient believes she was told to stop. Have asked her to discuss w/ neurology and followup ASAP.     Past Medical History:  Diagnosis Date  . Arthritis   . Charcot-Marie-Tooth disease   . COPD (chronic obstructive pulmonary disease) ([place])   . DDD (degenerative disc disease)    Lumbar and lumbosacral  . Depression   . Diabetes mellitus    type 2  . Diabetic peripheral neuropathy ([place])   . Facet syndrome, lumbar   . Gastric ulcer    w/o hemorrhage  . Hyperlipidemia   . Hypertension   . Insomnia   . Lumbosacral root lesions, not elsewhere classified   . Neurogenic bladder   . Obesity   . Osteopenia   . Other symptoms referable to back   . Post-menopausal   . PUD (peptic ulcer disease)   . 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 Spinal stenosis, lumbar region, without neurogenic claudication   . Thoracic spondylosis without myelopathy   . Thyroid disease    hypo  . Tobacco dependence   . Unspecified hereditary and idiopathic peripheral neuropathy     Past Surgical History:  Procedure Laterality Date  . ANKLE RECONSTRUCTION     left ankle, plate and pins   . bilateral L3 medial branch block    . CHOLECYSTECTOMY    . fibroid tumor removal    . fluoroscopic L5 dorsal medial branch bloc    . RENAL ARTERY STENT  11/28/2010   left  . REPLACEMENT TOTAL KNEE      Family History  Problem Relation Age of Onset  . Stroke Mother   . Heart disease Father 38       heart attack  . Hypertension Father   . Cancer Father        lung  . Diabetes Paternal Aunt        insulin dependent    Social History   Socioeconomic History  . Marital status: Widowed    Spouse name: Not on file  . Number of children: Not on file  . Years of education: Not on file  . Highest education level: Not on file  Occupational History  . Not on file  Tobacco Use  .  Smoking status: Current Every Day Smoker    Packs/day: 0.75    Years: 49.00    Pack years: 36.75    Types: Cigarettes  . Smokeless tobacco: Never Used  . Tobacco comment: pt states she is going to try the E-cig  Substance and Sexual Activity  . Alcohol use: No    Alcohol/week: 0.0 standard drinks  . Drug use: No  . Sexual activity: Not on file  Other Topics Concern  . Not on file  Social History Narrative  . Not on file   Social Determinants of Health   Financial Resource Strain: Not on file  Food Insecurity: Not on file  Transportation Needs: Not on file  Physical Activity: Not on file  Stress: Not on file  Social Connections: Not on file  Intimate Partner Violence: Not on file    Outpatient Medications Prior to Visit  Medication Sig Dispense Refill  . albuterol (ACCUNEB) 1.25 MG/3ML nebulizer solution Take 3 mLs (1.25 mg total) by nebulization every 6 (six) hours as needed for wheezing or shortness of breath. 75 mL 12  . alendronate (FOSAMAX) 70 MG tablet TAKE 1 TABLET BY MOUTH EVERY 7 DAYS. TAKE IN THE MORNING WITH A FULL GLASS OF WATER ON AN EMPTY STOMACH. DO NOT TAKE OTHER FOOD OR DRINK OR LIE DOWN 12 tablet 1  . AMBULATORY NON FORMULARY MEDICATION Medication Name: Nebulizer machine with equipment.  Diagnosis COPD.  Patient is primarily homebound.  Is faxed to [facility]. She is an established patient. 1 Units 0  . aspirin EC 81 MG tablet Take 81 mg by mouth daily.    . blood glucose meter kit and supplies KIT Dispense based on patient and insurance preference. Use up to four times daily as directed Dx E11.40 1 each 0  . calcium carbonate (OS-CAL) 600 MG tablet Take by mouth.    . clopidogrel (PLAVIX) 75 MG tablet TAKE ONE TABLET BY MOUTH EVERY DAY 30 tablet 11  . dicyclomine (BENTYL) 20 MG tablet TAKE ONE TABLET BY MOUTH THREE TIMES DAILY AS NEEDED FOR SPASMS 90 tablet 2  . diphenoxylate-atropine (LOMOTIL) 2.5-0.025 MG tablet TAKE ONE OR TWO TABLETS BY MOUTH TWICE DAILY AS  NEEDED FOR DIARRHEA OR LOOSE STOOLS 60 tablet 5  . FLUoxetine (PROZAC) 40 MG capsule Take 2 capsules (80 mg total) by mouth daily. 180 capsule 2  . furosemide (LASIX) 40 MG tablet Take 1 or 2 tablets by mouth every morning 90 tablet 0  . gabapentin (NEURONTIN) 400 MG capsule Take 400 mg with breakfast, lunch and dinner. Take 800 mg before bed    . GAMUNEX-C 20 GM/200ML SOLN     . hydrocortisone (ANUSOL-HC) 2.5 % rectal cream Place rectally 2 (two) times daily.    . insulin glargine (LANTUS SOLOSTAR) 100 UNIT/ML Solostar Pen Inject 10-15 Units into the skin at bedtime. 15 mL 2  . Lancets MISC by Does not apply route.    . levalbuterol (XOPENEX) 0.63 MG/3ML nebulizer solution Take 3 mLs (0.63 mg total) by nebulization every 8 (eight) hours as needed for wheezing or shortness of breath. 60 mL 3  . levothyroxine (SYNTHROID) 125 MCG tablet Take 1 tablet (125 mcg total) by mouth daily. 30 tablet 2  . lidocaine (XYLOCAINE) 5 % ointment Apply 1 application topically daily. 2 hours before skin procedure. 35.44 g 11  . meclizine (ANTIVERT) 25 MG tablet Take 1 tablet (25 mg total) by mouth 3 (three) times daily as needed for dizziness. 30 tablet 6  . metFORMIN (GLUCOPHAGE) 500 MG tablet TAKE ONE TABLET BY MOUTH THREE TIMES DAILY 270 tablet 1  . Multiple Vitamins-Minerals (PRESERVISION AREDS 2) CAPS     . mycophenolate (CELLCEPT) 500 MG tablet Take by mouth.  5  . nitroGLYCERIN (NITROSTAT) 0.4 MG SL tablet Place 1 tablet (0.4 mg total) under the tongue every 5 (five) minutes as needed. 10 tablet 2  . phenylephrine-shark liver oil-mineral oil-petrolatum (PREPARATION H) 0.25-14-74.9 % rectal ointment Place 1 application rectally 2 (two) times daily as needed for hemorrhoids. 28 g 2  . potassium chloride SA (KLOR-CON) 20 MEQ tablet Take 1 tablet (20 mEq total) by mouth daily. 30 tablet 3  . PRODIGY NO CODING BLOOD GLUC test strip USE AS DIRECTED 100  each 0  . promethazine (PHENERGAN) 25 MG tablet Take 1 tablet  (25 mg total) by mouth every 8 (eight) hours as needed for nausea or vomiting. 20 tablet 0  . simvastatin (ZOCOR) 40 MG tablet TAKE ONE TABLET BY MOUTH EVERY DAY AT SIX IN THE EVENING 90 tablet 3  . TRELEGY ELLIPTA 100-62.5-25 MCG/INH AEPB Inhale 1 puff into the lungs every morning. 60 each 5  . TRUEPLUS PEN NEEDLES 32G X 4 MM MISC USE WHEN INJECTING INSULIN DAILY 100 each 6  . zolpidem (AMBIEN) 5 MG tablet Take 1 tablet (5 mg total) by mouth at bedtime. 90 tablet 0  . AMBULATORY NON FORMULARY MEDICATION Medication Name: Rollator Dx: 1 each 0  . AMBULATORY NON FORMULARY MEDICATION Medication Name: Patient needs home health aide.  Please fax order to [facility] home health who she is currently getting home health services through. 1 Units 0  . metoprolol tartrate (LOPRESSOR) 50 MG tablet Take 50 mg by mouth 2 (two) times daily.    . ondansetron (ZOFRAN ODT) 4 MG disintegrating tablet Take 1 tablet (4 mg total) by mouth every 8 (eight) hours as needed for nausea or vomiting. 20 tablet 0   No facility-administered medications prior to visit.    Allergies  Allergen Reactions  . Varenicline Nausea And Vomiting    ROS Review of Systems    Objective:    Physical Exam  BP (!) 80/59   Pulse (!) 57   SpO2 95%  Wt Readings from Last 3 Encounters:  09/17/20 184 lb 0.6 oz (83.5 kg)  05/06/20 208 lb (94.3 kg)  09/10/19 192 lb (87.1 kg)     Health Maintenance Due  Topic Date Due  . URINE MICROALBUMIN  12/05/2017  . FOOT EXAM  05/01/2020    There are no preventive care reminders to display for this patient.  Lab Results  Component Value Date   TSH 1.61 05/06/2020   Lab Results  Component Value Date   WBC 8.4 10/01/2020   HGB 11.5 (L) 10/01/2020   HCT 35.1 10/01/2020   MCV 86.7 10/01/2020   PLT 234 10/01/2020   Lab Results  Component Value Date   NA 136 10/01/2020   K 3.1 (L) 10/01/2020   CO2 27 10/01/2020   GLUCOSE 210 (H) 10/01/2020   BUN 17 10/01/2020   CREATININE 1.17 (H)  10/01/2020   BILITOT 1.2 09/17/2020   ALKPHOS 51 09/01/2015   AST 10 09/17/2020   ALT 4 (L) 09/17/2020   PROT 8.8 (H) 09/17/2020   ALBUMIN 3.4 (L) 09/01/2015   CALCIUM 8.3 (L) 10/01/2020   Lab Results  Component Value Date   CHOL 128 09/01/2015   Lab Results  Component Value Date   HDL 42 (L) 09/01/2015   Lab Results  Component Value Date   LDLCALC 61 09/01/2015   Lab Results  Component Value Date   TRIG 124 09/01/2015   Lab Results  Component Value Date   CHOLHDL 3.0 09/01/2015   Lab Results  Component Value Date   HGBA1C 5.3 05/06/2020      Assessment & Plan:   Problem List Items Addressed This Visit      Cardiovascular and Mediastinum   Thoracic aortic aneurysm without rupture (HCC)    Seen on CT 09/2020: 3.6 cm.  Will need to follow and recheck again in 2-3 years since distal.        ANEURYSM, ILIAC ARTERY    Bilateral. Will need to be monitored. Consider rpeat imaging  in 1 year since stable thus far.         Other Visit Diagnoses    Nausea    -  Primary   Relevant Medications   lansoprazole (PREVACID) 30 MG capsule   Other Relevant Orders   BASIC METABOLIC PANEL WITH GFR (Completed)   Magnesium (Completed)   CBC (Completed)   Diarrhea, unspecified type       Relevant Medications   lansoprazole (PREVACID) 30 MG capsule   Other Relevant Orders   BASIC METABOLIC PANEL WITH GFR (Completed)   Magnesium (Completed)   CBC (Completed)   Left adnexal mass       Relevant Medications   lansoprazole (PREVACID) 30 MG capsule   Other Relevant Orders   BASIC METABOLIC PANEL WITH GFR (Completed)   Magnesium (Completed)   CBC (Completed)   CA 125   LLQ pain       Relevant Orders   CA 125   Hypotension due to hypovolemia          Nausea - trial of PPI for 14 days to see if helping. D/C zofran. She feels the phenergan works better.   Pelvic pain and LLQ pain - will check CA-125 and consider pelvic US for further workup.  2.6 cm adenxal cyst - eval  with [place] and CA-125.   Hypotension - likely secondary to diarrhea and mild dehydration. Please hold the Metoprolol for now.  Hold the lasix for 2 days as well and then can restart one a day if feeling better. Have the PT or nurse check you BP next week and let me know how it is doing.    Meds ordered this encounter  Medications  . lansoprazole (PREVACID) 30 MG capsule    Sig: Take 1 capsule (30 mg total) by mouth daily at 12 noon.    Dispense:  90 capsule    Refill:  0    Follow-up: Return in about 3 months (around 12/29/2020).   I spent 45 minutes on the day of the encounter to include pre-visit record review, face-to-face time with the patient and post visit ordering of test.   [name], MD

## 2020-10-03 ENCOUNTER — Encounter: Payer: Self-pay | Admitting: Family Medicine

## 2020-10-03 DIAGNOSIS — I712 Thoracic aortic aneurysm, without rupture, unspecified: Secondary | ICD-10-CM | POA: Insufficient documentation

## 2020-10-03 NOTE — Assessment & Plan Note (Signed)
Seen on CT 09/2020: 3.6 cm.  Will need to follow and recheck again in 2-3 years since distal.

## 2020-10-03 NOTE — Assessment & Plan Note (Addendum)
Bilateral. Will need to be monitored. Consider rpeat imaging in 1 year since stable thus far.

## 2020-10-04 LAB — CBC
HCT: 35.1 % (ref 35.0–45.0)
Hemoglobin: 11.5 g/dL — ABNORMAL LOW (ref 11.7–15.5)
MCH: 28.4 pg (ref 27.0–33.0)
MCHC: 32.8 g/dL (ref 32.0–36.0)
MCV: 86.7 fL (ref 80.0–100.0)
MPV: 12.4 fL (ref 7.5–12.5)
Platelets: 234 10*3/uL (ref 140–400)
RBC: 4.05 10*6/uL (ref 3.80–5.10)
RDW: 14.5 % (ref 11.0–15.0)
WBC: 8.4 10*3/uL (ref 3.8–10.8)

## 2020-10-04 LAB — BASIC METABOLIC PANEL WITHOUT GFR
BUN/Creatinine Ratio: 15 (calc) (ref 6–22)
BUN: 17 mg/dL (ref 7–25)
CO2: 27 mmol/L (ref 20–32)
Calcium: 8.3 mg/dL — ABNORMAL LOW (ref 8.6–10.4)
Chloride: 97 mmol/L — ABNORMAL LOW (ref 98–110)
Creat: 1.17 mg/dL — ABNORMAL HIGH (ref 0.60–0.93)
GFR, Est African American: 52 mL/min/{1.73_m2} — ABNORMAL LOW
GFR, Est Non African American: 45 mL/min/{1.73_m2} — ABNORMAL LOW
Glucose, Bld: 210 mg/dL — ABNORMAL HIGH (ref 65–99)
Potassium: 3.1 mmol/L — ABNORMAL LOW (ref 3.5–5.3)
Sodium: 136 mmol/L (ref 135–146)

## 2020-10-04 LAB — CA 125: CA 125: 19 U/mL (ref <35)

## 2020-10-04 LAB — MAGNESIUM: Magnesium: 1.3 mg/dL — ABNORMAL LOW (ref 1.5–2.5)

## 2020-10-05 DIAGNOSIS — J9611 Chronic respiratory failure with hypoxia: Secondary | ICD-10-CM | POA: Diagnosis not present

## 2020-10-05 DIAGNOSIS — E1165 Type 2 diabetes mellitus with hyperglycemia: Secondary | ICD-10-CM | POA: Diagnosis not present

## 2020-10-05 DIAGNOSIS — K529 Noninfective gastroenteritis and colitis, unspecified: Secondary | ICD-10-CM | POA: Diagnosis not present

## 2020-10-05 DIAGNOSIS — G6181 Chronic inflammatory demyelinating polyneuritis: Secondary | ICD-10-CM | POA: Diagnosis not present

## 2020-10-05 DIAGNOSIS — I10 Essential (primary) hypertension: Secondary | ICD-10-CM | POA: Diagnosis not present

## 2020-10-05 DIAGNOSIS — N179 Acute kidney failure, unspecified: Secondary | ICD-10-CM | POA: Diagnosis not present

## 2020-10-05 NOTE — Addendum Note (Signed)
Addended by: [name] D on: 10/05/2020 07:57 AM   Modules accepted: Orders

## 2020-10-07 ENCOUNTER — Ambulatory Visit (INDEPENDENT_AMBULATORY_CARE_PROVIDER_SITE_OTHER): Payer: Medicare Other

## 2020-10-07 ENCOUNTER — Other Ambulatory Visit: Payer: Self-pay

## 2020-10-07 DIAGNOSIS — G6181 Chronic inflammatory demyelinating polyneuritis: Secondary | ICD-10-CM | POA: Diagnosis not present

## 2020-10-07 DIAGNOSIS — I10 Essential (primary) hypertension: Secondary | ICD-10-CM | POA: Diagnosis not present

## 2020-10-07 DIAGNOSIS — J9611 Chronic respiratory failure with hypoxia: Secondary | ICD-10-CM | POA: Diagnosis not present

## 2020-10-07 DIAGNOSIS — R1032 Left lower quadrant pain: Secondary | ICD-10-CM | POA: Diagnosis not present

## 2020-10-07 DIAGNOSIS — Z780 Asymptomatic menopausal state: Secondary | ICD-10-CM

## 2020-10-07 DIAGNOSIS — N179 Acute kidney failure, unspecified: Secondary | ICD-10-CM | POA: Diagnosis not present

## 2020-10-07 DIAGNOSIS — N838 Other noninflammatory disorders of ovary, fallopian tube and broad ligament: Secondary | ICD-10-CM | POA: Diagnosis not present

## 2020-10-07 DIAGNOSIS — E1165 Type 2 diabetes mellitus with hyperglycemia: Secondary | ICD-10-CM | POA: Diagnosis not present

## 2020-10-07 DIAGNOSIS — R9389 Abnormal findings on diagnostic imaging of other specified body structures: Secondary | ICD-10-CM

## 2020-10-07 DIAGNOSIS — K529 Noninfective gastroenteritis and colitis, unspecified: Secondary | ICD-10-CM | POA: Diagnosis not present

## 2020-10-07 DIAGNOSIS — R1900 Intra-abdominal and pelvic swelling, mass and lump, unspecified site: Secondary | ICD-10-CM | POA: Diagnosis not present

## 2020-10-07 DIAGNOSIS — N83202 Unspecified ovarian cyst, left side: Secondary | ICD-10-CM | POA: Diagnosis not present

## 2020-10-08 DIAGNOSIS — J9611 Chronic respiratory failure with hypoxia: Secondary | ICD-10-CM | POA: Diagnosis not present

## 2020-10-08 DIAGNOSIS — N179 Acute kidney failure, unspecified: Secondary | ICD-10-CM | POA: Diagnosis not present

## 2020-10-08 DIAGNOSIS — G6181 Chronic inflammatory demyelinating polyneuritis: Secondary | ICD-10-CM | POA: Diagnosis not present

## 2020-10-08 DIAGNOSIS — K529 Noninfective gastroenteritis and colitis, unspecified: Secondary | ICD-10-CM | POA: Diagnosis not present

## 2020-10-08 DIAGNOSIS — I10 Essential (primary) hypertension: Secondary | ICD-10-CM | POA: Diagnosis not present

## 2020-10-08 DIAGNOSIS — E1165 Type 2 diabetes mellitus with hyperglycemia: Secondary | ICD-10-CM | POA: Diagnosis not present

## 2020-10-11 ENCOUNTER — Other Ambulatory Visit: Payer: Self-pay | Admitting: Family Medicine

## 2020-10-11 DIAGNOSIS — K529 Noninfective gastroenteritis and colitis, unspecified: Secondary | ICD-10-CM | POA: Diagnosis not present

## 2020-10-11 DIAGNOSIS — G6181 Chronic inflammatory demyelinating polyneuritis: Secondary | ICD-10-CM | POA: Diagnosis not present

## 2020-10-11 DIAGNOSIS — N179 Acute kidney failure, unspecified: Secondary | ICD-10-CM | POA: Diagnosis not present

## 2020-10-11 DIAGNOSIS — I10 Essential (primary) hypertension: Secondary | ICD-10-CM | POA: Diagnosis not present

## 2020-10-11 DIAGNOSIS — J9611 Chronic respiratory failure with hypoxia: Secondary | ICD-10-CM | POA: Diagnosis not present

## 2020-10-11 DIAGNOSIS — E1165 Type 2 diabetes mellitus with hyperglycemia: Secondary | ICD-10-CM | POA: Diagnosis not present

## 2020-10-12 ENCOUNTER — Telehealth: Payer: Self-pay

## 2020-10-12 NOTE — Telephone Encounter (Signed)
[name] called and left a message wanting to know if there is another medication that could help with the nausea. Please advise.

## 2020-10-13 DIAGNOSIS — G6181 Chronic inflammatory demyelinating polyneuritis: Secondary | ICD-10-CM | POA: Diagnosis not present

## 2020-10-13 DIAGNOSIS — K529 Noninfective gastroenteritis and colitis, unspecified: Secondary | ICD-10-CM | POA: Diagnosis not present

## 2020-10-13 DIAGNOSIS — E1165 Type 2 diabetes mellitus with hyperglycemia: Secondary | ICD-10-CM | POA: Diagnosis not present

## 2020-10-13 DIAGNOSIS — I10 Essential (primary) hypertension: Secondary | ICD-10-CM | POA: Diagnosis not present

## 2020-10-13 DIAGNOSIS — J9611 Chronic respiratory failure with hypoxia: Secondary | ICD-10-CM | POA: Diagnosis not present

## 2020-10-13 DIAGNOSIS — N179 Acute kidney failure, unspecified: Secondary | ICD-10-CM | POA: Diagnosis not present

## 2020-10-13 NOTE — Telephone Encounter (Signed)
I thought she said the Zofran wasn't working so I called in phenergan. Those are really the main two options.

## 2020-10-14 DIAGNOSIS — K529 Noninfective gastroenteritis and colitis, unspecified: Secondary | ICD-10-CM | POA: Diagnosis not present

## 2020-10-14 DIAGNOSIS — E1165 Type 2 diabetes mellitus with hyperglycemia: Secondary | ICD-10-CM | POA: Diagnosis not present

## 2020-10-14 DIAGNOSIS — N179 Acute kidney failure, unspecified: Secondary | ICD-10-CM | POA: Diagnosis not present

## 2020-10-14 DIAGNOSIS — J9611 Chronic respiratory failure with hypoxia: Secondary | ICD-10-CM | POA: Diagnosis not present

## 2020-10-14 DIAGNOSIS — I10 Essential (primary) hypertension: Secondary | ICD-10-CM | POA: Diagnosis not present

## 2020-10-14 DIAGNOSIS — G6181 Chronic inflammatory demyelinating polyneuritis: Secondary | ICD-10-CM | POA: Diagnosis not present

## 2020-10-14 NOTE — Telephone Encounter (Signed)
She states it is much better today. She will call if it comes back.

## 2020-10-15 ENCOUNTER — Other Ambulatory Visit: Payer: Self-pay | Admitting: Family Medicine

## 2020-10-16 DIAGNOSIS — K529 Noninfective gastroenteritis and colitis, unspecified: Secondary | ICD-10-CM | POA: Diagnosis not present

## 2020-10-16 DIAGNOSIS — G6181 Chronic inflammatory demyelinating polyneuritis: Secondary | ICD-10-CM | POA: Diagnosis not present

## 2020-10-16 DIAGNOSIS — J9611 Chronic respiratory failure with hypoxia: Secondary | ICD-10-CM | POA: Diagnosis not present

## 2020-10-16 DIAGNOSIS — E1165 Type 2 diabetes mellitus with hyperglycemia: Secondary | ICD-10-CM | POA: Diagnosis not present

## 2020-10-16 DIAGNOSIS — I10 Essential (primary) hypertension: Secondary | ICD-10-CM | POA: Diagnosis not present

## 2020-10-16 DIAGNOSIS — N179 Acute kidney failure, unspecified: Secondary | ICD-10-CM | POA: Diagnosis not present

## 2020-10-19 DIAGNOSIS — I10 Essential (primary) hypertension: Secondary | ICD-10-CM | POA: Diagnosis not present

## 2020-10-19 DIAGNOSIS — N179 Acute kidney failure, unspecified: Secondary | ICD-10-CM | POA: Diagnosis not present

## 2020-10-19 DIAGNOSIS — E1165 Type 2 diabetes mellitus with hyperglycemia: Secondary | ICD-10-CM | POA: Diagnosis not present

## 2020-10-19 DIAGNOSIS — G6181 Chronic inflammatory demyelinating polyneuritis: Secondary | ICD-10-CM | POA: Diagnosis not present

## 2020-10-19 DIAGNOSIS — K529 Noninfective gastroenteritis and colitis, unspecified: Secondary | ICD-10-CM | POA: Diagnosis not present

## 2020-10-19 DIAGNOSIS — J9611 Chronic respiratory failure with hypoxia: Secondary | ICD-10-CM | POA: Diagnosis not present

## 2020-10-20 DIAGNOSIS — G6181 Chronic inflammatory demyelinating polyneuritis: Secondary | ICD-10-CM | POA: Diagnosis not present

## 2020-10-20 DIAGNOSIS — N179 Acute kidney failure, unspecified: Secondary | ICD-10-CM | POA: Diagnosis not present

## 2020-10-20 DIAGNOSIS — J9611 Chronic respiratory failure with hypoxia: Secondary | ICD-10-CM | POA: Diagnosis not present

## 2020-10-20 DIAGNOSIS — I10 Essential (primary) hypertension: Secondary | ICD-10-CM | POA: Diagnosis not present

## 2020-10-20 DIAGNOSIS — E1165 Type 2 diabetes mellitus with hyperglycemia: Secondary | ICD-10-CM | POA: Diagnosis not present

## 2020-10-20 DIAGNOSIS — K529 Noninfective gastroenteritis and colitis, unspecified: Secondary | ICD-10-CM | POA: Diagnosis not present

## 2020-10-21 DIAGNOSIS — E1165 Type 2 diabetes mellitus with hyperglycemia: Secondary | ICD-10-CM | POA: Diagnosis not present

## 2020-10-21 DIAGNOSIS — N179 Acute kidney failure, unspecified: Secondary | ICD-10-CM | POA: Diagnosis not present

## 2020-10-21 DIAGNOSIS — J9611 Chronic respiratory failure with hypoxia: Secondary | ICD-10-CM | POA: Diagnosis not present

## 2020-10-21 DIAGNOSIS — G6181 Chronic inflammatory demyelinating polyneuritis: Secondary | ICD-10-CM | POA: Diagnosis not present

## 2020-10-21 DIAGNOSIS — K529 Noninfective gastroenteritis and colitis, unspecified: Secondary | ICD-10-CM | POA: Diagnosis not present

## 2020-10-21 DIAGNOSIS — I10 Essential (primary) hypertension: Secondary | ICD-10-CM | POA: Diagnosis not present

## 2020-10-22 ENCOUNTER — Ambulatory Visit (INDEPENDENT_AMBULATORY_CARE_PROVIDER_SITE_OTHER): Payer: Medicare Other | Admitting: Family Medicine

## 2020-10-22 VITALS — BP 86/53 | HR 60 | Temp 99.1°F | Resp 18 | Ht 67.0 in | Wt 184.0 lb

## 2020-10-22 DIAGNOSIS — Z0000 Encounter for general adult medical examination without abnormal findings: Secondary | ICD-10-CM

## 2020-10-22 NOTE — Patient Instructions (Addendum)
[place] Maintenance Summary and Written Plan of Care  [name] ,  Thank you for allowing me to perform your Medicare Annual Wellness Visit and for your ongoing commitment to your health.   Health Maintenance & Immunization History Health Maintenance  Topic Date Due  . COVID-19 Vaccine (2 - Booster for Janssen series) 11/07/2020 (Originally 12/26/2019)  . URINE MICROALBUMIN  11/22/2020 (Originally 12/05/2017)  . FOOT EXAM  10/22/2021 (Originally 05/01/2020)  . HEMOGLOBIN A1C  11/03/2020  . OPHTHALMOLOGY EXAM  03/10/2021  . TETANUS/TDAP  10/05/2021  . INFLUENZA VACCINE  Completed  . DEXA SCAN  Completed  . Hepatitis C Screening  Completed  . PNA vac Low Risk Adult  Completed  . HPV VACCINES  Aged Out   Immunization History  Administered Date(s) Administered  . Fluad Quad(high Dose 65+) 05/02/2019  . H1N1 06/19/2008  . Influenza Split 05/06/2012  . Influenza Whole 05/28/2006, 06/13/2007, 05/28/2008, 07/05/2009, 05/06/2010, 04/24/2011  . Influenza, High Dose Seasonal PF 05/09/2018, 05/02/2019  . Influenza,inj,Quad PF,6+ Mos 05/07/2013, 03/27/2014, 05/03/2015, 04/03/2016  . Influenza-Unspecified 05/06/2020  . Janssen (J&J) SARS-COV-2 Vaccination 10/31/2019  . Pneumococcal Conjugate-13 04/27/2014  . Pneumococcal Polysaccharide-23 06/07/2001, 08/15/2006, 01/09/2012  . Tdap 10/06/2011    These are the patient goals that we discussed: Goals Addressed              This Visit's Progress   .  Patient Stated (pt-stated)        10/22/2020 AWV Goal: Improved Nutrition/Diet  . Patient will verbalize understanding that diet plays an important role in overall health and that a poor diet is a risk factor for many chronic medical conditions.  . Over the next year, patient will improve self management of their diet by incorporating more water throughout the day. . Patient will utilize available community resources to help with food acquisition if needed (ex:  food pantries, [address], etc) . Patient will work with nutrition specialist if a referral was made         This is a list of Health Maintenance Items that are overdue or due now: Foot exam, Bone density and urine microalbumin, shingrix  Orders/Referrals Placed Today: No orders of the defined types were placed in this encounter.  (Contact our referral department at [phone] if you have not spoken with someone about your referral appointment within the next 5 days)    Follow-up Plan . Follow-up with Hali Marry, MD as planned . At your next in office visit; we will do hemoglobin a1c; foot exam and urine microalbumin. . Medicare wellness in one year.    Bone Density Test A bone density test uses a type of X-ray to measure the amount of calcium and other minerals in a person's bones. It can measure bone density in the hip and the spine. The test is similar to having a regular X-ray. This test may also be called:  Bone densitometry.  Bone mineral density test.  Dual-energy X-ray absorptiometry (DEXA). You may have this test to:  Diagnose a condition that causes weak or thin bones (osteoporosis).  Screen you for osteoporosis.  Predict your risk for a broken bone (fracture).  Determine how well your osteoporosis treatment is working. Tell a health care provider about:  Any allergies you have.  All medicines you are taking, including vitamins, herbs, eye drops, creams, and over-the-counter medicines.  Any problems you or family members have had with anesthetic medicines.  Any blood disorders you have.  Any surgeries  you have had.  Any medical conditions you have.  Whether you are pregnant or may be pregnant.  Any medical tests you have had within the past 14 days that used contrast material. What are the risks? Generally, this is a safe test. However, it does expose you to a small amount of radiation, which can slightly increase your cancer risk. What  happens before the test?  Do not take any calcium supplements within the 24 hours before your test.  You will need to remove all metal jewelry, eyeglasses, removable dental appliances, and any other metal objects on your body. What happens during the test?  You will lie down on an exam table. There will be an X-ray generator below you and an imaging device above you.  Other devices, such as boxes or braces, may be used to position your body properly for the scan.  The machine will slowly scan your body. You will need to keep very still while the machine does the scan.  The images will show up on a screen in the room. Images will be examined by a specialist after your test is finished. The procedure may vary among health care providers and hospitals.   What can I expect after the test? It is up to you to get the results of your test. Ask your health care provider, or the department that is doing the test, when your results will be ready. Summary  A bone density test is an imaging test that uses a type of X-ray to measure the amount of calcium and other minerals in your bones.  The test may be used to diagnose or screen you for a condition that causes weak or thin bones (osteoporosis), predict your risk for a broken bone (fracture), or determine how well your osteoporosis treatment is working.  Do not take any calcium supplements within 24 hours before your test.  Ask your health care provider, or the department that is doing the test, when your results will be ready. This information is not intended to replace advice given to you by your health care provider. Make sure you discuss any questions you have with your health care provider. Document Revised: 01/08/2020 Document Reviewed: 01/08/2020 Elsevier Patient Education  [place] Maintenance, Female Adopting a healthy lifestyle and getting preventive care are important in promoting health and wellness. Ask your  health care provider about:  The right schedule for you to have regular tests and exams.  Things you can do on your own to prevent diseases and keep yourself healthy. What should I know about diet, weight, and exercise? Eat a healthy diet  Eat a diet that includes plenty of vegetables, fruits, low-fat dairy products, and lean protein.  Do not eat a lot of foods that are high in solid fats, added sugars, or sodium.   Maintain a healthy weight Body mass index (BMI) is used to identify weight problems. It estimates body fat based on height and weight. Your health care provider can help determine your BMI and help you achieve or maintain a healthy weight. Get regular exercise Get regular exercise. This is one of the most important things you can do for your health. Most adults should:  Exercise for at least 150 minutes each week. The exercise should increase your heart rate and make you sweat (moderate-intensity exercise).  Do strengthening exercises at least twice a week. This is in addition to the moderate-intensity exercise.  Spend less time sitting. Even light physical  activity can be beneficial. Watch cholesterol and blood lipids Have your blood tested for lipids and cholesterol at 76 years of age, then have this test every 5 years. Have your cholesterol levels checked more often if:  Your lipid or cholesterol levels are high.  You are older than 76 years of age.  You are at high risk for heart disease. What should I know about cancer screening? Depending on your health history and family history, you may need to have cancer screening at various ages. This may include screening for:  Breast cancer.  Cervical cancer.  Colorectal cancer.  Skin cancer.  Lung cancer. What should I know about heart disease, diabetes, and high blood pressure? Blood pressure and heart disease  High blood pressure causes heart disease and increases the risk of stroke. This is more likely to  develop in people who have high blood pressure readings, are of African descent, or are overweight.  Have your blood pressure checked: ? Every 3-5 years if you are 54-64 years of age. ? Every year if you are 30 years old or older. Diabetes Have regular diabetes screenings. This checks your fasting blood sugar level. Have the screening done:  Once every three years after age 24 if you are at a normal weight and have a low risk for diabetes.  More often and at a younger age if you are overweight or have a high risk for diabetes. What should I know about preventing infection? Hepatitis B If you have a higher risk for hepatitis B, you should be screened for this virus. Talk with your health care provider to find out if you are at risk for hepatitis B infection. Hepatitis C Testing is recommended for:  Everyone born from [birth year] through 1966.  Anyone with known risk factors for hepatitis C. Sexually transmitted infections (STIs)  Get screened for STIs, including gonorrhea and chlamydia, if: ? You are sexually active and are younger than 76 years of age. ? You are older than 76 years of age and your health care provider tells you that you are at risk for this type of infection. ? Your sexual activity has changed since you were last screened, and you are at increased risk for chlamydia or gonorrhea. Ask your health care provider if you are at risk.  Ask your health care provider about whether you are at high risk for HIV. Your health care provider may recommend a prescription medicine to help prevent HIV infection. If you choose to take medicine to prevent HIV, you should first get tested for HIV. You should then be tested every 3 months for as long as you are taking the medicine. Pregnancy  If you are about to stop having your period (premenopausal) and you may become pregnant, seek counseling before you get pregnant.  Take 400 to 800 micrograms (mcg) of folic acid every day if you become  pregnant.  Ask for birth control (contraception) if you want to prevent pregnancy. Osteoporosis and menopause Osteoporosis is a disease in which the bones lose minerals and strength with aging. This can result in bone fractures. If you are 42 years old or older, or if you are at risk for osteoporosis and fractures, ask your health care provider if you should:  Be screened for bone loss.  Take a calcium or vitamin D supplement to lower your risk of fractures.  Be given hormone replacement therapy (HRT) to treat symptoms of menopause. Follow these instructions at home: Lifestyle  Do not use any  products that contain nicotine or tobacco, such as cigarettes, e-cigarettes, and chewing tobacco. If you need help quitting, ask your health care provider.  Do not use street drugs.  Do not share needles.  Ask your health care provider for help if you need support or information about quitting drugs. Alcohol use  Do not drink alcohol if: ? Your health care provider tells you not to drink. ? You are pregnant, may be pregnant, or are planning to become pregnant.  If you drink alcohol: ? Limit how much you use to 0-1 drink a day. ? Limit intake if you are breastfeeding.  Be aware of how much alcohol is in your drink. In the [place] one drink equals one 12 oz bottle of beer (355 mL), one 5 oz glass of wine (148 mL), or one 1 oz glass of hard liquor (44 mL). General instructions  Schedule regular health, dental, and eye exams.  Stay current with your vaccines.  Tell your health care provider if: ? You often feel depressed. ? You have ever been abused or do not feel safe at home. Summary  Adopting a healthy lifestyle and getting preventive care are important in promoting health and wellness.  Follow your health care provider's instructions about healthy diet, exercising, and getting tested or screened for diseases.  Follow your health care provider's instructions on monitoring your  cholesterol and blood pressure. This information is not intended to replace advice given to you by your health care provider. Make sure you discuss any questions you have with your health care provider. Document Revised: 07/17/2018 Document Reviewed: 07/17/2018 Elsevier Patient Education  2021 [place].   Steps to Quit Smoking Smoking tobacco is the leading cause of preventable death. It can affect almost every organ in the body. Smoking puts you and those around you at risk for developing many serious chronic diseases. Quitting smoking can be difficult, but it is one of the best things that you can do for your health. It is never too late to quit. How do I get ready to quit? When you decide to quit smoking, create a plan to help you succeed. Before you quit:  Pick a date to quit. Set a date within the next 2 weeks to give you time to prepare.  Write down the reasons why you are quitting. Keep this list in places where you will see it often.  Tell your family, friends, and co-workers that you are quitting. Support from your loved ones can make quitting easier.  Talk with your health care provider about your options for quitting smoking.  Find out what treatment options are covered by your health insurance.  Identify people, places, things, and activities that make you want to smoke (triggers). Avoid them. What first steps can I take to quit smoking?  Throw away all cigarettes at home, at work, and in your car.  Throw away smoking accessories, such as Scientist, research (medical).  Clean your car. Make sure to empty the ashtray.  Clean your home, including curtains and carpets. What strategies can I use to quit smoking? Talk with your health care provider about combining strategies, such as taking medicines while you are also receiving in-person counseling. Using these two strategies together makes you more likely to succeed in quitting than if you used either strategy on its own.  If  you are pregnant or breastfeeding, talk with your health care provider about finding counseling or other support strategies to quit smoking. Do not take medicine to  help you quit smoking unless your health care provider tells you to do so. To quit smoking: Quit right away  Quit smoking completely, instead of gradually reducing how much you smoke over a period of time. Research shows that stopping smoking right away is more successful than gradually quitting.  Attend in-person counseling to help you build problem-solving skills. You are more likely to succeed in quitting if you attend counseling sessions regularly. Even short sessions of 10 minutes can be effective. Take medicine You may take medicines to help you quit smoking. Some medicines require a prescription and some you can purchase over-the-counter. Medicines may have nicotine in them to replace the nicotine in cigarettes. Medicines may:  Help to stop cravings.  Help to relieve withdrawal symptoms. Your health care provider may recommend:  Nicotine patches, gum, or lozenges.  Nicotine inhalers or sprays.  Non-nicotine medicine that is taken by mouth. Find resources Find resources and support systems that can help you to quit smoking and remain smoke-free after you quit. These resources are most helpful when you use them often. They include:  Online chats with a Social worker.  Telephone quitlines.  Printed Furniture conservator/restorer.  Support groups or group counseling.  Text messaging programs.  Mobile phone apps or applications. Use apps that can help you stick to your quit plan by providing reminders, tips, and encouragement. There are many free apps for mobile devices as well as websites. Examples include Quit Guide from the [employer] and smokefree.gov   What things can I do to make it easier to quit?  Reach out to your family and friends for support and encouragement. Call telephone quitlines (1-800-QUIT-NOW), reach out to support  groups, or work with a counselor for support.  Ask people who smoke to avoid smoking around you.  Avoid places that trigger you to smoke, such as bars, parties, or smoke-break areas at work.  Spend time with people who do not smoke.  Lessen the stress in your life. Stress can be a smoking trigger for some people. To lessen stress, try: ? Exercising regularly. ? Doing deep-breathing exercises. ? Doing yoga. ? Meditating. ? Performing a body scan. This involves closing your eyes, scanning your body from head to toe, and noticing which parts of your body are particularly tense. Try to relax the muscles in those areas.   How will I feel when I quit smoking? Day 1 to 3 weeks Within the first 24 hours of quitting smoking, you may start to feel withdrawal symptoms. These symptoms are usually most noticeable 2-3 days after quitting, but they usually do not last for more than 2-3 weeks. You may experience these symptoms:  Mood swings.  Restlessness, anxiety, or irritability.  Trouble concentrating.  Dizziness.  Strong cravings for sugary foods and nicotine.  Mild weight gain.  Constipation.  Nausea.  Coughing or a sore throat.  Changes in how the medicines that you take for unrelated issues work in your body.  Depression.  Trouble sleeping (insomnia). Week 3 and afterward After the first 2-3 weeks of quitting, you may start to notice more positive results, such as:  Improved sense of smell and taste.  Decreased coughing and sore throat.  Slower heart rate.  Lower blood pressure.  Clearer skin.  The ability to breathe more easily.  Fewer sick days. Quitting smoking can be very challenging. Do not get discouraged if you are not successful the first time. Some people need to make many attempts to quit before they achieve long-term success.  Do your best to stick to your quit plan, and talk with your health care provider if you have any questions or  concerns. Summary  Smoking tobacco is the leading cause of preventable death. Quitting smoking is one of the best things that you can do for your health.  When you decide to quit smoking, create a plan to help you succeed.  Quit smoking right away, not slowly over a period of time.  When you start quitting, seek help from your health care provider, family, or friends. This information is not intended to replace advice given to you by your health care provider. Make sure you discuss any questions you have with your health care provider. Document Revised: 04/18/2019 Document Reviewed: 10/12/2018 Elsevier Patient Education  [place].

## 2020-10-22 NOTE — Progress Notes (Signed)
MEDICARE ANNUAL WELLNESS VISIT  10/22/2020  Subjective:  [name] is a 75 y.o. female patient of Metheney, Rene Kocher, MD who had a Medicare Annual Wellness Visit today. [name] is Disabled and lives alone. she has 2 children. she reports that she is not socially active and does interact with friends/family regularly. she is minimally physically active and enjoys audio books.  Patient Care Team: Hali Marry, MD as PCP - General (Family Medicine) Christin Fudge., MD as Referring Physician (Psychiatry) Kriste Basque, MD as Referring Physician (Pain Medicine)  Advanced Directives 10/22/2020 09/17/2015 09/11/2014 01/14/2014  Does Patient Have a Medical Advance Directive? Yes Yes Yes Patient has advance directive, copy not in chart  Type of Advance Directive [place];Living will [place];Living will [place];Living will [place];Living will  Does patient want to make changes to medical advance directive? No - Patient declined No - Patient declined - -  Copy of [place] in Chart? No - copy requested No - copy requested No - copy requested -  Pre-existing out of facility DNR order (yellow form or pink MOST form) - - - Yellow form placed in chart (order not valid for inpatient use)    Hospital Utilization Over the Past 12 Months: # of hospitalizations or ER visits: 3 # of surgeries: 0  Review of Systems    Patient reports that her overall health is worse when compared to last year.  Review of Systems: History obtained from chart review and the patient  All other systems negative.  Pain Assessment Pain : No/denies pain     Current Medications & Allergies (verified) Allergies as of 10/22/2020      Reactions   Varenicline Nausea And Vomiting      Medication List       Accurate as of October 22, 2020  3:45 PM. If you have any questions, ask your nurse or doctor.         albuterol 1.25 MG/3ML nebulizer solution Commonly known as: ACCUNEB Take 3 mLs (1.25 mg total) by nebulization every 6 (six) hours as needed for wheezing or shortness of breath.   alendronate 70 MG tablet Commonly known as: FOSAMAX TAKE 1 TABLET BY MOUTH EVERY 7 DAYS. TAKE IN THE MORNING WITH A FULL GLASS OF WATER ON AN EMPTY STOMACH. DO NOT TAKE OTHER FOOD OR DRINK OR LIE DOWN   AMBULATORY NON FORMULARY MEDICATION Medication Name: Nebulizer machine with equipment.  Diagnosis COPD.  Patient is primarily homebound.  Is faxed to [facility]. She is an established patient.   aspirin EC 81 MG tablet Take 81 mg by mouth daily.   blood glucose meter kit and supplies Kit Dispense based on patient and insurance preference. Use up to four times daily as directed Dx E11.40   calcium carbonate 600 MG tablet Commonly known as: OS-CAL Take by mouth.   clopidogrel 75 MG tablet Commonly known as: PLAVIX TAKE ONE TABLET BY MOUTH EVERY DAY   dicyclomine 20 MG tablet Commonly known as: BENTYL TAKE ONE TABLET BY MOUTH THREE TIMES DAILY AS NEEDED FOR SPASMS   diphenoxylate-atropine 2.5-0.025 MG tablet Commonly known as: LOMOTIL TAKE ONE OR TWO TABLETS BY MOUTH TWICE DAILY AS NEEDED FOR DIARRHEA OR LOOSE STOOLS   FLUoxetine 40 MG capsule Commonly known as: PROZAC Take 2 capsules (80 mg total) by mouth daily.   furosemide 40 MG tablet Commonly known as: LASIX Take 1 or 2 tablets by mouth  every morning   gabapentin 400 MG capsule Commonly known as: NEURONTIN Take 400 mg with breakfast, lunch and dinner. Take 800 mg before bed   Gamunex-C 20 GM/200ML Soln Generic drug: Immune Globulin (Human)   hydrocortisone 2.5 % rectal cream Commonly known as: ANUSOL-HC Place rectally 2 (two) times daily.   Lancets Misc by Does not apply route.   lansoprazole 30 MG capsule Commonly known as: Prevacid Take 1 capsule (30 mg total) by mouth daily at 12 noon.   Lantus SoloStar 100 UNIT/ML Solostar  Pen Generic drug: insulin glargine Inject 10-15 Units into the skin at bedtime.   levalbuterol 0.63 MG/3ML nebulizer solution Commonly known as: XOPENEX Take 3 mLs (0.63 mg total) by nebulization every 8 (eight) hours as needed for wheezing or shortness of breath.   levothyroxine 125 MCG tablet Commonly known as: SYNTHROID Take 1 tablet (125 mcg total) by mouth daily.   lidocaine 5 % ointment Commonly known as: XYLOCAINE Apply 1 application topically daily. 2 hours before skin procedure.   meclizine 25 MG tablet Commonly known as: ANTIVERT Take 1 tablet (25 mg total) by mouth 3 (three) times daily as needed for dizziness.   metFORMIN 500 MG tablet Commonly known as: GLUCOPHAGE TAKE ONE TABLET BY MOUTH THREE TIMES DAILY   metoprolol tartrate 50 MG tablet Commonly known as: LOPRESSOR Take 50 mg by mouth 2 (two) times daily.   mycophenolate 500 MG tablet Commonly known as: CELLCEPT Take by mouth.   nitroGLYCERIN 0.4 MG SL tablet Commonly known as: NITROSTAT Place 1 tablet (0.4 mg total) under the tongue every 5 (five) minutes as needed.   phenylephrine-shark liver oil-mineral oil-petrolatum 0.25-14-74.9 % rectal ointment Commonly known as: PREPARATION H Place 1 application rectally 2 (two) times daily as needed for hemorrhoids.   potassium chloride SA 20 MEQ tablet Commonly known as: KLOR-CON Take 1 tablet (20 mEq total) by mouth daily.   PreserVision AREDS 2 Caps   Prodigy No Coding Blood Gluc test strip Generic drug: glucose blood USE AS DIRECTED   promethazine 25 MG tablet Commonly known as: PHENERGAN Take 1 tablet (25 mg total) by mouth every 8 (eight) hours as needed for nausea or vomiting.   simvastatin 40 MG tablet Commonly known as: ZOCOR TAKE ONE TABLET BY MOUTH EVERY DAY AT SIX IN THE EVENING   Trelegy Ellipta 100-62.5-25 MCG/INH Aepb Generic drug: Fluticasone-Umeclidin-Vilant Inhale 1 puff into the lungs every morning.   TRUEplus Pen Needles 32G X  4 MM Misc Generic drug: Insulin Pen Needle USE WHEN INJECTING INSULIN DAILY   zolpidem 5 MG tablet Commonly known as: AMBIEN Take 1 tablet (5 mg total) by mouth at bedtime.       History (reviewed): Past Medical History:  Diagnosis Date  . Arthritis   . Charcot-Marie-Tooth disease   . COPD (chronic obstructive pulmonary disease) ([place])   . DDD (degenerative disc disease)    Lumbar and lumbosacral  . Depression   . Diabetes mellitus    type 2  . Diabetic peripheral neuropathy ([place])   . Facet syndrome, lumbar   . Gastric ulcer    w/o hemorrhage  . Hyperlipidemia   . Hypertension   . Insomnia   . Lumbosacral root lesions, not elsewhere classified   . Neurogenic bladder   . Obesity   . Osteopenia   . Other symptoms referable to back   . Post-menopausal   . PUD (peptic ulcer disease)   . 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 Spinal stenosis, lumbar region, without neurogenic claudication   . Thoracic spondylosis without myelopathy   . Thyroid disease    hypo  . Tobacco dependence   . Unspecified hereditary and idiopathic peripheral neuropathy    Past Surgical History:  Procedure Laterality Date  . ANKLE RECONSTRUCTION     left ankle, plate and pins   . bilateral L3 medial branch block    . CHOLECYSTECTOMY    . fibroid tumor removal    . fluoroscopic L5 dorsal medial branch bloc    . RENAL ARTERY STENT  11/28/2010   left  . REPLACEMENT TOTAL KNEE     Family History  Problem Relation Age of Onset  . Stroke Mother   . Heart disease Father 47       heart attack  . Hypertension Father   . Cancer Father        lung  . Diabetes Paternal Aunt        insulin dependent   Social History   Socioeconomic History  . Marital status: Widowed    Spouse name: Not on file  . Number of children: 2  . Years of education: 73  . Highest education level: Some college, no degree  Occupational History  . Not on file  Tobacco Use  . Smoking status:  Current Every Day Smoker    Packs/day: 0.50    Years: 49.00    Pack years: 24.50    Types: Cigarettes  . Smokeless tobacco: Never Used  Vaping Use  . Vaping Use: Never used  Substance and Sexual Activity  . Alcohol use: No    Alcohol/week: 0.0 standard drinks  . Drug use: No  . Sexual activity: Not Currently  Other Topics Concern  . Not on file  Social History Narrative   Lives alone but her daughter lives close by and helps when she can. She has a cat. She is not able to read any more due to macular degeneration but she does listen to audio books.    Social Determinants of Health   Financial Resource Strain: Low Risk   . Difficulty of Paying Living Expenses: Not hard at all  Food Insecurity: No Food Insecurity  . Worried About Charity fundraiser in the Last Year: Never true  . Ran Out of Food in the Last Year: Never true  Transportation Needs: No Transportation Needs  . Lack of Transportation (Medical): No  . Lack of Transportation (Non-Medical): No  Physical Activity: Sufficiently Active  . Days of Exercise per Week: 4 days  . Minutes of Exercise per Session: 60 min  Stress: No Stress Concern Present  . Feeling of Stress : Not at all  Social Connections: Socially Isolated  . Frequency of Communication with Friends and Family: More than three times a week  . Frequency of Social Gatherings with Friends and Family: Twice a week  . Attends Religious Services: Never  . Active Member of Clubs or Organizations: Patient refused  . Attends Archivist Meetings: Never  . Marital Status: Widowed    Activities of Daily Living In your present state of health, do you have any difficulty performing the following activities: 10/22/2020  Hearing? N  Vision? Y  Comment macular generation in both eyes.  Difficulty concentrating or making decisions? N  Walking or climbing stairs? Y  Comment yes; she uses a rollator; and is in physical therapy.  Dressing or bathing? N  Doing  errands, shopping? Darreld Mclean  Comment she doesn't drive so usually she goes to the doctor's office with her daughter or friend; groceries are usually delivered to her home.  Preparing Food and eating ? N  Using the Toilet? N  In the past six months, have you accidently leaked urine? N  Do you have problems with loss of bowel control? Y  Comment chronic diarrhea and has had a few accidents at times.  Managing your Medications? Y  Comment her daughter helps with making her pill box  Managing your Finances? Y  Comment Her daughter helps with that due to her macular degeneration  Housekeeping or managing your Housekeeping? Y  Comment She had a cleaning day but she stopped coming after covid started; her daughter helps when she can.  Some recent data might be hidden    Patient Education/Literacy How often do you need to have someone help you when you read instructions, pamphlets, or other written materials from your doctor or pharmacy?: 1 - Never What is the last grade level you completed in school?: 12th grade and one year of college  Exercise Current Exercise Habits: Home exercise routine (physical therapy and occupational therapy comes to her house), Type of exercise: Other - see comments;stretching;walking (physical therapy and occupational therapy), Time (Minutes): 60, Frequency (Times/Week): 4, Weekly Exercise (Minutes/Week): 240, Intensity: Moderate, Exercise limited by: orthopedic condition(s)  Diet Patient reports consuming 2 meals a day and 1-2 snack(s) a day Patient reports that her primary diet is: Regular Patient reports that she does have regular access to food.   Depression Screen PHQ 2/9 Scores 10/22/2020 05/06/2020 11/14/2018 11/13/2017 12/05/2016 12/05/2016 08/03/2016  PHQ - 2 Score 0 4 1 0 0 0 6  PHQ- 9 Score - 13 - - 5 - 21     Fall Risk Fall Risk  10/22/2020 11/17/2019 05/02/2019 11/14/2018 11/13/2017  Falls in the past year? _0 Yes  Number falls in past yr: _1 Injury with  Fall? 1 1 0 1 No  Risk for fall due to : Impaired balance/gait;Orthopedic patient Orthopedic patient;Impaired mobility Impaired balance/gait Impaired mobility Impaired mobility;Impaired vision  Follow up Falls evaluation completed;Falls prevention discussed;Education provided Falls evaluation completed;Falls prevention discussed Falls prevention discussed Falls prevention discussed Falls prevention discussed  Comment - pt uses rollator - - -     Objective:   BP (!) 86/53 (BP Location: Right Arm, Patient Position: Sitting, Cuff Size: Normal)   Pulse 60   Temp 99.1 F (37.3 C) (Oral)   Resp 18   Ht _2  (1.702 m)   Wt 184 lb (83.5 kg)   SpO2 98%   BMI 28.82 kg/m   Last Weight  Most recent update: 10/22/2020  3:45 PM   Weight  83.5 kg (184 lb)            Body mass index is 28.82 kg/m.  Hearing/Vision  . Dionne did not have difficulty with hearing/understanding during the face-to-face interview . Tattiana did not have difficulty with her vision during the face-to-face interview . Reports that she has had a formal eye exam by an eye care professional within the past year . Reports that she has not had a formal hearing evaluation within the past year  Cognitive Function: 6CIT Screen 10/22/2020  What Year? 4 points  What month? 0 points  What time? 0 points  Count back from 20 0 points  Months in reverse 0 points  Repeat phrase 0 points  Total Score 4  Normal Cognitive Function Screening: Yes (Normal:0-7, Significant for Dysfunction: >8)  Immunization & Health Maintenance Record Immunization History  Administered Date(s) Administered  . Fluad Quad(high Dose 65+) 05/02/2019  . H1N1 06/19/2008  . Influenza Split 05/06/2012  . Influenza Whole 05/28/2006, 06/13/2007, 05/28/2008, 07/05/2009, 05/06/2010, 04/24/2011  . Influenza, High Dose Seasonal PF 05/09/2018, 05/02/2019  . Influenza,inj,Quad PF,6+ Mos 05/07/2013, 03/27/2014, 05/03/2015, 04/03/2016  . Influenza-Unspecified  05/06/2020  . Janssen (J&J) SARS-COV-2 Vaccination 10/31/2019  . Pneumococcal Conjugate-13 04/27/2014  . Pneumococcal Polysaccharide-23 06/07/2001, 08/15/2006, 01/09/2012  . Tdap 10/06/2011    Health Maintenance  Topic Date Due  . COVID-19 Vaccine (2 - Booster for Janssen series) 11/07/2020 (Originally 12/26/2019)  . URINE MICROALBUMIN  11/22/2020 (Originally 12/05/2017)  . FOOT EXAM  10/22/2021 (Originally 05/01/2020)  . HEMOGLOBIN A1C  11/03/2020  . OPHTHALMOLOGY EXAM  03/10/2021  . TETANUS/TDAP  10/05/2021  . INFLUENZA VACCINE  Completed  . DEXA SCAN  Completed  . Hepatitis C Screening  Completed  . PNA vac Low Risk Adult  Completed  . HPV VACCINES  Aged Out       Assessment  This is a routine wellness examination for [employer].  Health Maintenance: Due or Overdue There are no preventive care reminders to display for this patient.  Channing Savich does not need a referral for [employer] Assistance: Care Management:   no Social Work:    no Prescription Assistance:  no Nutrition/Diabetes Education:  no   Plan:  Personalized Goals Goals Addressed              This Visit's Progress   .  Patient Stated (pt-stated)        10/22/2020 AWV Goal: Improved Nutrition/Diet  . Patient will verbalize understanding that diet plays an important role in overall health and that a poor diet is a risk factor for many chronic medical conditions.  . Over the next year, patient will improve self management of their diet by incorporating more water throughout the day. . Patient will utilize available community resources to help with food acquisition if needed (ex: food pantries, [address], etc) . Patient will work with nutrition specialist if a referral was made       Personalized Health Maintenance & Screening Recommendations  Foot exam, Bone density and urine microalbumin Shingrix - patient refused at this time. Bone density- patient refused at this time.  Lung Cancer Screening  Recommended: yes ; patient refused and stated she has had tumor markers done recently (Low Dose CT Chest recommended if Age 27-80 years, 30 pack-year currently smoking OR have quit w/in past 15 years) Hepatitis C Screening recommended: no HIV Screening recommended: no  Advanced Directives: Written information was not given per the patient's request.  Referrals & Orders No orders of the defined types were placed in this encounter.   Follow-up Plan . Follow-up with Hali Marry, MD as planned . At your next in office visit; we will do hemoglobin a1c; foot exam and urine microalbumin. . Medicare wellness in one year.    I have personally reviewed and noted the following in the patient's chart:   . Medical and social history . Use of alcohol, tobacco or illicit drugs  . Current medications and supplements . Functional ability and status . Nutritional status . Physical activity . Advanced directives . List of other physicians . Hospitalizations, surgeries, and ER visits in previous 12 months . Vitals . Screenings to include cognitive, depression, and falls . Referrals and appointments  In addition, I have reviewed and discussed with patient certain preventive protocols, quality metrics, and best practice recommendations. A written personalized care plan for preventive services as well as general preventive health recommendations were provided to patient.     Tinnie Gens, RN  10/22/2020

## 2020-10-25 DIAGNOSIS — I10 Essential (primary) hypertension: Secondary | ICD-10-CM | POA: Diagnosis not present

## 2020-10-25 DIAGNOSIS — J9611 Chronic respiratory failure with hypoxia: Secondary | ICD-10-CM | POA: Diagnosis not present

## 2020-10-25 DIAGNOSIS — G6181 Chronic inflammatory demyelinating polyneuritis: Secondary | ICD-10-CM | POA: Diagnosis not present

## 2020-10-25 DIAGNOSIS — N179 Acute kidney failure, unspecified: Secondary | ICD-10-CM | POA: Diagnosis not present

## 2020-10-25 DIAGNOSIS — E1165 Type 2 diabetes mellitus with hyperglycemia: Secondary | ICD-10-CM | POA: Diagnosis not present

## 2020-10-25 DIAGNOSIS — K529 Noninfective gastroenteritis and colitis, unspecified: Secondary | ICD-10-CM | POA: Diagnosis not present

## 2020-10-26 ENCOUNTER — Telehealth: Payer: Self-pay

## 2020-10-26 DIAGNOSIS — Z7984 Long term (current) use of oral hypoglycemic drugs: Secondary | ICD-10-CM | POA: Diagnosis not present

## 2020-10-26 DIAGNOSIS — M21372 Foot drop, left foot: Secondary | ICD-10-CM

## 2020-10-26 DIAGNOSIS — J9611 Chronic respiratory failure with hypoxia: Secondary | ICD-10-CM | POA: Diagnosis not present

## 2020-10-26 DIAGNOSIS — I714 Abdominal aortic aneurysm, without rupture: Secondary | ICD-10-CM | POA: Diagnosis not present

## 2020-10-26 DIAGNOSIS — E785 Hyperlipidemia, unspecified: Secondary | ICD-10-CM | POA: Diagnosis not present

## 2020-10-26 DIAGNOSIS — M21371 Foot drop, right foot: Secondary | ICD-10-CM

## 2020-10-26 DIAGNOSIS — F32A Depression, unspecified: Secondary | ICD-10-CM | POA: Diagnosis not present

## 2020-10-26 DIAGNOSIS — N179 Acute kidney failure, unspecified: Secondary | ICD-10-CM | POA: Diagnosis not present

## 2020-10-26 DIAGNOSIS — Z9181 History of falling: Secondary | ICD-10-CM

## 2020-10-26 DIAGNOSIS — I10 Essential (primary) hypertension: Secondary | ICD-10-CM | POA: Diagnosis not present

## 2020-10-26 DIAGNOSIS — E1165 Type 2 diabetes mellitus with hyperglycemia: Secondary | ICD-10-CM | POA: Diagnosis not present

## 2020-10-26 DIAGNOSIS — K529 Noninfective gastroenteritis and colitis, unspecified: Secondary | ICD-10-CM | POA: Diagnosis not present

## 2020-10-26 DIAGNOSIS — Z7902 Long term (current) use of antithrombotics/antiplatelets: Secondary | ICD-10-CM | POA: Diagnosis not present

## 2020-10-26 DIAGNOSIS — G6181 Chronic inflammatory demyelinating polyneuritis: Secondary | ICD-10-CM | POA: Diagnosis not present

## 2020-10-26 DIAGNOSIS — K589 Irritable bowel syndrome without diarrhea: Secondary | ICD-10-CM | POA: Diagnosis not present

## 2020-10-26 DIAGNOSIS — E039 Hypothyroidism, unspecified: Secondary | ICD-10-CM | POA: Diagnosis not present

## 2020-10-26 DIAGNOSIS — G4733 Obstructive sleep apnea (adult) (pediatric): Secondary | ICD-10-CM | POA: Diagnosis not present

## 2020-10-26 DIAGNOSIS — Z9981 Dependence on supplemental oxygen: Secondary | ICD-10-CM | POA: Diagnosis not present

## 2020-10-26 DIAGNOSIS — Z7983 Long term (current) use of bisphosphonates: Secondary | ICD-10-CM | POA: Diagnosis not present

## 2020-10-26 DIAGNOSIS — F17210 Nicotine dependence, cigarettes, uncomplicated: Secondary | ICD-10-CM | POA: Diagnosis not present

## 2020-10-26 DIAGNOSIS — E1136 Type 2 diabetes mellitus with diabetic cataract: Secondary | ICD-10-CM | POA: Diagnosis not present

## 2020-10-26 NOTE — Telephone Encounter (Signed)
[name] from [facility] and states [name] need bilateral AFO's. She states she needs a referral to a Podiatrist.   Bilateral foot drop. She is a high fall risk.   Pended referral.

## 2020-10-26 NOTE — Telephone Encounter (Signed)
If for foot drop then needs referral to Neurology.  Just want to clarify.

## 2020-10-27 MED ORDER — AMBULATORY NON FORMULARY MEDICATION: 0 refills | Status: DC

## 2020-10-27 NOTE — Telephone Encounter (Signed)
I am not sure. The home health nurse just said she needs brace/shoes.

## 2020-10-29 DIAGNOSIS — K529 Noninfective gastroenteritis and colitis, unspecified: Secondary | ICD-10-CM | POA: Diagnosis not present

## 2020-10-29 DIAGNOSIS — G6181 Chronic inflammatory demyelinating polyneuritis: Secondary | ICD-10-CM | POA: Diagnosis not present

## 2020-10-29 DIAGNOSIS — N179 Acute kidney failure, unspecified: Secondary | ICD-10-CM | POA: Diagnosis not present

## 2020-10-29 DIAGNOSIS — E1165 Type 2 diabetes mellitus with hyperglycemia: Secondary | ICD-10-CM | POA: Diagnosis not present

## 2020-10-29 DIAGNOSIS — I10 Essential (primary) hypertension: Secondary | ICD-10-CM | POA: Diagnosis not present

## 2020-10-29 DIAGNOSIS — J9611 Chronic respiratory failure with hypoxia: Secondary | ICD-10-CM | POA: Diagnosis not present

## 2020-11-01 DIAGNOSIS — K529 Noninfective gastroenteritis and colitis, unspecified: Secondary | ICD-10-CM | POA: Diagnosis not present

## 2020-11-01 DIAGNOSIS — I10 Essential (primary) hypertension: Secondary | ICD-10-CM | POA: Diagnosis not present

## 2020-11-01 DIAGNOSIS — J9611 Chronic respiratory failure with hypoxia: Secondary | ICD-10-CM | POA: Diagnosis not present

## 2020-11-01 DIAGNOSIS — G6181 Chronic inflammatory demyelinating polyneuritis: Secondary | ICD-10-CM | POA: Diagnosis not present

## 2020-11-01 DIAGNOSIS — E1165 Type 2 diabetes mellitus with hyperglycemia: Secondary | ICD-10-CM | POA: Diagnosis not present

## 2020-11-01 DIAGNOSIS — N179 Acute kidney failure, unspecified: Secondary | ICD-10-CM | POA: Diagnosis not present

## 2020-11-02 ENCOUNTER — Other Ambulatory Visit: Payer: Self-pay | Admitting: Family Medicine

## 2020-11-02 DIAGNOSIS — N179 Acute kidney failure, unspecified: Secondary | ICD-10-CM | POA: Diagnosis not present

## 2020-11-02 DIAGNOSIS — I10 Essential (primary) hypertension: Secondary | ICD-10-CM | POA: Diagnosis not present

## 2020-11-02 DIAGNOSIS — G6181 Chronic inflammatory demyelinating polyneuritis: Secondary | ICD-10-CM | POA: Diagnosis not present

## 2020-11-02 DIAGNOSIS — E1165 Type 2 diabetes mellitus with hyperglycemia: Secondary | ICD-10-CM | POA: Diagnosis not present

## 2020-11-02 DIAGNOSIS — J9611 Chronic respiratory failure with hypoxia: Secondary | ICD-10-CM | POA: Diagnosis not present

## 2020-11-02 DIAGNOSIS — K529 Noninfective gastroenteritis and colitis, unspecified: Secondary | ICD-10-CM | POA: Diagnosis not present

## 2020-11-03 DIAGNOSIS — I10 Essential (primary) hypertension: Secondary | ICD-10-CM | POA: Diagnosis not present

## 2020-11-03 DIAGNOSIS — G6181 Chronic inflammatory demyelinating polyneuritis: Secondary | ICD-10-CM | POA: Diagnosis not present

## 2020-11-03 DIAGNOSIS — J9611 Chronic respiratory failure with hypoxia: Secondary | ICD-10-CM | POA: Diagnosis not present

## 2020-11-03 DIAGNOSIS — E1165 Type 2 diabetes mellitus with hyperglycemia: Secondary | ICD-10-CM | POA: Diagnosis not present

## 2020-11-03 DIAGNOSIS — K529 Noninfective gastroenteritis and colitis, unspecified: Secondary | ICD-10-CM | POA: Diagnosis not present

## 2020-11-03 DIAGNOSIS — N179 Acute kidney failure, unspecified: Secondary | ICD-10-CM | POA: Diagnosis not present

## 2020-11-04 ENCOUNTER — Telehealth: Payer: Self-pay

## 2020-11-04 NOTE — Telephone Encounter (Signed)
I agree to care plan as below.

## 2020-11-04 NOTE — Telephone Encounter (Signed)
Gearldine Bienenstock from [facility] called and stated he believes she would benefit from additional PT. He stated "2 week 2, 1 week 1". His callback number is [phone].

## 2020-11-09 ENCOUNTER — Telehealth: Payer: Self-pay | Admitting: *Deleted

## 2020-11-09 ENCOUNTER — Telehealth: Payer: Self-pay

## 2020-11-09 ENCOUNTER — Other Ambulatory Visit: Payer: Self-pay | Admitting: Family Medicine

## 2020-11-09 DIAGNOSIS — E1165 Type 2 diabetes mellitus with hyperglycemia: Secondary | ICD-10-CM | POA: Diagnosis not present

## 2020-11-09 DIAGNOSIS — G6181 Chronic inflammatory demyelinating polyneuritis: Secondary | ICD-10-CM | POA: Diagnosis not present

## 2020-11-09 DIAGNOSIS — J9611 Chronic respiratory failure with hypoxia: Secondary | ICD-10-CM | POA: Diagnosis not present

## 2020-11-09 DIAGNOSIS — N179 Acute kidney failure, unspecified: Secondary | ICD-10-CM | POA: Diagnosis not present

## 2020-11-09 DIAGNOSIS — I10 Essential (primary) hypertension: Secondary | ICD-10-CM | POA: Diagnosis not present

## 2020-11-09 DIAGNOSIS — K529 Noninfective gastroenteritis and colitis, unspecified: Secondary | ICD-10-CM | POA: Diagnosis not present

## 2020-11-09 NOTE — Telephone Encounter (Signed)
Nurse w/[facility] lvm informing that pt has had 4th and 5th fingers on R hand be numb and she has has some slurred speech for the past 2-3 days.

## 2020-11-09 NOTE — Telephone Encounter (Signed)
Called [name] back lvm advising that pt should go to the ED to be checked for any sign of stroke.

## 2020-11-09 NOTE — Telephone Encounter (Signed)
[name] [phone]) a nurse from [facility] called and said that today, [name] stated that her fingers felt numb and that her speech was slurred. The home health nurse [name] stated that she did not notice any differences from [name]'s usual but urged [name] to go to the hospital. Home health nurse states that [name] refused to go to the hospital and stated if it get's worse she would go. I attempted to reach [name] by phone, her phone rang without an answer and no voicemail was setup.

## 2020-11-12 DIAGNOSIS — N179 Acute kidney failure, unspecified: Secondary | ICD-10-CM | POA: Diagnosis not present

## 2020-11-12 DIAGNOSIS — I10 Essential (primary) hypertension: Secondary | ICD-10-CM | POA: Diagnosis not present

## 2020-11-12 DIAGNOSIS — J9611 Chronic respiratory failure with hypoxia: Secondary | ICD-10-CM | POA: Diagnosis not present

## 2020-11-12 DIAGNOSIS — E1165 Type 2 diabetes mellitus with hyperglycemia: Secondary | ICD-10-CM | POA: Diagnosis not present

## 2020-11-12 DIAGNOSIS — G6181 Chronic inflammatory demyelinating polyneuritis: Secondary | ICD-10-CM | POA: Diagnosis not present

## 2020-11-12 DIAGNOSIS — K529 Noninfective gastroenteritis and colitis, unspecified: Secondary | ICD-10-CM | POA: Diagnosis not present

## 2020-11-15 ENCOUNTER — Other Ambulatory Visit: Payer: Self-pay | Admitting: Family Medicine

## 2020-11-15 DIAGNOSIS — E1165 Type 2 diabetes mellitus with hyperglycemia: Secondary | ICD-10-CM | POA: Diagnosis not present

## 2020-11-15 DIAGNOSIS — G6181 Chronic inflammatory demyelinating polyneuritis: Secondary | ICD-10-CM | POA: Diagnosis not present

## 2020-11-15 DIAGNOSIS — I10 Essential (primary) hypertension: Secondary | ICD-10-CM | POA: Diagnosis not present

## 2020-11-15 DIAGNOSIS — J9611 Chronic respiratory failure with hypoxia: Secondary | ICD-10-CM | POA: Diagnosis not present

## 2020-11-15 DIAGNOSIS — N179 Acute kidney failure, unspecified: Secondary | ICD-10-CM | POA: Diagnosis not present

## 2020-11-15 DIAGNOSIS — K529 Noninfective gastroenteritis and colitis, unspecified: Secondary | ICD-10-CM | POA: Diagnosis not present

## 2020-11-17 DIAGNOSIS — J9611 Chronic respiratory failure with hypoxia: Secondary | ICD-10-CM | POA: Diagnosis not present

## 2020-11-17 DIAGNOSIS — I10 Essential (primary) hypertension: Secondary | ICD-10-CM | POA: Diagnosis not present

## 2020-11-17 DIAGNOSIS — K529 Noninfective gastroenteritis and colitis, unspecified: Secondary | ICD-10-CM | POA: Diagnosis not present

## 2020-11-17 DIAGNOSIS — E1165 Type 2 diabetes mellitus with hyperglycemia: Secondary | ICD-10-CM | POA: Diagnosis not present

## 2020-11-17 DIAGNOSIS — G6181 Chronic inflammatory demyelinating polyneuritis: Secondary | ICD-10-CM | POA: Diagnosis not present

## 2020-11-17 DIAGNOSIS — N179 Acute kidney failure, unspecified: Secondary | ICD-10-CM | POA: Diagnosis not present

## 2020-11-19 DIAGNOSIS — G6181 Chronic inflammatory demyelinating polyneuritis: Secondary | ICD-10-CM | POA: Diagnosis not present

## 2020-11-19 DIAGNOSIS — J9611 Chronic respiratory failure with hypoxia: Secondary | ICD-10-CM | POA: Diagnosis not present

## 2020-11-19 DIAGNOSIS — K529 Noninfective gastroenteritis and colitis, unspecified: Secondary | ICD-10-CM | POA: Diagnosis not present

## 2020-11-19 DIAGNOSIS — E1165 Type 2 diabetes mellitus with hyperglycemia: Secondary | ICD-10-CM | POA: Diagnosis not present

## 2020-11-19 DIAGNOSIS — N179 Acute kidney failure, unspecified: Secondary | ICD-10-CM | POA: Diagnosis not present

## 2020-11-19 DIAGNOSIS — I10 Essential (primary) hypertension: Secondary | ICD-10-CM | POA: Diagnosis not present

## 2020-11-24 ENCOUNTER — Ambulatory Visit (INDEPENDENT_AMBULATORY_CARE_PROVIDER_SITE_OTHER): Payer: Medicare Other | Admitting: Family Medicine

## 2020-11-24 ENCOUNTER — Encounter: Payer: Self-pay | Admitting: Family Medicine

## 2020-11-24 ENCOUNTER — Telehealth: Payer: Self-pay | Admitting: Neurology

## 2020-11-24 ENCOUNTER — Other Ambulatory Visit: Payer: Self-pay

## 2020-11-24 VITALS — BP 94/59 | HR 70

## 2020-11-24 DIAGNOSIS — I959 Hypotension, unspecified: Secondary | ICD-10-CM

## 2020-11-24 DIAGNOSIS — K529 Noninfective gastroenteritis and colitis, unspecified: Secondary | ICD-10-CM | POA: Diagnosis not present

## 2020-11-24 DIAGNOSIS — R2689 Other abnormalities of gait and mobility: Secondary | ICD-10-CM

## 2020-11-24 DIAGNOSIS — R251 Tremor, unspecified: Secondary | ICD-10-CM

## 2020-11-24 DIAGNOSIS — N179 Acute kidney failure, unspecified: Secondary | ICD-10-CM | POA: Diagnosis not present

## 2020-11-24 DIAGNOSIS — E1165 Type 2 diabetes mellitus with hyperglycemia: Secondary | ICD-10-CM | POA: Diagnosis not present

## 2020-11-24 DIAGNOSIS — G6181 Chronic inflammatory demyelinating polyneuritis: Secondary | ICD-10-CM | POA: Diagnosis not present

## 2020-11-24 DIAGNOSIS — R4702 Dysphasia: Secondary | ICD-10-CM | POA: Diagnosis not present

## 2020-11-24 DIAGNOSIS — E871 Hypo-osmolality and hyponatremia: Secondary | ICD-10-CM

## 2020-11-24 DIAGNOSIS — I10 Essential (primary) hypertension: Secondary | ICD-10-CM | POA: Diagnosis not present

## 2020-11-24 DIAGNOSIS — R202 Paresthesia of skin: Secondary | ICD-10-CM

## 2020-11-24 DIAGNOSIS — R479 Unspecified speech disturbances: Secondary | ICD-10-CM | POA: Diagnosis not present

## 2020-11-24 DIAGNOSIS — R110 Nausea: Secondary | ICD-10-CM

## 2020-11-24 DIAGNOSIS — R1110 Vomiting, unspecified: Secondary | ICD-10-CM | POA: Diagnosis not present

## 2020-11-24 DIAGNOSIS — E860 Dehydration: Secondary | ICD-10-CM | POA: Insufficient documentation

## 2020-11-24 DIAGNOSIS — J9611 Chronic respiratory failure with hypoxia: Secondary | ICD-10-CM | POA: Diagnosis not present

## 2020-11-24 NOTE — Assessment & Plan Note (Signed)
She is having numbness in the fourth and fifth fingers on the right hand consistent with a radicular neuropathy.  She does sometimes have some chronic neck pain but its not worse or changed recently.  Neck with normal range of motion.

## 2020-11-24 NOTE — Assessment & Plan Note (Signed)
She reports that this came on more abruptly over the last couple of weeks as well as some voice change its much higher pitched.  Unclear what may have caused this consider stroke.  She has an extremely dry mouth.  In fact the tongue depressor stuck to her tongue on exam.  She says that the dry mouth is chronic and has been going on for years and is not new.  Also consider that it could be part of her worsening neuropathy.

## 2020-11-24 NOTE — Assessment & Plan Note (Signed)
Worse on the left compared to the right.  Seems more consistent with an essential tremor tremor is most prominent with holding her hands out and is absent at rest.  It worsens with intention.  No cogwheeling on exam.

## 2020-11-24 NOTE — Assessment & Plan Note (Signed)
Would like to recheck sodium today since she has a history of hyponatremia and has had persistent nausea which can be a side effect from hyponatremia.

## 2020-11-24 NOTE — Assessment & Plan Note (Signed)
She is overdue to get back in with Dr. Ellender Hose.  Encouraged her to call and get back in again I am concerned that some of her symptoms may be related to her worsening neuropathy.  But she is also had a recent significant increase in her tremor as well which seems more consistent with possible essential tremor.

## 2020-11-24 NOTE — Telephone Encounter (Signed)
Levada Dy, RN with [facility], called for a couple things on patient.   1. Patient has a hard time getting out and wanted to know if she could get an order to draw patient's labs?   2. Has some differences in her med list. She will speak with patient's daughter to verify this with her.   3. Patient was discharged from physical therapy because they really couldn't do much, but wanted to order for AFO braces for patients legs, okay to order?

## 2020-11-24 NOTE — Progress Notes (Signed)
Established Patient Office Visit  Subjective:  Patient ID: [name], female    DOB: Jun 30, 1946  Age: 75 y.o. MRN: [id number]  CC:  Chief Complaint  Patient presents with  . Speech Problem    HPI [name] presents for   C/o of slurred speech and feeling like she notices a tremor in her right hand.  Says the symptoms started more recently she says she has had a mild tremor on and off for years but in the last few weeks it suddenly got much more intense.  She notices that she is having difficulty actually saying words she is not necessarily having problems with recall.  She does have an extremely dry mouth but says this is not new either.  She has also noticed some numbness and tingling on the fourth and fifth fingers of her right hand.  In fact she says that started before she noticed the change in speech and the tremor she denies any significant change in weakness of her upper extremities she already has significant weakness in her lower extremities and is wheelchair-bound because of her CDI P.  She feels like her whole upper body is shaking at times.  She denies any facial numbness or tingling.  She denies any sore throat she feels like her voice has become much higher pitched than it used to be and seems to come and go.  She is also concerned because she is having nausea every morning this has been going on for a couple of months she will usually take a Phenergan or Zofran and that seems to help.  About twice a week she will get dry heaves that last for about an hour.  She says the  She also has balance issues.     Past Medical History:  Diagnosis Date  . Arthritis   . Charcot-Marie-Tooth disease   . COPD (chronic obstructive pulmonary disease) ([place])   . DDD (degenerative disc disease)    Lumbar and lumbosacral  . Depression   . Diabetes mellitus    type 2  . Diabetic peripheral neuropathy ([place])   . Facet syndrome, lumbar   . Gastric ulcer    w/o hemorrhage  .  Hyperlipidemia   . Hypertension   . Insomnia   . Lumbosacral root lesions, not elsewhere classified   . Neurogenic bladder   . Obesity   . Osteopenia   . Other symptoms referable to back   . Post-menopausal   . PUD (peptic ulcer disease)   . 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 Spinal stenosis, lumbar region, without neurogenic claudication   . Thoracic spondylosis without myelopathy   . Thyroid disease    hypo  . Tobacco dependence   . Unspecified hereditary and idiopathic peripheral neuropathy     Past Surgical History:  Procedure Laterality Date  . ANKLE RECONSTRUCTION     left ankle, plate and pins   . bilateral L3 medial branch block    . CHOLECYSTECTOMY    . fibroid tumor removal    . fluoroscopic L5 dorsal medial branch bloc    . RENAL ARTERY STENT  11/28/2010   left  . REPLACEMENT TOTAL KNEE      Family History  Problem Relation Age of Onset  . Stroke Mother   . Heart disease Father 70       heart attack  . Hypertension Father   . Cancer Father  lung  . Diabetes Paternal Aunt        insulin dependent    Social History   Socioeconomic History  . Marital status: Widowed    Spouse name: Not on file  . Number of children: 2  . Years of education: 15  . Highest education level: Some college, no degree  Occupational History  . Not on file  Tobacco Use  . Smoking status: Current Every Day Smoker    Packs/day: 0.50    Years: 49.00    Pack years: 24.50    Types: Cigarettes  . Smokeless tobacco: Never Used  Vaping Use  . Vaping Use: Never used  Substance and Sexual Activity  . Alcohol use: No    Alcohol/week: 0.0 standard drinks  . Drug use: No  . Sexual activity: Not Currently  Other Topics Concern  . Not on file  Social History Narrative   Lives alone but her daughter lives close by and helps when she can. She has a cat. She is not able to read any more due to macular degeneration but she does listen to audio books.     Social Determinants of Health   Financial Resource Strain: Low Risk   . Difficulty of Paying Living Expenses: Not hard at all  Food Insecurity: No Food Insecurity  . Worried About Charity fundraiser in the Last Year: Never true  . Ran Out of Food in the Last Year: Never true  Transportation Needs: No Transportation Needs  . Lack of Transportation (Medical): No  . Lack of Transportation (Non-Medical): No  Physical Activity: Sufficiently Active  . Days of Exercise per Week: 4 days  . Minutes of Exercise per Session: 60 min  Stress: No Stress Concern Present  . Feeling of Stress : Not at all  Social Connections: Socially Isolated  . Frequency of Communication with Friends and Family: More than three times a week  . Frequency of Social Gatherings with Friends and Family: Twice a week  . Attends Religious Services: Never  . Active Member of Clubs or Organizations: Patient refused  . Attends Archivist Meetings: Never  . Marital Status: Widowed  Intimate Partner Violence: Not At Risk  . Fear of Current or Ex-Partner: No  . Emotionally Abused: No  . Physically Abused: No  . Sexually Abused: No    Outpatient Medications Prior to Visit  Medication Sig Dispense Refill  . albuterol (ACCUNEB) 1.25 MG/3ML nebulizer solution Take 3 mLs (1.25 mg total) by nebulization every 6 (six) hours as needed for wheezing or shortness of breath. 75 mL 12  . alendronate (FOSAMAX) 70 MG tablet TAKE 1 TABLET BY MOUTH EVERY 7 DAYS. TAKE IN THE MORNING WITH A FULL GLASS OF WATER ON AN EMPTY STOMACH. DO NOT TAKE OTHER FOOD OR DRINK OR LIE DOWN 12 tablet 1  . AMBULATORY NON FORMULARY MEDICATION Medication Name: Nebulizer machine with equipment.  Diagnosis COPD.  Patient is primarily homebound.  Is faxed to [facility]. She is an established patient. 1 Units 0  . AMBULATORY NON FORMULARY MEDICATION Medication Name: Bilateral AFOs for foot drop 2 Units 0  . aspirin EC 81 MG tablet Take 81 mg by mouth  daily.    . blood glucose meter kit and supplies KIT Dispense based on patient and insurance preference. Use up to four times daily as directed Dx E11.40 1 each 0  . calcium carbonate (OS-CAL) 600 MG tablet Take by mouth.    . clopidogrel (PLAVIX) 75 MG  tablet TAKE ONE TABLET BY MOUTH EVERY DAY 90 tablet 11  . dicyclomine (BENTYL) 20 MG tablet TAKE ONE TABLET BY MOUTH THREE TIMES DAILY AS NEEDED FOR SPASMS 90 tablet 2  . diphenoxylate-atropine (LOMOTIL) 2.5-0.025 MG tablet TAKE ONE OR TWO TABLETS BY MOUTH TWICE DAILY AS NEEDED FOR DIARRHEA OR LOOSE STOOLS 60 tablet 5  . FLUoxetine (PROZAC) 40 MG capsule Take 2 capsules (80 mg total) by mouth daily. 180 capsule 2  . furosemide (LASIX) 40 MG tablet Take 1 or 2 tablets by mouth every morning 90 tablet 0  . gabapentin (NEURONTIN) 400 MG capsule Take 400 mg with breakfast, lunch and dinner. Take 800 mg before bed    . GAMUNEX-C 20 GM/200ML SOLN     . hydrocortisone (ANUSOL-HC) 2.5 % rectal cream Place rectally 2 (two) times daily.    . insulin glargine (LANTUS SOLOSTAR) 100 UNIT/ML Solostar Pen Inject 10-15 Units into the skin at bedtime. 15 mL 2  . Lancets MISC by Does not apply route.    . lansoprazole (PREVACID) 30 MG capsule Take 1 capsule (30 mg total) by mouth daily at 12 noon. 90 capsule 0  . levalbuterol (XOPENEX) 0.63 MG/3ML nebulizer solution Take 3 mLs (0.63 mg total) by nebulization every 8 (eight) hours as needed for wheezing or shortness of breath. 60 mL 3  . levothyroxine (SYNTHROID) 125 MCG tablet Take 1 tablet (125 mcg total) by mouth daily. 30 tablet 2  . lidocaine (XYLOCAINE) 5 % ointment Apply 1 application topically daily. 2 hours before skin procedure. 35.44 g 11  . meclizine (ANTIVERT) 25 MG tablet Take 1 tablet (25 mg total) by mouth 3 (three) times daily as needed for dizziness. 30 tablet 6  . metFORMIN (GLUCOPHAGE) 500 MG tablet TAKE ONE TABLET BY MOUTH THREE TIMES DAILY 270 tablet 1  . metoprolol tartrate (LOPRESSOR) 50 MG  tablet Take 50 mg by mouth 2 (two) times daily.    . Multiple Vitamins-Minerals (PRESERVISION AREDS 2) CAPS     . mycophenolate (CELLCEPT) 500 MG tablet Take by mouth.  5  . nitroGLYCERIN (NITROSTAT) 0.4 MG SL tablet Place 1 tablet (0.4 mg total) under the tongue every 5 (five) minutes as needed. 10 tablet 2  . phenylephrine-shark liver oil-mineral oil-petrolatum (PREPARATION H) 0.25-14-74.9 % rectal ointment Place 1 application rectally 2 (two) times daily as needed for hemorrhoids. 28 g 2  . potassium chloride SA (KLOR-CON) 20 MEQ tablet Take 1 tablet (20 mEq total) by mouth daily. 30 tablet 3  . PRODIGY NO CODING BLOOD GLUC test strip USE AS DIRECTED 100 each 0  . promethazine (PHENERGAN) 25 MG tablet Take 1 tablet (25 mg total) by mouth every 8 (eight) hours as needed for nausea or vomiting. 20 tablet 0  . simvastatin (ZOCOR) 40 MG tablet TAKE ONE TABLET BY MOUTH EVERY DAY AT SIX IN THE EVENING 90 tablet 3  . TRELEGY ELLIPTA 100-62.5-25 MCG/INH AEPB Inhale 1 puff into the lungs every morning. 60 each 5  . TRUEPLUS PEN NEEDLES 32G X 4 MM MISC USE WHEN INJECTING INSULIN DAILY 100 each 6  . zolpidem (AMBIEN) 5 MG tablet Take 1 tablet (5 mg total) by mouth at bedtime. 90 tablet 0   No facility-administered medications prior to visit.    Allergies  Allergen Reactions  . Varenicline Nausea And Vomiting    ROS Review of Systems    Objective:    Physical Exam Constitutional:      Appearance: She is well-developed.  HENT:  Head: Normocephalic and atraumatic.  Cardiovascular:     Rate and Rhythm: Normal rate and regular rhythm.     Heart sounds: Normal heart sounds.  Pulmonary:     Effort: Pulmonary effort is normal.     Breath sounds: Normal breath sounds.  Musculoskeletal:     Comments: No lower extremity edema.  Neck with normal flexion extension and rotation right and left.  Skin:    General: Skin is warm and dry.  Neurological:     Mental Status: She is alert and oriented  to person, place, and time.     Comments: Tremor - Worse on the left compared to the right.  Seems more consistent with an essential tremor tremor is most prominent with holding her hands out and is absent at rest.  It worsens with intention.  No cogwheeling on exam.  Psychiatric:        Behavior: Behavior normal.     BP (!) 94/59   Pulse 70   SpO2 100%  Wt Readings from Last 3 Encounters:  10/22/20 184 lb (83.5 kg)  09/17/20 184 lb 0.6 oz (83.5 kg)  05/06/20 208 lb (94.3 kg)     Health Maintenance Due  Topic Date Due  . URINE MICROALBUMIN  12/05/2017  . HEMOGLOBIN A1C  11/03/2020    There are no preventive care reminders to display for this patient.  Lab Results  Component Value Date   TSH 1.61 05/06/2020   Lab Results  Component Value Date   WBC 8.4 10/01/2020   HGB 11.5 (L) 10/01/2020   HCT 35.1 10/01/2020   MCV 86.7 10/01/2020   PLT 234 10/01/2020   Lab Results  Component Value Date   NA 136 10/01/2020   K 3.1 (L) 10/01/2020   CO2 27 10/01/2020   GLUCOSE 210 (H) 10/01/2020   BUN 17 10/01/2020   CREATININE 1.17 (H) 10/01/2020   BILITOT 1.2 09/17/2020   ALKPHOS 51 09/01/2015   AST 10 09/17/2020   ALT 4 (L) 09/17/2020   PROT 8.8 (H) 09/17/2020   ALBUMIN 3.4 (L) 09/01/2015   CALCIUM 8.3 (L) 10/01/2020   Lab Results  Component Value Date   CHOL 128 09/01/2015   Lab Results  Component Value Date   HDL 42 (L) 09/01/2015   Lab Results  Component Value Date   LDLCALC 61 09/01/2015   Lab Results  Component Value Date   TRIG 124 09/01/2015   Lab Results  Component Value Date   CHOLHDL 3.0 09/01/2015   Lab Results  Component Value Date   HGBA1C 5.3 05/06/2020      Assessment & Plan:   Problem List Items Addressed This Visit      Cardiovascular and Mediastinum   HYPERTENSION, BENIGN SYSTEMIC    Her blood pressure is actually low today.  She has been taking her furosemide daily we discussed taking it every other day she has not had any recent  swelling she usually gets swelling in one of her feet when she is volume overloaded.  We may need to even decrease her dose more than that but will start with every other day she already took her medication for today really encouraged her to push oral fluids.  She says she is getting an infusion tomorrow        Nervous and Auditory   CIDP (chronic inflammatory demyelinating polyneuropathy) (HCC)    She is overdue to get back in with Dr. Ellender Hose.  Encouraged her to call and get back in  again I am concerned that some of her symptoms may be related to her worsening neuropathy.  But she is also had a recent significant increase in her tremor as well which seems more consistent with possible essential tremor.        Other   Tremor of both hands    Worse on the left compared to the right.  Seems more consistent with an essential tremor tremor is most prominent with holding her hands out and is absent at rest.  It worsens with intention.  No cogwheeling on exam.      Paresthesias in right hand    She is having numbness in the fourth and fifth fingers on the right hand consistent with a radicular neuropathy.  She does sometimes have some chronic neck pain but its not worse or changed recently.  Neck with normal range of motion.      Hyponatremia    Would like to recheck sodium today since she has a history of hyponatremia and has had persistent nausea which can be a side effect from hyponatremia.      Difficulty with speech    She reports that this came on more abruptly over the last couple of weeks as well as some voice change its much higher pitched.  Unclear what may have caused this consider stroke.  She has an extremely dry mouth.  In fact the tongue depressor stuck to her tongue on exam.  She says that the dry mouth is chronic and has been going on for years and is not new.  Also consider that it could be part of her worsening neuropathy.      Relevant Orders   MR Brain W Wo Contrast    Dehydration    Push oral fluids today.  I am concerned about the persistent nausea and dry heaves that she is getting.  Plan to refer her to GI.  Consider gastritis, consider gastroparesis, consider gastric mass, she has had her gallbladder removed.  Consider pancreatitis and hepatitis.  Labs ordered today.       Other Visit Diagnoses    Dysphasia    -  Primary   Relevant Orders   Lipase   COMPLETE METABOLIC PANEL WITH GFR   CBC with Differential/Platelet   Cortisol   ACTH   MR Brain W Wo Contrast   Balance problem       Relevant Orders   MR Brain W Wo Contrast   Nausea       Relevant Orders   Lipase   COMPLETE METABOLIC PANEL WITH GFR   CBC with Differential/Platelet   Cortisol   ACTH   Hypotension, unspecified hypotension type       Dry heaves       Left adnexal mass           Dehydration-encouraged her to reduce her furosemide to every other day.  No orders of the defined types were placed in this encounter.   Follow-up: No follow-ups on file.    [name], MD

## 2020-11-24 NOTE — Assessment & Plan Note (Signed)
Push oral fluids today.  I am concerned about the persistent nausea and dry heaves that she is getting.  Plan to refer her to GI.  Consider gastritis, consider gastroparesis, consider gastric mass, she has had her gallbladder removed.  Consider pancreatitis and hepatitis.  Labs ordered today.

## 2020-11-24 NOTE — Assessment & Plan Note (Signed)
Her blood pressure is actually low today.  She has been taking her furosemide daily we discussed taking it every other day she has not had any recent swelling she usually gets swelling in one of her feet when she is volume overloaded.  We may need to even decrease her dose more than that but will start with every other day she already took her medication for today really encouraged her to push oral fluids.  She says she is getting an infusion tomorrow

## 2020-11-25 ENCOUNTER — Telehealth: Payer: Self-pay

## 2020-11-25 DIAGNOSIS — K589 Irritable bowel syndrome without diarrhea: Secondary | ICD-10-CM | POA: Diagnosis not present

## 2020-11-25 DIAGNOSIS — I10 Essential (primary) hypertension: Secondary | ICD-10-CM | POA: Diagnosis not present

## 2020-11-25 DIAGNOSIS — E1165 Type 2 diabetes mellitus with hyperglycemia: Secondary | ICD-10-CM | POA: Diagnosis not present

## 2020-11-25 DIAGNOSIS — Z7951 Long term (current) use of inhaled steroids: Secondary | ICD-10-CM | POA: Diagnosis not present

## 2020-11-25 DIAGNOSIS — D649 Anemia, unspecified: Secondary | ICD-10-CM | POA: Diagnosis not present

## 2020-11-25 DIAGNOSIS — E663 Overweight: Secondary | ICD-10-CM | POA: Diagnosis not present

## 2020-11-25 DIAGNOSIS — I701 Atherosclerosis of renal artery: Secondary | ICD-10-CM | POA: Diagnosis not present

## 2020-11-25 DIAGNOSIS — E785 Hyperlipidemia, unspecified: Secondary | ICD-10-CM | POA: Diagnosis not present

## 2020-11-25 DIAGNOSIS — H3530 Unspecified macular degeneration: Secondary | ICD-10-CM | POA: Diagnosis not present

## 2020-11-25 DIAGNOSIS — I714 Abdominal aortic aneurysm, without rupture: Secondary | ICD-10-CM | POA: Diagnosis not present

## 2020-11-25 DIAGNOSIS — Z7983 Long term (current) use of bisphosphonates: Secondary | ICD-10-CM | POA: Diagnosis not present

## 2020-11-25 DIAGNOSIS — Z7982 Long term (current) use of aspirin: Secondary | ICD-10-CM | POA: Diagnosis not present

## 2020-11-25 DIAGNOSIS — F32A Depression, unspecified: Secondary | ICD-10-CM | POA: Diagnosis not present

## 2020-11-25 DIAGNOSIS — G4733 Obstructive sleep apnea (adult) (pediatric): Secondary | ICD-10-CM | POA: Diagnosis not present

## 2020-11-25 DIAGNOSIS — E039 Hypothyroidism, unspecified: Secondary | ICD-10-CM | POA: Diagnosis not present

## 2020-11-25 DIAGNOSIS — Z7984 Long term (current) use of oral hypoglycemic drugs: Secondary | ICD-10-CM | POA: Diagnosis not present

## 2020-11-25 DIAGNOSIS — E1136 Type 2 diabetes mellitus with diabetic cataract: Secondary | ICD-10-CM | POA: Diagnosis not present

## 2020-11-25 DIAGNOSIS — F17210 Nicotine dependence, cigarettes, uncomplicated: Secondary | ICD-10-CM | POA: Diagnosis not present

## 2020-11-25 DIAGNOSIS — J9611 Chronic respiratory failure with hypoxia: Secondary | ICD-10-CM | POA: Diagnosis not present

## 2020-11-25 DIAGNOSIS — R339 Retention of urine, unspecified: Secondary | ICD-10-CM | POA: Diagnosis not present

## 2020-11-25 DIAGNOSIS — Z9181 History of falling: Secondary | ICD-10-CM | POA: Diagnosis not present

## 2020-11-25 DIAGNOSIS — Z9981 Dependence on supplemental oxygen: Secondary | ICD-10-CM | POA: Diagnosis not present

## 2020-11-25 DIAGNOSIS — Z7902 Long term (current) use of antithrombotics/antiplatelets: Secondary | ICD-10-CM | POA: Diagnosis not present

## 2020-11-25 DIAGNOSIS — G6181 Chronic inflammatory demyelinating polyneuritis: Secondary | ICD-10-CM | POA: Diagnosis not present

## 2020-11-25 DIAGNOSIS — N3000 Acute cystitis without hematuria: Secondary | ICD-10-CM | POA: Diagnosis not present

## 2020-11-25 DIAGNOSIS — Z79899 Other long term (current) drug therapy: Secondary | ICD-10-CM | POA: Diagnosis not present

## 2020-11-25 NOTE — Telephone Encounter (Signed)
We can probably just send them the labs that I ordered yesterday and see if they can draw those.  I definitely want a get the most updated med list.  She did seem a little confused about a couple of her medications.  The order for the AFOs were sent last month.

## 2020-11-25 NOTE — Telephone Encounter (Signed)
Gearldine Bienenstock, OT from [facility] called and wanted to know if it's ok to extend her PT. 1 week 6 due to a new tremor and weakness in her hands.

## 2020-11-25 NOTE — Telephone Encounter (Signed)
Yes, okay to extend PT.

## 2020-11-25 NOTE — Telephone Encounter (Signed)
Spoke to [place] from [facility] regarding extending PT. He wanted to make you're aware of changes they have noticed. He states that Nitika's speech is changing and she complains about her tongue feeling 'tight" . He states he is not sure if this is related to her medication. He also states she complains of her right pinky finger 'tingling."

## 2020-11-25 NOTE — Telephone Encounter (Signed)
Spoke with Levada Dy [phone]) and gave her message from Dr. Madilyn Fireman.  Faxed lab order and order for AFO to [phone] with confirmation received.  She spoke with patient's daughter who also wasn't clear on what medications she is taking, but he is the one that manages her pill box. Levada Dy will go back out to draw blood tomorrow and will go over all medications and figure out exactly what she is taking. She will report back to [place].

## 2020-11-29 ENCOUNTER — Other Ambulatory Visit: Payer: Self-pay

## 2020-11-29 ENCOUNTER — Ambulatory Visit (INDEPENDENT_AMBULATORY_CARE_PROVIDER_SITE_OTHER): Payer: Medicare Other

## 2020-11-29 DIAGNOSIS — R29818 Other symptoms and signs involving the nervous system: Secondary | ICD-10-CM | POA: Diagnosis not present

## 2020-11-29 DIAGNOSIS — R479 Unspecified speech disturbances: Secondary | ICD-10-CM

## 2020-11-29 DIAGNOSIS — R2689 Other abnormalities of gait and mobility: Secondary | ICD-10-CM | POA: Diagnosis not present

## 2020-11-29 DIAGNOSIS — R4702 Dysphasia: Secondary | ICD-10-CM

## 2020-11-29 DIAGNOSIS — R110 Nausea: Secondary | ICD-10-CM | POA: Diagnosis not present

## 2020-11-29 MED ORDER — GADOBUTROL 1 MMOL/ML IV SOLN
8.0000 mL | Freq: Once | INTRAVENOUS | Status: AC | PRN
  Administered 2020-11-29: 8 mL via INTRAVENOUS

## 2020-11-30 DIAGNOSIS — J9611 Chronic respiratory failure with hypoxia: Secondary | ICD-10-CM | POA: Diagnosis not present

## 2020-11-30 DIAGNOSIS — G6181 Chronic inflammatory demyelinating polyneuritis: Secondary | ICD-10-CM | POA: Diagnosis not present

## 2020-11-30 DIAGNOSIS — H3530 Unspecified macular degeneration: Secondary | ICD-10-CM | POA: Diagnosis not present

## 2020-11-30 DIAGNOSIS — I10 Essential (primary) hypertension: Secondary | ICD-10-CM | POA: Diagnosis not present

## 2020-11-30 DIAGNOSIS — N3000 Acute cystitis without hematuria: Secondary | ICD-10-CM | POA: Diagnosis not present

## 2020-11-30 DIAGNOSIS — E1165 Type 2 diabetes mellitus with hyperglycemia: Secondary | ICD-10-CM | POA: Diagnosis not present

## 2020-11-30 DIAGNOSIS — E1136 Type 2 diabetes mellitus with diabetic cataract: Secondary | ICD-10-CM | POA: Diagnosis not present

## 2020-12-02 DIAGNOSIS — G6181 Chronic inflammatory demyelinating polyneuritis: Secondary | ICD-10-CM | POA: Diagnosis not present

## 2020-12-02 DIAGNOSIS — E1165 Type 2 diabetes mellitus with hyperglycemia: Secondary | ICD-10-CM | POA: Diagnosis not present

## 2020-12-02 DIAGNOSIS — E1136 Type 2 diabetes mellitus with diabetic cataract: Secondary | ICD-10-CM | POA: Diagnosis not present

## 2020-12-02 DIAGNOSIS — H3530 Unspecified macular degeneration: Secondary | ICD-10-CM | POA: Diagnosis not present

## 2020-12-02 DIAGNOSIS — I10 Essential (primary) hypertension: Secondary | ICD-10-CM | POA: Diagnosis not present

## 2020-12-02 DIAGNOSIS — J9611 Chronic respiratory failure with hypoxia: Secondary | ICD-10-CM | POA: Diagnosis not present

## 2020-12-02 DIAGNOSIS — N3000 Acute cystitis without hematuria: Secondary | ICD-10-CM | POA: Diagnosis not present

## 2020-12-02 LAB — COMPLETE METABOLIC PANEL WITH GFR
AG Ratio: 0.8 (calc) — ABNORMAL LOW (ref 1.0–2.5)
ALT: 3 U/L — ABNORMAL LOW (ref 6–29)
AST: 10 U/L (ref 10–35)
Albumin: 3.3 g/dL — ABNORMAL LOW (ref 3.6–5.1)
Alkaline phosphatase (APISO): 60 U/L (ref 37–153)
BUN/Creatinine Ratio: 12 (calc) (ref 6–22)
BUN: 14 mg/dL (ref 7–25)
CO2: 26 mmol/L (ref 20–32)
Calcium: 8.3 mg/dL — ABNORMAL LOW (ref 8.6–10.4)
Chloride: 98 mmol/L (ref 98–110)
Creat: 1.21 mg/dL — ABNORMAL HIGH (ref 0.60–0.93)
GFR, Est African American: 50 mL/min/{1.73_m2} — ABNORMAL LOW (ref >=60)
GFR, Est Non African American: 43 mL/min/{1.73_m2} — ABNORMAL LOW (ref >=60)
Globulin: 4.4 g/dL (calc) — ABNORMAL HIGH (ref 1.9–3.7)
Glucose, Bld: 115 mg/dL — ABNORMAL HIGH (ref 65–99)
Potassium: 3.1 mmol/L — ABNORMAL LOW (ref 3.5–5.3)
Sodium: 136 mmol/L (ref 135–146)
Total Bilirubin: 0.7 mg/dL (ref 0.2–1.2)
Total Protein: 7.7 g/dL (ref 6.1–8.1)

## 2020-12-02 LAB — CBC WITH DIFFERENTIAL/PLATELET
Absolute Monocytes: 482 cells/uL (ref 200–950)
Basophils Absolute: 40 cells/uL (ref 0–200)
Basophils Relative: 0.5 %
Eosinophils Absolute: 40 cells/uL (ref 15–500)
Eosinophils Relative: 0.5 %
HCT: 33.2 % — ABNORMAL LOW (ref 35.0–45.0)
Hemoglobin: 10.7 g/dL — ABNORMAL LOW (ref 11.7–15.5)
Lymphs Abs: 1343 cells/uL (ref 850–3900)
MCH: 28.6 pg (ref 27.0–33.0)
MCHC: 32.2 g/dL (ref 32.0–36.0)
MCV: 88.8 fL (ref 80.0–100.0)
MPV: 12.2 fL (ref 7.5–12.5)
Monocytes Relative: 6.1 %
Neutro Abs: 5996 cells/uL (ref 1500–7800)
Neutrophils Relative %: 75.9 %
Platelets: 315 10*3/uL (ref 140–400)
RBC: 3.74 10*6/uL — ABNORMAL LOW (ref 3.80–5.10)
RDW: 17 % — ABNORMAL HIGH (ref 11.0–15.0)
Total Lymphocyte: 17 %
WBC: 7.9 10*3/uL (ref 3.8–10.8)

## 2020-12-02 LAB — ACTH: C206 ACTH: <5 pg/mL — ABNORMAL LOW (ref 6–50)

## 2020-12-02 LAB — LIPASE: Lipase: 22 U/L (ref 7–60)

## 2020-12-02 LAB — CORTISOL: Cortisol, Plasma: 9.3 ug/dL

## 2020-12-03 ENCOUNTER — Other Ambulatory Visit: Payer: Self-pay | Admitting: *Deleted

## 2020-12-03 MED ORDER — POTASSIUM CHLORIDE CRYS ER 20 MEQ PO TBCR
20.0000 meq | EXTENDED_RELEASE_TABLET | Freq: Every day | ORAL | 3 refills | Status: DC
Start: 2020-12-03 — End: 2021-08-16

## 2020-12-07 ENCOUNTER — Telehealth: Payer: Self-pay

## 2020-12-07 DIAGNOSIS — R197 Diarrhea, unspecified: Secondary | ICD-10-CM | POA: Diagnosis not present

## 2020-12-07 DIAGNOSIS — F17210 Nicotine dependence, cigarettes, uncomplicated: Secondary | ICD-10-CM | POA: Diagnosis present

## 2020-12-07 DIAGNOSIS — Z794 Long term (current) use of insulin: Secondary | ICD-10-CM | POA: Diagnosis not present

## 2020-12-07 DIAGNOSIS — R000 Tachycardia, unspecified: Secondary | ICD-10-CM | POA: Diagnosis not present

## 2020-12-07 DIAGNOSIS — E785 Hyperlipidemia, unspecified: Secondary | ICD-10-CM | POA: Diagnosis present

## 2020-12-07 DIAGNOSIS — E538 Deficiency of other specified B group vitamins: Secondary | ICD-10-CM | POA: Diagnosis not present

## 2020-12-07 DIAGNOSIS — E669 Obesity, unspecified: Secondary | ICD-10-CM | POA: Diagnosis present

## 2020-12-07 DIAGNOSIS — Z9049 Acquired absence of other specified parts of digestive tract: Secondary | ICD-10-CM | POA: Diagnosis not present

## 2020-12-07 DIAGNOSIS — R1084 Generalized abdominal pain: Secondary | ICD-10-CM | POA: Diagnosis not present

## 2020-12-07 DIAGNOSIS — R404 Transient alteration of awareness: Secondary | ICD-10-CM | POA: Diagnosis not present

## 2020-12-07 DIAGNOSIS — R112 Nausea with vomiting, unspecified: Secondary | ICD-10-CM | POA: Diagnosis not present

## 2020-12-07 DIAGNOSIS — I701 Atherosclerosis of renal artery: Secondary | ICD-10-CM | POA: Diagnosis present

## 2020-12-07 DIAGNOSIS — R55 Syncope and collapse: Secondary | ICD-10-CM | POA: Diagnosis not present

## 2020-12-07 DIAGNOSIS — I10 Essential (primary) hypertension: Secondary | ICD-10-CM | POA: Diagnosis not present

## 2020-12-07 DIAGNOSIS — J986 Disorders of diaphragm: Secondary | ICD-10-CM | POA: Diagnosis not present

## 2020-12-07 DIAGNOSIS — Z96659 Presence of unspecified artificial knee joint: Secondary | ICD-10-CM | POA: Diagnosis present

## 2020-12-07 DIAGNOSIS — G9341 Metabolic encephalopathy: Secondary | ICD-10-CM | POA: Diagnosis present

## 2020-12-07 DIAGNOSIS — Z6834 Body mass index (BMI) 34.0-34.9, adult: Secondary | ICD-10-CM | POA: Diagnosis not present

## 2020-12-07 DIAGNOSIS — I083 Combined rheumatic disorders of mitral, aortic and tricuspid valves: Secondary | ICD-10-CM | POA: Diagnosis not present

## 2020-12-07 DIAGNOSIS — N3000 Acute cystitis without hematuria: Secondary | ICD-10-CM | POA: Diagnosis not present

## 2020-12-07 DIAGNOSIS — I714 Abdominal aortic aneurysm, without rupture: Secondary | ICD-10-CM | POA: Diagnosis present

## 2020-12-07 DIAGNOSIS — E039 Hypothyroidism, unspecified: Secondary | ICD-10-CM | POA: Diagnosis present

## 2020-12-07 DIAGNOSIS — R0689 Other abnormalities of breathing: Secondary | ICD-10-CM | POA: Diagnosis not present

## 2020-12-07 DIAGNOSIS — E1142 Type 2 diabetes mellitus with diabetic polyneuropathy: Secondary | ICD-10-CM | POA: Diagnosis present

## 2020-12-07 DIAGNOSIS — R410 Disorientation, unspecified: Secondary | ICD-10-CM | POA: Diagnosis not present

## 2020-12-07 DIAGNOSIS — G9340 Encephalopathy, unspecified: Secondary | ICD-10-CM | POA: Diagnosis not present

## 2020-12-07 DIAGNOSIS — R4182 Altered mental status, unspecified: Secondary | ICD-10-CM | POA: Diagnosis not present

## 2020-12-07 DIAGNOSIS — E872 Acidosis: Secondary | ICD-10-CM | POA: Diagnosis present

## 2020-12-07 DIAGNOSIS — N3090 Cystitis, unspecified without hematuria: Secondary | ICD-10-CM | POA: Diagnosis not present

## 2020-12-07 DIAGNOSIS — E876 Hypokalemia: Secondary | ICD-10-CM | POA: Diagnosis not present

## 2020-12-07 DIAGNOSIS — R059 Cough, unspecified: Secondary | ICD-10-CM | POA: Diagnosis not present

## 2020-12-07 DIAGNOSIS — I723 Aneurysm of iliac artery: Secondary | ICD-10-CM | POA: Diagnosis not present

## 2020-12-07 DIAGNOSIS — Z20822 Contact with and (suspected) exposure to covid-19: Secondary | ICD-10-CM | POA: Diagnosis not present

## 2020-12-07 DIAGNOSIS — G4733 Obstructive sleep apnea (adult) (pediatric): Secondary | ICD-10-CM | POA: Diagnosis present

## 2020-12-07 DIAGNOSIS — Z7982 Long term (current) use of aspirin: Secondary | ICD-10-CM | POA: Diagnosis not present

## 2020-12-07 DIAGNOSIS — B961 Klebsiella pneumoniae [K. pneumoniae] as the cause of diseases classified elsewhere: Secondary | ICD-10-CM | POA: Diagnosis present

## 2020-12-07 DIAGNOSIS — I517 Cardiomegaly: Secondary | ICD-10-CM | POA: Diagnosis not present

## 2020-12-07 DIAGNOSIS — R911 Solitary pulmonary nodule: Secondary | ICD-10-CM | POA: Diagnosis not present

## 2020-12-07 DIAGNOSIS — G6181 Chronic inflammatory demyelinating polyneuritis: Secondary | ICD-10-CM | POA: Diagnosis not present

## 2020-12-07 DIAGNOSIS — E860 Dehydration: Secondary | ICD-10-CM | POA: Diagnosis not present

## 2020-12-07 DIAGNOSIS — J439 Emphysema, unspecified: Secondary | ICD-10-CM | POA: Diagnosis not present

## 2020-12-07 DIAGNOSIS — D649 Anemia, unspecified: Secondary | ICD-10-CM | POA: Diagnosis present

## 2020-12-07 DIAGNOSIS — N281 Cyst of kidney, acquired: Secondary | ICD-10-CM | POA: Diagnosis not present

## 2020-12-07 NOTE — Telephone Encounter (Signed)
Noberto Retort called and left a message stating she found [name] on the floor and called EMS. She has been transported to the ED.

## 2020-12-13 DIAGNOSIS — H3530 Unspecified macular degeneration: Secondary | ICD-10-CM | POA: Diagnosis not present

## 2020-12-13 DIAGNOSIS — E1136 Type 2 diabetes mellitus with diabetic cataract: Secondary | ICD-10-CM | POA: Diagnosis not present

## 2020-12-13 DIAGNOSIS — E1165 Type 2 diabetes mellitus with hyperglycemia: Secondary | ICD-10-CM | POA: Diagnosis not present

## 2020-12-13 DIAGNOSIS — G6181 Chronic inflammatory demyelinating polyneuritis: Secondary | ICD-10-CM | POA: Diagnosis not present

## 2020-12-13 DIAGNOSIS — I10 Essential (primary) hypertension: Secondary | ICD-10-CM | POA: Diagnosis not present

## 2020-12-13 DIAGNOSIS — J9611 Chronic respiratory failure with hypoxia: Secondary | ICD-10-CM | POA: Diagnosis not present

## 2020-12-13 DIAGNOSIS — N3000 Acute cystitis without hematuria: Secondary | ICD-10-CM | POA: Diagnosis not present

## 2020-12-14 ENCOUNTER — Telehealth: Payer: Self-pay | Admitting: *Deleted

## 2020-12-15 DIAGNOSIS — E1165 Type 2 diabetes mellitus with hyperglycemia: Secondary | ICD-10-CM | POA: Diagnosis not present

## 2020-12-15 DIAGNOSIS — H3530 Unspecified macular degeneration: Secondary | ICD-10-CM | POA: Diagnosis not present

## 2020-12-15 DIAGNOSIS — E1136 Type 2 diabetes mellitus with diabetic cataract: Secondary | ICD-10-CM | POA: Diagnosis not present

## 2020-12-15 DIAGNOSIS — G6181 Chronic inflammatory demyelinating polyneuritis: Secondary | ICD-10-CM | POA: Diagnosis not present

## 2020-12-15 DIAGNOSIS — N3000 Acute cystitis without hematuria: Secondary | ICD-10-CM | POA: Diagnosis not present

## 2020-12-15 DIAGNOSIS — J9611 Chronic respiratory failure with hypoxia: Secondary | ICD-10-CM | POA: Diagnosis not present

## 2020-12-15 DIAGNOSIS — I10 Essential (primary) hypertension: Secondary | ICD-10-CM | POA: Diagnosis not present

## 2020-12-16 ENCOUNTER — Telehealth: Payer: Self-pay | Admitting: *Deleted

## 2020-12-16 DIAGNOSIS — H3530 Unspecified macular degeneration: Secondary | ICD-10-CM | POA: Diagnosis not present

## 2020-12-16 DIAGNOSIS — E1165 Type 2 diabetes mellitus with hyperglycemia: Secondary | ICD-10-CM | POA: Diagnosis not present

## 2020-12-16 DIAGNOSIS — N3000 Acute cystitis without hematuria: Secondary | ICD-10-CM | POA: Diagnosis not present

## 2020-12-16 DIAGNOSIS — G6181 Chronic inflammatory demyelinating polyneuritis: Secondary | ICD-10-CM | POA: Diagnosis not present

## 2020-12-16 DIAGNOSIS — J9611 Chronic respiratory failure with hypoxia: Secondary | ICD-10-CM | POA: Diagnosis not present

## 2020-12-16 DIAGNOSIS — I10 Essential (primary) hypertension: Secondary | ICD-10-CM | POA: Diagnosis not present

## 2020-12-16 DIAGNOSIS — E1136 Type 2 diabetes mellitus with diabetic cataract: Secondary | ICD-10-CM | POA: Diagnosis not present

## 2020-12-16 NOTE — Telephone Encounter (Signed)
Returning call for the VO for continued OT.  She also asked that a referral or order be placed for her to get AFO's for bilateral foot drop to either [facility].

## 2020-12-17 ENCOUNTER — Telehealth: Payer: Self-pay

## 2020-12-17 DIAGNOSIS — Z7951 Long term (current) use of inhaled steroids: Secondary | ICD-10-CM | POA: Diagnosis not present

## 2020-12-17 DIAGNOSIS — I10 Essential (primary) hypertension: Secondary | ICD-10-CM | POA: Diagnosis not present

## 2020-12-17 DIAGNOSIS — E1165 Type 2 diabetes mellitus with hyperglycemia: Secondary | ICD-10-CM | POA: Diagnosis not present

## 2020-12-17 DIAGNOSIS — Z23 Encounter for immunization: Secondary | ICD-10-CM | POA: Diagnosis not present

## 2020-12-17 DIAGNOSIS — H3530 Unspecified macular degeneration: Secondary | ICD-10-CM | POA: Diagnosis not present

## 2020-12-17 DIAGNOSIS — Z79899 Other long term (current) drug therapy: Secondary | ICD-10-CM | POA: Diagnosis not present

## 2020-12-17 DIAGNOSIS — E1136 Type 2 diabetes mellitus with diabetic cataract: Secondary | ICD-10-CM | POA: Diagnosis not present

## 2020-12-17 DIAGNOSIS — G6181 Chronic inflammatory demyelinating polyneuritis: Secondary | ICD-10-CM | POA: Diagnosis not present

## 2020-12-17 DIAGNOSIS — N3289 Other specified disorders of bladder: Secondary | ICD-10-CM | POA: Diagnosis not present

## 2020-12-17 DIAGNOSIS — J9611 Chronic respiratory failure with hypoxia: Secondary | ICD-10-CM | POA: Diagnosis not present

## 2020-12-17 DIAGNOSIS — R110 Nausea: Secondary | ICD-10-CM | POA: Diagnosis not present

## 2020-12-17 DIAGNOSIS — R112 Nausea with vomiting, unspecified: Secondary | ICD-10-CM | POA: Diagnosis not present

## 2020-12-17 DIAGNOSIS — I214 Non-ST elevation (NSTEMI) myocardial infarction: Secondary | ICD-10-CM | POA: Diagnosis not present

## 2020-12-17 DIAGNOSIS — R531 Weakness: Secondary | ICD-10-CM | POA: Insufficient documentation

## 2020-12-17 DIAGNOSIS — Z7902 Long term (current) use of antithrombotics/antiplatelets: Secondary | ICD-10-CM | POA: Diagnosis not present

## 2020-12-17 DIAGNOSIS — E119 Type 2 diabetes mellitus without complications: Secondary | ICD-10-CM | POA: Diagnosis not present

## 2020-12-17 DIAGNOSIS — R1110 Vomiting, unspecified: Secondary | ICD-10-CM | POA: Diagnosis not present

## 2020-12-17 DIAGNOSIS — Z7982 Long term (current) use of aspirin: Secondary | ICD-10-CM | POA: Diagnosis not present

## 2020-12-17 DIAGNOSIS — N281 Cyst of kidney, acquired: Secondary | ICD-10-CM | POA: Diagnosis not present

## 2020-12-17 DIAGNOSIS — I959 Hypotension, unspecified: Secondary | ICD-10-CM | POA: Diagnosis not present

## 2020-12-17 DIAGNOSIS — J439 Emphysema, unspecified: Secondary | ICD-10-CM | POA: Diagnosis not present

## 2020-12-17 DIAGNOSIS — N3000 Acute cystitis without hematuria: Secondary | ICD-10-CM | POA: Diagnosis not present

## 2020-12-17 DIAGNOSIS — R197 Diarrhea, unspecified: Secondary | ICD-10-CM | POA: Diagnosis not present

## 2020-12-17 DIAGNOSIS — E039 Hypothyroidism, unspecified: Secondary | ICD-10-CM | POA: Diagnosis not present

## 2020-12-17 DIAGNOSIS — Z7984 Long term (current) use of oral hypoglycemic drugs: Secondary | ICD-10-CM | POA: Diagnosis not present

## 2020-12-17 DIAGNOSIS — R109 Unspecified abdominal pain: Secondary | ICD-10-CM | POA: Diagnosis not present

## 2020-12-17 DIAGNOSIS — Z9989 Dependence on other enabling machines and devices: Secondary | ICD-10-CM | POA: Diagnosis not present

## 2020-12-17 DIAGNOSIS — R42 Dizziness and giddiness: Secondary | ICD-10-CM | POA: Diagnosis not present

## 2020-12-17 DIAGNOSIS — F32A Depression, unspecified: Secondary | ICD-10-CM | POA: Diagnosis not present

## 2020-12-17 DIAGNOSIS — Z9049 Acquired absence of other specified parts of digestive tract: Secondary | ICD-10-CM | POA: Diagnosis not present

## 2020-12-17 DIAGNOSIS — R1111 Vomiting without nausea: Secondary | ICD-10-CM | POA: Diagnosis not present

## 2020-12-17 DIAGNOSIS — N3090 Cystitis, unspecified without hematuria: Secondary | ICD-10-CM | POA: Diagnosis not present

## 2020-12-17 DIAGNOSIS — D649 Anemia, unspecified: Secondary | ICD-10-CM | POA: Diagnosis not present

## 2020-12-17 DIAGNOSIS — G4733 Obstructive sleep apnea (adult) (pediatric): Secondary | ICD-10-CM | POA: Diagnosis not present

## 2020-12-17 DIAGNOSIS — E785 Hyperlipidemia, unspecified: Secondary | ICD-10-CM | POA: Diagnosis not present

## 2020-12-17 MED ORDER — ZOLPIDEM TARTRATE 5 MG PO TABS: 2.5000 mg | ORAL_TABLET | Freq: Every day | ORAL | 0 refills | Status: DC

## 2020-12-17 NOTE — Telephone Encounter (Signed)
Please have her skip her Lasix for a day and then restart at half a tab daily.  Have her hold the metoprolol completely for 2 days and if the blood pressures coming up and okay to restart a half a tab twice a day.

## 2020-12-17 NOTE — Telephone Encounter (Signed)
Serita Grammes with [facility] called and reports [name] is not eating a lot mostly drinking water. Her blood pressure is running 80-90/50-60. She reports no dizziness.   She is still having nausea when she is not eating.    She refused home health aid today because she didn't feel like having anyone in the house.

## 2020-12-18 DIAGNOSIS — N3090 Cystitis, unspecified without hematuria: Secondary | ICD-10-CM | POA: Diagnosis not present

## 2020-12-18 DIAGNOSIS — I10 Essential (primary) hypertension: Secondary | ICD-10-CM | POA: Diagnosis not present

## 2020-12-18 DIAGNOSIS — J439 Emphysema, unspecified: Secondary | ICD-10-CM | POA: Diagnosis not present

## 2020-12-18 DIAGNOSIS — Z9989 Dependence on other enabling machines and devices: Secondary | ICD-10-CM | POA: Diagnosis not present

## 2020-12-18 DIAGNOSIS — G6181 Chronic inflammatory demyelinating polyneuritis: Secondary | ICD-10-CM | POA: Diagnosis not present

## 2020-12-18 DIAGNOSIS — R531 Weakness: Secondary | ICD-10-CM | POA: Diagnosis not present

## 2020-12-18 DIAGNOSIS — J9611 Chronic respiratory failure with hypoxia: Secondary | ICD-10-CM | POA: Diagnosis not present

## 2020-12-18 DIAGNOSIS — G4733 Obstructive sleep apnea (adult) (pediatric): Secondary | ICD-10-CM | POA: Diagnosis not present

## 2020-12-19 DIAGNOSIS — R198 Other specified symptoms and signs involving the digestive system and abdomen: Secondary | ICD-10-CM | POA: Diagnosis not present

## 2020-12-19 DIAGNOSIS — G6181 Chronic inflammatory demyelinating polyneuritis: Secondary | ICD-10-CM | POA: Diagnosis not present

## 2020-12-19 DIAGNOSIS — J9611 Chronic respiratory failure with hypoxia: Secondary | ICD-10-CM | POA: Diagnosis not present

## 2020-12-19 DIAGNOSIS — J449 Chronic obstructive pulmonary disease, unspecified: Secondary | ICD-10-CM | POA: Diagnosis not present

## 2020-12-19 DIAGNOSIS — Z9989 Dependence on other enabling machines and devices: Secondary | ICD-10-CM | POA: Diagnosis not present

## 2020-12-19 DIAGNOSIS — J439 Emphysema, unspecified: Secondary | ICD-10-CM | POA: Diagnosis not present

## 2020-12-19 DIAGNOSIS — N3090 Cystitis, unspecified without hematuria: Secondary | ICD-10-CM | POA: Diagnosis not present

## 2020-12-19 DIAGNOSIS — R531 Weakness: Secondary | ICD-10-CM | POA: Diagnosis not present

## 2020-12-19 DIAGNOSIS — I10 Essential (primary) hypertension: Secondary | ICD-10-CM | POA: Diagnosis not present

## 2020-12-19 DIAGNOSIS — G4733 Obstructive sleep apnea (adult) (pediatric): Secondary | ICD-10-CM | POA: Diagnosis not present

## 2020-12-20 ENCOUNTER — Other Ambulatory Visit: Payer: Self-pay | Admitting: Family Medicine

## 2020-12-20 DIAGNOSIS — G6181 Chronic inflammatory demyelinating polyneuritis: Secondary | ICD-10-CM | POA: Diagnosis not present

## 2020-12-20 DIAGNOSIS — J9611 Chronic respiratory failure with hypoxia: Secondary | ICD-10-CM | POA: Diagnosis not present

## 2020-12-20 DIAGNOSIS — N3090 Cystitis, unspecified without hematuria: Secondary | ICD-10-CM | POA: Diagnosis not present

## 2020-12-20 DIAGNOSIS — R531 Weakness: Secondary | ICD-10-CM | POA: Diagnosis not present

## 2020-12-20 DIAGNOSIS — I10 Essential (primary) hypertension: Secondary | ICD-10-CM | POA: Diagnosis not present

## 2020-12-20 DIAGNOSIS — J439 Emphysema, unspecified: Secondary | ICD-10-CM | POA: Diagnosis not present

## 2020-12-20 DIAGNOSIS — Z9989 Dependence on other enabling machines and devices: Secondary | ICD-10-CM | POA: Diagnosis not present

## 2020-12-20 DIAGNOSIS — G4733 Obstructive sleep apnea (adult) (pediatric): Secondary | ICD-10-CM | POA: Diagnosis not present

## 2020-12-20 MED ORDER — FUROSEMIDE 40 MG PO TABS: 40.0000 mg | ORAL_TABLET | ORAL | 0 refills | Status: DC

## 2020-12-20 MED ORDER — METFORMIN HCL 500 MG PO TABS: 1.0000 | ORAL_TABLET | Freq: Two times a day (BID) | ORAL | 0 refills | Status: DC

## 2020-12-20 MED ORDER — METOPROLOL TARTRATE 25 MG PO TABS: 25.0000 mg | ORAL_TABLET | Freq: Two times a day (BID) | ORAL | 2 refills | Status: DC

## 2020-12-20 MED ORDER — GABAPENTIN 100 MG PO CAPS: 100.0000 mg | ORAL_CAPSULE | Freq: Three times a day (TID) | ORAL | 1 refills | Status: DC

## 2020-12-21 DIAGNOSIS — R531 Weakness: Secondary | ICD-10-CM | POA: Diagnosis not present

## 2020-12-21 DIAGNOSIS — G6181 Chronic inflammatory demyelinating polyneuritis: Secondary | ICD-10-CM | POA: Diagnosis not present

## 2020-12-21 DIAGNOSIS — N3090 Cystitis, unspecified without hematuria: Secondary | ICD-10-CM | POA: Diagnosis not present

## 2020-12-21 DIAGNOSIS — E785 Hyperlipidemia, unspecified: Secondary | ICD-10-CM | POA: Diagnosis not present

## 2020-12-21 DIAGNOSIS — I10 Essential (primary) hypertension: Secondary | ICD-10-CM | POA: Diagnosis not present

## 2020-12-21 DIAGNOSIS — G4733 Obstructive sleep apnea (adult) (pediatric): Secondary | ICD-10-CM | POA: Diagnosis not present

## 2020-12-21 DIAGNOSIS — J439 Emphysema, unspecified: Secondary | ICD-10-CM | POA: Diagnosis not present

## 2020-12-21 DIAGNOSIS — Z9989 Dependence on other enabling machines and devices: Secondary | ICD-10-CM | POA: Diagnosis not present

## 2020-12-21 DIAGNOSIS — R42 Dizziness and giddiness: Secondary | ICD-10-CM | POA: Diagnosis not present

## 2020-12-21 DIAGNOSIS — E119 Type 2 diabetes mellitus without complications: Secondary | ICD-10-CM | POA: Diagnosis not present

## 2020-12-21 DIAGNOSIS — J9611 Chronic respiratory failure with hypoxia: Secondary | ICD-10-CM | POA: Diagnosis not present

## 2020-12-21 DIAGNOSIS — Z794 Long term (current) use of insulin: Secondary | ICD-10-CM | POA: Diagnosis not present

## 2020-12-21 NOTE — Telephone Encounter (Signed)
I called and spoke with [name]. They believe she is going to go to rehab after the hospital for 20 days.

## 2020-12-21 NOTE — Telephone Encounter (Signed)
Yes, it looks like she was seen again in the ED on the 13th.  Please let notify home health that I have updated her medication list and will be faxing that over to them today on the forms that they sent to me so that we can adjust in particular her diuretic, metoprolol, Ambien, and gabapentin

## 2020-12-21 NOTE — Telephone Encounter (Signed)
Patient was admitted into the hospital over the weekend.

## 2020-12-22 DIAGNOSIS — M6281 Muscle weakness (generalized): Secondary | ICD-10-CM | POA: Diagnosis not present

## 2020-12-22 DIAGNOSIS — K219 Gastro-esophageal reflux disease without esophagitis: Secondary | ICD-10-CM | POA: Diagnosis not present

## 2020-12-22 DIAGNOSIS — E538 Deficiency of other specified B group vitamins: Secondary | ICD-10-CM | POA: Diagnosis not present

## 2020-12-22 DIAGNOSIS — R279 Unspecified lack of coordination: Secondary | ICD-10-CM | POA: Diagnosis not present

## 2020-12-22 DIAGNOSIS — R2689 Other abnormalities of gait and mobility: Secondary | ICD-10-CM | POA: Diagnosis not present

## 2020-12-22 DIAGNOSIS — R112 Nausea with vomiting, unspecified: Secondary | ICD-10-CM | POA: Diagnosis not present

## 2020-12-22 DIAGNOSIS — R42 Dizziness and giddiness: Secondary | ICD-10-CM | POA: Diagnosis not present

## 2020-12-22 DIAGNOSIS — Z743 Need for continuous supervision: Secondary | ICD-10-CM | POA: Diagnosis not present

## 2020-12-22 DIAGNOSIS — R0902 Hypoxemia: Secondary | ICD-10-CM | POA: Diagnosis not present

## 2020-12-22 DIAGNOSIS — G600 Hereditary motor and sensory neuropathy: Secondary | ICD-10-CM | POA: Diagnosis not present

## 2020-12-22 DIAGNOSIS — G9341 Metabolic encephalopathy: Secondary | ICD-10-CM | POA: Diagnosis not present

## 2020-12-22 DIAGNOSIS — Z9989 Dependence on other enabling machines and devices: Secondary | ICD-10-CM | POA: Diagnosis not present

## 2020-12-22 DIAGNOSIS — E119 Type 2 diabetes mellitus without complications: Secondary | ICD-10-CM | POA: Diagnosis not present

## 2020-12-22 DIAGNOSIS — G4733 Obstructive sleep apnea (adult) (pediatric): Secondary | ICD-10-CM | POA: Diagnosis not present

## 2020-12-22 DIAGNOSIS — J449 Chronic obstructive pulmonary disease, unspecified: Secondary | ICD-10-CM | POA: Diagnosis not present

## 2020-12-22 DIAGNOSIS — J9611 Chronic respiratory failure with hypoxia: Secondary | ICD-10-CM | POA: Diagnosis not present

## 2020-12-22 DIAGNOSIS — R531 Weakness: Secondary | ICD-10-CM | POA: Diagnosis not present

## 2020-12-22 DIAGNOSIS — E039 Hypothyroidism, unspecified: Secondary | ICD-10-CM | POA: Diagnosis not present

## 2020-12-22 DIAGNOSIS — R4702 Dysphasia: Secondary | ICD-10-CM | POA: Diagnosis not present

## 2020-12-22 DIAGNOSIS — E876 Hypokalemia: Secondary | ICD-10-CM | POA: Diagnosis not present

## 2020-12-22 DIAGNOSIS — J439 Emphysema, unspecified: Secondary | ICD-10-CM | POA: Diagnosis not present

## 2020-12-22 DIAGNOSIS — E785 Hyperlipidemia, unspecified: Secondary | ICD-10-CM | POA: Diagnosis not present

## 2020-12-22 DIAGNOSIS — N3090 Cystitis, unspecified without hematuria: Secondary | ICD-10-CM | POA: Diagnosis not present

## 2020-12-22 DIAGNOSIS — E1165 Type 2 diabetes mellitus with hyperglycemia: Secondary | ICD-10-CM | POA: Diagnosis not present

## 2020-12-22 DIAGNOSIS — I2781 Cor pulmonale (chronic): Secondary | ICD-10-CM | POA: Diagnosis not present

## 2020-12-22 DIAGNOSIS — F32A Depression, unspecified: Secondary | ICD-10-CM | POA: Diagnosis not present

## 2020-12-22 DIAGNOSIS — Z794 Long term (current) use of insulin: Secondary | ICD-10-CM | POA: Diagnosis not present

## 2020-12-22 DIAGNOSIS — R269 Unspecified abnormalities of gait and mobility: Secondary | ICD-10-CM | POA: Diagnosis not present

## 2020-12-22 DIAGNOSIS — K582 Mixed irritable bowel syndrome: Secondary | ICD-10-CM | POA: Diagnosis not present

## 2020-12-22 DIAGNOSIS — G6181 Chronic inflammatory demyelinating polyneuritis: Secondary | ICD-10-CM | POA: Diagnosis not present

## 2020-12-22 DIAGNOSIS — I10 Essential (primary) hypertension: Secondary | ICD-10-CM | POA: Diagnosis not present

## 2020-12-22 DIAGNOSIS — D638 Anemia in other chronic diseases classified elsewhere: Secondary | ICD-10-CM | POA: Diagnosis not present

## 2020-12-30 ENCOUNTER — Ambulatory Visit: Payer: Medicare Other | Admitting: Family Medicine

## 2020-12-30 DIAGNOSIS — G6181 Chronic inflammatory demyelinating polyneuritis: Secondary | ICD-10-CM | POA: Diagnosis not present

## 2021-01-04 NOTE — Telephone Encounter (Signed)
error 

## 2021-01-05 DIAGNOSIS — J449 Chronic obstructive pulmonary disease, unspecified: Secondary | ICD-10-CM | POA: Diagnosis not present

## 2021-01-05 DIAGNOSIS — G4730 Sleep apnea, unspecified: Secondary | ICD-10-CM | POA: Diagnosis not present

## 2021-01-05 DIAGNOSIS — G6181 Chronic inflammatory demyelinating polyneuritis: Secondary | ICD-10-CM | POA: Diagnosis not present

## 2021-01-05 DIAGNOSIS — D638 Anemia in other chronic diseases classified elsewhere: Secondary | ICD-10-CM | POA: Diagnosis not present

## 2021-01-05 DIAGNOSIS — Z9181 History of falling: Secondary | ICD-10-CM | POA: Diagnosis not present

## 2021-01-05 DIAGNOSIS — J9611 Chronic respiratory failure with hypoxia: Secondary | ICD-10-CM | POA: Diagnosis not present

## 2021-01-05 DIAGNOSIS — R112 Nausea with vomiting, unspecified: Secondary | ICD-10-CM | POA: Diagnosis not present

## 2021-01-05 DIAGNOSIS — E876 Hypokalemia: Secondary | ICD-10-CM | POA: Diagnosis not present

## 2021-01-05 DIAGNOSIS — E039 Hypothyroidism, unspecified: Secondary | ICD-10-CM | POA: Diagnosis not present

## 2021-01-05 DIAGNOSIS — E1136 Type 2 diabetes mellitus with diabetic cataract: Secondary | ICD-10-CM | POA: Diagnosis not present

## 2021-01-05 DIAGNOSIS — E538 Deficiency of other specified B group vitamins: Secondary | ICD-10-CM | POA: Diagnosis not present

## 2021-01-05 DIAGNOSIS — Z9981 Dependence on supplemental oxygen: Secondary | ICD-10-CM | POA: Diagnosis not present

## 2021-01-05 DIAGNOSIS — I10 Essential (primary) hypertension: Secondary | ICD-10-CM | POA: Diagnosis not present

## 2021-01-05 DIAGNOSIS — E1165 Type 2 diabetes mellitus with hyperglycemia: Secondary | ICD-10-CM | POA: Diagnosis not present

## 2021-01-05 DIAGNOSIS — R339 Retention of urine, unspecified: Secondary | ICD-10-CM | POA: Diagnosis not present

## 2021-01-05 DIAGNOSIS — G600 Hereditary motor and sensory neuropathy: Secondary | ICD-10-CM | POA: Diagnosis not present

## 2021-01-05 DIAGNOSIS — Z7951 Long term (current) use of inhaled steroids: Secondary | ICD-10-CM | POA: Diagnosis not present

## 2021-01-05 DIAGNOSIS — K219 Gastro-esophageal reflux disease without esophagitis: Secondary | ICD-10-CM | POA: Diagnosis not present

## 2021-01-05 DIAGNOSIS — K580 Irritable bowel syndrome with diarrhea: Secondary | ICD-10-CM | POA: Diagnosis not present

## 2021-01-05 DIAGNOSIS — H3530 Unspecified macular degeneration: Secondary | ICD-10-CM | POA: Diagnosis not present

## 2021-01-05 DIAGNOSIS — E7800 Pure hypercholesterolemia, unspecified: Secondary | ICD-10-CM | POA: Diagnosis not present

## 2021-01-05 DIAGNOSIS — F32A Depression, unspecified: Secondary | ICD-10-CM | POA: Diagnosis not present

## 2021-01-06 ENCOUNTER — Inpatient Hospital Stay: Payer: Medicare Other | Admitting: Family Medicine

## 2021-01-10 DIAGNOSIS — K580 Irritable bowel syndrome with diarrhea: Secondary | ICD-10-CM | POA: Diagnosis not present

## 2021-01-10 DIAGNOSIS — J449 Chronic obstructive pulmonary disease, unspecified: Secondary | ICD-10-CM | POA: Diagnosis not present

## 2021-01-10 DIAGNOSIS — G6181 Chronic inflammatory demyelinating polyneuritis: Secondary | ICD-10-CM | POA: Diagnosis not present

## 2021-01-10 DIAGNOSIS — J9611 Chronic respiratory failure with hypoxia: Secondary | ICD-10-CM | POA: Diagnosis not present

## 2021-01-10 DIAGNOSIS — E1165 Type 2 diabetes mellitus with hyperglycemia: Secondary | ICD-10-CM | POA: Diagnosis not present

## 2021-01-10 DIAGNOSIS — I10 Essential (primary) hypertension: Secondary | ICD-10-CM | POA: Diagnosis not present

## 2021-01-11 ENCOUNTER — Other Ambulatory Visit: Payer: Self-pay | Admitting: Family Medicine

## 2021-01-11 ENCOUNTER — Telehealth: Payer: Self-pay | Admitting: *Deleted

## 2021-01-11 DIAGNOSIS — J9611 Chronic respiratory failure with hypoxia: Secondary | ICD-10-CM | POA: Diagnosis not present

## 2021-01-11 DIAGNOSIS — I10 Essential (primary) hypertension: Secondary | ICD-10-CM | POA: Diagnosis not present

## 2021-01-11 DIAGNOSIS — J449 Chronic obstructive pulmonary disease, unspecified: Secondary | ICD-10-CM | POA: Diagnosis not present

## 2021-01-11 DIAGNOSIS — K580 Irritable bowel syndrome with diarrhea: Secondary | ICD-10-CM | POA: Diagnosis not present

## 2021-01-11 DIAGNOSIS — G6181 Chronic inflammatory demyelinating polyneuritis: Secondary | ICD-10-CM | POA: Diagnosis not present

## 2021-01-11 DIAGNOSIS — E1165 Type 2 diabetes mellitus with hyperglycemia: Secondary | ICD-10-CM | POA: Diagnosis not present

## 2021-01-11 MED ORDER — AMBULATORY NON FORMULARY MEDICATION: 0 refills | Status: DC

## 2021-01-11 NOTE — Telephone Encounter (Signed)
LVM for VO for continuation of PT/OT for 2wks x6 and also printed an order for a tub transfer bench to be faxed to [facility] [phone]

## 2021-01-13 ENCOUNTER — Telehealth: Payer: Self-pay | Admitting: *Deleted

## 2021-01-13 DIAGNOSIS — K580 Irritable bowel syndrome with diarrhea: Secondary | ICD-10-CM | POA: Diagnosis not present

## 2021-01-13 DIAGNOSIS — G6181 Chronic inflammatory demyelinating polyneuritis: Secondary | ICD-10-CM | POA: Diagnosis not present

## 2021-01-13 DIAGNOSIS — E1165 Type 2 diabetes mellitus with hyperglycemia: Secondary | ICD-10-CM | POA: Diagnosis not present

## 2021-01-13 DIAGNOSIS — I10 Essential (primary) hypertension: Secondary | ICD-10-CM | POA: Diagnosis not present

## 2021-01-13 DIAGNOSIS — J449 Chronic obstructive pulmonary disease, unspecified: Secondary | ICD-10-CM | POA: Diagnosis not present

## 2021-01-13 DIAGNOSIS — J9611 Chronic respiratory failure with hypoxia: Secondary | ICD-10-CM | POA: Diagnosis not present

## 2021-01-13 NOTE — Telephone Encounter (Signed)
VO given for physical therapy for 1 week/1, 2 weeks/3, 1 week/4.

## 2021-01-14 ENCOUNTER — Other Ambulatory Visit: Payer: Self-pay | Admitting: *Deleted

## 2021-01-14 DIAGNOSIS — J449 Chronic obstructive pulmonary disease, unspecified: Secondary | ICD-10-CM | POA: Diagnosis not present

## 2021-01-14 DIAGNOSIS — J9611 Chronic respiratory failure with hypoxia: Secondary | ICD-10-CM | POA: Diagnosis not present

## 2021-01-14 DIAGNOSIS — G6181 Chronic inflammatory demyelinating polyneuritis: Secondary | ICD-10-CM | POA: Diagnosis not present

## 2021-01-14 DIAGNOSIS — I10 Essential (primary) hypertension: Secondary | ICD-10-CM | POA: Diagnosis not present

## 2021-01-14 DIAGNOSIS — E1165 Type 2 diabetes mellitus with hyperglycemia: Secondary | ICD-10-CM | POA: Diagnosis not present

## 2021-01-14 DIAGNOSIS — K580 Irritable bowel syndrome with diarrhea: Secondary | ICD-10-CM | POA: Diagnosis not present

## 2021-01-14 MED ORDER — ZOLPIDEM TARTRATE 5 MG PO TABS: 2.5000 mg | ORAL_TABLET | Freq: Every day | ORAL | 0 refills | Status: DC

## 2021-01-14 NOTE — Telephone Encounter (Signed)
Pt left vm stating that her Ambien refill was denied.  I can't find anywhere stating why it was denied.  Last fill was 10/15/20 for #90.

## 2021-01-14 NOTE — Telephone Encounter (Signed)
I sent in a new script for half a tab nightly. We need to start decreasing some of her really sedating medication that are affecting her breathing.

## 2021-01-17 DIAGNOSIS — E1165 Type 2 diabetes mellitus with hyperglycemia: Secondary | ICD-10-CM | POA: Diagnosis not present

## 2021-01-17 DIAGNOSIS — I10 Essential (primary) hypertension: Secondary | ICD-10-CM | POA: Diagnosis not present

## 2021-01-17 DIAGNOSIS — J9611 Chronic respiratory failure with hypoxia: Secondary | ICD-10-CM | POA: Diagnosis not present

## 2021-01-17 DIAGNOSIS — G6181 Chronic inflammatory demyelinating polyneuritis: Secondary | ICD-10-CM | POA: Diagnosis not present

## 2021-01-17 DIAGNOSIS — J449 Chronic obstructive pulmonary disease, unspecified: Secondary | ICD-10-CM | POA: Diagnosis not present

## 2021-01-17 DIAGNOSIS — K580 Irritable bowel syndrome with diarrhea: Secondary | ICD-10-CM | POA: Diagnosis not present

## 2021-01-18 DIAGNOSIS — E1165 Type 2 diabetes mellitus with hyperglycemia: Secondary | ICD-10-CM | POA: Diagnosis not present

## 2021-01-18 DIAGNOSIS — J9611 Chronic respiratory failure with hypoxia: Secondary | ICD-10-CM | POA: Diagnosis not present

## 2021-01-18 DIAGNOSIS — J449 Chronic obstructive pulmonary disease, unspecified: Secondary | ICD-10-CM | POA: Diagnosis not present

## 2021-01-18 DIAGNOSIS — K580 Irritable bowel syndrome with diarrhea: Secondary | ICD-10-CM | POA: Diagnosis not present

## 2021-01-18 DIAGNOSIS — I10 Essential (primary) hypertension: Secondary | ICD-10-CM | POA: Diagnosis not present

## 2021-01-18 DIAGNOSIS — G6181 Chronic inflammatory demyelinating polyneuritis: Secondary | ICD-10-CM | POA: Diagnosis not present

## 2021-01-20 DIAGNOSIS — I10 Essential (primary) hypertension: Secondary | ICD-10-CM | POA: Diagnosis not present

## 2021-01-20 DIAGNOSIS — K580 Irritable bowel syndrome with diarrhea: Secondary | ICD-10-CM | POA: Diagnosis not present

## 2021-01-20 DIAGNOSIS — J449 Chronic obstructive pulmonary disease, unspecified: Secondary | ICD-10-CM | POA: Diagnosis not present

## 2021-01-20 DIAGNOSIS — G6181 Chronic inflammatory demyelinating polyneuritis: Secondary | ICD-10-CM | POA: Diagnosis not present

## 2021-01-20 DIAGNOSIS — J9611 Chronic respiratory failure with hypoxia: Secondary | ICD-10-CM | POA: Diagnosis not present

## 2021-01-20 DIAGNOSIS — E1165 Type 2 diabetes mellitus with hyperglycemia: Secondary | ICD-10-CM | POA: Diagnosis not present

## 2021-01-21 ENCOUNTER — Other Ambulatory Visit: Payer: Self-pay | Admitting: Family Medicine

## 2021-01-24 DIAGNOSIS — E1165 Type 2 diabetes mellitus with hyperglycemia: Secondary | ICD-10-CM | POA: Diagnosis not present

## 2021-01-24 DIAGNOSIS — I10 Essential (primary) hypertension: Secondary | ICD-10-CM | POA: Diagnosis not present

## 2021-01-24 DIAGNOSIS — K580 Irritable bowel syndrome with diarrhea: Secondary | ICD-10-CM | POA: Diagnosis not present

## 2021-01-24 DIAGNOSIS — J9611 Chronic respiratory failure with hypoxia: Secondary | ICD-10-CM | POA: Diagnosis not present

## 2021-01-24 DIAGNOSIS — J449 Chronic obstructive pulmonary disease, unspecified: Secondary | ICD-10-CM | POA: Diagnosis not present

## 2021-01-24 DIAGNOSIS — G6181 Chronic inflammatory demyelinating polyneuritis: Secondary | ICD-10-CM | POA: Diagnosis not present

## 2021-01-25 DIAGNOSIS — I10 Essential (primary) hypertension: Secondary | ICD-10-CM | POA: Diagnosis not present

## 2021-01-25 DIAGNOSIS — G6181 Chronic inflammatory demyelinating polyneuritis: Secondary | ICD-10-CM | POA: Diagnosis not present

## 2021-01-25 DIAGNOSIS — E1165 Type 2 diabetes mellitus with hyperglycemia: Secondary | ICD-10-CM | POA: Diagnosis not present

## 2021-01-25 DIAGNOSIS — K580 Irritable bowel syndrome with diarrhea: Secondary | ICD-10-CM | POA: Diagnosis not present

## 2021-01-25 DIAGNOSIS — J449 Chronic obstructive pulmonary disease, unspecified: Secondary | ICD-10-CM | POA: Diagnosis not present

## 2021-01-25 DIAGNOSIS — J9611 Chronic respiratory failure with hypoxia: Secondary | ICD-10-CM | POA: Diagnosis not present

## 2021-01-26 DIAGNOSIS — J9611 Chronic respiratory failure with hypoxia: Secondary | ICD-10-CM | POA: Diagnosis not present

## 2021-01-26 DIAGNOSIS — K580 Irritable bowel syndrome with diarrhea: Secondary | ICD-10-CM | POA: Diagnosis not present

## 2021-01-26 DIAGNOSIS — G6181 Chronic inflammatory demyelinating polyneuritis: Secondary | ICD-10-CM | POA: Diagnosis not present

## 2021-01-26 DIAGNOSIS — E1165 Type 2 diabetes mellitus with hyperglycemia: Secondary | ICD-10-CM | POA: Diagnosis not present

## 2021-01-26 DIAGNOSIS — J449 Chronic obstructive pulmonary disease, unspecified: Secondary | ICD-10-CM | POA: Diagnosis not present

## 2021-01-26 DIAGNOSIS — I10 Essential (primary) hypertension: Secondary | ICD-10-CM | POA: Diagnosis not present

## 2021-01-31 ENCOUNTER — Telehealth: Payer: Self-pay | Admitting: Family Medicine

## 2021-01-31 DIAGNOSIS — I10 Essential (primary) hypertension: Secondary | ICD-10-CM | POA: Diagnosis not present

## 2021-01-31 DIAGNOSIS — E1165 Type 2 diabetes mellitus with hyperglycemia: Secondary | ICD-10-CM | POA: Diagnosis not present

## 2021-01-31 DIAGNOSIS — J9611 Chronic respiratory failure with hypoxia: Secondary | ICD-10-CM | POA: Diagnosis not present

## 2021-01-31 DIAGNOSIS — G6181 Chronic inflammatory demyelinating polyneuritis: Secondary | ICD-10-CM | POA: Diagnosis not present

## 2021-01-31 DIAGNOSIS — J449 Chronic obstructive pulmonary disease, unspecified: Secondary | ICD-10-CM | POA: Diagnosis not present

## 2021-01-31 DIAGNOSIS — K580 Irritable bowel syndrome with diarrhea: Secondary | ICD-10-CM | POA: Diagnosis not present

## 2021-01-31 NOTE — Telephone Encounter (Signed)
OK 

## 2021-01-31 NOTE — Telephone Encounter (Signed)
Gearldine Bienenstock with [facility] called and left a Voice Mail Re: [name] and just wanted to make provider aware that patient missed their 2nd appt because they were not feeling well after infusion.  Patient did have first appt and missed the 2nd appt

## 2021-02-01 DIAGNOSIS — K580 Irritable bowel syndrome with diarrhea: Secondary | ICD-10-CM | POA: Diagnosis not present

## 2021-02-01 DIAGNOSIS — E1165 Type 2 diabetes mellitus with hyperglycemia: Secondary | ICD-10-CM | POA: Diagnosis not present

## 2021-02-01 DIAGNOSIS — I10 Essential (primary) hypertension: Secondary | ICD-10-CM | POA: Diagnosis not present

## 2021-02-01 DIAGNOSIS — J9611 Chronic respiratory failure with hypoxia: Secondary | ICD-10-CM | POA: Diagnosis not present

## 2021-02-01 DIAGNOSIS — J449 Chronic obstructive pulmonary disease, unspecified: Secondary | ICD-10-CM | POA: Diagnosis not present

## 2021-02-01 DIAGNOSIS — G6181 Chronic inflammatory demyelinating polyneuritis: Secondary | ICD-10-CM | POA: Diagnosis not present

## 2021-02-02 ENCOUNTER — Telehealth: Payer: Self-pay | Admitting: *Deleted

## 2021-02-02 ENCOUNTER — Other Ambulatory Visit: Payer: Self-pay | Admitting: *Deleted

## 2021-02-02 DIAGNOSIS — E1165 Type 2 diabetes mellitus with hyperglycemia: Secondary | ICD-10-CM | POA: Diagnosis not present

## 2021-02-02 DIAGNOSIS — K580 Irritable bowel syndrome with diarrhea: Secondary | ICD-10-CM | POA: Diagnosis not present

## 2021-02-02 DIAGNOSIS — J449 Chronic obstructive pulmonary disease, unspecified: Secondary | ICD-10-CM | POA: Diagnosis not present

## 2021-02-02 DIAGNOSIS — J9611 Chronic respiratory failure with hypoxia: Secondary | ICD-10-CM | POA: Diagnosis not present

## 2021-02-02 DIAGNOSIS — G6181 Chronic inflammatory demyelinating polyneuritis: Secondary | ICD-10-CM | POA: Diagnosis not present

## 2021-02-02 DIAGNOSIS — I10 Essential (primary) hypertension: Secondary | ICD-10-CM | POA: Diagnosis not present

## 2021-02-02 MED ORDER — ATORVASTATIN CALCIUM 80 MG PO TABS: 80.0000 mg | ORAL_TABLET | Freq: Every day | ORAL | 0 refills | Status: DC

## 2021-02-02 NOTE — Telephone Encounter (Signed)
No answer or v/m 

## 2021-02-03 DIAGNOSIS — K580 Irritable bowel syndrome with diarrhea: Secondary | ICD-10-CM | POA: Diagnosis not present

## 2021-02-03 DIAGNOSIS — E1165 Type 2 diabetes mellitus with hyperglycemia: Secondary | ICD-10-CM | POA: Diagnosis not present

## 2021-02-03 DIAGNOSIS — J9611 Chronic respiratory failure with hypoxia: Secondary | ICD-10-CM | POA: Diagnosis not present

## 2021-02-03 DIAGNOSIS — I10 Essential (primary) hypertension: Secondary | ICD-10-CM | POA: Diagnosis not present

## 2021-02-03 DIAGNOSIS — J449 Chronic obstructive pulmonary disease, unspecified: Secondary | ICD-10-CM | POA: Diagnosis not present

## 2021-02-03 DIAGNOSIS — G6181 Chronic inflammatory demyelinating polyneuritis: Secondary | ICD-10-CM | POA: Diagnosis not present

## 2021-02-04 ENCOUNTER — Other Ambulatory Visit: Payer: Self-pay | Admitting: Family Medicine

## 2021-02-04 DIAGNOSIS — E876 Hypokalemia: Secondary | ICD-10-CM | POA: Diagnosis not present

## 2021-02-04 DIAGNOSIS — H3530 Unspecified macular degeneration: Secondary | ICD-10-CM | POA: Diagnosis not present

## 2021-02-04 DIAGNOSIS — E7800 Pure hypercholesterolemia, unspecified: Secondary | ICD-10-CM | POA: Diagnosis not present

## 2021-02-04 DIAGNOSIS — E039 Hypothyroidism, unspecified: Secondary | ICD-10-CM | POA: Diagnosis not present

## 2021-02-04 DIAGNOSIS — Z9981 Dependence on supplemental oxygen: Secondary | ICD-10-CM | POA: Diagnosis not present

## 2021-02-04 DIAGNOSIS — Z9181 History of falling: Secondary | ICD-10-CM | POA: Diagnosis not present

## 2021-02-04 DIAGNOSIS — E1136 Type 2 diabetes mellitus with diabetic cataract: Secondary | ICD-10-CM | POA: Diagnosis not present

## 2021-02-04 DIAGNOSIS — D638 Anemia in other chronic diseases classified elsewhere: Secondary | ICD-10-CM | POA: Diagnosis not present

## 2021-02-04 DIAGNOSIS — E1165 Type 2 diabetes mellitus with hyperglycemia: Secondary | ICD-10-CM | POA: Diagnosis not present

## 2021-02-04 DIAGNOSIS — G4730 Sleep apnea, unspecified: Secondary | ICD-10-CM | POA: Diagnosis not present

## 2021-02-04 DIAGNOSIS — E538 Deficiency of other specified B group vitamins: Secondary | ICD-10-CM | POA: Diagnosis not present

## 2021-02-04 DIAGNOSIS — I10 Essential (primary) hypertension: Secondary | ICD-10-CM | POA: Diagnosis not present

## 2021-02-04 DIAGNOSIS — J449 Chronic obstructive pulmonary disease, unspecified: Secondary | ICD-10-CM | POA: Diagnosis not present

## 2021-02-04 DIAGNOSIS — F32A Depression, unspecified: Secondary | ICD-10-CM | POA: Diagnosis not present

## 2021-02-04 DIAGNOSIS — K219 Gastro-esophageal reflux disease without esophagitis: Secondary | ICD-10-CM | POA: Diagnosis not present

## 2021-02-04 DIAGNOSIS — G6181 Chronic inflammatory demyelinating polyneuritis: Secondary | ICD-10-CM | POA: Diagnosis not present

## 2021-02-04 DIAGNOSIS — K580 Irritable bowel syndrome with diarrhea: Secondary | ICD-10-CM | POA: Diagnosis not present

## 2021-02-04 DIAGNOSIS — Z7951 Long term (current) use of inhaled steroids: Secondary | ICD-10-CM | POA: Diagnosis not present

## 2021-02-04 DIAGNOSIS — R112 Nausea with vomiting, unspecified: Secondary | ICD-10-CM | POA: Diagnosis not present

## 2021-02-04 DIAGNOSIS — R339 Retention of urine, unspecified: Secondary | ICD-10-CM | POA: Diagnosis not present

## 2021-02-04 DIAGNOSIS — J9611 Chronic respiratory failure with hypoxia: Secondary | ICD-10-CM | POA: Diagnosis not present

## 2021-02-04 DIAGNOSIS — G600 Hereditary motor and sensory neuropathy: Secondary | ICD-10-CM | POA: Diagnosis not present

## 2021-02-08 DIAGNOSIS — I10 Essential (primary) hypertension: Secondary | ICD-10-CM | POA: Diagnosis not present

## 2021-02-08 DIAGNOSIS — J449 Chronic obstructive pulmonary disease, unspecified: Secondary | ICD-10-CM | POA: Diagnosis not present

## 2021-02-08 DIAGNOSIS — E1165 Type 2 diabetes mellitus with hyperglycemia: Secondary | ICD-10-CM | POA: Diagnosis not present

## 2021-02-08 DIAGNOSIS — Z20822 Contact with and (suspected) exposure to covid-19: Secondary | ICD-10-CM | POA: Diagnosis not present

## 2021-02-08 DIAGNOSIS — J9611 Chronic respiratory failure with hypoxia: Secondary | ICD-10-CM | POA: Diagnosis not present

## 2021-02-08 DIAGNOSIS — G6181 Chronic inflammatory demyelinating polyneuritis: Secondary | ICD-10-CM | POA: Diagnosis not present

## 2021-02-08 DIAGNOSIS — K580 Irritable bowel syndrome with diarrhea: Secondary | ICD-10-CM | POA: Diagnosis not present

## 2021-02-09 DIAGNOSIS — J9611 Chronic respiratory failure with hypoxia: Secondary | ICD-10-CM | POA: Diagnosis not present

## 2021-02-09 DIAGNOSIS — G6181 Chronic inflammatory demyelinating polyneuritis: Secondary | ICD-10-CM | POA: Diagnosis not present

## 2021-02-09 DIAGNOSIS — J449 Chronic obstructive pulmonary disease, unspecified: Secondary | ICD-10-CM | POA: Diagnosis not present

## 2021-02-09 DIAGNOSIS — E1165 Type 2 diabetes mellitus with hyperglycemia: Secondary | ICD-10-CM | POA: Diagnosis not present

## 2021-02-09 DIAGNOSIS — K580 Irritable bowel syndrome with diarrhea: Secondary | ICD-10-CM | POA: Diagnosis not present

## 2021-02-09 DIAGNOSIS — I10 Essential (primary) hypertension: Secondary | ICD-10-CM | POA: Diagnosis not present

## 2021-02-10 ENCOUNTER — Ambulatory Visit (INDEPENDENT_AMBULATORY_CARE_PROVIDER_SITE_OTHER): Payer: Medicare Other | Admitting: Family Medicine

## 2021-02-10 ENCOUNTER — Other Ambulatory Visit: Payer: Self-pay

## 2021-02-10 ENCOUNTER — Encounter: Payer: Self-pay | Admitting: Family Medicine

## 2021-02-10 VITALS — BP 83/55 | HR 88

## 2021-02-10 DIAGNOSIS — I959 Hypotension, unspecified: Secondary | ICD-10-CM

## 2021-02-10 DIAGNOSIS — M21372 Foot drop, left foot: Secondary | ICD-10-CM | POA: Diagnosis not present

## 2021-02-10 DIAGNOSIS — E860 Dehydration: Secondary | ICD-10-CM | POA: Diagnosis not present

## 2021-02-10 DIAGNOSIS — J449 Chronic obstructive pulmonary disease, unspecified: Secondary | ICD-10-CM | POA: Diagnosis not present

## 2021-02-10 DIAGNOSIS — N3000 Acute cystitis without hematuria: Secondary | ICD-10-CM

## 2021-02-10 DIAGNOSIS — E1140 Type 2 diabetes mellitus with diabetic neuropathy, unspecified: Secondary | ICD-10-CM | POA: Diagnosis not present

## 2021-02-10 DIAGNOSIS — E039 Hypothyroidism, unspecified: Secondary | ICD-10-CM | POA: Diagnosis not present

## 2021-02-10 DIAGNOSIS — N1832 Chronic kidney disease, stage 3b: Secondary | ICD-10-CM

## 2021-02-10 LAB — POCT GLYCOSYLATED HEMOGLOBIN (HGB A1C): Hemoglobin A1C: 6.2 % — AB (ref 4.0–5.6)

## 2021-02-10 NOTE — Assessment & Plan Note (Signed)
Blood pressure is still low and she is off of all blood pressure medications except for her Lasix every other day.  She says on the days that she does not take it she does notice that she gets a little bit more swelling in her ankles but it is mild and tolerable.  She really reports that she is pushing her fluids regularly now.

## 2021-02-10 NOTE — Progress Notes (Addendum)
Established Patient Office Visit  Subjective:  Patient ID: [name], female    DOB: Apr 27, 1946  Age: 75 y.o. MRN: [id number]  CC:  Chief Complaint  Patient presents with   Hospitalization Follow-up    HPI [name] presents for hospital follow-up.  She was seen back in the emergency department again in May for a urinary tract infection.  This was the second time in a very short period of time.  She never really experienced any dysuria she was mostly having nausea vomiting and weakness.  She gets frequent nausea.  And still has that but has not vomited recently.  She says since her ED visit she has been very diligent about drinking fluid she has a big cup that she now fills up twice a day and she tries to make sure that she drinks it very consistently.  Blood pressures have still been running pretty low at home she takes her Lasix now every other day and she is not on any additional blood pressure pills.  She does not remember if that today was the day that she took it or not she just takes what is in her pillbox.  Follow-up COPD-she says that the PT and OT they have been coming out twice a week have been monitoring her oxygen carefully.  In fact it has not dropped below 90 in over a month and so she is not been wearing her oxygen she is actually been doing really well without it.  We saw we have also removed some of her more sedating medications which I think have been helpful as well.  The PT and OT have also recommended customized braces for her legs she has left foot drop and she will need a referral for that.  Hypothyroidism - Taking medication regularly in the AM away from food and vitamins, etc. No recent change to skin, hair, or energy levels.  Diabetes - no hypoglycemic events. No wounds or sores that are not healing well. No increased thirst or urination. Checking glucose at home. Taking medications as prescribed without any side effects. Doing well of her lasix and now  only taking one metformin daily.      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   Spinal stenosis, lumbar region, without neurogenic claudication    Thoracic spondylosis without myelopathy    Thyroid disease    hypo   Tobacco dependence    Unspecified hereditary and idiopathic peripheral neuropathy     Past Surgical History:  Procedure Laterality Date   ANKLE RECONSTRUCTION     left ankle, plate and pins    bilateral L3 medial branch block     CHOLECYSTECTOMY     fibroid tumor removal  fluoroscopic L5 dorsal medial branch bloc     RENAL ARTERY STENT  11/28/2010   left   REPLACEMENT TOTAL KNEE      Family History  Problem Relation Age of Onset   Stroke Mother    Heart disease Father 26       heart attack   Hypertension Father    Cancer Father        lung   Diabetes Paternal Aunt        insulin dependent    Social History   Socioeconomic History   Marital status: Widowed    Spouse name: Not on file   Number of children: 2   Years of education: 67   Highest education level: Some college, no degree  Occupational History   Not on file  Tobacco Use   Smoking status: Every Day    Packs/day: 0.50    Years: 49.00    Pack years: 24.50    Types: Cigarettes   Smokeless tobacco: Never  Vaping Use   Vaping Use: Never used  Substance and Sexual Activity   Alcohol use: No     Alcohol/week: 0.0 standard drinks   Drug use: No   Sexual activity: Not Currently  Other Topics Concern   Not on file  Social History Narrative   Lives alone but her daughter lives close by and helps when she can. She has a cat. She is not able to read any more due to macular degeneration but she does listen to audio books.    Social Determinants of Health   Financial Resource Strain: Low Risk    Difficulty of Paying Living Expenses: Not hard at all  Food Insecurity: No Food Insecurity   Worried About Charity fundraiser in the Last Year: Never true   [place] in the Last Year: Never true  Transportation Needs: No Transportation Needs   Lack of Transportation (Medical): No   Lack of Transportation (Non-Medical): No  Physical Activity: Sufficiently Active   Days of Exercise per Week: 4 days   Minutes of Exercise per Session: 60 min  Stress: No Stress Concern Present   Feeling of Stress : Not at all  Social Connections: Socially Isolated   Frequency of Communication with Friends and Family: More than three times a week   Frequency of Social Gatherings with Friends and Family: Twice a week   Attends Religious Services: Never   Marine scientist or Organizations: Patient refused   Attends Archivist Meetings: Never   Marital Status: Widowed  Human resources officer Violence: Not At Risk   Fear of Current or Ex-Partner: No   Emotionally Abused: No   Physically Abused: No   Sexually Abused: No    Outpatient Medications Prior to Visit  Medication Sig Dispense Refill   albuterol (ACCUNEB) 1.25 MG/3ML nebulizer solution Take 3 mLs (1.25 mg total) by nebulization every 6 (six) hours as needed for wheezing or shortness of breath. 75 mL 12   AMBULATORY NON FORMULARY MEDICATION Medication Name: Nebulizer machine with equipment.  Diagnosis COPD.  Patient is primarily homebound.  Is faxed to [facility]. She is an established patient. 1 Units 0   AMBULATORY NON FORMULARY  MEDICATION Medication Name: Tub Transfer Bench 1 each 0   aspirin EC 81 MG tablet Take 81 mg by mouth daily.     atorvastatin (LIPITOR) 80 MG tablet Take 1 tablet (80 mg total) by mouth at bedtime. 90 tablet 0   blood glucose  meter kit and supplies KIT Dispense based on patient and insurance preference. Use up to four times daily as directed Dx E11.40 1 each 0   calcium carbonate (OS-CAL) 600 MG tablet Take by mouth.     clopidogrel (PLAVIX) 75 MG tablet TAKE ONE TABLET BY MOUTH EVERY DAY 90 tablet 11   dicyclomine (BENTYL) 20 MG tablet TAKE ONE TABLET BY MOUTH THREE TIMES DAILY AS NEEDED FOR SPASMS 90 tablet 2   FLUoxetine (PROZAC) 40 MG capsule Take 2 capsules (80 mg total) by mouth daily. 180 capsule 2   GAMUNEX-C 20 GM/200ML SOLN      hydrocortisone (ANUSOL-HC) 2.5 % rectal cream Place rectally 2 (two) times daily.     Lancets MISC by Does not apply route.     lansoprazole (PREVACID) 30 MG capsule Take 1 capsule (30 mg total) by mouth daily at 12 noon. 90 capsule 0   levalbuterol (XOPENEX) 0.63 MG/3ML nebulizer solution Take 3 mLs (0.63 mg total) by nebulization every 8 (eight) hours as needed for wheezing or shortness of breath. 60 mL 3   meclizine (ANTIVERT) 25 MG tablet Take 1 tablet (25 mg total) by mouth 3 (three) times daily as needed for dizziness. 30 tablet 6   Multiple Vitamins-Minerals (PRESERVISION AREDS 2) CAPS      mycophenolate (CELLCEPT) 500 MG tablet Take by mouth.  5   nitroGLYCERIN (NITROSTAT) 0.4 MG SL tablet Place 1 tablet (0.4 mg total) under the tongue every 5 (five) minutes as needed. 10 tablet 2   potassium chloride SA (KLOR-CON) 20 MEQ tablet Take 1 tablet (20 mEq total) by mouth daily. 30 tablet 3   PRODIGY NO CODING BLOOD GLUC test strip USE AS DIRECTED 100 each 0   promethazine (PHENERGAN) 25 MG tablet Take 1 tablet (25 mg total) by mouth every 8 (eight) hours as needed for nausea or vomiting. 20 tablet 0   alendronate (FOSAMAX) 70 MG tablet TAKE 1 TABLET BY MOUTH  EVERY 7 DAYS. TAKE IN THE MORNING WITH A FULL GLASS OF WATER ON AN EMPTY STOMACH. DO NOT TAKE OTHER FOOD OR DRINK OR LIE DOWN 12 tablet 1   AMBULATORY NON FORMULARY MEDICATION Medication Name: Bilateral AFOs for foot drop 2 Units 0   diphenoxylate-atropine (LOMOTIL) 2.5-0.025 MG tablet TAKE ONE OR TWO TABLETS BY MOUTH TWICE DAILY AS NEEDED FOR DIARRHEA OR LOOSE STOOLS 60 tablet 5   furosemide (LASIX) 40 MG tablet Take 1 tablet (40 mg total) by mouth every other day. 1 tablet 0   gabapentin (NEURONTIN) 100 MG capsule Take 1 capsule (100 mg total) by mouth 3 (three) times daily. 2 at bedtime. 150 capsule 1   levothyroxine (SYNTHROID) 125 MCG tablet Take 1 tablet (125 mcg total) by mouth daily. 30 tablet 2   lidocaine (XYLOCAINE) 5 % ointment Apply 1 application topically daily. 2 hours before skin procedure. 35.44 g 11   metFORMIN (GLUCOPHAGE) 500 MG tablet Take 1 tablet (500 mg total) by mouth 2 (two) times daily with a meal. 1 tablet 0   metoprolol tartrate (LOPRESSOR) 25 MG tablet Take 1 tablet (25 mg total) by mouth 2 (two) times daily. 60 tablet 2   TRELEGY ELLIPTA 100-62.5-25 MCG/INH AEPB Inhale 1 puff into the lungs every morning. 60 each 5   zolpidem (AMBIEN) 5 MG tablet Take 0.5 tablets (2.5 mg total) by mouth at bedtime. 45 tablet 0   No facility-administered medications prior to visit.    Allergies  Allergen Reactions   Varenicline Nausea And  Vomiting    ROS Review of Systems    Objective:    Physical Exam  BP (!) 83/55   Pulse 88   SpO2 100%  Wt Readings from Last 3 Encounters:  10/22/20 184 lb (83.5 kg)  09/17/20 184 lb 0.6 oz (83.5 kg)  05/06/20 208 lb (94.3 kg)     Health Maintenance Due  Topic Date Due   Zoster Vaccines- Shingrix (1 of 2) Never done   URINE MICROALBUMIN  12/05/2017   COVID-19 Vaccine (2 - Janssen risk series) 11/28/2019   INFLUENZA VACCINE  03/07/2021   OPHTHALMOLOGY EXAM  03/10/2021    There are no preventive care reminders to display for  this patient.  Lab Results  Component Value Date   TSH 0.70 02/10/2021   Lab Results  Component Value Date   WBC 7.9 11/29/2020   HGB 10.7 (L) 11/29/2020   HCT 33.2 (L) 11/29/2020   MCV 88.8 11/29/2020   PLT 315 11/29/2020   Lab Results  Component Value Date   NA 136 02/10/2021   K 4.1 02/10/2021   CO2 27 02/10/2021   GLUCOSE 157 (H) 02/10/2021   BUN 10 02/10/2021   CREATININE 0.99 (H) 02/10/2021   BILITOT 0.7 11/29/2020   ALKPHOS 51 09/01/2015   AST 10 11/29/2020   ALT 3 (L) 11/29/2020   PROT 7.7 11/29/2020   ALBUMIN 3.4 (L) 09/01/2015   CALCIUM 9.7 02/10/2021   Lab Results  Component Value Date   CHOL 128 09/01/2015   Lab Results  Component Value Date   HDL 42 (L) 09/01/2015   Lab Results  Component Value Date   LDLCALC 61 09/01/2015   Lab Results  Component Value Date   TRIG 124 09/01/2015   Lab Results  Component Value Date   CHOLHDL 3.0 09/01/2015   Lab Results  Component Value Date   HGBA1C 6.2 (A) 02/10/2021      Assessment & Plan:   Problem List Items Addressed This Visit       Respiratory   COPD, group D, by GOLD 2017 classification (HCC)    Stable.  In fact her home oxygen levels have been much better since we have decreased some of her more sedating medications we discussed how sometimes that can actually cause respiratory depression I think overall she is doing better and in fact has not really used her oxygen in most a month.  That she is being monitored carefully.        Endocrine   Hypothyroidism - Primary    Due to recheck TSH.  She takes her medication regularly.      Relevant Orders   TSH (Completed)   Diabetes mellitus with neuropathy (HCC)    A1c 6.2 today.  She is doing well off of the insulin and just down to once a day on the metformin this has improved her diarrhea as well and really ultimately like to get her off of the metformin we discussed the importance of just controlling diet and I really think we would probably  be able to do that and keep her A1c under 6.5.  We will continue with the once daily dosing for now for at least another month or 2.      Relevant Orders   POCT glycosylated hemoglobin (Hb A1C) (Completed)     Genitourinary   Chronic kidney disease (CKD) stage G3b/A2, moderately decreased glomerular filtration rate (GFR) between 30-44 mL/min/1.73 square meter and albuminuria creatinine ratio between 30-299 mg/g (HCC)  Recheck renal function today.         Other   Left foot drop    Recommend that she has customized braces for her legs for her Left foot drop to reduce her risk of falls and enable increased mobility.      Relevant Orders   Ambulatory referral for Orthotics   Dehydration    Blood pressure is still low and she is off of all blood pressure medications except for her Lasix every other day.  She says on the days that she does not take it she does notice that she gets a little bit more swelling in her ankles but it is mild and tolerable.  She really reports that she is pushing her fluids regularly now.      Other Visit Diagnoses     Acute cystitis without hematuria       Relevant Orders   Urine Culture (Completed)   Hypotension, unspecified hypotension type       Relevant Orders   BASIC METABOLIC PANEL WITH GFR (Completed)      UTI-symptoms have resolved but I do wonder go ahead and recheck a urine and do a urine culture just to make sure that her UTI has completely resolved.  Again continue to increase fluids.  No orders of the defined types were placed in this encounter.   Follow-up: Return in about 4 months (around 06/13/2021) for bp/dm.    [name], MD

## 2021-02-10 NOTE — Assessment & Plan Note (Signed)
Recheck renal function today 

## 2021-02-10 NOTE — Assessment & Plan Note (Signed)
A1c 6.2 today.  She is doing well off of the insulin and just down to once a day on the metformin this has improved her diarrhea as well and really ultimately like to get her off of the metformin we discussed the importance of just controlling diet and I really think we would probably be able to do that and keep her A1c under 6.5.  We will continue with the once daily dosing for now for at least another month or 2.

## 2021-02-10 NOTE — Addendum Note (Signed)
Addended by: [name] D on: 02/10/2021 03:10 PM   Modules accepted: Orders

## 2021-02-10 NOTE — Assessment & Plan Note (Signed)
Due to recheck TSH.  She takes her medication regularly.

## 2021-02-10 NOTE — Assessment & Plan Note (Signed)
Stable.  In fact her home oxygen levels have been much better since we have decreased some of her more sedating medications we discussed how sometimes that can actually cause respiratory depression I think overall she is doing better and in fact has not really used her oxygen in most a month.  That she is being monitored carefully.

## 2021-02-11 DIAGNOSIS — G6181 Chronic inflammatory demyelinating polyneuritis: Secondary | ICD-10-CM | POA: Diagnosis not present

## 2021-02-11 DIAGNOSIS — I10 Essential (primary) hypertension: Secondary | ICD-10-CM | POA: Diagnosis not present

## 2021-02-11 DIAGNOSIS — K580 Irritable bowel syndrome with diarrhea: Secondary | ICD-10-CM | POA: Diagnosis not present

## 2021-02-11 DIAGNOSIS — J449 Chronic obstructive pulmonary disease, unspecified: Secondary | ICD-10-CM | POA: Diagnosis not present

## 2021-02-11 DIAGNOSIS — E1165 Type 2 diabetes mellitus with hyperglycemia: Secondary | ICD-10-CM | POA: Diagnosis not present

## 2021-02-11 DIAGNOSIS — J9611 Chronic respiratory failure with hypoxia: Secondary | ICD-10-CM | POA: Diagnosis not present

## 2021-02-11 LAB — BASIC METABOLIC PANEL WITH GFR
BUN/Creatinine Ratio: 10 (calc) (ref 6–22)
BUN: 10 mg/dL (ref 7–25)
CO2: 27 mmol/L (ref 20–32)
Calcium: 9.7 mg/dL (ref 8.6–10.4)
Chloride: 96 mmol/L — ABNORMAL LOW (ref 98–110)
Creat: 0.99 mg/dL — ABNORMAL HIGH (ref 0.60–0.93)
GFR, Est African American: 64 mL/min/{1.73_m2} (ref >=60)
GFR, Est Non African American: 55 mL/min/{1.73_m2} — ABNORMAL LOW (ref >=60)
Glucose, Bld: 157 mg/dL — ABNORMAL HIGH (ref 65–99)
Potassium: 4.1 mmol/L (ref 3.5–5.3)
Sodium: 136 mmol/L (ref 135–146)

## 2021-02-11 LAB — TSH: TSH: 0.7 mIU/L (ref 0.40–4.50)

## 2021-02-12 LAB — URINE CULTURE
MICRO NUMBER:: 12095120
SPECIMEN QUALITY:: ADEQUATE

## 2021-02-14 DIAGNOSIS — E1165 Type 2 diabetes mellitus with hyperglycemia: Secondary | ICD-10-CM | POA: Diagnosis not present

## 2021-02-14 DIAGNOSIS — J9611 Chronic respiratory failure with hypoxia: Secondary | ICD-10-CM | POA: Diagnosis not present

## 2021-02-14 DIAGNOSIS — K580 Irritable bowel syndrome with diarrhea: Secondary | ICD-10-CM | POA: Diagnosis not present

## 2021-02-14 DIAGNOSIS — I10 Essential (primary) hypertension: Secondary | ICD-10-CM | POA: Diagnosis not present

## 2021-02-14 DIAGNOSIS — J449 Chronic obstructive pulmonary disease, unspecified: Secondary | ICD-10-CM | POA: Diagnosis not present

## 2021-02-14 DIAGNOSIS — G6181 Chronic inflammatory demyelinating polyneuritis: Secondary | ICD-10-CM | POA: Diagnosis not present

## 2021-02-15 DIAGNOSIS — E1165 Type 2 diabetes mellitus with hyperglycemia: Secondary | ICD-10-CM | POA: Diagnosis not present

## 2021-02-15 DIAGNOSIS — J9611 Chronic respiratory failure with hypoxia: Secondary | ICD-10-CM | POA: Diagnosis not present

## 2021-02-15 DIAGNOSIS — G6181 Chronic inflammatory demyelinating polyneuritis: Secondary | ICD-10-CM | POA: Diagnosis not present

## 2021-02-15 DIAGNOSIS — J449 Chronic obstructive pulmonary disease, unspecified: Secondary | ICD-10-CM | POA: Diagnosis not present

## 2021-02-15 DIAGNOSIS — I10 Essential (primary) hypertension: Secondary | ICD-10-CM | POA: Diagnosis not present

## 2021-02-15 DIAGNOSIS — K580 Irritable bowel syndrome with diarrhea: Secondary | ICD-10-CM | POA: Diagnosis not present

## 2021-02-16 ENCOUNTER — Telehealth: Payer: Self-pay | Admitting: *Deleted

## 2021-02-16 ENCOUNTER — Other Ambulatory Visit: Payer: Self-pay | Admitting: Family Medicine

## 2021-02-16 ENCOUNTER — Other Ambulatory Visit: Payer: Self-pay | Admitting: *Deleted

## 2021-02-16 DIAGNOSIS — M21371 Foot drop, right foot: Secondary | ICD-10-CM

## 2021-02-16 DIAGNOSIS — Z9181 History of falling: Secondary | ICD-10-CM

## 2021-02-16 DIAGNOSIS — M21372 Foot drop, left foot: Secondary | ICD-10-CM

## 2021-02-16 DIAGNOSIS — K580 Irritable bowel syndrome with diarrhea: Secondary | ICD-10-CM | POA: Diagnosis not present

## 2021-02-16 DIAGNOSIS — G6181 Chronic inflammatory demyelinating polyneuritis: Secondary | ICD-10-CM | POA: Diagnosis not present

## 2021-02-16 DIAGNOSIS — J449 Chronic obstructive pulmonary disease, unspecified: Secondary | ICD-10-CM | POA: Diagnosis not present

## 2021-02-16 DIAGNOSIS — I10 Essential (primary) hypertension: Secondary | ICD-10-CM | POA: Diagnosis not present

## 2021-02-16 DIAGNOSIS — E039 Hypothyroidism, unspecified: Secondary | ICD-10-CM

## 2021-02-16 DIAGNOSIS — J9611 Chronic respiratory failure with hypoxia: Secondary | ICD-10-CM | POA: Diagnosis not present

## 2021-02-16 DIAGNOSIS — E1165 Type 2 diabetes mellitus with hyperglycemia: Secondary | ICD-10-CM | POA: Diagnosis not present

## 2021-02-16 MED ORDER — LEVOTHYROXINE SODIUM 112 MCG PO TABS: 112.0000 ug | ORAL_TABLET | Freq: Every day | ORAL | 1 refills | Status: DC

## 2021-02-16 MED ORDER — AMBULATORY NON FORMULARY MEDICATION: 0 refills | Status: DC

## 2021-02-16 NOTE — Progress Notes (Signed)
Meds ordered this encounter  Medications   levothyroxine (SYNTHROID) 112 MCG tablet    Sig: Take 1 tablet (112 mcg total) by mouth daily.    Dispense:  30 tablet    Refill:  1

## 2021-02-16 NOTE — Telephone Encounter (Signed)
Spoke w/[name] he wanted clarification of thyroid medication because he did not see this in her home. I did advise him that she was taking this however Dr. Madilyn Fireman changed her dosage today to 112 mcg. He will note this in her chart

## 2021-02-17 DIAGNOSIS — G6181 Chronic inflammatory demyelinating polyneuritis: Secondary | ICD-10-CM | POA: Diagnosis not present

## 2021-02-17 DIAGNOSIS — E1165 Type 2 diabetes mellitus with hyperglycemia: Secondary | ICD-10-CM | POA: Diagnosis not present

## 2021-02-17 DIAGNOSIS — J449 Chronic obstructive pulmonary disease, unspecified: Secondary | ICD-10-CM | POA: Diagnosis not present

## 2021-02-17 DIAGNOSIS — J9611 Chronic respiratory failure with hypoxia: Secondary | ICD-10-CM | POA: Diagnosis not present

## 2021-02-17 DIAGNOSIS — K580 Irritable bowel syndrome with diarrhea: Secondary | ICD-10-CM | POA: Diagnosis not present

## 2021-02-17 DIAGNOSIS — I10 Essential (primary) hypertension: Secondary | ICD-10-CM | POA: Diagnosis not present

## 2021-02-21 ENCOUNTER — Other Ambulatory Visit: Payer: Self-pay | Admitting: Family Medicine

## 2021-02-21 DIAGNOSIS — I10 Essential (primary) hypertension: Secondary | ICD-10-CM | POA: Diagnosis not present

## 2021-02-21 DIAGNOSIS — G6181 Chronic inflammatory demyelinating polyneuritis: Secondary | ICD-10-CM | POA: Diagnosis not present

## 2021-02-21 DIAGNOSIS — E1165 Type 2 diabetes mellitus with hyperglycemia: Secondary | ICD-10-CM | POA: Diagnosis not present

## 2021-02-21 DIAGNOSIS — J449 Chronic obstructive pulmonary disease, unspecified: Secondary | ICD-10-CM | POA: Diagnosis not present

## 2021-02-21 DIAGNOSIS — K580 Irritable bowel syndrome with diarrhea: Secondary | ICD-10-CM | POA: Diagnosis not present

## 2021-02-21 DIAGNOSIS — J9611 Chronic respiratory failure with hypoxia: Secondary | ICD-10-CM | POA: Diagnosis not present

## 2021-02-22 DIAGNOSIS — I10 Essential (primary) hypertension: Secondary | ICD-10-CM | POA: Diagnosis not present

## 2021-02-22 DIAGNOSIS — E1165 Type 2 diabetes mellitus with hyperglycemia: Secondary | ICD-10-CM | POA: Diagnosis not present

## 2021-02-22 DIAGNOSIS — J449 Chronic obstructive pulmonary disease, unspecified: Secondary | ICD-10-CM | POA: Diagnosis not present

## 2021-02-22 DIAGNOSIS — J9611 Chronic respiratory failure with hypoxia: Secondary | ICD-10-CM | POA: Diagnosis not present

## 2021-02-22 DIAGNOSIS — K580 Irritable bowel syndrome with diarrhea: Secondary | ICD-10-CM | POA: Diagnosis not present

## 2021-02-22 DIAGNOSIS — G6181 Chronic inflammatory demyelinating polyneuritis: Secondary | ICD-10-CM | POA: Diagnosis not present

## 2021-02-24 ENCOUNTER — Telehealth: Payer: Self-pay | Admitting: *Deleted

## 2021-02-24 ENCOUNTER — Other Ambulatory Visit: Payer: Self-pay | Admitting: Family Medicine

## 2021-02-24 NOTE — Telephone Encounter (Signed)
Spoke w/[facility]. She would like to decrease  the frequency of  her HHA.   New frequency 2x a week for  5 weeks for  Home health aid

## 2021-02-24 NOTE — Telephone Encounter (Signed)
Attempted to return call from [place] at [employer]. No answer or VM.  Callback [phone]

## 2021-02-25 DIAGNOSIS — I10 Essential (primary) hypertension: Secondary | ICD-10-CM | POA: Diagnosis not present

## 2021-02-25 DIAGNOSIS — K580 Irritable bowel syndrome with diarrhea: Secondary | ICD-10-CM | POA: Diagnosis not present

## 2021-02-25 DIAGNOSIS — J9611 Chronic respiratory failure with hypoxia: Secondary | ICD-10-CM | POA: Diagnosis not present

## 2021-02-25 DIAGNOSIS — E1165 Type 2 diabetes mellitus with hyperglycemia: Secondary | ICD-10-CM | POA: Diagnosis not present

## 2021-02-25 DIAGNOSIS — J449 Chronic obstructive pulmonary disease, unspecified: Secondary | ICD-10-CM | POA: Diagnosis not present

## 2021-02-25 DIAGNOSIS — G6181 Chronic inflammatory demyelinating polyneuritis: Secondary | ICD-10-CM | POA: Diagnosis not present

## 2021-02-28 DIAGNOSIS — K580 Irritable bowel syndrome with diarrhea: Secondary | ICD-10-CM | POA: Diagnosis not present

## 2021-02-28 DIAGNOSIS — E1165 Type 2 diabetes mellitus with hyperglycemia: Secondary | ICD-10-CM | POA: Diagnosis not present

## 2021-02-28 DIAGNOSIS — J9611 Chronic respiratory failure with hypoxia: Secondary | ICD-10-CM | POA: Diagnosis not present

## 2021-02-28 DIAGNOSIS — G6181 Chronic inflammatory demyelinating polyneuritis: Secondary | ICD-10-CM | POA: Diagnosis not present

## 2021-02-28 DIAGNOSIS — I10 Essential (primary) hypertension: Secondary | ICD-10-CM | POA: Diagnosis not present

## 2021-02-28 DIAGNOSIS — J449 Chronic obstructive pulmonary disease, unspecified: Secondary | ICD-10-CM | POA: Diagnosis not present

## 2021-03-01 ENCOUNTER — Telehealth: Payer: Self-pay | Admitting: *Deleted

## 2021-03-01 NOTE — Telephone Encounter (Signed)
Nunzio Cory w/[facility] called for Verbal Orders to continue PT for pt 2x weekly  for 2 weeks  effective 03/06/2021

## 2021-03-02 DIAGNOSIS — J9611 Chronic respiratory failure with hypoxia: Secondary | ICD-10-CM | POA: Diagnosis not present

## 2021-03-02 DIAGNOSIS — G6181 Chronic inflammatory demyelinating polyneuritis: Secondary | ICD-10-CM | POA: Diagnosis not present

## 2021-03-02 DIAGNOSIS — J449 Chronic obstructive pulmonary disease, unspecified: Secondary | ICD-10-CM | POA: Diagnosis not present

## 2021-03-02 DIAGNOSIS — K580 Irritable bowel syndrome with diarrhea: Secondary | ICD-10-CM | POA: Diagnosis not present

## 2021-03-02 DIAGNOSIS — I10 Essential (primary) hypertension: Secondary | ICD-10-CM | POA: Diagnosis not present

## 2021-03-02 DIAGNOSIS — E1165 Type 2 diabetes mellitus with hyperglycemia: Secondary | ICD-10-CM | POA: Diagnosis not present

## 2021-03-03 ENCOUNTER — Telehealth: Payer: Self-pay

## 2021-03-03 NOTE — Telephone Encounter (Signed)
Hi [name],  Do you have any thoughts on this?  I know there are machines that read the numbers out loud but you still have to get the strip into the machine.  Not sure if there are some that are more friendly for those who are visually impaired?

## 2021-03-03 NOTE — Telephone Encounter (Signed)
Delorise Jackson, RN with [facility] called stating that the pt's vision is so bad that she can't see to put blood on the strip to check blood sugars.  Ms. Raquel Sarna will be doing nursing visits with her once weekly for the next 6 weeks and wondered if there are any alternatives the patient can utilize to help with checking blood sugars.  Please advise.  Charyl Bigger, CMA

## 2021-03-06 DIAGNOSIS — G600 Hereditary motor and sensory neuropathy: Secondary | ICD-10-CM | POA: Diagnosis not present

## 2021-03-06 DIAGNOSIS — R339 Retention of urine, unspecified: Secondary | ICD-10-CM | POA: Diagnosis not present

## 2021-03-06 DIAGNOSIS — E039 Hypothyroidism, unspecified: Secondary | ICD-10-CM | POA: Diagnosis not present

## 2021-03-06 DIAGNOSIS — K580 Irritable bowel syndrome with diarrhea: Secondary | ICD-10-CM | POA: Diagnosis not present

## 2021-03-06 DIAGNOSIS — E538 Deficiency of other specified B group vitamins: Secondary | ICD-10-CM | POA: Diagnosis not present

## 2021-03-06 DIAGNOSIS — G6181 Chronic inflammatory demyelinating polyneuritis: Secondary | ICD-10-CM | POA: Diagnosis not present

## 2021-03-06 DIAGNOSIS — K219 Gastro-esophageal reflux disease without esophagitis: Secondary | ICD-10-CM | POA: Diagnosis not present

## 2021-03-06 DIAGNOSIS — E7800 Pure hypercholesterolemia, unspecified: Secondary | ICD-10-CM | POA: Diagnosis not present

## 2021-03-06 DIAGNOSIS — Z7951 Long term (current) use of inhaled steroids: Secondary | ICD-10-CM | POA: Diagnosis not present

## 2021-03-06 DIAGNOSIS — I10 Essential (primary) hypertension: Secondary | ICD-10-CM | POA: Diagnosis not present

## 2021-03-06 DIAGNOSIS — F32A Depression, unspecified: Secondary | ICD-10-CM | POA: Diagnosis not present

## 2021-03-06 DIAGNOSIS — Z7902 Long term (current) use of antithrombotics/antiplatelets: Secondary | ICD-10-CM | POA: Diagnosis not present

## 2021-03-06 DIAGNOSIS — Z9981 Dependence on supplemental oxygen: Secondary | ICD-10-CM | POA: Diagnosis not present

## 2021-03-06 DIAGNOSIS — E1165 Type 2 diabetes mellitus with hyperglycemia: Secondary | ICD-10-CM | POA: Diagnosis not present

## 2021-03-06 DIAGNOSIS — H3530 Unspecified macular degeneration: Secondary | ICD-10-CM | POA: Diagnosis not present

## 2021-03-06 DIAGNOSIS — J449 Chronic obstructive pulmonary disease, unspecified: Secondary | ICD-10-CM | POA: Diagnosis not present

## 2021-03-06 DIAGNOSIS — Z9181 History of falling: Secondary | ICD-10-CM | POA: Diagnosis not present

## 2021-03-06 DIAGNOSIS — G4730 Sleep apnea, unspecified: Secondary | ICD-10-CM | POA: Diagnosis not present

## 2021-03-06 DIAGNOSIS — J9611 Chronic respiratory failure with hypoxia: Secondary | ICD-10-CM | POA: Diagnosis not present

## 2021-03-06 DIAGNOSIS — E1136 Type 2 diabetes mellitus with diabetic cataract: Secondary | ICD-10-CM | POA: Diagnosis not present

## 2021-03-06 DIAGNOSIS — D638 Anemia in other chronic diseases classified elsewhere: Secondary | ICD-10-CM | POA: Diagnosis not present

## 2021-03-09 ENCOUNTER — Telehealth: Payer: Self-pay

## 2021-03-09 NOTE — Telephone Encounter (Signed)
Signed orders faxed to [facility].  Received confirmation sheet.  Charyl Bigger, CMA

## 2021-03-11 ENCOUNTER — Other Ambulatory Visit: Payer: Self-pay | Admitting: Family Medicine

## 2021-03-13 ENCOUNTER — Other Ambulatory Visit: Payer: Self-pay | Admitting: Family Medicine

## 2021-03-16 ENCOUNTER — Telehealth: Payer: Self-pay | Admitting: *Deleted

## 2021-03-16 NOTE — Telephone Encounter (Signed)
Wells Guiles w/[facility] calling for VO for PT to be moved to this week. Verbal order given to get this done.

## 2021-03-17 DIAGNOSIS — J9611 Chronic respiratory failure with hypoxia: Secondary | ICD-10-CM | POA: Diagnosis not present

## 2021-03-17 DIAGNOSIS — G6181 Chronic inflammatory demyelinating polyneuritis: Secondary | ICD-10-CM | POA: Diagnosis not present

## 2021-03-17 DIAGNOSIS — J449 Chronic obstructive pulmonary disease, unspecified: Secondary | ICD-10-CM | POA: Diagnosis not present

## 2021-03-17 DIAGNOSIS — I10 Essential (primary) hypertension: Secondary | ICD-10-CM | POA: Diagnosis not present

## 2021-03-17 DIAGNOSIS — E1165 Type 2 diabetes mellitus with hyperglycemia: Secondary | ICD-10-CM | POA: Diagnosis not present

## 2021-03-17 DIAGNOSIS — K580 Irritable bowel syndrome with diarrhea: Secondary | ICD-10-CM | POA: Diagnosis not present

## 2021-03-22 ENCOUNTER — Other Ambulatory Visit: Payer: Self-pay | Admitting: *Deleted

## 2021-03-22 DIAGNOSIS — G6181 Chronic inflammatory demyelinating polyneuritis: Secondary | ICD-10-CM | POA: Diagnosis not present

## 2021-03-22 DIAGNOSIS — J9611 Chronic respiratory failure with hypoxia: Secondary | ICD-10-CM | POA: Diagnosis not present

## 2021-03-22 DIAGNOSIS — I10 Essential (primary) hypertension: Secondary | ICD-10-CM | POA: Diagnosis not present

## 2021-03-22 DIAGNOSIS — K580 Irritable bowel syndrome with diarrhea: Secondary | ICD-10-CM | POA: Diagnosis not present

## 2021-03-22 DIAGNOSIS — M21371 Foot drop, right foot: Secondary | ICD-10-CM

## 2021-03-22 DIAGNOSIS — J449 Chronic obstructive pulmonary disease, unspecified: Secondary | ICD-10-CM | POA: Diagnosis not present

## 2021-03-22 DIAGNOSIS — Z9181 History of falling: Secondary | ICD-10-CM

## 2021-03-22 DIAGNOSIS — E1165 Type 2 diabetes mellitus with hyperglycemia: Secondary | ICD-10-CM | POA: Diagnosis not present

## 2021-03-22 MED ORDER — ZOLPIDEM TARTRATE 5 MG PO TABS: 2.5000 mg | ORAL_TABLET | Freq: Every day | ORAL | 0 refills | Status: DC

## 2021-03-22 MED ORDER — AMBULATORY NON FORMULARY MEDICATION: 0 refills | Status: DC

## 2021-03-22 MED ORDER — METFORMIN HCL 500 MG PO TABS: 500.0000 mg | ORAL_TABLET | Freq: Every day | ORAL | 0 refills | Status: DC

## 2021-03-22 MED ORDER — FUROSEMIDE 40 MG PO TABS: 40.0000 mg | ORAL_TABLET | ORAL | 0 refills | Status: DC

## 2021-03-22 NOTE — Telephone Encounter (Signed)
She still needs to work on taking half a tab.  She still needs to wean down. I know this is hard but we have to keep working at it. Also make sure she is wearing her oxygen at night.

## 2021-03-22 NOTE — Telephone Encounter (Signed)
Pr reports that she hasn't been able to get to sleep with taking the 1/2 tab of Ambien and she started taking a whole tab. She has no medication left and would like a refill sent to her pharmacy.

## 2021-03-23 ENCOUNTER — Other Ambulatory Visit: Payer: Self-pay | Admitting: Family Medicine

## 2021-03-23 DIAGNOSIS — I10 Essential (primary) hypertension: Secondary | ICD-10-CM | POA: Diagnosis not present

## 2021-03-23 DIAGNOSIS — G6181 Chronic inflammatory demyelinating polyneuritis: Secondary | ICD-10-CM | POA: Diagnosis not present

## 2021-03-23 DIAGNOSIS — E1165 Type 2 diabetes mellitus with hyperglycemia: Secondary | ICD-10-CM | POA: Diagnosis not present

## 2021-03-23 DIAGNOSIS — K580 Irritable bowel syndrome with diarrhea: Secondary | ICD-10-CM | POA: Diagnosis not present

## 2021-03-23 DIAGNOSIS — J9611 Chronic respiratory failure with hypoxia: Secondary | ICD-10-CM | POA: Diagnosis not present

## 2021-03-23 DIAGNOSIS — J449 Chronic obstructive pulmonary disease, unspecified: Secondary | ICD-10-CM | POA: Diagnosis not present

## 2021-03-23 MED ORDER — FUROSEMIDE 40 MG PO TABS
40.0000 mg | ORAL_TABLET | ORAL | 1 refills | Status: DC
Start: 2021-03-23 — End: 2021-10-24

## 2021-03-24 NOTE — Telephone Encounter (Signed)
Pt advised of recommendations.  

## 2021-03-25 DIAGNOSIS — J9611 Chronic respiratory failure with hypoxia: Secondary | ICD-10-CM | POA: Diagnosis not present

## 2021-03-25 DIAGNOSIS — J449 Chronic obstructive pulmonary disease, unspecified: Secondary | ICD-10-CM | POA: Diagnosis not present

## 2021-03-25 DIAGNOSIS — I10 Essential (primary) hypertension: Secondary | ICD-10-CM | POA: Diagnosis not present

## 2021-03-25 DIAGNOSIS — K580 Irritable bowel syndrome with diarrhea: Secondary | ICD-10-CM | POA: Diagnosis not present

## 2021-03-25 DIAGNOSIS — E1165 Type 2 diabetes mellitus with hyperglycemia: Secondary | ICD-10-CM | POA: Diagnosis not present

## 2021-03-25 DIAGNOSIS — G6181 Chronic inflammatory demyelinating polyneuritis: Secondary | ICD-10-CM | POA: Diagnosis not present

## 2021-03-29 DIAGNOSIS — G6181 Chronic inflammatory demyelinating polyneuritis: Secondary | ICD-10-CM | POA: Diagnosis not present

## 2021-03-29 DIAGNOSIS — I10 Essential (primary) hypertension: Secondary | ICD-10-CM | POA: Diagnosis not present

## 2021-03-29 DIAGNOSIS — E1165 Type 2 diabetes mellitus with hyperglycemia: Secondary | ICD-10-CM | POA: Diagnosis not present

## 2021-03-29 DIAGNOSIS — K580 Irritable bowel syndrome with diarrhea: Secondary | ICD-10-CM | POA: Diagnosis not present

## 2021-03-29 DIAGNOSIS — J449 Chronic obstructive pulmonary disease, unspecified: Secondary | ICD-10-CM | POA: Diagnosis not present

## 2021-03-29 DIAGNOSIS — J9611 Chronic respiratory failure with hypoxia: Secondary | ICD-10-CM | POA: Diagnosis not present

## 2021-03-30 ENCOUNTER — Other Ambulatory Visit: Payer: Self-pay | Admitting: Family Medicine

## 2021-03-30 DIAGNOSIS — J449 Chronic obstructive pulmonary disease, unspecified: Secondary | ICD-10-CM | POA: Diagnosis not present

## 2021-03-30 DIAGNOSIS — E1165 Type 2 diabetes mellitus with hyperglycemia: Secondary | ICD-10-CM | POA: Diagnosis not present

## 2021-03-30 DIAGNOSIS — K591 Functional diarrhea: Secondary | ICD-10-CM

## 2021-03-30 DIAGNOSIS — I10 Essential (primary) hypertension: Secondary | ICD-10-CM | POA: Diagnosis not present

## 2021-03-30 DIAGNOSIS — J9611 Chronic respiratory failure with hypoxia: Secondary | ICD-10-CM | POA: Diagnosis not present

## 2021-03-30 DIAGNOSIS — K580 Irritable bowel syndrome with diarrhea: Secondary | ICD-10-CM | POA: Diagnosis not present

## 2021-03-30 DIAGNOSIS — G6181 Chronic inflammatory demyelinating polyneuritis: Secondary | ICD-10-CM | POA: Diagnosis not present

## 2021-04-03 ENCOUNTER — Other Ambulatory Visit: Payer: Self-pay | Admitting: Family Medicine

## 2021-04-04 DIAGNOSIS — E1165 Type 2 diabetes mellitus with hyperglycemia: Secondary | ICD-10-CM | POA: Diagnosis not present

## 2021-04-04 DIAGNOSIS — G6181 Chronic inflammatory demyelinating polyneuritis: Secondary | ICD-10-CM | POA: Diagnosis not present

## 2021-04-04 DIAGNOSIS — I10 Essential (primary) hypertension: Secondary | ICD-10-CM | POA: Diagnosis not present

## 2021-04-04 DIAGNOSIS — K580 Irritable bowel syndrome with diarrhea: Secondary | ICD-10-CM | POA: Diagnosis not present

## 2021-04-04 DIAGNOSIS — J9611 Chronic respiratory failure with hypoxia: Secondary | ICD-10-CM | POA: Diagnosis not present

## 2021-04-04 DIAGNOSIS — J449 Chronic obstructive pulmonary disease, unspecified: Secondary | ICD-10-CM | POA: Diagnosis not present

## 2021-04-05 DIAGNOSIS — E538 Deficiency of other specified B group vitamins: Secondary | ICD-10-CM | POA: Diagnosis not present

## 2021-04-05 DIAGNOSIS — R339 Retention of urine, unspecified: Secondary | ICD-10-CM | POA: Diagnosis not present

## 2021-04-05 DIAGNOSIS — E1165 Type 2 diabetes mellitus with hyperglycemia: Secondary | ICD-10-CM | POA: Diagnosis not present

## 2021-04-05 DIAGNOSIS — E039 Hypothyroidism, unspecified: Secondary | ICD-10-CM | POA: Diagnosis not present

## 2021-04-05 DIAGNOSIS — Z7902 Long term (current) use of antithrombotics/antiplatelets: Secondary | ICD-10-CM | POA: Diagnosis not present

## 2021-04-05 DIAGNOSIS — J9611 Chronic respiratory failure with hypoxia: Secondary | ICD-10-CM | POA: Diagnosis not present

## 2021-04-05 DIAGNOSIS — E7800 Pure hypercholesterolemia, unspecified: Secondary | ICD-10-CM | POA: Diagnosis not present

## 2021-04-05 DIAGNOSIS — I10 Essential (primary) hypertension: Secondary | ICD-10-CM | POA: Diagnosis not present

## 2021-04-05 DIAGNOSIS — Z9981 Dependence on supplemental oxygen: Secondary | ICD-10-CM | POA: Diagnosis not present

## 2021-04-05 DIAGNOSIS — F32A Depression, unspecified: Secondary | ICD-10-CM | POA: Diagnosis not present

## 2021-04-05 DIAGNOSIS — K580 Irritable bowel syndrome with diarrhea: Secondary | ICD-10-CM | POA: Diagnosis not present

## 2021-04-05 DIAGNOSIS — E1136 Type 2 diabetes mellitus with diabetic cataract: Secondary | ICD-10-CM | POA: Diagnosis not present

## 2021-04-05 DIAGNOSIS — G600 Hereditary motor and sensory neuropathy: Secondary | ICD-10-CM | POA: Diagnosis not present

## 2021-04-05 DIAGNOSIS — K219 Gastro-esophageal reflux disease without esophagitis: Secondary | ICD-10-CM | POA: Diagnosis not present

## 2021-04-05 DIAGNOSIS — J449 Chronic obstructive pulmonary disease, unspecified: Secondary | ICD-10-CM | POA: Diagnosis not present

## 2021-04-05 DIAGNOSIS — Z9181 History of falling: Secondary | ICD-10-CM | POA: Diagnosis not present

## 2021-04-05 DIAGNOSIS — H3530 Unspecified macular degeneration: Secondary | ICD-10-CM | POA: Diagnosis not present

## 2021-04-05 DIAGNOSIS — G6181 Chronic inflammatory demyelinating polyneuritis: Secondary | ICD-10-CM | POA: Diagnosis not present

## 2021-04-05 DIAGNOSIS — G4730 Sleep apnea, unspecified: Secondary | ICD-10-CM | POA: Diagnosis not present

## 2021-04-05 DIAGNOSIS — D638 Anemia in other chronic diseases classified elsewhere: Secondary | ICD-10-CM | POA: Diagnosis not present

## 2021-04-05 DIAGNOSIS — Z7951 Long term (current) use of inhaled steroids: Secondary | ICD-10-CM | POA: Diagnosis not present

## 2021-04-07 DIAGNOSIS — J449 Chronic obstructive pulmonary disease, unspecified: Secondary | ICD-10-CM | POA: Diagnosis not present

## 2021-04-07 DIAGNOSIS — K580 Irritable bowel syndrome with diarrhea: Secondary | ICD-10-CM | POA: Diagnosis not present

## 2021-04-07 DIAGNOSIS — J9611 Chronic respiratory failure with hypoxia: Secondary | ICD-10-CM | POA: Diagnosis not present

## 2021-04-07 DIAGNOSIS — E1165 Type 2 diabetes mellitus with hyperglycemia: Secondary | ICD-10-CM | POA: Diagnosis not present

## 2021-04-07 DIAGNOSIS — I10 Essential (primary) hypertension: Secondary | ICD-10-CM | POA: Diagnosis not present

## 2021-04-07 DIAGNOSIS — G6181 Chronic inflammatory demyelinating polyneuritis: Secondary | ICD-10-CM | POA: Diagnosis not present

## 2021-04-08 DIAGNOSIS — J9611 Chronic respiratory failure with hypoxia: Secondary | ICD-10-CM | POA: Diagnosis not present

## 2021-04-08 DIAGNOSIS — J449 Chronic obstructive pulmonary disease, unspecified: Secondary | ICD-10-CM | POA: Diagnosis not present

## 2021-04-08 DIAGNOSIS — K580 Irritable bowel syndrome with diarrhea: Secondary | ICD-10-CM | POA: Diagnosis not present

## 2021-04-08 DIAGNOSIS — G6181 Chronic inflammatory demyelinating polyneuritis: Secondary | ICD-10-CM | POA: Diagnosis not present

## 2021-04-08 DIAGNOSIS — E1165 Type 2 diabetes mellitus with hyperglycemia: Secondary | ICD-10-CM | POA: Diagnosis not present

## 2021-04-08 DIAGNOSIS — I10 Essential (primary) hypertension: Secondary | ICD-10-CM | POA: Diagnosis not present

## 2021-04-11 DIAGNOSIS — E1165 Type 2 diabetes mellitus with hyperglycemia: Secondary | ICD-10-CM | POA: Diagnosis not present

## 2021-04-11 DIAGNOSIS — G6181 Chronic inflammatory demyelinating polyneuritis: Secondary | ICD-10-CM | POA: Diagnosis not present

## 2021-04-11 DIAGNOSIS — K580 Irritable bowel syndrome with diarrhea: Secondary | ICD-10-CM | POA: Diagnosis not present

## 2021-04-11 DIAGNOSIS — I10 Essential (primary) hypertension: Secondary | ICD-10-CM | POA: Diagnosis not present

## 2021-04-11 DIAGNOSIS — J449 Chronic obstructive pulmonary disease, unspecified: Secondary | ICD-10-CM | POA: Diagnosis not present

## 2021-04-11 DIAGNOSIS — J9611 Chronic respiratory failure with hypoxia: Secondary | ICD-10-CM | POA: Diagnosis not present

## 2021-04-13 DIAGNOSIS — K580 Irritable bowel syndrome with diarrhea: Secondary | ICD-10-CM | POA: Diagnosis not present

## 2021-04-13 DIAGNOSIS — E1165 Type 2 diabetes mellitus with hyperglycemia: Secondary | ICD-10-CM | POA: Diagnosis not present

## 2021-04-13 DIAGNOSIS — J449 Chronic obstructive pulmonary disease, unspecified: Secondary | ICD-10-CM | POA: Diagnosis not present

## 2021-04-13 DIAGNOSIS — G6181 Chronic inflammatory demyelinating polyneuritis: Secondary | ICD-10-CM | POA: Diagnosis not present

## 2021-04-13 DIAGNOSIS — I10 Essential (primary) hypertension: Secondary | ICD-10-CM | POA: Diagnosis not present

## 2021-04-13 DIAGNOSIS — J9611 Chronic respiratory failure with hypoxia: Secondary | ICD-10-CM | POA: Diagnosis not present

## 2021-04-14 DIAGNOSIS — G6181 Chronic inflammatory demyelinating polyneuritis: Secondary | ICD-10-CM | POA: Diagnosis not present

## 2021-04-14 DIAGNOSIS — I10 Essential (primary) hypertension: Secondary | ICD-10-CM | POA: Diagnosis not present

## 2021-04-14 DIAGNOSIS — E1165 Type 2 diabetes mellitus with hyperglycemia: Secondary | ICD-10-CM | POA: Diagnosis not present

## 2021-04-14 DIAGNOSIS — K580 Irritable bowel syndrome with diarrhea: Secondary | ICD-10-CM | POA: Diagnosis not present

## 2021-04-14 DIAGNOSIS — J449 Chronic obstructive pulmonary disease, unspecified: Secondary | ICD-10-CM | POA: Diagnosis not present

## 2021-04-14 DIAGNOSIS — J9611 Chronic respiratory failure with hypoxia: Secondary | ICD-10-CM | POA: Diagnosis not present

## 2021-04-18 DIAGNOSIS — J9611 Chronic respiratory failure with hypoxia: Secondary | ICD-10-CM | POA: Diagnosis not present

## 2021-04-18 DIAGNOSIS — G6181 Chronic inflammatory demyelinating polyneuritis: Secondary | ICD-10-CM | POA: Diagnosis not present

## 2021-04-18 DIAGNOSIS — J449 Chronic obstructive pulmonary disease, unspecified: Secondary | ICD-10-CM | POA: Diagnosis not present

## 2021-04-18 DIAGNOSIS — K580 Irritable bowel syndrome with diarrhea: Secondary | ICD-10-CM | POA: Diagnosis not present

## 2021-04-18 DIAGNOSIS — E1165 Type 2 diabetes mellitus with hyperglycemia: Secondary | ICD-10-CM | POA: Diagnosis not present

## 2021-04-18 DIAGNOSIS — I10 Essential (primary) hypertension: Secondary | ICD-10-CM | POA: Diagnosis not present

## 2021-04-20 DIAGNOSIS — E1165 Type 2 diabetes mellitus with hyperglycemia: Secondary | ICD-10-CM | POA: Diagnosis not present

## 2021-04-20 DIAGNOSIS — J9611 Chronic respiratory failure with hypoxia: Secondary | ICD-10-CM | POA: Diagnosis not present

## 2021-04-20 DIAGNOSIS — K580 Irritable bowel syndrome with diarrhea: Secondary | ICD-10-CM | POA: Diagnosis not present

## 2021-04-20 DIAGNOSIS — J449 Chronic obstructive pulmonary disease, unspecified: Secondary | ICD-10-CM | POA: Diagnosis not present

## 2021-04-20 DIAGNOSIS — G6181 Chronic inflammatory demyelinating polyneuritis: Secondary | ICD-10-CM | POA: Diagnosis not present

## 2021-04-20 DIAGNOSIS — I10 Essential (primary) hypertension: Secondary | ICD-10-CM | POA: Diagnosis not present

## 2021-04-25 ENCOUNTER — Other Ambulatory Visit: Payer: Self-pay | Admitting: Family Medicine

## 2021-04-25 DIAGNOSIS — G6181 Chronic inflammatory demyelinating polyneuritis: Secondary | ICD-10-CM | POA: Diagnosis not present

## 2021-04-25 DIAGNOSIS — J449 Chronic obstructive pulmonary disease, unspecified: Secondary | ICD-10-CM | POA: Diagnosis not present

## 2021-04-25 DIAGNOSIS — K580 Irritable bowel syndrome with diarrhea: Secondary | ICD-10-CM | POA: Diagnosis not present

## 2021-04-25 DIAGNOSIS — E1165 Type 2 diabetes mellitus with hyperglycemia: Secondary | ICD-10-CM | POA: Diagnosis not present

## 2021-04-25 DIAGNOSIS — J9611 Chronic respiratory failure with hypoxia: Secondary | ICD-10-CM | POA: Diagnosis not present

## 2021-04-25 DIAGNOSIS — I10 Essential (primary) hypertension: Secondary | ICD-10-CM | POA: Diagnosis not present

## 2021-04-27 DIAGNOSIS — J449 Chronic obstructive pulmonary disease, unspecified: Secondary | ICD-10-CM | POA: Diagnosis not present

## 2021-04-27 DIAGNOSIS — E1165 Type 2 diabetes mellitus with hyperglycemia: Secondary | ICD-10-CM | POA: Diagnosis not present

## 2021-04-27 DIAGNOSIS — K580 Irritable bowel syndrome with diarrhea: Secondary | ICD-10-CM | POA: Diagnosis not present

## 2021-04-27 DIAGNOSIS — I10 Essential (primary) hypertension: Secondary | ICD-10-CM | POA: Diagnosis not present

## 2021-04-27 DIAGNOSIS — J9611 Chronic respiratory failure with hypoxia: Secondary | ICD-10-CM | POA: Diagnosis not present

## 2021-04-27 DIAGNOSIS — G6181 Chronic inflammatory demyelinating polyneuritis: Secondary | ICD-10-CM | POA: Diagnosis not present

## 2021-05-03 DIAGNOSIS — G6181 Chronic inflammatory demyelinating polyneuritis: Secondary | ICD-10-CM | POA: Diagnosis not present

## 2021-05-03 DIAGNOSIS — E1165 Type 2 diabetes mellitus with hyperglycemia: Secondary | ICD-10-CM | POA: Diagnosis not present

## 2021-05-03 DIAGNOSIS — J9611 Chronic respiratory failure with hypoxia: Secondary | ICD-10-CM | POA: Diagnosis not present

## 2021-05-03 DIAGNOSIS — K580 Irritable bowel syndrome with diarrhea: Secondary | ICD-10-CM | POA: Diagnosis not present

## 2021-05-03 DIAGNOSIS — I10 Essential (primary) hypertension: Secondary | ICD-10-CM | POA: Diagnosis not present

## 2021-05-03 DIAGNOSIS — J449 Chronic obstructive pulmonary disease, unspecified: Secondary | ICD-10-CM | POA: Diagnosis not present

## 2021-05-04 ENCOUNTER — Telehealth: Payer: Self-pay | Admitting: *Deleted

## 2021-05-04 DIAGNOSIS — G6181 Chronic inflammatory demyelinating polyneuritis: Secondary | ICD-10-CM | POA: Diagnosis not present

## 2021-05-04 DIAGNOSIS — E1165 Type 2 diabetes mellitus with hyperglycemia: Secondary | ICD-10-CM | POA: Diagnosis not present

## 2021-05-04 DIAGNOSIS — I10 Essential (primary) hypertension: Secondary | ICD-10-CM | POA: Diagnosis not present

## 2021-05-04 DIAGNOSIS — J449 Chronic obstructive pulmonary disease, unspecified: Secondary | ICD-10-CM | POA: Diagnosis not present

## 2021-05-04 DIAGNOSIS — K580 Irritable bowel syndrome with diarrhea: Secondary | ICD-10-CM | POA: Diagnosis not present

## 2021-05-04 DIAGNOSIS — J9611 Chronic respiratory failure with hypoxia: Secondary | ICD-10-CM | POA: Diagnosis not present

## 2021-05-04 NOTE — Telephone Encounter (Signed)
[name] called and stated that the OV notes from 02/10/2021 will need to be amended to say that it is also Dr. Gardiner Ramus recommendation that she has customized braces for her legs for her Left foot drop.   [name] said that they have already fitted her and the orthotics are ready to be delivered to the patient they just need this to be done to be compliant with Medicare.  This will need to be faxed to: [phone]

## 2021-05-05 DIAGNOSIS — E039 Hypothyroidism, unspecified: Secondary | ICD-10-CM | POA: Diagnosis not present

## 2021-05-05 DIAGNOSIS — D638 Anemia in other chronic diseases classified elsewhere: Secondary | ICD-10-CM | POA: Diagnosis not present

## 2021-05-05 DIAGNOSIS — E538 Deficiency of other specified B group vitamins: Secondary | ICD-10-CM | POA: Diagnosis not present

## 2021-05-05 DIAGNOSIS — E1165 Type 2 diabetes mellitus with hyperglycemia: Secondary | ICD-10-CM | POA: Diagnosis not present

## 2021-05-05 DIAGNOSIS — H3530 Unspecified macular degeneration: Secondary | ICD-10-CM | POA: Diagnosis not present

## 2021-05-05 DIAGNOSIS — G4733 Obstructive sleep apnea (adult) (pediatric): Secondary | ICD-10-CM | POA: Diagnosis not present

## 2021-05-05 DIAGNOSIS — Z7902 Long term (current) use of antithrombotics/antiplatelets: Secondary | ICD-10-CM | POA: Diagnosis not present

## 2021-05-05 DIAGNOSIS — K219 Gastro-esophageal reflux disease without esophagitis: Secondary | ICD-10-CM | POA: Diagnosis not present

## 2021-05-05 DIAGNOSIS — R339 Retention of urine, unspecified: Secondary | ICD-10-CM | POA: Diagnosis not present

## 2021-05-05 DIAGNOSIS — E1136 Type 2 diabetes mellitus with diabetic cataract: Secondary | ICD-10-CM | POA: Diagnosis not present

## 2021-05-05 DIAGNOSIS — G6181 Chronic inflammatory demyelinating polyneuritis: Secondary | ICD-10-CM | POA: Diagnosis not present

## 2021-05-05 DIAGNOSIS — E7800 Pure hypercholesterolemia, unspecified: Secondary | ICD-10-CM | POA: Diagnosis not present

## 2021-05-05 DIAGNOSIS — K580 Irritable bowel syndrome with diarrhea: Secondary | ICD-10-CM | POA: Diagnosis not present

## 2021-05-05 DIAGNOSIS — J449 Chronic obstructive pulmonary disease, unspecified: Secondary | ICD-10-CM | POA: Diagnosis not present

## 2021-05-05 DIAGNOSIS — J9611 Chronic respiratory failure with hypoxia: Secondary | ICD-10-CM | POA: Diagnosis not present

## 2021-05-05 DIAGNOSIS — F32A Depression, unspecified: Secondary | ICD-10-CM | POA: Diagnosis not present

## 2021-05-05 DIAGNOSIS — G600 Hereditary motor and sensory neuropathy: Secondary | ICD-10-CM | POA: Diagnosis not present

## 2021-05-05 DIAGNOSIS — Z9181 History of falling: Secondary | ICD-10-CM | POA: Diagnosis not present

## 2021-05-05 DIAGNOSIS — I10 Essential (primary) hypertension: Secondary | ICD-10-CM | POA: Diagnosis not present

## 2021-05-05 DIAGNOSIS — Z9981 Dependence on supplemental oxygen: Secondary | ICD-10-CM | POA: Diagnosis not present

## 2021-05-05 DIAGNOSIS — Z7951 Long term (current) use of inhaled steroids: Secondary | ICD-10-CM | POA: Diagnosis not present

## 2021-05-05 DIAGNOSIS — M1990 Unspecified osteoarthritis, unspecified site: Secondary | ICD-10-CM | POA: Diagnosis not present

## 2021-05-06 ENCOUNTER — Telehealth: Payer: Self-pay

## 2021-05-06 NOTE — Telephone Encounter (Signed)
[facility] called requesting VO's for pt to continue PT once a week x 8 weeks starting 05/08/21. When calling back, we can leave VO approval on her confidential VM.

## 2021-05-06 NOTE — Telephone Encounter (Signed)
Please approve verbal order.

## 2021-05-06 NOTE — Telephone Encounter (Signed)
VO's given to [facility].

## 2021-05-06 NOTE — Telephone Encounter (Signed)
Note updated, please fax

## 2021-05-06 NOTE — Assessment & Plan Note (Signed)
Recommend that she has customized braces for her legs for her Left foot drop to reduce her risk of falls and enable increased mobility.

## 2021-05-09 NOTE — Telephone Encounter (Signed)
OV notes faxed  

## 2021-05-10 DIAGNOSIS — E1165 Type 2 diabetes mellitus with hyperglycemia: Secondary | ICD-10-CM | POA: Diagnosis not present

## 2021-05-10 DIAGNOSIS — G6181 Chronic inflammatory demyelinating polyneuritis: Secondary | ICD-10-CM | POA: Diagnosis not present

## 2021-05-10 DIAGNOSIS — J9611 Chronic respiratory failure with hypoxia: Secondary | ICD-10-CM | POA: Diagnosis not present

## 2021-05-10 DIAGNOSIS — K580 Irritable bowel syndrome with diarrhea: Secondary | ICD-10-CM | POA: Diagnosis not present

## 2021-05-10 DIAGNOSIS — I10 Essential (primary) hypertension: Secondary | ICD-10-CM | POA: Diagnosis not present

## 2021-05-10 DIAGNOSIS — J449 Chronic obstructive pulmonary disease, unspecified: Secondary | ICD-10-CM | POA: Diagnosis not present

## 2021-05-16 DIAGNOSIS — E1165 Type 2 diabetes mellitus with hyperglycemia: Secondary | ICD-10-CM | POA: Diagnosis not present

## 2021-05-16 DIAGNOSIS — G6181 Chronic inflammatory demyelinating polyneuritis: Secondary | ICD-10-CM | POA: Diagnosis not present

## 2021-05-16 DIAGNOSIS — I10 Essential (primary) hypertension: Secondary | ICD-10-CM | POA: Diagnosis not present

## 2021-05-18 DIAGNOSIS — J9611 Chronic respiratory failure with hypoxia: Secondary | ICD-10-CM | POA: Diagnosis not present

## 2021-05-18 DIAGNOSIS — G6181 Chronic inflammatory demyelinating polyneuritis: Secondary | ICD-10-CM | POA: Diagnosis not present

## 2021-05-18 DIAGNOSIS — J449 Chronic obstructive pulmonary disease, unspecified: Secondary | ICD-10-CM | POA: Diagnosis not present

## 2021-05-18 DIAGNOSIS — I10 Essential (primary) hypertension: Secondary | ICD-10-CM | POA: Diagnosis not present

## 2021-05-18 DIAGNOSIS — E1165 Type 2 diabetes mellitus with hyperglycemia: Secondary | ICD-10-CM | POA: Diagnosis not present

## 2021-05-18 DIAGNOSIS — K580 Irritable bowel syndrome with diarrhea: Secondary | ICD-10-CM | POA: Diagnosis not present

## 2021-05-22 ENCOUNTER — Other Ambulatory Visit: Payer: Self-pay | Admitting: Family Medicine

## 2021-05-23 ENCOUNTER — Other Ambulatory Visit: Payer: Self-pay | Admitting: Family Medicine

## 2021-05-26 ENCOUNTER — Other Ambulatory Visit: Payer: Self-pay | Admitting: Family Medicine

## 2021-05-27 ENCOUNTER — Telehealth: Payer: Self-pay | Admitting: *Deleted

## 2021-05-27 NOTE — Telephone Encounter (Signed)
Pt declined services today due to not feeling well today. She will be reassessed next week for PT . Just wanted to make Dr. Madilyn Fireman aware of this.

## 2021-06-01 DIAGNOSIS — J9611 Chronic respiratory failure with hypoxia: Secondary | ICD-10-CM | POA: Diagnosis not present

## 2021-06-01 DIAGNOSIS — K580 Irritable bowel syndrome with diarrhea: Secondary | ICD-10-CM | POA: Diagnosis not present

## 2021-06-01 DIAGNOSIS — G6181 Chronic inflammatory demyelinating polyneuritis: Secondary | ICD-10-CM | POA: Diagnosis not present

## 2021-06-01 DIAGNOSIS — E1165 Type 2 diabetes mellitus with hyperglycemia: Secondary | ICD-10-CM | POA: Diagnosis not present

## 2021-06-01 DIAGNOSIS — I10 Essential (primary) hypertension: Secondary | ICD-10-CM | POA: Diagnosis not present

## 2021-06-01 DIAGNOSIS — J449 Chronic obstructive pulmonary disease, unspecified: Secondary | ICD-10-CM | POA: Diagnosis not present

## 2021-06-03 ENCOUNTER — Other Ambulatory Visit: Payer: Self-pay | Admitting: Family Medicine

## 2021-06-04 DIAGNOSIS — H3530 Unspecified macular degeneration: Secondary | ICD-10-CM | POA: Diagnosis not present

## 2021-06-04 DIAGNOSIS — E538 Deficiency of other specified B group vitamins: Secondary | ICD-10-CM | POA: Diagnosis not present

## 2021-06-04 DIAGNOSIS — K580 Irritable bowel syndrome with diarrhea: Secondary | ICD-10-CM | POA: Diagnosis not present

## 2021-06-04 DIAGNOSIS — Z7951 Long term (current) use of inhaled steroids: Secondary | ICD-10-CM | POA: Diagnosis not present

## 2021-06-04 DIAGNOSIS — G6181 Chronic inflammatory demyelinating polyneuritis: Secondary | ICD-10-CM | POA: Diagnosis not present

## 2021-06-04 DIAGNOSIS — J449 Chronic obstructive pulmonary disease, unspecified: Secondary | ICD-10-CM | POA: Diagnosis not present

## 2021-06-04 DIAGNOSIS — I10 Essential (primary) hypertension: Secondary | ICD-10-CM | POA: Diagnosis not present

## 2021-06-04 DIAGNOSIS — Z9981 Dependence on supplemental oxygen: Secondary | ICD-10-CM | POA: Diagnosis not present

## 2021-06-04 DIAGNOSIS — F32A Depression, unspecified: Secondary | ICD-10-CM | POA: Diagnosis not present

## 2021-06-04 DIAGNOSIS — D638 Anemia in other chronic diseases classified elsewhere: Secondary | ICD-10-CM | POA: Diagnosis not present

## 2021-06-04 DIAGNOSIS — G4733 Obstructive sleep apnea (adult) (pediatric): Secondary | ICD-10-CM | POA: Diagnosis not present

## 2021-06-04 DIAGNOSIS — Z9181 History of falling: Secondary | ICD-10-CM | POA: Diagnosis not present

## 2021-06-04 DIAGNOSIS — E1165 Type 2 diabetes mellitus with hyperglycemia: Secondary | ICD-10-CM | POA: Diagnosis not present

## 2021-06-04 DIAGNOSIS — Z7902 Long term (current) use of antithrombotics/antiplatelets: Secondary | ICD-10-CM | POA: Diagnosis not present

## 2021-06-04 DIAGNOSIS — R339 Retention of urine, unspecified: Secondary | ICD-10-CM | POA: Diagnosis not present

## 2021-06-04 DIAGNOSIS — M1990 Unspecified osteoarthritis, unspecified site: Secondary | ICD-10-CM | POA: Diagnosis not present

## 2021-06-04 DIAGNOSIS — E1136 Type 2 diabetes mellitus with diabetic cataract: Secondary | ICD-10-CM | POA: Diagnosis not present

## 2021-06-04 DIAGNOSIS — J9611 Chronic respiratory failure with hypoxia: Secondary | ICD-10-CM | POA: Diagnosis not present

## 2021-06-04 DIAGNOSIS — E7800 Pure hypercholesterolemia, unspecified: Secondary | ICD-10-CM | POA: Diagnosis not present

## 2021-06-04 DIAGNOSIS — E039 Hypothyroidism, unspecified: Secondary | ICD-10-CM | POA: Diagnosis not present

## 2021-06-04 DIAGNOSIS — K219 Gastro-esophageal reflux disease without esophagitis: Secondary | ICD-10-CM | POA: Diagnosis not present

## 2021-06-04 DIAGNOSIS — G600 Hereditary motor and sensory neuropathy: Secondary | ICD-10-CM | POA: Diagnosis not present

## 2021-06-10 DIAGNOSIS — J449 Chronic obstructive pulmonary disease, unspecified: Secondary | ICD-10-CM | POA: Diagnosis not present

## 2021-06-10 DIAGNOSIS — K580 Irritable bowel syndrome with diarrhea: Secondary | ICD-10-CM | POA: Diagnosis not present

## 2021-06-10 DIAGNOSIS — I10 Essential (primary) hypertension: Secondary | ICD-10-CM | POA: Diagnosis not present

## 2021-06-10 DIAGNOSIS — E1165 Type 2 diabetes mellitus with hyperglycemia: Secondary | ICD-10-CM | POA: Diagnosis not present

## 2021-06-10 DIAGNOSIS — J9611 Chronic respiratory failure with hypoxia: Secondary | ICD-10-CM | POA: Diagnosis not present

## 2021-06-10 DIAGNOSIS — G6181 Chronic inflammatory demyelinating polyneuritis: Secondary | ICD-10-CM | POA: Diagnosis not present

## 2021-06-13 ENCOUNTER — Ambulatory Visit (INDEPENDENT_AMBULATORY_CARE_PROVIDER_SITE_OTHER): Payer: Medicare Other | Admitting: Family Medicine

## 2021-06-13 ENCOUNTER — Telehealth: Payer: Self-pay | Admitting: Family Medicine

## 2021-06-13 ENCOUNTER — Encounter: Payer: Self-pay | Admitting: Family Medicine

## 2021-06-13 VITALS — BP 93/62 | HR 55 | Ht 67.0 in | Wt 166.0 lb

## 2021-06-13 DIAGNOSIS — J449 Chronic obstructive pulmonary disease, unspecified: Secondary | ICD-10-CM

## 2021-06-13 DIAGNOSIS — M21371 Foot drop, right foot: Secondary | ICD-10-CM

## 2021-06-13 DIAGNOSIS — Z23 Encounter for immunization: Secondary | ICD-10-CM

## 2021-06-13 DIAGNOSIS — R42 Dizziness and giddiness: Secondary | ICD-10-CM | POA: Insufficient documentation

## 2021-06-13 DIAGNOSIS — E039 Hypothyroidism, unspecified: Secondary | ICD-10-CM

## 2021-06-13 DIAGNOSIS — E1140 Type 2 diabetes mellitus with diabetic neuropathy, unspecified: Secondary | ICD-10-CM

## 2021-06-13 DIAGNOSIS — N1832 Chronic kidney disease, stage 3b: Secondary | ICD-10-CM

## 2021-06-13 DIAGNOSIS — E1169 Type 2 diabetes mellitus with other specified complication: Secondary | ICD-10-CM

## 2021-06-13 DIAGNOSIS — F331 Major depressive disorder, recurrent, moderate: Secondary | ICD-10-CM

## 2021-06-13 DIAGNOSIS — I10 Essential (primary) hypertension: Secondary | ICD-10-CM | POA: Diagnosis not present

## 2021-06-13 DIAGNOSIS — M2550 Pain in unspecified joint: Secondary | ICD-10-CM | POA: Diagnosis not present

## 2021-06-13 DIAGNOSIS — M791 Myalgia, unspecified site: Secondary | ICD-10-CM | POA: Diagnosis not present

## 2021-06-13 DIAGNOSIS — E785 Hyperlipidemia, unspecified: Secondary | ICD-10-CM

## 2021-06-13 DIAGNOSIS — M21372 Foot drop, left foot: Secondary | ICD-10-CM | POA: Insufficient documentation

## 2021-06-13 LAB — POCT GLYCOSYLATED HEMOGLOBIN (HGB A1C): Hemoglobin A1C: 5.9 % — AB (ref 4.0–5.6)

## 2021-06-13 MED ORDER — MECLIZINE HCL 25 MG PO TABS: 25.0000 mg | ORAL_TABLET | Freq: Three times a day (TID) | ORAL | 2 refills | Status: DC | PRN

## 2021-06-13 MED ORDER — FLUOXETINE HCL 20 MG PO CAPS: ORAL_CAPSULE | ORAL | 0 refills | Status: DC

## 2021-06-13 NOTE — Telephone Encounter (Signed)
Pt advised. She wanted to know how long she should hold the medication for?  Will fwd to pcp for recommendations.

## 2021-06-13 NOTE — Patient Instructions (Addendum)
Please hold your metformin.   Please drop down to 40 mg of fluoxetine daily for 10 days got a change of fluoxetine to the 20 mg tabs and do a taper.  Also this prescription over to your pharmacy.  Once you are done with the taper then we will switch to duloxetine which is generic for Cymbalta.

## 2021-06-13 NOTE — Progress Notes (Signed)
Established Patient Office Visit  Subjective:  Patient ID: [name], female    DOB: Oct 26, 1945  Age: 75 y.o. MRN: [id number]  CC:  Chief Complaint  Patient presents with   Diabetes    HPI [name] presents for   Diabetes - no hypoglycemic events. No wounds or sores that are not healing well. No increased thirst or urination. Checking glucose at home. Taking medications as prescribed without any side effects.  Follow-up hypothyroidism-last TSH was 0.7 which is a little overly suppressed.  I changed her dose to 112 mcg daily.  She is due to recheck those levels.  She feels tired all the time so really cannot tell if she feels any different on it or not.  He would really like to discuss her pain levels.  A lot of days she wakes up in severe pain to the point that it brings tears to her eyes.  She notices it is always worse when the weather changes. She mostly uses Tylenol but doesn't seem to be working anymore.  She says excedrin works well but not sure if she should be taking it. She is on ASA and Plavix.  She says tramadol doesn't work for her.  She is on gabapentin as well.   Her dizziness is more chronic and she takes her meclizine 3 times a day pretty regularly she notices a big difference if she does not take it it really does seem to help.  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   Spinal stenosis, lumbar region, without neurogenic claudication    Thoracic spondylosis without myelopathy    Thyroid disease    hypo   Tobacco dependence    Unspecified hereditary and idiopathic peripheral neuropathy     Past Surgical History:  Procedure Laterality Date   ANKLE RECONSTRUCTION     left ankle, plate and pins    bilateral L3 medial branch block     CHOLECYSTECTOMY     fibroid tumor removal     fluoroscopic L5 dorsal medial branch bloc     RENAL ARTERY STENT  11/28/2010   left   REPLACEMENT TOTAL KNEE      Family History  Problem Relation Age of Onset   Stroke Mother    Heart disease Father 22       heart attack   Hypertension Father    Cancer Father        lung   Diabetes Paternal Aunt        insulin dependent    Social History   Socioeconomic History   Marital status: Widowed    Spouse name: Not on file   Number of children: 2   Years of education: 13   Highest  education level: Some college, no degree  Occupational History   Not on file  Tobacco Use   Smoking status: Every Day    Packs/day: 0.50    Years: 49.00    Pack years: 24.50    Types: Cigarettes   Smokeless tobacco: Never  Vaping Use   Vaping Use: Never used  Substance and Sexual Activity   Alcohol use: No    Alcohol/week: 0.0 standard drinks   Drug use: No   Sexual activity: Not Currently  Other Topics Concern   Not on file  Social History Narrative   Lives alone but her daughter lives close by and helps when she can. She has a cat. She is not able to read any more due to macular degeneration but she does listen to audio books.    Social Determinants of Health   Financial Resource Strain: Low Risk    Difficulty of Paying Living Expenses: Not hard at all  Food Insecurity: No Food Insecurity   Worried About Charity fundraiser in the Last Year: Never true   [place] in the Last Year: Never true  Transportation Needs: No Transportation Needs   Lack of  Transportation (Medical): No   Lack of Transportation (Non-Medical): No  Physical Activity: Sufficiently Active   Days of Exercise per Week: 4 days   Minutes of Exercise per Session: 60 min  Stress: No Stress Concern Present   Feeling of Stress : Not at all  Social Connections: Socially Isolated   Frequency of Communication with Friends and Family: More than three times a week   Frequency of Social Gatherings with Friends and Family: Twice a week   Attends Religious Services: Never   Marine scientist or Organizations: Patient refused   Attends Archivist Meetings: Never   Marital Status: Widowed  Human resources officer Violence: Not At Risk   Fear of Current or Ex-Partner: No   Emotionally Abused: No   Physically Abused: No   Sexually Abused: No    Outpatient Medications Prior to Visit  Medication Sig Dispense Refill   albuterol (ACCUNEB) 1.25 MG/3ML nebulizer solution Take 3 mLs (1.25 mg total) by nebulization every 6 (six) hours as needed for wheezing or shortness of breath. 75 mL 12   alendronate (FOSAMAX) 70 MG tablet TAKE 1 TABLET BY MOUTH EVERY 7 DAYS. TAKE IN THE MORNING WITH A FULL GLASS OF WATER ON AN EMPTY STOMACH. DO NOT TAKE OTHER FOOD OR DRINK OR LIE DOWN 12 tablet 1   AMBULATORY NON FORMULARY MEDICATION Medication Name: Nebulizer machine with equipment.  Diagnosis COPD.  Patient is primarily homebound.  Is faxed to [facility]. She is an established patient. 1 Units 0   AMBULATORY NON FORMULARY MEDICATION Medication Name: Tub Transfer Bench 1 each 0   aspirin EC 81 MG tablet Take 81 mg by mouth daily.     atorvastatin (LIPITOR) 80 MG tablet Take 1 tablet (80 mg total) by mouth at bedtime. 90 tablet 0   blood glucose meter kit and supplies KIT Dispense based on patient and insurance preference. Use up to four times daily as directed Dx E11.40 1 each 0   calcium carbonate (OS-CAL) 600 MG tablet Take by mouth.     clopidogrel (PLAVIX) 75 MG tablet TAKE ONE TABLET BY  MOUTH EVERY DAY 90 tablet 11   dicyclomine (BENTYL) 20 MG tablet TAKE ONE TABLET BY MOUTH THREE TIMES DAILY AS NEEDED FOR SPASMS 90 tablet 2   diphenoxylate-atropine (LOMOTIL)  2.5-0.025 MG tablet TAKE ONE OR TWO TABLETS BY MOUTH TWICE DAILY AS NEEDED FOR DIARRHEA OR LOOSE STOOLS 60 tablet 3   furosemide (LASIX) 40 MG tablet Take 1 tablet (40 mg total) by mouth every other day. 45 tablet 1   gabapentin (NEURONTIN) 100 MG capsule Take 1 capsule (100 mg total) by mouth 3 (three) times daily AND 2 CAPSULES at bedtime. 150 capsule 1   GAMUNEX-C 20 GM/200ML SOLN      lansoprazole (PREVACID) 30 MG capsule Take 1 capsule (30 mg total) by mouth daily at 12 noon. 90 capsule 0   levalbuterol (XOPENEX) 0.63 MG/3ML nebulizer solution Take 3 mLs (0.63 mg total) by nebulization every 8 (eight) hours as needed for wheezing or shortness of breath. 60 mL 3   levothyroxine (SYNTHROID) 112 MCG tablet Take 1 tablet (112 mcg total) by mouth daily. 30 tablet 1   lidocaine (XYLOCAINE) 5 % ointment Apply 1 application topically daily. 2 hours before skin procedure. 35.44 g 11   metoprolol tartrate (LOPRESSOR) 25 MG tablet Take 1 tablet (25 mg total) by mouth 2 (two) times daily. 60 tablet 2   Multiple Vitamins-Minerals (PRESERVISION AREDS 2) CAPS      mycophenolate (CELLCEPT) 500 MG tablet Take by mouth.  5   nitroGLYCERIN (NITROSTAT) 0.4 MG SL tablet Place 1 tablet (0.4 mg total) under the tongue every 5 (five) minutes as needed. 10 tablet 2   potassium chloride SA (KLOR-CON) 20 MEQ tablet Take 1 tablet (20 mEq total) by mouth daily. 30 tablet 3   TRELEGY ELLIPTA 100-62.5-25 MCG/INH AEPB Inhale 1 puff into the lungs every morning. 60 each 5   zolpidem (AMBIEN) 5 MG tablet Take 1/2-1 tablet (2.5-5 mg total) by mouth at bedtime. 45 tablet 0   FLUoxetine (PROZAC) 40 MG capsule Take 2 capsules (80 mg total) by mouth daily. 180 capsule 2   hydrocortisone (ANUSOL-HC) 2.5 % rectal cream Place rectally 2 (two) times daily.      Lancets MISC by Does not apply route.     meclizine (ANTIVERT) 25 MG tablet Take 1 tablet (25 mg total) by mouth 3 (three) times daily as needed for dizziness. 30 tablet 6   metFORMIN (GLUCOPHAGE) 500 MG tablet Take 1 tablet (500 mg total) by mouth daily with breakfast. 1 tablet 0   PRODIGY NO CODING BLOOD GLUC test strip USE AS DIRECTED 100 each 0   promethazine (PHENERGAN) 25 MG tablet Take 1 tablet (25 mg total) by mouth every 8 (eight) hours as needed for nausea or vomiting. 20 tablet 0   AMBULATORY NON FORMULARY MEDICATION Medication Name: Bilateral knee braces for weakness,foot drop.Medication Name: Bilateral knee braces for weakness,foot drop. Restore Phone: [phone] Fax : [phone] (Patient not taking: Reported on 06/13/2021) 2 each 0   levothyroxine (SYNTHROID) 125 MCG tablet Take 1 tablet (125 mcg total) by mouth daily. 30 tablet 2   No facility-administered medications prior to visit.    Allergies  Allergen Reactions   Varenicline Nausea And Vomiting    ROS Review of Systems    Objective:    Physical Exam  BP 93/62   Pulse (!) 55   Ht $R'5\' 7"'Yy$  (1.702 m)   Wt 166 lb (75.3 kg)   SpO2 100%   BMI 26.00 kg/m  Wt Readings from Last 3 Encounters:  06/13/21 166 lb (75.3 kg)  10/22/20 184 lb (83.5 kg)  09/17/20 184 lb 0.6 oz (83.5 kg)     Health Maintenance Due  Topic Date Due  Zoster Vaccines- Shingrix (1 of 2) Never done   URINE MICROALBUMIN  12/05/2017   COVID-19 Vaccine (2 - Janssen risk series) 11/28/2019   OPHTHALMOLOGY EXAM  03/10/2021    There are no preventive care reminders to display for this patient.  Lab Results  Component Value Date   TSH 0.70 02/10/2021   Lab Results  Component Value Date   WBC 7.9 11/29/2020   HGB 10.7 (L) 11/29/2020   HCT 33.2 (L) 11/29/2020   MCV 88.8 11/29/2020   PLT 315 11/29/2020   Lab Results  Component Value Date   NA 136 02/10/2021   K 4.1 02/10/2021   CO2 27 02/10/2021   GLUCOSE 157 (H) 02/10/2021   BUN  10 02/10/2021   CREATININE 0.99 (H) 02/10/2021   BILITOT 0.7 11/29/2020   ALKPHOS 51 09/01/2015   AST 10 11/29/2020   ALT 3 (L) 11/29/2020   PROT 7.7 11/29/2020   ALBUMIN 3.4 (L) 09/01/2015   CALCIUM 9.7 02/10/2021   Lab Results  Component Value Date   CHOL 128 09/01/2015   Lab Results  Component Value Date   HDL 42 (L) 09/01/2015   Lab Results  Component Value Date   LDLCALC 61 09/01/2015   Lab Results  Component Value Date   TRIG 124 09/01/2015   Lab Results  Component Value Date   CHOLHDL 3.0 09/01/2015   Lab Results  Component Value Date   HGBA1C 5.9 (A) 06/13/2021      Assessment & Plan:   Problem List Items Addressed This Visit       Respiratory   COPD (chronic obstructive pulmonary disease) with chronic bronchitis ([place])    Unfortunately she is still smoking but has actually been doing well off of her oxygen since we took her off some of her sedating medications.        Endocrine   Hypothyroidism   Relevant Orders   POCT glycosylated hemoglobin (Hb A1C) (Completed)   Lipid panel   TSH   BASIC METABOLIC PANEL WITH GFR   Hyperlipidemia associated with type 2 diabetes mellitus ([place])    Due to recheck lipid levels.      Diabetes mellitus with neuropathy ([place]) - Primary    She is doing fantastic overall.  In fact organ to go ahead and hold the metformin from now.  We will go ahead and move removed from the medication list she is currently only taking 500 mg once a day.      Relevant Orders   POCT glycosylated hemoglobin (Hb A1C) (Completed)   Lipid panel   TSH   BASIC METABOLIC PANEL WITH GFR     Genitourinary   Chronic kidney disease (CKD) stage G3b/A2, moderately decreased glomerular filtration rate (GFR) between 30-44 mL/min/1.73 square meter and albuminuria creatinine ratio between 30-299 mg/g (HCC)   Relevant Orders   POCT glycosylated hemoglobin (Hb A1C) (Completed)   Lipid panel   TSH   BASIC METABOLIC PANEL WITH GFR     Other    Vertigo    She actually takes the meclizine pretty regularly she says it makes a big difference in how she feels.  She is working with a physical therapist right now being able to stand and work on her balance.      Relevant Medications   meclizine (ANTIVERT) 25 MG tablet   Major depressive disorder, recurrent episode ([place])    Please hold your metformin.   Please drop down to 40 mg of fluoxetine daily for 10 days  got a change of fluoxetine to the 20 mg tabs and do a taper.  Also this prescription over to your pharmacy.  Once you are done with the taper then we will switch to duloxetine which is generic for Cymbalta.      Relevant Medications   FLUoxetine (PROZAC) 20 MG capsule   Bilateral foot-drop    Recommend that she has customized braces for her legs for her bilateral foot drop to reduce her risk of falls and enable increased mobility. She has a dx of CIDP.        Other Visit Diagnoses     Need for immunization against influenza       Relevant Orders   Flu Vaccine QUAD High Dose(Flu[facility]) (Completed)   Polyarthralgia       Relevant Orders   CK (Creatine Kinase)   Sedimentation rate   BASIC METABOLIC PANEL WITH GFR   Myalgia       Relevant Orders   CK (Creatine Kinase)   BASIC METABOLIC PANEL WITH GFR      Hypotension - Since her blood pressure still pretty low today again I have her try to hold the metoprolol and see how she does without it.  Meds ordered this encounter  Medications   meclizine (ANTIVERT) 25 MG tablet    Sig: Take 1 tablet (25 mg total) by mouth 3 (three) times daily as needed for dizziness.    Dispense:  90 tablet    Refill:  2   FLUoxetine (PROZAC) 20 MG capsule    Sig: Take 3 capsules (60 mg total) by mouth daily for 10 days, THEN 2 capsules (40 mg total) daily for 10 days, THEN 1 capsule (20 mg total) daily for 10 days.    Dispense:  60 capsule    Refill:  0     Follow-up: Return in about 3 months (around 09/13/2021) for Diabetes follow-up.     [name], MD

## 2021-06-13 NOTE — Assessment & Plan Note (Signed)
Please hold your metformin.   Please drop down to 40 mg of fluoxetine daily for 10 days got a change of fluoxetine to the 20 mg tabs and do a taper.  Also this prescription over to your pharmacy.  Once you are done with the taper then we will switch to duloxetine which is generic for Cymbalta.

## 2021-06-13 NOTE — Telephone Encounter (Signed)
Until the nurse who comes out every 2 weeks comes out and can check her BP and then send it to use to review.

## 2021-06-13 NOTE — Progress Notes (Signed)
Spoke w/Catilin and ask her about why Ms. Bakers braces have not been delivered. She advised that her note is still not Medicare complaint it has to state her need for bilateral foot orthosis from the note that was done on 02/10/2021. Once this is done they will deliver her orthotics.   Fax to [phone]

## 2021-06-13 NOTE — Assessment & Plan Note (Signed)
Unfortunately she is still smoking but has actually been doing well off of her oxygen since we took her off some of her sedating medications.

## 2021-06-13 NOTE — Telephone Encounter (Signed)
Pt informed she will let the nurse know .

## 2021-06-13 NOTE — Telephone Encounter (Signed)
Please have her hold the metoprolol and not take that as well since her blood pressure still on the lower end.

## 2021-06-13 NOTE — Assessment & Plan Note (Signed)
She actually takes the meclizine pretty regularly she says it makes a big difference in how she feels.  She is working with a physical therapist right now being able to stand and work on her balance.

## 2021-06-13 NOTE — Assessment & Plan Note (Signed)
Recommend that she has customized braces for her legs for her bilateral foot drop to reduce her risk of falls and enable increased mobility. She has a dx of CIDP.

## 2021-06-13 NOTE — Assessment & Plan Note (Signed)
Due to recheck lipid levels. 

## 2021-06-13 NOTE — Assessment & Plan Note (Signed)
She is doing fantastic overall.  In fact organ to go ahead and hold the metformin from now.  We will go ahead and move removed from the medication list she is currently only taking 500 mg once a day.

## 2021-06-14 DIAGNOSIS — J449 Chronic obstructive pulmonary disease, unspecified: Secondary | ICD-10-CM | POA: Diagnosis not present

## 2021-06-14 DIAGNOSIS — K580 Irritable bowel syndrome with diarrhea: Secondary | ICD-10-CM | POA: Diagnosis not present

## 2021-06-14 DIAGNOSIS — J9611 Chronic respiratory failure with hypoxia: Secondary | ICD-10-CM | POA: Diagnosis not present

## 2021-06-14 DIAGNOSIS — E1165 Type 2 diabetes mellitus with hyperglycemia: Secondary | ICD-10-CM | POA: Diagnosis not present

## 2021-06-14 DIAGNOSIS — I10 Essential (primary) hypertension: Secondary | ICD-10-CM | POA: Diagnosis not present

## 2021-06-14 DIAGNOSIS — G6181 Chronic inflammatory demyelinating polyneuritis: Secondary | ICD-10-CM | POA: Diagnosis not present

## 2021-06-14 NOTE — Progress Notes (Signed)
Patient: Cholesterol overall looks good.  Thyroid looks good.  Muscle enzyme is normal so no sign of excess muscle breakdown.  Though her inflammatory rate is extremely high.  Some get a call the lab and see if we can add a couple of extra labs.  If they cannot then I may need her to come back and have those drawn.  Kidney function is stable.  Sodium is a little borderline similar to what its been in the past but not in a worrisome range but I do want a keep an eye on this.  Please call lab and see if we can add an anti-CCP, rheumatoid factor, ANA.

## 2021-06-21 DIAGNOSIS — J9611 Chronic respiratory failure with hypoxia: Secondary | ICD-10-CM | POA: Diagnosis not present

## 2021-06-21 DIAGNOSIS — E1165 Type 2 diabetes mellitus with hyperglycemia: Secondary | ICD-10-CM | POA: Diagnosis not present

## 2021-06-21 DIAGNOSIS — J449 Chronic obstructive pulmonary disease, unspecified: Secondary | ICD-10-CM | POA: Diagnosis not present

## 2021-06-21 DIAGNOSIS — I10 Essential (primary) hypertension: Secondary | ICD-10-CM | POA: Diagnosis not present

## 2021-06-21 DIAGNOSIS — K580 Irritable bowel syndrome with diarrhea: Secondary | ICD-10-CM | POA: Diagnosis not present

## 2021-06-21 DIAGNOSIS — G6181 Chronic inflammatory demyelinating polyneuritis: Secondary | ICD-10-CM | POA: Diagnosis not present

## 2021-06-21 LAB — RHEUMATOID FACTOR: Rheumatoid fact SerPl-aCnc: <14 IU/mL (ref <14)

## 2021-06-21 LAB — LIPID PANEL
Cholesterol: 98 mg/dL (ref <200)
HDL: 38 mg/dL — ABNORMAL LOW (ref >=50)
LDL Cholesterol (Calc): 37 mg/dL (calc)
Non-HDL Cholesterol (Calc): 60 mg/dL (calc) (ref <130)
Total CHOL/HDL Ratio: 2.6 (calc) (ref <5.0)
Triglycerides: 150 mg/dL — ABNORMAL HIGH (ref <150)

## 2021-06-21 LAB — CK: Total CK: 29 U/L (ref 29–143)

## 2021-06-21 LAB — BASIC METABOLIC PANEL WITH GFR
BUN: 16 mg/dL (ref 7–25)
CO2: 25 mmol/L (ref 20–32)
Calcium: 9.5 mg/dL (ref 8.6–10.4)
Chloride: 97 mmol/L — ABNORMAL LOW (ref 98–110)
Creat: 1 mg/dL (ref 0.60–1.00)
Glucose, Bld: 173 mg/dL — ABNORMAL HIGH (ref 65–139)
Potassium: 4.4 mmol/L (ref 3.5–5.3)
Sodium: 133 mmol/L — ABNORMAL LOW (ref 135–146)
eGFR: 58 mL/min/{1.73_m2} — ABNORMAL LOW (ref >=60)

## 2021-06-21 LAB — ANTI-NUCLEAR AB-TITER (ANA TITER)
ANA TITER: 1:80 {titer} — ABNORMAL HIGH
ANA Titer 1: 1:40 {titer} — ABNORMAL HIGH

## 2021-06-21 LAB — SEDIMENTATION RATE: Sed Rate: >130 mm/h — ABNORMAL HIGH (ref 0–30)

## 2021-06-21 LAB — ANA: Anti Nuclear Antibody (ANA): POSITIVE — AB

## 2021-06-21 LAB — TSH: TSH: 0.62 mIU/L (ref 0.40–4.50)

## 2021-06-21 LAB — CYCLIC CITRUL PEPTIDE ANTIBODY, IGG: Cyclic Citrullin Peptide Ab: <16 UNITS

## 2021-06-21 NOTE — Progress Notes (Signed)
Call patient: Yes for rheumatoid arthritis are normal.  Still awaiting ANA.

## 2021-06-22 ENCOUNTER — Other Ambulatory Visit: Payer: Self-pay | Admitting: *Deleted

## 2021-06-22 DIAGNOSIS — R768 Other specified abnormal immunological findings in serum: Secondary | ICD-10-CM

## 2021-06-22 NOTE — Progress Notes (Signed)
Call patient: ANA is elevated.  How would she feel about seeing a rheumatologist?

## 2021-06-29 DIAGNOSIS — G6181 Chronic inflammatory demyelinating polyneuritis: Secondary | ICD-10-CM | POA: Diagnosis not present

## 2021-06-29 DIAGNOSIS — J9611 Chronic respiratory failure with hypoxia: Secondary | ICD-10-CM | POA: Diagnosis not present

## 2021-06-29 DIAGNOSIS — I10 Essential (primary) hypertension: Secondary | ICD-10-CM | POA: Diagnosis not present

## 2021-06-29 DIAGNOSIS — J449 Chronic obstructive pulmonary disease, unspecified: Secondary | ICD-10-CM | POA: Diagnosis not present

## 2021-06-29 DIAGNOSIS — K580 Irritable bowel syndrome with diarrhea: Secondary | ICD-10-CM | POA: Diagnosis not present

## 2021-06-29 DIAGNOSIS — E1165 Type 2 diabetes mellitus with hyperglycemia: Secondary | ICD-10-CM | POA: Diagnosis not present

## 2021-07-04 DIAGNOSIS — Z7951 Long term (current) use of inhaled steroids: Secondary | ICD-10-CM | POA: Diagnosis not present

## 2021-07-04 DIAGNOSIS — F32A Depression, unspecified: Secondary | ICD-10-CM | POA: Diagnosis not present

## 2021-07-04 DIAGNOSIS — E1136 Type 2 diabetes mellitus with diabetic cataract: Secondary | ICD-10-CM | POA: Diagnosis not present

## 2021-07-04 DIAGNOSIS — G4733 Obstructive sleep apnea (adult) (pediatric): Secondary | ICD-10-CM | POA: Diagnosis not present

## 2021-07-04 DIAGNOSIS — E7800 Pure hypercholesterolemia, unspecified: Secondary | ICD-10-CM | POA: Diagnosis not present

## 2021-07-04 DIAGNOSIS — G600 Hereditary motor and sensory neuropathy: Secondary | ICD-10-CM | POA: Diagnosis not present

## 2021-07-04 DIAGNOSIS — D638 Anemia in other chronic diseases classified elsewhere: Secondary | ICD-10-CM | POA: Diagnosis not present

## 2021-07-04 DIAGNOSIS — G6181 Chronic inflammatory demyelinating polyneuritis: Secondary | ICD-10-CM | POA: Diagnosis not present

## 2021-07-04 DIAGNOSIS — E538 Deficiency of other specified B group vitamins: Secondary | ICD-10-CM | POA: Diagnosis not present

## 2021-07-04 DIAGNOSIS — Z9981 Dependence on supplemental oxygen: Secondary | ICD-10-CM | POA: Diagnosis not present

## 2021-07-04 DIAGNOSIS — J449 Chronic obstructive pulmonary disease, unspecified: Secondary | ICD-10-CM | POA: Diagnosis not present

## 2021-07-04 DIAGNOSIS — E1165 Type 2 diabetes mellitus with hyperglycemia: Secondary | ICD-10-CM | POA: Diagnosis not present

## 2021-07-04 DIAGNOSIS — Z7902 Long term (current) use of antithrombotics/antiplatelets: Secondary | ICD-10-CM | POA: Diagnosis not present

## 2021-07-04 DIAGNOSIS — M1990 Unspecified osteoarthritis, unspecified site: Secondary | ICD-10-CM | POA: Diagnosis not present

## 2021-07-04 DIAGNOSIS — Z79891 Long term (current) use of opiate analgesic: Secondary | ICD-10-CM | POA: Diagnosis not present

## 2021-07-04 DIAGNOSIS — E039 Hypothyroidism, unspecified: Secondary | ICD-10-CM | POA: Diagnosis not present

## 2021-07-04 DIAGNOSIS — I10 Essential (primary) hypertension: Secondary | ICD-10-CM | POA: Diagnosis not present

## 2021-07-04 DIAGNOSIS — J9611 Chronic respiratory failure with hypoxia: Secondary | ICD-10-CM | POA: Diagnosis not present

## 2021-07-04 DIAGNOSIS — R339 Retention of urine, unspecified: Secondary | ICD-10-CM | POA: Diagnosis not present

## 2021-07-04 DIAGNOSIS — H3530 Unspecified macular degeneration: Secondary | ICD-10-CM | POA: Diagnosis not present

## 2021-07-04 DIAGNOSIS — K580 Irritable bowel syndrome with diarrhea: Secondary | ICD-10-CM | POA: Diagnosis not present

## 2021-07-04 DIAGNOSIS — K219 Gastro-esophageal reflux disease without esophagitis: Secondary | ICD-10-CM | POA: Diagnosis not present

## 2021-07-04 DIAGNOSIS — Z9181 History of falling: Secondary | ICD-10-CM | POA: Diagnosis not present

## 2021-07-06 ENCOUNTER — Telehealth: Payer: Self-pay | Admitting: *Deleted

## 2021-07-06 DIAGNOSIS — E1165 Type 2 diabetes mellitus with hyperglycemia: Secondary | ICD-10-CM | POA: Diagnosis not present

## 2021-07-06 DIAGNOSIS — G6181 Chronic inflammatory demyelinating polyneuritis: Secondary | ICD-10-CM | POA: Diagnosis not present

## 2021-07-06 DIAGNOSIS — J9611 Chronic respiratory failure with hypoxia: Secondary | ICD-10-CM | POA: Diagnosis not present

## 2021-07-06 DIAGNOSIS — I10 Essential (primary) hypertension: Secondary | ICD-10-CM | POA: Diagnosis not present

## 2021-07-06 DIAGNOSIS — K580 Irritable bowel syndrome with diarrhea: Secondary | ICD-10-CM | POA: Diagnosis not present

## 2021-07-06 DIAGNOSIS — J449 Chronic obstructive pulmonary disease, unspecified: Secondary | ICD-10-CM | POA: Diagnosis not present

## 2021-07-06 NOTE — Telephone Encounter (Signed)
VO given for 1x week for 9 weeks for continued maintenance.

## 2021-07-07 ENCOUNTER — Other Ambulatory Visit: Payer: Self-pay | Admitting: Family Medicine

## 2021-07-15 DIAGNOSIS — I10 Essential (primary) hypertension: Secondary | ICD-10-CM | POA: Diagnosis not present

## 2021-07-15 DIAGNOSIS — K580 Irritable bowel syndrome with diarrhea: Secondary | ICD-10-CM | POA: Diagnosis not present

## 2021-07-15 DIAGNOSIS — J449 Chronic obstructive pulmonary disease, unspecified: Secondary | ICD-10-CM | POA: Diagnosis not present

## 2021-07-15 DIAGNOSIS — E1165 Type 2 diabetes mellitus with hyperglycemia: Secondary | ICD-10-CM | POA: Diagnosis not present

## 2021-07-15 DIAGNOSIS — J9611 Chronic respiratory failure with hypoxia: Secondary | ICD-10-CM | POA: Diagnosis not present

## 2021-07-15 DIAGNOSIS — G6181 Chronic inflammatory demyelinating polyneuritis: Secondary | ICD-10-CM | POA: Diagnosis not present

## 2021-07-19 ENCOUNTER — Other Ambulatory Visit: Payer: Self-pay | Admitting: Family Medicine

## 2021-07-20 DIAGNOSIS — E1165 Type 2 diabetes mellitus with hyperglycemia: Secondary | ICD-10-CM | POA: Diagnosis not present

## 2021-07-20 DIAGNOSIS — G6181 Chronic inflammatory demyelinating polyneuritis: Secondary | ICD-10-CM | POA: Diagnosis not present

## 2021-07-20 DIAGNOSIS — K580 Irritable bowel syndrome with diarrhea: Secondary | ICD-10-CM | POA: Diagnosis not present

## 2021-07-20 DIAGNOSIS — J449 Chronic obstructive pulmonary disease, unspecified: Secondary | ICD-10-CM | POA: Diagnosis not present

## 2021-07-20 DIAGNOSIS — J9611 Chronic respiratory failure with hypoxia: Secondary | ICD-10-CM | POA: Diagnosis not present

## 2021-07-20 DIAGNOSIS — I10 Essential (primary) hypertension: Secondary | ICD-10-CM | POA: Diagnosis not present

## 2021-07-21 ENCOUNTER — Other Ambulatory Visit: Payer: Self-pay | Admitting: *Deleted

## 2021-07-21 ENCOUNTER — Other Ambulatory Visit: Payer: Self-pay | Admitting: Family Medicine

## 2021-07-21 MED ORDER — LEVOTHYROXINE SODIUM 112 MCG PO TABS: 112.0000 ug | ORAL_TABLET | Freq: Every day | ORAL | 1 refills | Status: DC

## 2021-07-25 DIAGNOSIS — E1165 Type 2 diabetes mellitus with hyperglycemia: Secondary | ICD-10-CM | POA: Diagnosis not present

## 2021-07-25 DIAGNOSIS — J449 Chronic obstructive pulmonary disease, unspecified: Secondary | ICD-10-CM | POA: Diagnosis not present

## 2021-07-25 DIAGNOSIS — K580 Irritable bowel syndrome with diarrhea: Secondary | ICD-10-CM | POA: Diagnosis not present

## 2021-07-25 DIAGNOSIS — J9611 Chronic respiratory failure with hypoxia: Secondary | ICD-10-CM | POA: Diagnosis not present

## 2021-07-25 DIAGNOSIS — G6181 Chronic inflammatory demyelinating polyneuritis: Secondary | ICD-10-CM | POA: Diagnosis not present

## 2021-07-25 DIAGNOSIS — I10 Essential (primary) hypertension: Secondary | ICD-10-CM | POA: Diagnosis not present

## 2021-08-03 DIAGNOSIS — E538 Deficiency of other specified B group vitamins: Secondary | ICD-10-CM | POA: Diagnosis not present

## 2021-08-03 DIAGNOSIS — H3530 Unspecified macular degeneration: Secondary | ICD-10-CM | POA: Diagnosis not present

## 2021-08-03 DIAGNOSIS — E1165 Type 2 diabetes mellitus with hyperglycemia: Secondary | ICD-10-CM | POA: Diagnosis not present

## 2021-08-03 DIAGNOSIS — Z7902 Long term (current) use of antithrombotics/antiplatelets: Secondary | ICD-10-CM | POA: Diagnosis not present

## 2021-08-03 DIAGNOSIS — I10 Essential (primary) hypertension: Secondary | ICD-10-CM | POA: Diagnosis not present

## 2021-08-03 DIAGNOSIS — E039 Hypothyroidism, unspecified: Secondary | ICD-10-CM | POA: Diagnosis not present

## 2021-08-03 DIAGNOSIS — G600 Hereditary motor and sensory neuropathy: Secondary | ICD-10-CM | POA: Diagnosis not present

## 2021-08-03 DIAGNOSIS — K580 Irritable bowel syndrome with diarrhea: Secondary | ICD-10-CM | POA: Diagnosis not present

## 2021-08-03 DIAGNOSIS — F32A Depression, unspecified: Secondary | ICD-10-CM | POA: Diagnosis not present

## 2021-08-03 DIAGNOSIS — Z79891 Long term (current) use of opiate analgesic: Secondary | ICD-10-CM | POA: Diagnosis not present

## 2021-08-03 DIAGNOSIS — G6181 Chronic inflammatory demyelinating polyneuritis: Secondary | ICD-10-CM | POA: Diagnosis not present

## 2021-08-03 DIAGNOSIS — G4733 Obstructive sleep apnea (adult) (pediatric): Secondary | ICD-10-CM | POA: Diagnosis not present

## 2021-08-03 DIAGNOSIS — E7800 Pure hypercholesterolemia, unspecified: Secondary | ICD-10-CM | POA: Diagnosis not present

## 2021-08-03 DIAGNOSIS — E1136 Type 2 diabetes mellitus with diabetic cataract: Secondary | ICD-10-CM | POA: Diagnosis not present

## 2021-08-03 DIAGNOSIS — K219 Gastro-esophageal reflux disease without esophagitis: Secondary | ICD-10-CM | POA: Diagnosis not present

## 2021-08-03 DIAGNOSIS — R339 Retention of urine, unspecified: Secondary | ICD-10-CM | POA: Diagnosis not present

## 2021-08-03 DIAGNOSIS — J449 Chronic obstructive pulmonary disease, unspecified: Secondary | ICD-10-CM | POA: Diagnosis not present

## 2021-08-03 DIAGNOSIS — Z9981 Dependence on supplemental oxygen: Secondary | ICD-10-CM | POA: Diagnosis not present

## 2021-08-03 DIAGNOSIS — M1990 Unspecified osteoarthritis, unspecified site: Secondary | ICD-10-CM | POA: Diagnosis not present

## 2021-08-03 DIAGNOSIS — J9611 Chronic respiratory failure with hypoxia: Secondary | ICD-10-CM | POA: Diagnosis not present

## 2021-08-03 DIAGNOSIS — Z9181 History of falling: Secondary | ICD-10-CM | POA: Diagnosis not present

## 2021-08-03 DIAGNOSIS — D638 Anemia in other chronic diseases classified elsewhere: Secondary | ICD-10-CM | POA: Diagnosis not present

## 2021-08-03 DIAGNOSIS — Z7951 Long term (current) use of inhaled steroids: Secondary | ICD-10-CM | POA: Diagnosis not present

## 2021-08-05 ENCOUNTER — Other Ambulatory Visit: Payer: Self-pay | Admitting: Family Medicine

## 2021-08-09 DIAGNOSIS — J449 Chronic obstructive pulmonary disease, unspecified: Secondary | ICD-10-CM | POA: Diagnosis not present

## 2021-08-09 DIAGNOSIS — Z20822 Contact with and (suspected) exposure to covid-19: Secondary | ICD-10-CM | POA: Diagnosis not present

## 2021-08-09 DIAGNOSIS — K580 Irritable bowel syndrome with diarrhea: Secondary | ICD-10-CM | POA: Diagnosis not present

## 2021-08-09 DIAGNOSIS — G6181 Chronic inflammatory demyelinating polyneuritis: Secondary | ICD-10-CM | POA: Diagnosis not present

## 2021-08-09 DIAGNOSIS — I10 Essential (primary) hypertension: Secondary | ICD-10-CM | POA: Diagnosis not present

## 2021-08-09 DIAGNOSIS — E1165 Type 2 diabetes mellitus with hyperglycemia: Secondary | ICD-10-CM | POA: Diagnosis not present

## 2021-08-09 DIAGNOSIS — J9611 Chronic respiratory failure with hypoxia: Secondary | ICD-10-CM | POA: Diagnosis not present

## 2021-08-16 ENCOUNTER — Telehealth: Payer: Self-pay | Admitting: *Deleted

## 2021-08-16 ENCOUNTER — Other Ambulatory Visit: Payer: Self-pay

## 2021-08-16 DIAGNOSIS — I10 Essential (primary) hypertension: Secondary | ICD-10-CM | POA: Diagnosis not present

## 2021-08-16 DIAGNOSIS — E1165 Type 2 diabetes mellitus with hyperglycemia: Secondary | ICD-10-CM | POA: Diagnosis not present

## 2021-08-16 DIAGNOSIS — J9611 Chronic respiratory failure with hypoxia: Secondary | ICD-10-CM | POA: Diagnosis not present

## 2021-08-16 DIAGNOSIS — K580 Irritable bowel syndrome with diarrhea: Secondary | ICD-10-CM | POA: Diagnosis not present

## 2021-08-16 DIAGNOSIS — G6181 Chronic inflammatory demyelinating polyneuritis: Secondary | ICD-10-CM | POA: Diagnosis not present

## 2021-08-16 DIAGNOSIS — J449 Chronic obstructive pulmonary disease, unspecified: Secondary | ICD-10-CM | POA: Diagnosis not present

## 2021-08-16 MED ORDER — DULOXETINE HCL 30 MG PO CPEP: 30.0000 mg | ORAL_CAPSULE | Freq: Every day | ORAL | 0 refills | Status: DC

## 2021-08-16 MED ORDER — POTASSIUM CHLORIDE CRYS ER 20 MEQ PO TBCR: 20.0000 meq | EXTENDED_RELEASE_TABLET | Freq: Every day | ORAL | 3 refills | Status: DC

## 2021-08-16 NOTE — Telephone Encounter (Signed)
Pt called and stated that she was supposed to be weaned off of the prozac and started on cymbalta. She stated that she doesn't have either one of these now and would like to know what she should do now.   Will fwd to pcp for advice.

## 2021-08-16 NOTE — Telephone Encounter (Signed)
Okay, removed fluoxetine from medication list and send over new prescription for Cymbalta.

## 2021-08-16 NOTE — Telephone Encounter (Signed)
Spoke with pt's daughter at pt's request and provided her with the phone number for [facility].  Daughter also stated that pt needs refill of potassium  Refill sent to pharmacy.  Charyl Bigger, CMA

## 2021-08-16 NOTE — Telephone Encounter (Signed)
Pt informed of new RX.  Pt expressed understanding.  Charyl Bigger, CMA

## 2021-08-24 DIAGNOSIS — J449 Chronic obstructive pulmonary disease, unspecified: Secondary | ICD-10-CM | POA: Diagnosis not present

## 2021-08-24 DIAGNOSIS — K580 Irritable bowel syndrome with diarrhea: Secondary | ICD-10-CM | POA: Diagnosis not present

## 2021-08-24 DIAGNOSIS — J9611 Chronic respiratory failure with hypoxia: Secondary | ICD-10-CM | POA: Diagnosis not present

## 2021-08-24 DIAGNOSIS — G6181 Chronic inflammatory demyelinating polyneuritis: Secondary | ICD-10-CM | POA: Diagnosis not present

## 2021-08-24 DIAGNOSIS — E1165 Type 2 diabetes mellitus with hyperglycemia: Secondary | ICD-10-CM | POA: Diagnosis not present

## 2021-08-24 DIAGNOSIS — I10 Essential (primary) hypertension: Secondary | ICD-10-CM | POA: Diagnosis not present

## 2021-08-29 ENCOUNTER — Other Ambulatory Visit: Payer: Self-pay | Admitting: Family Medicine

## 2021-08-29 DIAGNOSIS — E1165 Type 2 diabetes mellitus with hyperglycemia: Secondary | ICD-10-CM | POA: Diagnosis not present

## 2021-08-29 DIAGNOSIS — K580 Irritable bowel syndrome with diarrhea: Secondary | ICD-10-CM | POA: Diagnosis not present

## 2021-08-29 DIAGNOSIS — I10 Essential (primary) hypertension: Secondary | ICD-10-CM | POA: Diagnosis not present

## 2021-08-29 DIAGNOSIS — G6181 Chronic inflammatory demyelinating polyneuritis: Secondary | ICD-10-CM | POA: Diagnosis not present

## 2021-08-29 DIAGNOSIS — J9611 Chronic respiratory failure with hypoxia: Secondary | ICD-10-CM | POA: Diagnosis not present

## 2021-08-29 DIAGNOSIS — J449 Chronic obstructive pulmonary disease, unspecified: Secondary | ICD-10-CM | POA: Diagnosis not present

## 2021-08-30 ENCOUNTER — Telehealth: Payer: Self-pay

## 2021-08-30 ENCOUNTER — Encounter: Payer: Self-pay | Admitting: Family Medicine

## 2021-08-30 ENCOUNTER — Telehealth (INDEPENDENT_AMBULATORY_CARE_PROVIDER_SITE_OTHER): Payer: Medicare Other | Admitting: Family Medicine

## 2021-08-30 VITALS — Resp 16 | Ht 67.0 in

## 2021-08-30 DIAGNOSIS — R110 Nausea: Secondary | ICD-10-CM | POA: Diagnosis not present

## 2021-08-30 DIAGNOSIS — K5900 Constipation, unspecified: Secondary | ICD-10-CM

## 2021-08-30 DIAGNOSIS — K1370 Unspecified lesions of oral mucosa: Secondary | ICD-10-CM

## 2021-08-30 NOTE — Progress Notes (Signed)
Virtual Visit via Telephone Note  I connected with [name] on 08/30/21 at 10:10 AM EST by telephone and verified that I am speaking with the correct person using two identifiers.   I discussed the limitations, risks, security and privacy concerns of performing an evaluation and management service by telephone and the availability of in person appointments. I also discussed with the patient that there may be a patient responsible charge related to this service. The patient expressed understanding and agreed to proceed.  Patient location: Provider loccation: In office   Subjective:    CC:   Chief Complaint  Patient presents with   Nausea    Metallic taste for 3 days. Patient states she is improving. Patient denies any other symptoms.     HPI: She complains that she has been nauseated for several days with a metallic taste in her mouth.  She has not actually vomited but has gagged a few times.  No diarrhea.  No blood in the urine or stool.  She says in fact she is actually been a little bit constipated she plans on starting some Colace today.  She says she actually feels a lot better starting last night which was after she made the appointment.  She was actually able to eat last night and felt like things settled on her stomach well.  She was around her grandson who they thought initially might of had food poisoning, but then she got sick and then the child's father also got sick so they are pretty sure was probably viral.  Is also noticed some bumps in the roof of her mouth.  She says they feel little sore and tender.  She has not had these before.  No ulcerations as far she can tell.  She has a mouthwash that she uses and says that seems to help a little bit.   Past medical history, Surgical history, Family history not pertinant except as noted below, Social history, Allergies, and medications have been entered into the medical record, reviewed, and corrections made.   Review of  Systems: No fevers, chills, night sweats, weight loss, chest pain, or shortness of breath.   Objective:    General: Speaking clearly in complete sentences without any shortness of breath.  Alert and oriented x3.  Normal judgment. No apparent acute distress.    Impression and Recommendations:    Problem List Items Addressed This Visit   None Visit Diagnoses     Nausea    -  Primary   Constipation, unspecified constipation type       Mouth lesion          Nausea-it sounds like she may have had some type of viral illness she is already feeling a lot better.  We just did discuss that if at any point she feels like she is getting worse or takes 2 steps back to just give [place] a call back.  Constipation-I think would be great for her to go ahead and start a softener but if she is still not moving her bowels after about 2 days then I would recommend starting some MiraLAX.  Bumps in the roof of her mouth-unclear etiology at this point.  We did discuss maybe doing some salt water gargles over-the-counter for the next couple days to see if they improve.  If it spreading, getting worse, or if they are ulcerating then please let us know.  No orders of the defined types were placed in this encounter.  No orders of the defined types were placed in this encounter.    I discussed the assessment and treatment plan with the patient. The patient was provided an opportunity to ask questions and all were answered. The patient agreed with the plan and demonstrated an understanding of the instructions.   The patient was advised to call back or seek an in-person evaluation if the symptoms worsen or if the condition fails to improve as anticipated.  I provided 10 minutes of non-face-to-face time during this encounter.   [name], MD

## 2021-08-30 NOTE — Telephone Encounter (Signed)
Lisabeth Pick PT with [facility] called requesting for verbal order to continue PT with patient. 1 week 9 effective 09/04/21. Nunzio Cory can be reached at [phone]. Forward to Dr, Madilyn Fireman for review.

## 2021-08-31 NOTE — Telephone Encounter (Signed)
Left detailed verbal order on [name]'s PT with [place] voicemail.

## 2021-09-02 ENCOUNTER — Telehealth (INDEPENDENT_AMBULATORY_CARE_PROVIDER_SITE_OTHER): Payer: Medicare Other | Admitting: Family Medicine

## 2021-09-02 ENCOUNTER — Encounter: Payer: Self-pay | Admitting: Family Medicine

## 2021-09-02 DIAGNOSIS — J449 Chronic obstructive pulmonary disease, unspecified: Secondary | ICD-10-CM | POA: Diagnosis not present

## 2021-09-02 DIAGNOSIS — R339 Retention of urine, unspecified: Secondary | ICD-10-CM | POA: Diagnosis not present

## 2021-09-02 DIAGNOSIS — F32A Depression, unspecified: Secondary | ICD-10-CM | POA: Diagnosis not present

## 2021-09-02 DIAGNOSIS — E039 Hypothyroidism, unspecified: Secondary | ICD-10-CM | POA: Diagnosis not present

## 2021-09-02 DIAGNOSIS — K580 Irritable bowel syndrome with diarrhea: Secondary | ICD-10-CM | POA: Diagnosis not present

## 2021-09-02 DIAGNOSIS — H3530 Unspecified macular degeneration: Secondary | ICD-10-CM | POA: Diagnosis not present

## 2021-09-02 DIAGNOSIS — E1165 Type 2 diabetes mellitus with hyperglycemia: Secondary | ICD-10-CM | POA: Diagnosis not present

## 2021-09-02 DIAGNOSIS — Z9981 Dependence on supplemental oxygen: Secondary | ICD-10-CM | POA: Diagnosis not present

## 2021-09-02 DIAGNOSIS — Z7902 Long term (current) use of antithrombotics/antiplatelets: Secondary | ICD-10-CM | POA: Diagnosis not present

## 2021-09-02 DIAGNOSIS — Z7951 Long term (current) use of inhaled steroids: Secondary | ICD-10-CM | POA: Diagnosis not present

## 2021-09-02 DIAGNOSIS — M1990 Unspecified osteoarthritis, unspecified site: Secondary | ICD-10-CM | POA: Diagnosis not present

## 2021-09-02 DIAGNOSIS — G600 Hereditary motor and sensory neuropathy: Secondary | ICD-10-CM | POA: Diagnosis not present

## 2021-09-02 DIAGNOSIS — K1370 Unspecified lesions of oral mucosa: Secondary | ICD-10-CM

## 2021-09-02 DIAGNOSIS — G6181 Chronic inflammatory demyelinating polyneuritis: Secondary | ICD-10-CM | POA: Diagnosis not present

## 2021-09-02 DIAGNOSIS — J9611 Chronic respiratory failure with hypoxia: Secondary | ICD-10-CM | POA: Diagnosis not present

## 2021-09-02 DIAGNOSIS — E538 Deficiency of other specified B group vitamins: Secondary | ICD-10-CM | POA: Diagnosis not present

## 2021-09-02 DIAGNOSIS — K219 Gastro-esophageal reflux disease without esophagitis: Secondary | ICD-10-CM | POA: Diagnosis not present

## 2021-09-02 DIAGNOSIS — E1136 Type 2 diabetes mellitus with diabetic cataract: Secondary | ICD-10-CM | POA: Diagnosis not present

## 2021-09-02 DIAGNOSIS — D638 Anemia in other chronic diseases classified elsewhere: Secondary | ICD-10-CM | POA: Diagnosis not present

## 2021-09-02 DIAGNOSIS — I10 Essential (primary) hypertension: Secondary | ICD-10-CM | POA: Diagnosis not present

## 2021-09-02 DIAGNOSIS — Z9181 History of falling: Secondary | ICD-10-CM | POA: Diagnosis not present

## 2021-09-02 DIAGNOSIS — E7800 Pure hypercholesterolemia, unspecified: Secondary | ICD-10-CM | POA: Diagnosis not present

## 2021-09-02 DIAGNOSIS — G4733 Obstructive sleep apnea (adult) (pediatric): Secondary | ICD-10-CM | POA: Diagnosis not present

## 2021-09-02 NOTE — Progress Notes (Signed)
Per Dr. Madilyn Fireman, please call in Triamcinolone dental paste 0.1% for patient to apply to roof of mouth twice a day as needed. ( 5g tube). [place] and spoke with pharmacist Edison Nasuti. Verbal order given to [place]. Edison Nasuti stated home delivery for prescription is over today and next delivery date will be on Monday 09/05/21. Patient and Dr. Madilyn Fireman advised.

## 2021-09-02 NOTE — Progress Notes (Signed)
Virtual Visit via Telephone Note  I connected with [name] on 09/02/21 at  1:40 PM EST by telephone and verified that I am speaking with the correct person using two identifiers.   I discussed the limitations, risks, security and privacy concerns of performing an evaluation and management service by telephone and the availability of in person appointments. I also discussed with the patient that there may be a patient responsible charge related to this service. The patient expressed understanding and agreed to proceed.  Patient location:at home.   Provider loccation: In office   Subjective:    CC:   Chief Complaint  Patient presents with   Follow up    Patient stated her nausea has resolved. Patient stated she still has bumps in the roof of her mouth. Patient states bumps are not painful.     HPI: Nausea - she is actually feeling much better. She started using her stool softener and helped her mover her bowels.  Appetite is far.    Mouth lesions. Has been using the salt water gargles and then diluted Listerene. No ulcers.  They are just mildly sore but not bothersome enough that she cannot eat or talk or drink.  She does not know what they actually look like she has not been able to see it herself.  She is its not tender if she rubs her tongue over it.  She says yesterday her throat was really bothering her but today it does not hurt at all.  Past medical history, Surgical history, Family history not pertinant except as noted below, Social history, Allergies, and medications have been entered into the medical record, reviewed, and corrections made.   Review of Systems: No fevers, chills, night sweats, weight loss, chest pain, or shortness of breath.   Objective:    General: Speaking clearly in complete sentences without any shortness of breath.  Alert and oriented x3.  Normal judgment. No apparent acute distress.    Impression and Recommendations:    Problem List Items  Addressed This Visit   None Visit Diagnoses     Mouth lesion    -  Primary      Lesions-unclear etiology we discussed that without me actually seeing it it is really hard to know what the best treatment would be it does not sound like it is causing a lot of discomfort or pain which is reassuring but they are also not going away she has been doing the salt water gargles and Listerine rinses and it does not seem to make a difference we discussed maybe doing a trial of a topical steroid dental paste to see if that helps.  If it makes it worse or if its not helping after 5 days and encouraged her to stop it.  Could consider thrush.  Though she says she has had thrush before and this does not feel the same.  Nausea-overall seems much better she has been able to move her bowels better so I suspect that that probably made a big difference.  Just encouraged her to stay on top of the bowels using her stool softeners she said she never had to use the MiraLAX which is great.  Not find the triamcinolone dental paste 0.1% in our formulary.  So we may have to call this into her pharmacy and request that they deliver it.  No orders of the defined types were placed in this encounter.   No orders of the defined types were placed in this  encounter.    I discussed the assessment and treatment plan with the patient. The patient was provided an opportunity to ask questions and all were answered. The patient agreed with the plan and demonstrated an understanding of the instructions.   The patient was advised to call back or seek an in-person evaluation if the symptoms worsen or if the condition fails to improve as anticipated.  I provided 12 minutes of non-face-to-face time during this encounter.   [name], MD

## 2021-09-05 DIAGNOSIS — K580 Irritable bowel syndrome with diarrhea: Secondary | ICD-10-CM | POA: Diagnosis not present

## 2021-09-05 DIAGNOSIS — J9611 Chronic respiratory failure with hypoxia: Secondary | ICD-10-CM | POA: Diagnosis not present

## 2021-09-05 DIAGNOSIS — E1165 Type 2 diabetes mellitus with hyperglycemia: Secondary | ICD-10-CM | POA: Diagnosis not present

## 2021-09-05 DIAGNOSIS — I10 Essential (primary) hypertension: Secondary | ICD-10-CM | POA: Diagnosis not present

## 2021-09-05 DIAGNOSIS — J449 Chronic obstructive pulmonary disease, unspecified: Secondary | ICD-10-CM | POA: Diagnosis not present

## 2021-09-05 DIAGNOSIS — G6181 Chronic inflammatory demyelinating polyneuritis: Secondary | ICD-10-CM | POA: Diagnosis not present

## 2021-09-07 ENCOUNTER — Telehealth: Payer: Self-pay | Admitting: Family Medicine

## 2021-09-07 DIAGNOSIS — G6181 Chronic inflammatory demyelinating polyneuritis: Secondary | ICD-10-CM | POA: Diagnosis not present

## 2021-09-07 DIAGNOSIS — E871 Hypo-osmolality and hyponatremia: Secondary | ICD-10-CM | POA: Diagnosis not present

## 2021-09-07 DIAGNOSIS — R739 Hyperglycemia, unspecified: Secondary | ICD-10-CM | POA: Diagnosis not present

## 2021-09-07 DIAGNOSIS — Z7982 Long term (current) use of aspirin: Secondary | ICD-10-CM | POA: Diagnosis not present

## 2021-09-07 DIAGNOSIS — E785 Hyperlipidemia, unspecified: Secondary | ICD-10-CM | POA: Diagnosis not present

## 2021-09-07 DIAGNOSIS — R110 Nausea: Secondary | ICD-10-CM | POA: Diagnosis not present

## 2021-09-07 DIAGNOSIS — E079 Disorder of thyroid, unspecified: Secondary | ICD-10-CM | POA: Diagnosis not present

## 2021-09-07 DIAGNOSIS — R531 Weakness: Secondary | ICD-10-CM | POA: Diagnosis not present

## 2021-09-07 DIAGNOSIS — Z79899 Other long term (current) drug therapy: Secondary | ICD-10-CM | POA: Diagnosis not present

## 2021-09-07 DIAGNOSIS — I10 Essential (primary) hypertension: Secondary | ICD-10-CM | POA: Diagnosis not present

## 2021-09-07 DIAGNOSIS — Z87891 Personal history of nicotine dependence: Secondary | ICD-10-CM | POA: Diagnosis not present

## 2021-09-07 DIAGNOSIS — R0902 Hypoxemia: Secondary | ICD-10-CM | POA: Diagnosis not present

## 2021-09-07 DIAGNOSIS — E1165 Type 2 diabetes mellitus with hyperglycemia: Secondary | ICD-10-CM | POA: Diagnosis not present

## 2021-09-07 DIAGNOSIS — Z7902 Long term (current) use of antithrombotics/antiplatelets: Secondary | ICD-10-CM | POA: Diagnosis not present

## 2021-09-07 DIAGNOSIS — Z888 Allergy status to other drugs, medicaments and biological substances status: Secondary | ICD-10-CM | POA: Diagnosis not present

## 2021-09-07 NOTE — Telephone Encounter (Signed)
Patient called stating her sugar level is a 453 and wanted advice on how much insulin she needs to injected. Please advise on what patient should do.

## 2021-09-07 NOTE — Telephone Encounter (Signed)
I called [name] and she states she doesn't have a ride to the ED. I called EMS for transport to the ED. They will call her for more information.

## 2021-09-07 NOTE — Telephone Encounter (Signed)
Agree with medical advice as below

## 2021-09-09 ENCOUNTER — Telehealth: Payer: Self-pay | Admitting: General Practice

## 2021-09-09 NOTE — Telephone Encounter (Signed)
Transition Care Management Follow-up Telephone Call Date of discharge and from where: 09/07/21 from [place] How have you been since you were released from the hospital? States CBG is 353. Overall feeling ok. Has started the medications that the were prescribed at the discharge from the hospital. She has an appointment on 09/13/21 to see Dr. Madilyn Fireman. Any questions or concerns? No  Items Reviewed: Did the pt receive and understand the discharge instructions provided? No  Medications obtained and verified? Yes  Other? No  Any new allergies since your discharge? No  Dietary orders reviewed? No Do you have support at home? Yes   Home Care and Equipment/Supplies: Were home health services ordered? no  Functional Questionnaire: (I = Independent and D = Dependent) ADLs: I  Bathing/Dressing- I  Meal Prep- I  Eating- I  Maintaining continence- I  Transferring/Ambulation- I  Managing Meds- I  Follow up appointments reviewed:  PCP Hospital f/u appt confirmed? Yes  Scheduled to see Dr. Madilyn Fireman  on 09/13/21. [facility] f/u appt confirmed? No   Are transportation arrangements needed? No  If their condition worsens, is the pt aware to call PCP or go to the Emergency Dept.? Yes Was the patient provided with contact information for the PCP's office or ED? Yes Was to pt encouraged to call back with questions or concerns? Yes

## 2021-09-12 ENCOUNTER — Other Ambulatory Visit: Payer: Self-pay | Admitting: Family Medicine

## 2021-09-12 ENCOUNTER — Telehealth: Payer: Self-pay

## 2021-09-12 DIAGNOSIS — I10 Essential (primary) hypertension: Secondary | ICD-10-CM | POA: Diagnosis not present

## 2021-09-12 DIAGNOSIS — G6181 Chronic inflammatory demyelinating polyneuritis: Secondary | ICD-10-CM | POA: Diagnosis not present

## 2021-09-12 DIAGNOSIS — E1165 Type 2 diabetes mellitus with hyperglycemia: Secondary | ICD-10-CM | POA: Diagnosis not present

## 2021-09-12 DIAGNOSIS — J449 Chronic obstructive pulmonary disease, unspecified: Secondary | ICD-10-CM | POA: Diagnosis not present

## 2021-09-12 DIAGNOSIS — K580 Irritable bowel syndrome with diarrhea: Secondary | ICD-10-CM | POA: Diagnosis not present

## 2021-09-12 DIAGNOSIS — J9611 Chronic respiratory failure with hypoxia: Secondary | ICD-10-CM | POA: Diagnosis not present

## 2021-09-12 NOTE — Telephone Encounter (Signed)
[place] PT assistant called to report patient's blood sugar readings today at 357. Patient's BP today was 86/60. [place] can be reached at [phone] if there are any further questions. Forward to Dr. Madilyn Fireman for review.

## 2021-09-12 NOTE — Telephone Encounter (Signed)
I believe she is taking her furosemide every other day.  Please have her skip it completely for several days.

## 2021-09-13 ENCOUNTER — Other Ambulatory Visit: Payer: Self-pay

## 2021-09-13 ENCOUNTER — Ambulatory Visit (INDEPENDENT_AMBULATORY_CARE_PROVIDER_SITE_OTHER): Payer: Medicare Other | Admitting: Family Medicine

## 2021-09-13 ENCOUNTER — Encounter: Payer: Self-pay | Admitting: Family Medicine

## 2021-09-13 VITALS — BP 87/53 | HR 67 | Resp 16 | Ht 67.0 in | Wt 160.0 lb

## 2021-09-13 DIAGNOSIS — I9589 Other hypotension: Secondary | ICD-10-CM | POA: Diagnosis not present

## 2021-09-13 DIAGNOSIS — E1140 Type 2 diabetes mellitus with diabetic neuropathy, unspecified: Secondary | ICD-10-CM | POA: Diagnosis not present

## 2021-09-13 DIAGNOSIS — E860 Dehydration: Secondary | ICD-10-CM | POA: Diagnosis not present

## 2021-09-13 DIAGNOSIS — B370 Candidal stomatitis: Secondary | ICD-10-CM

## 2021-09-13 DIAGNOSIS — F331 Major depressive disorder, recurrent, moderate: Secondary | ICD-10-CM | POA: Diagnosis not present

## 2021-09-13 DIAGNOSIS — J449 Chronic obstructive pulmonary disease, unspecified: Secondary | ICD-10-CM

## 2021-09-13 DIAGNOSIS — M21372 Foot drop, left foot: Secondary | ICD-10-CM | POA: Diagnosis not present

## 2021-09-13 DIAGNOSIS — M21371 Foot drop, right foot: Secondary | ICD-10-CM | POA: Diagnosis not present

## 2021-09-13 DIAGNOSIS — G600 Hereditary motor and sensory neuropathy: Secondary | ICD-10-CM | POA: Diagnosis not present

## 2021-09-13 DIAGNOSIS — E861 Hypovolemia: Secondary | ICD-10-CM | POA: Diagnosis not present

## 2021-09-13 LAB — POCT GLYCOSYLATED HEMOGLOBIN (HGB A1C): Hemoglobin A1C: 14 % — AB (ref 4.0–5.6)

## 2021-09-13 MED ORDER — LANTUS SOLOSTAR 100 UNIT/ML ~~LOC~~ SOPN: 10.0000 [IU] | PEN_INJECTOR | Freq: Every day | SUBCUTANEOUS | 0 refills | Status: DC

## 2021-09-13 MED ORDER — NYSTATIN 100000 UNIT/ML MT SUSP: 5.0000 mL | Freq: Four times a day (QID) | OROMUCOSAL | 0 refills | Status: DC

## 2021-09-13 MED ORDER — METFORMIN HCL ER 500 MG PO TB24: 500.0000 mg | ORAL_TABLET | Freq: Every day | ORAL | 1 refills | Status: DC

## 2021-09-13 NOTE — Progress Notes (Signed)
Established Patient Office Visit  Subjective:  Patient ID: [name], female    DOB: 08/15/1945  Age: 76 y.o. MRN: [id number]  CC:  Chief Complaint  Patient presents with   Hospital Follow up     Elevated Blood sugar readings.     HPI [name] presents for   BP has been running low for couple of days.  In fact we did receive a call yesterday from home health about her low blood pressures.  She still takes furosemide every other day and metoprolol 25 mg once a day.  He just feels extremely weak and fatigued.  Yesterday she actually felt a little lightheaded and dizzy but today that is better.  Her daughter who is here with her today said that they tapered off of the Prozac but never got the prescription for the Cymbalta.  We did call the pharmacy while she was here and verified that they did deliver it on January 13.  She also has been taking the potassium daily and wanted to know if I would be willing to take over that prescription she has to split it because it so large.  Hyperglycemia-back in the fall she was doing well enough that we discontinued her Lantus she was only on 10 units a day.  She continued to do well A1c was 5.9 and so we tapered off her metformin and she was only taking 500 mg once a day.  Suddenly her sugars shot up a couple of weeks ago and she ended up going to the hospital blood sugars were between 300-500 she denies any known of infections wounds etc.  No dysuria.  No recent change in cough.  He had been using a topical steroid paste in her mouth for some ulcers recently but she denies any other changes in her diet or activity level.  They restarted her insulin and told her to restart her metformin when she got home she is back on 500 mg would like to consider switching to extended release.  ED notes from February 1 reviewed.  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   Spinal stenosis, lumbar region, without neurogenic claudication    Thoracic spondylosis without myelopathy    Thyroid disease    hypo   Tobacco dependence    Unspecified hereditary and idiopathic peripheral neuropathy     Past Surgical History:  Procedure Laterality Date   ANKLE RECONSTRUCTION     left ankle, plate and pins    bilateral L3 medial branch block     CHOLECYSTECTOMY     fibroid tumor removal     fluoroscopic L5 dorsal medial branch bloc     RENAL ARTERY STENT  11/28/2010   left  REPLACEMENT TOTAL KNEE      Family History  Problem Relation Age of Onset   Stroke Mother    Heart disease Father 59       heart attack   Hypertension Father    Cancer Father        lung   Diabetes Paternal Aunt        insulin dependent    Social History   Socioeconomic History   Marital status: Widowed    Spouse name: Not on file   Number of children: 2   Years of education: 27   Highest education level: Some college, no degree  Occupational History   Not on file  Tobacco Use   Smoking status: Every Day    Packs/day: 0.50    Years: 49.00    Pack years: 24.50    Types: Cigarettes   Smokeless tobacco: Never  Vaping Use   Vaping Use: Never used  Substance and Sexual Activity   Alcohol use: No    Alcohol/week: 0.0 standard drinks   Drug use: No   Sexual activity: Not Currently  Other Topics Concern   Not  on file  Social History Narrative   Lives alone but her daughter lives close by and helps when she can. She has a cat. She is not able to read any more due to macular degeneration but she does listen to audio books.    Social Determinants of Health   Financial Resource Strain: Low Risk    Difficulty of Paying Living Expenses: Not hard at all  Food Insecurity: No Food Insecurity   Worried About Charity fundraiser in the Last Year: Never true   [place] in the Last Year: Never true  Transportation Needs: No Transportation Needs   Lack of Transportation (Medical): No   Lack of Transportation (Non-Medical): No  Physical Activity: Sufficiently Active   Days of Exercise per Week: 4 days   Minutes of Exercise per Session: 60 min  Stress: No Stress Concern Present   Feeling of Stress : Not at all  Social Connections: Socially Isolated   Frequency of Communication with Friends and Family: More than three times a week   Frequency of Social Gatherings with Friends and Family: Twice a week   Attends Religious Services: Never   Marine scientist or Organizations: Patient refused   Attends Archivist Meetings: Never   Marital Status: Widowed  Human resources officer Violence: Not At Risk   Fear of Current or Ex-Partner: No   Emotionally Abused: No   Physically Abused: No   Sexually Abused: No    Outpatient Medications Prior to Visit  Medication Sig Dispense Refill   albuterol (ACCUNEB) 1.25 MG/3ML nebulizer solution Take 3 mLs (1.25 mg total) by nebulization every 6 (six) hours as needed for wheezing or shortness of breath. 75 mL 12   alendronate (FOSAMAX) 70 MG tablet TAKE 1 TABLET BY MOUTH EVERY 7 DAYS. TAKE IN THE MORNING WITH A FULL GLASS OF WATER ON AN EMPTY STOMACH. DO NOT TAKE OTHER FOOD OR DRINK OR LIE DOWN 12 tablet 1   AMBULATORY NON FORMULARY MEDICATION Medication Name: Nebulizer machine with equipment.  Diagnosis COPD.  Patient is primarily  homebound.  Is faxed to [facility]. She is an established patient. 1 Units 0   AMBULATORY NON FORMULARY MEDICATION Medication Name: Tub Transfer Bench 1 each 0   AMBULATORY NON FORMULARY MEDICATION Medication Name: Bilateral knee braces for weakness,foot drop.Medication Name: Bilateral knee  braces for weakness,foot drop. Restore Phone: [phone] Fax : [phone] 2 each 0   aspirin EC 81 MG tablet Take 81 mg by mouth daily.     atorvastatin (LIPITOR) 80 MG tablet Take 1 tablet (80 mg total) by mouth at bedtime. 90 tablet 0   blood glucose meter kit and supplies KIT Dispense based on patient and insurance preference. Use up to four times daily as directed Dx E11.40 1 each 0   calcium carbonate (OS-CAL) 600 MG tablet Take by mouth.     clopidogrel (PLAVIX) 75 MG tablet TAKE ONE TABLET BY MOUTH EVERY DAY 90 tablet 11   dicyclomine (BENTYL) 20 MG tablet TAKE ONE TABLET BY MOUTH THREE TIMES DAILY AS NEEDED FOR SPASMS 90 tablet 2   diphenoxylate-atropine (LOMOTIL) 2.5-0.025 MG tablet TAKE ONE OR TWO TABLETS BY MOUTH TWICE DAILY AS NEEDED FOR DIARRHEA OR LOOSE STOOLS 60 tablet 3   furosemide (LASIX) 40 MG tablet Take 1 tablet (40 mg total) by mouth every other day. 45 tablet 1   gabapentin (NEURONTIN) 100 MG capsule Take 1 capsule (100 mg total) by mouth 3 (three) times daily AND 2 CAPSULES at bedtime. 150 capsule 2   GAMUNEX-C 20 GM/200ML SOLN      lansoprazole (PREVACID) 30 MG capsule Take 1 capsule (30 mg total) by mouth daily at 12 noon. 90 capsule 0   levalbuterol (XOPENEX) 0.63 MG/3ML nebulizer solution Take 3 mLs (0.63 mg total) by nebulization every 8 (eight) hours as needed for wheezing or shortness of breath. 60 mL 3   levothyroxine (SYNTHROID) 112 MCG tablet Take 1 tablet (112 mcg total) by mouth daily. 30 tablet 1   lidocaine (XYLOCAINE) 5 % ointment Apply 1 application topically daily. 2 hours before skin procedure. 35.44 g 11   meclizine (ANTIVERT) 25 MG tablet Take 1  tablet (25 mg total) by mouth 3 (three) times daily as needed for dizziness. 90 tablet 2   metoprolol tartrate (LOPRESSOR) 25 MG tablet Take 1 tablet (25 mg total) by mouth 2 (two) times daily. 60 tablet 2   Multiple Vitamins-Minerals (PRESERVISION AREDS 2) CAPS      mycophenolate (CELLCEPT) 500 MG tablet Take by mouth.  5   nitroGLYCERIN (NITROSTAT) 0.4 MG SL tablet Place 1 tablet (0.4 mg total) under the tongue every 5 (five) minutes as needed. 10 tablet 2   potassium chloride SA (KLOR-CON M) 20 MEQ tablet Take 1 tablet (20 mEq total) by mouth daily. 30 tablet 3   TRELEGY ELLIPTA 100-62.5-25 MCG/INH AEPB Inhale 1 puff into the lungs every morning. 60 each 5   zolpidem (AMBIEN) 5 MG tablet Take 1/2-1 tablet (2.5-5 mg total) by mouth at bedtime. 45 tablet 1   DULoxetine (CYMBALTA) 30 MG capsule Take 1 capsule (30 mg total) by mouth daily. 90 capsule 0   No facility-administered medications prior to visit.    Allergies  Allergen Reactions   Varenicline Nausea And Vomiting    ROS Review of Systems    Objective:    Physical Exam Constitutional:      Appearance: Normal appearance. She is well-developed.  HENT:     Head: Normocephalic and atraumatic.     Mouth/Throat:     Mouth: Mucous membranes are dry.     Comments: That is very dry tongue is erythematous.  The skin MS has a dull appearance to it. Cardiovascular:     Rate and Rhythm: Normal rate and regular rhythm.     Heart sounds: Normal heart sounds.  Pulmonary:     Effort: Pulmonary effort is normal.     Breath sounds: Normal breath sounds.  Skin:    General: Skin is warm and dry.  Neurological:     Mental Status: She is alert and oriented to person, place, and time.  Psychiatric:        Behavior: Behavior normal.    BP (!) 87/53    Pulse 67    Resp 16    Ht 5\' 7"  (1.702 m)    Wt 160 lb (72.6 kg)    SpO2 97%    BMI 25.06 kg/m  Wt Readings from Last 3 Encounters:  09/13/21 160 lb (72.6 kg)  06/13/21 166 lb  (75.3 kg)  10/22/20 184 lb (83.5 kg)     Health Maintenance Due  Topic Date Due   Zoster Vaccines- Shingrix (1 of 2) Never done   URINE MICROALBUMIN  12/05/2017   COVID-19 Vaccine (2 - Janssen risk series) 11/28/2019   OPHTHALMOLOGY EXAM  03/10/2021    There are no preventive care reminders to display for this patient.  Lab Results  Component Value Date   TSH 0.62 06/13/2021   Lab Results  Component Value Date   WBC 7.9 11/29/2020   HGB 10.7 (L) 11/29/2020   HCT 33.2 (L) 11/29/2020   MCV 88.8 11/29/2020   PLT 315 11/29/2020   Lab Results  Component Value Date   NA 133 (L) 06/13/2021   K 4.4 06/13/2021   CO2 25 06/13/2021   GLUCOSE 173 (H) 06/13/2021   BUN 16 06/13/2021   CREATININE 1.00 06/13/2021   BILITOT 0.7 11/29/2020   ALKPHOS 51 09/01/2015   AST 10 11/29/2020   ALT 3 (L) 11/29/2020   PROT 7.7 11/29/2020   ALBUMIN 3.4 (L) 09/01/2015   CALCIUM 9.5 06/13/2021   EGFR 58 (L) 06/13/2021   Lab Results  Component Value Date   CHOL 98 06/13/2021   Lab Results  Component Value Date   HDL 38 (L) 06/13/2021   Lab Results  Component Value Date   LDLCALC 37 06/13/2021   Lab Results  Component Value Date   TRIG 150 (H) 06/13/2021   Lab Results  Component Value Date   CHOLHDL 2.6 06/13/2021   Lab Results  Component Value Date   HGBA1C 14.0 (A) 09/13/2021      Assessment & Plan:   Problem List Items Addressed This Visit       Respiratory   COPD, group D, by GOLD 2017 classification (HCC)    Lungs are lungs are clear on exam today.        Endocrine   Diabetes mellitus with neuropathy [facility]) - Primary    Really not clear what happened between November when her A1c's were so well controlled off of medication and to suddenly out-of-control in the last couple of weeks.  She was using a topical steroid paste for ulcers that certainly a possibility but typically the glucose does not go that high so not sure what is going on.  No other signs of  infection going on that could be causing some abnormalities.  We will go ahead and switch her metformin to the extended release version since she is prone to diarrhea on the medication and restart her insulin short-term.      Relevant Medications   metFORMIN (GLUCOPHAGE-XR) 500 MG 24 hr tablet   insulin glargine (LANTUS SOLOSTAR) 100 UNIT/ML Solostar Pen   Other Relevant Orders   POCT HgB A1C (Completed)  DME Wheelchair manual     Nervous and Auditory   CHARCOT-MARIE-TOOTH DISEASE    Would like a prescription for a wheelchair.  Order placed today.      Relevant Orders   DME Wheelchair manual     Other   Major depressive disorder, recurrent episode ([place])    We did call the pharmacy to confirm that they did deliver the Cymbalta.  Just encouraged him to check again at home and gave them to the generic names since the generic was filled at the pharmacy.  It was a 90-day so unfortunately I do not know that we will be able to call this in sooner but we can see what we can do if she cannot find it.      Dehydration    Blood pressure is low today I do think she is likely over diuresed.  We will have her stop the furosemide for now for several days as well as cutting her metoprolol in half and let her blood pressure just gently come back up.  I do encourage her to hydrate.      Bilateral foot-drop   Other Visit Diagnoses     Thrush       Relevant Medications   nystatin (MYCOSTATIN) 100000 UNIT/ML suspension   Hypotension due to hypovolemia           Thrush-we will treat with nystatin swish and swallow.  Sores that she complained about previously during telephone visit seem to have resolved.  Hypotension-I think it secondary to volume depletion.  Likely culprit for her dizziness yesterday though she does feel little bit better today.  See note above.  Meds ordered this encounter  Medications   nystatin (MYCOSTATIN) 100000 UNIT/ML suspension    Sig: Take 5 mLs (500,000 Units  total) by mouth 4 (four) times daily. X one week.    Dispense:  140 mL    Refill:  0   metFORMIN (GLUCOPHAGE-XR) 500 MG 24 hr tablet    Sig: Take 1 tablet (500 mg total) by mouth daily with breakfast.    Dispense:  90 tablet    Refill:  1   insulin glargine (LANTUS SOLOSTAR) 100 UNIT/ML Solostar Pen    Sig: Inject 10-15 Units into the skin at bedtime.    Dispense:  15 mL    Refill:  0    Follow-up: Return in about 3 weeks (around 10/04/2021) for telephone visit for BP and glucose.    [name], MD

## 2021-09-13 NOTE — Patient Instructions (Signed)
Please hold your furosemide for several days.  Just take it if you feel like you need it for swelling. Please split your metoprolol in half for the next 2 days so we can allow your blood pressure to come up a little bit. Sent over prescription for nystatin swish and swallow to take for your mouth. I will send over the extended release metformin.  And will do the long-acting insulin hopefully just short-term.

## 2021-09-13 NOTE — Assessment & Plan Note (Signed)
Lungs are lungs are clear on exam today.

## 2021-09-13 NOTE — Assessment & Plan Note (Signed)
Really not clear what happened between November when her A1c's were so well controlled off of medication and to suddenly out-of-control in the last couple of weeks.  She was using a topical steroid paste for ulcers that certainly a possibility but typically the glucose does not go that high so not sure what is going on.  No other signs of infection going on that could be causing some abnormalities.  We will go ahead and switch her metformin to the extended release version since she is prone to diarrhea on the medication and restart her insulin short-term.

## 2021-09-13 NOTE — Assessment & Plan Note (Signed)
Would like a prescription for a wheelchair.  Order placed today.

## 2021-09-13 NOTE — Assessment & Plan Note (Signed)
We did call the pharmacy to confirm that they did deliver the Cymbalta.  Just encouraged him to check again at home and gave them to the generic names since the generic was filled at the pharmacy.  It was a 90-day so unfortunately I do not know that we will be able to call this in sooner but we can see what we can do if she cannot find it.

## 2021-09-13 NOTE — Telephone Encounter (Signed)
Called [place] to make her aware of the furosemide hold. She gave verbal understanding.

## 2021-09-13 NOTE — Assessment & Plan Note (Signed)
Blood pressure is low today I do think she is likely over diuresed.  We will have her stop the furosemide for now for several days as well as cutting her metoprolol in half and let her blood pressure just gently come back up.  I do encourage her to hydrate.

## 2021-09-14 ENCOUNTER — Other Ambulatory Visit: Payer: Self-pay | Admitting: Family Medicine

## 2021-09-15 DIAGNOSIS — G6181 Chronic inflammatory demyelinating polyneuritis: Secondary | ICD-10-CM

## 2021-09-15 DIAGNOSIS — E1165 Type 2 diabetes mellitus with hyperglycemia: Secondary | ICD-10-CM

## 2021-09-20 DIAGNOSIS — I10 Essential (primary) hypertension: Secondary | ICD-10-CM | POA: Diagnosis not present

## 2021-09-20 DIAGNOSIS — G6181 Chronic inflammatory demyelinating polyneuritis: Secondary | ICD-10-CM | POA: Diagnosis not present

## 2021-09-20 DIAGNOSIS — J449 Chronic obstructive pulmonary disease, unspecified: Secondary | ICD-10-CM | POA: Diagnosis not present

## 2021-09-20 DIAGNOSIS — K580 Irritable bowel syndrome with diarrhea: Secondary | ICD-10-CM | POA: Diagnosis not present

## 2021-09-20 DIAGNOSIS — J9611 Chronic respiratory failure with hypoxia: Secondary | ICD-10-CM | POA: Diagnosis not present

## 2021-09-20 DIAGNOSIS — E1165 Type 2 diabetes mellitus with hyperglycemia: Secondary | ICD-10-CM | POA: Diagnosis not present

## 2021-09-27 DIAGNOSIS — I10 Essential (primary) hypertension: Secondary | ICD-10-CM | POA: Diagnosis not present

## 2021-09-27 DIAGNOSIS — J449 Chronic obstructive pulmonary disease, unspecified: Secondary | ICD-10-CM | POA: Diagnosis not present

## 2021-09-27 DIAGNOSIS — K580 Irritable bowel syndrome with diarrhea: Secondary | ICD-10-CM | POA: Diagnosis not present

## 2021-09-27 DIAGNOSIS — E1165 Type 2 diabetes mellitus with hyperglycemia: Secondary | ICD-10-CM | POA: Diagnosis not present

## 2021-09-27 DIAGNOSIS — J9611 Chronic respiratory failure with hypoxia: Secondary | ICD-10-CM | POA: Diagnosis not present

## 2021-09-27 DIAGNOSIS — G6181 Chronic inflammatory demyelinating polyneuritis: Secondary | ICD-10-CM | POA: Diagnosis not present

## 2021-10-02 DIAGNOSIS — K580 Irritable bowel syndrome with diarrhea: Secondary | ICD-10-CM | POA: Diagnosis not present

## 2021-10-02 DIAGNOSIS — E7800 Pure hypercholesterolemia, unspecified: Secondary | ICD-10-CM | POA: Diagnosis not present

## 2021-10-02 DIAGNOSIS — Z7951 Long term (current) use of inhaled steroids: Secondary | ICD-10-CM | POA: Diagnosis not present

## 2021-10-02 DIAGNOSIS — H3530 Unspecified macular degeneration: Secondary | ICD-10-CM | POA: Diagnosis not present

## 2021-10-02 DIAGNOSIS — Z9181 History of falling: Secondary | ICD-10-CM | POA: Diagnosis not present

## 2021-10-02 DIAGNOSIS — Z7902 Long term (current) use of antithrombotics/antiplatelets: Secondary | ICD-10-CM | POA: Diagnosis not present

## 2021-10-02 DIAGNOSIS — I10 Essential (primary) hypertension: Secondary | ICD-10-CM | POA: Diagnosis not present

## 2021-10-02 DIAGNOSIS — E039 Hypothyroidism, unspecified: Secondary | ICD-10-CM | POA: Diagnosis not present

## 2021-10-02 DIAGNOSIS — Z9981 Dependence on supplemental oxygen: Secondary | ICD-10-CM | POA: Diagnosis not present

## 2021-10-02 DIAGNOSIS — G6181 Chronic inflammatory demyelinating polyneuritis: Secondary | ICD-10-CM | POA: Diagnosis not present

## 2021-10-02 DIAGNOSIS — M1990 Unspecified osteoarthritis, unspecified site: Secondary | ICD-10-CM | POA: Diagnosis not present

## 2021-10-02 DIAGNOSIS — E538 Deficiency of other specified B group vitamins: Secondary | ICD-10-CM | POA: Diagnosis not present

## 2021-10-02 DIAGNOSIS — F32A Depression, unspecified: Secondary | ICD-10-CM | POA: Diagnosis not present

## 2021-10-02 DIAGNOSIS — E1136 Type 2 diabetes mellitus with diabetic cataract: Secondary | ICD-10-CM | POA: Diagnosis not present

## 2021-10-02 DIAGNOSIS — J449 Chronic obstructive pulmonary disease, unspecified: Secondary | ICD-10-CM | POA: Diagnosis not present

## 2021-10-02 DIAGNOSIS — E1165 Type 2 diabetes mellitus with hyperglycemia: Secondary | ICD-10-CM | POA: Diagnosis not present

## 2021-10-02 DIAGNOSIS — K219 Gastro-esophageal reflux disease without esophagitis: Secondary | ICD-10-CM | POA: Diagnosis not present

## 2021-10-02 DIAGNOSIS — D638 Anemia in other chronic diseases classified elsewhere: Secondary | ICD-10-CM | POA: Diagnosis not present

## 2021-10-02 DIAGNOSIS — R339 Retention of urine, unspecified: Secondary | ICD-10-CM | POA: Diagnosis not present

## 2021-10-02 DIAGNOSIS — G4733 Obstructive sleep apnea (adult) (pediatric): Secondary | ICD-10-CM | POA: Diagnosis not present

## 2021-10-02 DIAGNOSIS — J9611 Chronic respiratory failure with hypoxia: Secondary | ICD-10-CM | POA: Diagnosis not present

## 2021-10-02 DIAGNOSIS — G600 Hereditary motor and sensory neuropathy: Secondary | ICD-10-CM | POA: Diagnosis not present

## 2021-10-04 ENCOUNTER — Telehealth (INDEPENDENT_AMBULATORY_CARE_PROVIDER_SITE_OTHER): Payer: Medicare Other | Admitting: Family Medicine

## 2021-10-04 ENCOUNTER — Encounter: Payer: Self-pay | Admitting: Family Medicine

## 2021-10-04 DIAGNOSIS — R1900 Intra-abdominal and pelvic swelling, mass and lump, unspecified site: Secondary | ICD-10-CM | POA: Diagnosis not present

## 2021-10-04 DIAGNOSIS — R110 Nausea: Secondary | ICD-10-CM | POA: Diagnosis not present

## 2021-10-04 DIAGNOSIS — F5101 Primary insomnia: Secondary | ICD-10-CM | POA: Diagnosis not present

## 2021-10-04 DIAGNOSIS — K219 Gastro-esophageal reflux disease without esophagitis: Secondary | ICD-10-CM

## 2021-10-04 DIAGNOSIS — E1140 Type 2 diabetes mellitus with diabetic neuropathy, unspecified: Secondary | ICD-10-CM | POA: Diagnosis not present

## 2021-10-04 DIAGNOSIS — J449 Chronic obstructive pulmonary disease, unspecified: Secondary | ICD-10-CM | POA: Diagnosis not present

## 2021-10-04 DIAGNOSIS — R197 Diarrhea, unspecified: Secondary | ICD-10-CM

## 2021-10-04 MED ORDER — LANTUS SOLOSTAR 100 UNIT/ML ~~LOC~~ SOPN: 25.0000 [IU] | PEN_INJECTOR | Freq: Every day | SUBCUTANEOUS | 0 refills | Status: DC

## 2021-10-04 MED ORDER — LANSOPRAZOLE 30 MG PO CPDR: 30.0000 mg | DELAYED_RELEASE_CAPSULE | Freq: Every day | ORAL | 0 refills | Status: DC | PRN

## 2021-10-04 MED ORDER — TRELEGY ELLIPTA 100-62.5-25 MCG/ACT IN AEPB: 1.0000 | INHALATION_SPRAY | Freq: Every day | RESPIRATORY_TRACT | 3 refills | Status: DC

## 2021-10-04 NOTE — Assessment & Plan Note (Signed)
She still really struggling with her insomnia.  Continue to take the Ambien 5 mg daily we did not make any changes today.

## 2021-10-04 NOTE — Assessment & Plan Note (Signed)
Uncontrolled.  Encouraged her to increase her Lantus to 20 units x 2 days and then continue to increase 1 unit daily until fasting blood sugars are under 130.

## 2021-10-04 NOTE — Assessment & Plan Note (Signed)
He has had an increase in cough recently but feels like it is more in her upper airway in her neck.  And she has been smoking a little bit more than usual as well.  She has not been sleeping well so when she gets up she tends to smoke more.  Did refill her Trelegy for 1 year.

## 2021-10-04 NOTE — Progress Notes (Addendum)
Virtual Visit via Telephone Note  I connected with [name] on 10/04/21 at  1:00 PM EST by telephone and verified that I am speaking with the correct person using two identifiers.   I discussed the limitations, risks, security and privacy concerns of performing an evaluation and management service by telephone and the availability of in person appointments. I also discussed with the patient that there may be a patient responsible charge related to this service. The patient expressed understanding and agreed to proceed.  Patient location: home Provider loccation: In office   Subjective:    CC:   Chief Complaint  Patient presents with   Hypertension    Follow up. Nurse checking BP once a week. Patient's BP has been running around118/60   Glucose Follow up    BS running around 200-300    Foot Swelling    Left leg swelling no improvement. Currently on lasix twice a week.     HPI: HTN - BPs looking much better.  They are not nearly as low as they were.  We backed off on her diuretic so she has been taking it 2 times per week and that is been working out well.  DM  -sugars are not well controlled right now.  She has been on 16 units for couple of weeks and sugars still in the 200-300s  Cough is worse but she is smoking more.  Her voice comes and goes in th afternoon. Has tried multiple OTC cough medications. Cough si more in throat.  No fever or other upper respiratory symptoms.  Past medical history, Surgical history, Family history not pertinant except as noted below, Social history, Allergies, and medications have been entered into the medical record, reviewed, and corrections made.   Review of Systems: No fevers, chills, night sweats, weight loss, chest pain, or shortness of breath.   Objective:    General: Speaking clearly in complete sentences without any shortness of breath.  Alert and oriented x3.  Normal judgment. No apparent acute distress.    Impression and  Recommendations:    Problem List Items Addressed This Visit       Respiratory   COPD, group D, by GOLD 2017 classification ([place]) - Primary    He has had an increase in cough recently but feels like it is more in her upper airway in her neck.  And she has been smoking a little bit more than usual as well.  She has not been sleeping well so when she gets up she tends to smoke more.  Did refill her Trelegy for 1 year.      Relevant Medications   Fluticasone-Umeclidin-Vilant (TRELEGY ELLIPTA) 100-62.5-25 MCG/ACT AEPB     Endocrine   Diabetes mellitus with neuropathy ([place])    Uncontrolled.  Encouraged her to increase her Lantus to 20 units x 2 days and then continue to increase 1 unit daily until fasting blood sugars are under 130.      Relevant Medications   insulin glargine (LANTUS SOLOSTAR) 100 UNIT/ML Solostar Pen     Other   INSOMNIA    She still really struggling with her insomnia.  Continue to take the Ambien 5 mg daily we did not make any changes today.      Other Visit Diagnoses     Nausea       Diarrhea, unspecified type       Left adnexal mass       Gastroesophageal reflux disease, unspecified whether esophagitis present  Relevant Medications   lansoprazole (PREVACID) 30 MG capsule       Meds ordered this encounter  Medications   insulin glargine (LANTUS SOLOSTAR) 100 UNIT/ML Solostar Pen    Sig: Inject 25-30 Units into the skin at bedtime.    Dispense:  1 mL    Refill:  0   lansoprazole (PREVACID) 30 MG capsule    Sig: Take 1 capsule (30 mg total) by mouth daily as needed.    Dispense:  90 capsule    Refill:  0   Fluticasone-Umeclidin-Vilant (TRELEGY ELLIPTA) 100-62.5-25 MCG/ACT AEPB    Sig: Inhale 1 puff into the lungs daily.    Dispense:  3 each    Refill:  3    Chronic upper airway cough-we discussed may be taking her PPI daily for couple of weeks to see if she notices improvement.  There are multiple causes for this.  She has upped her smoking as  well so that certainly could be contributing as well could also be irritation from her inhalers.  Could always refer to pulmonary for further evaluation if needed.  Meds ordered this encounter  Medications   insulin glargine (LANTUS SOLOSTAR) 100 UNIT/ML Solostar Pen    Sig: Inject 25-30 Units into the skin at bedtime.    Dispense:  1 mL    Refill:  0   lansoprazole (PREVACID) 30 MG capsule    Sig: Take 1 capsule (30 mg total) by mouth daily as needed.    Dispense:  90 capsule    Refill:  0   Fluticasone-Umeclidin-Vilant (TRELEGY ELLIPTA) 100-62.5-25 MCG/ACT AEPB    Sig: Inhale 1 puff into the lungs daily.    Dispense:  3 each    Refill:  3     I discussed the assessment and treatment plan with the patient. The patient was provided an opportunity to ask questions and all were answered. The patient agreed with the plan and demonstrated an understanding of the instructions.   The patient was advised to call back or seek an in-person evaluation if the symptoms worsen or if the condition fails to improve as anticipated.  I provided 25 minutes of non-face-to-face time during this encounter.   [name], MD

## 2021-10-05 DIAGNOSIS — G6181 Chronic inflammatory demyelinating polyneuritis: Secondary | ICD-10-CM | POA: Diagnosis not present

## 2021-10-05 DIAGNOSIS — I10 Essential (primary) hypertension: Secondary | ICD-10-CM | POA: Diagnosis not present

## 2021-10-05 DIAGNOSIS — J9611 Chronic respiratory failure with hypoxia: Secondary | ICD-10-CM | POA: Diagnosis not present

## 2021-10-05 DIAGNOSIS — J449 Chronic obstructive pulmonary disease, unspecified: Secondary | ICD-10-CM | POA: Diagnosis not present

## 2021-10-05 DIAGNOSIS — E1165 Type 2 diabetes mellitus with hyperglycemia: Secondary | ICD-10-CM | POA: Diagnosis not present

## 2021-10-05 DIAGNOSIS — K580 Irritable bowel syndrome with diarrhea: Secondary | ICD-10-CM | POA: Diagnosis not present

## 2021-10-07 ENCOUNTER — Other Ambulatory Visit: Payer: Self-pay | Admitting: Family Medicine

## 2021-10-08 ENCOUNTER — Other Ambulatory Visit: Payer: Self-pay | Admitting: Family Medicine

## 2021-10-11 ENCOUNTER — Other Ambulatory Visit: Payer: Self-pay | Admitting: Family Medicine

## 2021-10-11 DIAGNOSIS — I10 Essential (primary) hypertension: Secondary | ICD-10-CM | POA: Diagnosis not present

## 2021-10-11 DIAGNOSIS — K580 Irritable bowel syndrome with diarrhea: Secondary | ICD-10-CM | POA: Diagnosis not present

## 2021-10-11 DIAGNOSIS — E1165 Type 2 diabetes mellitus with hyperglycemia: Secondary | ICD-10-CM | POA: Diagnosis not present

## 2021-10-11 DIAGNOSIS — J449 Chronic obstructive pulmonary disease, unspecified: Secondary | ICD-10-CM | POA: Diagnosis not present

## 2021-10-11 DIAGNOSIS — G6181 Chronic inflammatory demyelinating polyneuritis: Secondary | ICD-10-CM | POA: Diagnosis not present

## 2021-10-11 DIAGNOSIS — J9611 Chronic respiratory failure with hypoxia: Secondary | ICD-10-CM | POA: Diagnosis not present

## 2021-10-12 DIAGNOSIS — R200 Anesthesia of skin: Secondary | ICD-10-CM | POA: Diagnosis not present

## 2021-10-12 DIAGNOSIS — R519 Headache, unspecified: Secondary | ICD-10-CM | POA: Diagnosis not present

## 2021-10-12 DIAGNOSIS — G6181 Chronic inflammatory demyelinating polyneuritis: Secondary | ICD-10-CM | POA: Diagnosis not present

## 2021-10-18 DIAGNOSIS — J449 Chronic obstructive pulmonary disease, unspecified: Secondary | ICD-10-CM | POA: Diagnosis not present

## 2021-10-18 DIAGNOSIS — K580 Irritable bowel syndrome with diarrhea: Secondary | ICD-10-CM | POA: Diagnosis not present

## 2021-10-18 DIAGNOSIS — G6181 Chronic inflammatory demyelinating polyneuritis: Secondary | ICD-10-CM | POA: Diagnosis not present

## 2021-10-18 DIAGNOSIS — J9611 Chronic respiratory failure with hypoxia: Secondary | ICD-10-CM | POA: Diagnosis not present

## 2021-10-18 DIAGNOSIS — I10 Essential (primary) hypertension: Secondary | ICD-10-CM | POA: Diagnosis not present

## 2021-10-18 DIAGNOSIS — E1165 Type 2 diabetes mellitus with hyperglycemia: Secondary | ICD-10-CM | POA: Diagnosis not present

## 2021-10-24 ENCOUNTER — Other Ambulatory Visit: Payer: Self-pay | Admitting: Family Medicine

## 2021-10-24 ENCOUNTER — Ambulatory Visit (INDEPENDENT_AMBULATORY_CARE_PROVIDER_SITE_OTHER): Payer: Medicare Other | Admitting: Family Medicine

## 2021-10-24 DIAGNOSIS — Z780 Asymptomatic menopausal state: Secondary | ICD-10-CM | POA: Diagnosis not present

## 2021-10-24 DIAGNOSIS — Z0000 Encounter for general adult medical examination without abnormal findings: Secondary | ICD-10-CM

## 2021-10-24 NOTE — Progress Notes (Signed)
? ? ?MEDICARE ANNUAL WELLNESS VISIT ? ?10/24/2021 ? ?Telephone Visit Disclaimer ?This Medicare AWV was conducted by telephone due to national recommendations for restrictions regarding the COVID-19 Pandemic (e.g. social distancing).  I verified, using two identifiers, that I am speaking with [name] or their authorized healthcare agent. I discussed the limitations, risks, security, and privacy concerns of performing an evaluation and management service by telephone and the potential availability of an in-person appointment in the future. The patient expressed understanding and agreed to proceed.  ?Location of Patient: Home ?Location of Provider (nurse):  In the office. ? ?Subjective:  ? ? ?Shavonne Ambroise is a 76 y.o. female patient of Metheney, Rene Kocher, MD who had a Medicare Annual Wellness Visit today via telephone. Shoni is Retired and lives alone. she has 2 children. she reports that she is socially active and does interact with friends/family regularly. she is minimally physically active and enjoys listening to her audio books as she is not able to read anymore due to macular degeneration. ? ?Patient Care Team: ?Hali Marry, MD as PCP - General (Family Medicine) ?Christin Fudge., MD as Referring Physician (Psychiatry) ?Kriste Basque, MD as Referring Physician (Pain Medicine) ? ?Advanced Directives 10/24/2021 10/22/2020 09/17/2015 09/11/2014 01/14/2014  ?Does Patient Have a Medical Advance Directive? Yes Yes Yes Yes Patient has advance directive, copy not in chart  ?Type of Advance Directive Living will;Healthcare Power of [place];Living will [place];Living will [place];Living will [place];Living will  ?Does patient want to make changes to medical advance directive? No - Patient declined No - Patient declined No - Patient declined - -  ?Copy of [place] in Chart? No - copy requested  No - copy requested No - copy requested No - copy requested -  ?Pre-existing out of facility DNR order (yellow form or pink MOST form) - - - - Yellow form placed in chart (order not valid for inpatient use)  ? ? ?Hospital Utilization Over the Past 12 Months: ?# of hospitalizations or ER visits: 4 ?# of surgeries: 0 ? ?Review of Systems    ?Patient reports that her overall health is worse compared to last year. ? ?History obtained from chart review and the patient ? ?Patient Reported Readings (BP, Pulse, CBG, Weight, etc) ?none ? ?Pain Assessment ?Pain : 0-10 ?Pain Score: 2  ?Pain Type: Chronic pain (Arthritis) ?Pain Location: Generalized ?Pain Orientation:  (all of her joints) ?Pain Descriptors / Indicators: Constant ?Pain Onset: More than a month ago ?Pain Frequency: Constant ?Pain Relieving Factors: none at this time. ? ?Pain Relieving Factors: none at this time. ? ?Current Medications & Allergies (verified) ?Allergies as of 10/24/2021   ? ?   Reactions  ? Varenicline Nausea And Vomiting  ? ?  ? ?  ?Medication List  ?  ? ?  ? Accurate as of October 24, 2021  1:22 PM. If you have any questions, ask your nurse or doctor.  ?  ?  ? ?  ? ?albuterol 1.25 MG/3ML nebulizer solution ?Commonly known as: ACCUNEB ?Take 3 mLs (1.25 mg total) by nebulization every 6 (six) hours as needed for wheezing or shortness of breath. ?  ?alendronate 70 MG tablet ?Commonly known as: FOSAMAX ?TAKE 1 TABLET BY MOUTH EVERY 7 DAYS. TAKE IN THE MORNING WITH A FULL GLASS OF WATER ON AN EMPTY STOMACH. DO NOT TAKE OTHER FOOD OR DRINK OR LIE DOWN ?  ?AMBULATORY  NON FORMULARY MEDICATION ?Medication Name: Nebulizer machine with equipment.  Diagnosis COPD.  Patient is primarily homebound.  Is faxed to [facility]. She is an established patient. ?  ?AMBULATORY NON FORMULARY MEDICATION ?Medication Name: Tub Transfer Bench ?  ?AMBULATORY NON FORMULARY MEDICATION ?Medication Name: Bilateral knee braces for weakness,foot drop.Medication Name: Bilateral knee  braces for weakness,foot drop. Restore Phone: [phone] ?Fax : [phone] ?  ?aspirin EC 81 MG tablet ?Take 81 mg by mouth daily. ?  ?atorvastatin 80 MG tablet ?Commonly known as: LIPITOR ?Take 1 tablet (80 mg total) by mouth at bedtime. ?  ?blood glucose meter kit and supplies Kit ?Dispense based on patient and insurance preference. Use up to four times daily as directed ?Dx E11.40 ?  ?calcium carbonate 600 MG tablet ?Commonly known as: OS-CAL ?Take by mouth. ?  ?clopidogrel 75 MG tablet ?Commonly known as: PLAVIX ?TAKE ONE TABLET BY MOUTH EVERY DAY ?  ?dicyclomine 20 MG tablet ?Commonly known as: BENTYL ?TAKE ONE TABLET BY MOUTH THREE TIMES DAILY AS NEEDED FOR SPASMS ?  ?diphenoxylate-atropine 2.5-0.025 MG tablet ?Commonly known as: LOMOTIL ?TAKE ONE OR TWO TABLETS BY MOUTH TWICE DAILY AS NEEDED FOR DIARRHEA OR LOOSE STOOLS ?  ?DULoxetine 30 MG capsule ?Commonly known as: Cymbalta ?Take 1 capsule (30 mg total) by mouth daily. ?  ?furosemide 40 MG tablet ?Commonly known as: LASIX ?Take 1 tablet (40 mg total) by mouth every other day. ?  ?gabapentin 100 MG capsule ?Commonly known as: NEURONTIN ?Take 1 capsule (100 mg total) by mouth 3 (three) times daily AND 2 CAPSULES at bedtime. ?  ?Gamunex-C 20 GM/200ML Soln ?Generic drug: Immune Globulin (Human) ?  ?lansoprazole 30 MG capsule ?Commonly known as: Prevacid ?Take 1 capsule (30 mg total) by mouth daily as needed. ?  ?Lantus SoloStar 100 UNIT/ML Solostar Pen ?Generic drug: insulin glargine ?Inject 25-30 Units into the skin at bedtime. ?  ?levalbuterol 0.63 MG/3ML nebulizer solution ?Commonly known as: XOPENEX ?Take 3 mLs (0.63 mg total) by nebulization every 8 (eight) hours as needed for wheezing or shortness of breath. ?  ?levothyroxine 112 MCG tablet ?Commonly known as: SYNTHROID ?Take 1 tablet (112 mcg total) by mouth daily. ?  ?lidocaine 5 % ointment ?Commonly known as: XYLOCAINE ?Apply 1 application topically daily. 2 hours before skin procedure. ?   ?meclizine 25 MG tablet ?Commonly known as: ANTIVERT ?Take 1 tablet (25 mg total) by mouth 3 (three) times daily as needed for dizziness. ?  ?metFORMIN 500 MG 24 hr tablet ?Commonly known as: GLUCOPHAGE-XR ?Take 1 tablet (500 mg total) by mouth daily with breakfast. ?  ?metoprolol tartrate 25 MG tablet ?Commonly known as: LOPRESSOR ?Take 1 tablet (25 mg total) by mouth 2 (two) times daily. ?  ?mycophenolate 500 MG tablet ?Commonly known as: CELLCEPT ?Take by mouth. ?  ?nitroGLYCERIN 0.4 MG SL tablet ?Commonly known as: NITROSTAT ?Place 1 tablet (0.4 mg total) under the tongue every 5 (five) minutes as needed. ?  ?nystatin 100000 UNIT/ML suspension ?Commonly known as: MYCOSTATIN ?Take 5 mLs (500,000 Units total) by mouth 4 (four) times daily. X one week. ?  ?potassium chloride SA 20 MEQ tablet ?Commonly known as: KLOR-CON M ?Take 1 tablet (20 mEq total) by mouth daily. ?  ?PreserVision AREDS 2 Caps ?  ?Trelegy Ellipta 100-62.5-25 MCG/ACT Aepb ?Generic drug: Fluticasone-Umeclidin-Vilant ?Inhale 1 puff into the lungs daily. ?  ?zolpidem 5 MG tablet ?Commonly known as: AMBIEN ?Take 1/2-1 tablet (2.5-5 mg total) by mouth at bedtime. ?  ? ?  ? ? ?  History (reviewed): ?Past Medical History:  ?Diagnosis Date  ? Arthritis   ? Charcot-Marie-Tooth disease   ? COPD (chronic obstructive pulmonary disease) ([place])   ? DDD (degenerative disc disease)   ? Lumbar and lumbosacral  ? Depression   ? Diabetes mellitus   ? type 2  ? Diabetic peripheral neuropathy ([place])   ? Facet syndrome, lumbar   ? Gastric ulcer   ? w/o hemorrhage  ? Hyperlipidemia   ? Hypertension   ? Insomnia   ? Lumbosacral root lesions, not elsewhere classified   ? Neurogenic bladder   ? Obesity   ? Osteopenia   ? Other symptoms referable to back   ? Post-menopausal   ? PUD (peptic ulcer disease)   ? 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 Spinal stenosis, lumbar region, without neurogenic claudication   ? Thoracic spondylosis without myelopathy    ? Thyroid disease   ? hypo  ? Tobacco dependence   ? Unspecified hereditary and idiopathic peripheral neuropathy   ? ?Past Surgical History:  ?Procedure Laterality Date  ? ANKLE RECONSTRUCTION    ? left ankle, pl

## 2021-10-24 NOTE — Patient Instructions (Addendum)
?MEDICARE ANNUAL WELLNESS VISIT ?Health Maintenance Summary and Written Plan of Care ? ?[name] , ? ?Thank you for allowing me to perform your Medicare Annual Wellness Visit and for your ongoing commitment to your health.  ? ?Health Maintenance & Immunization History ?Health Maintenance  ?Topic Date Due  ? OPHTHALMOLOGY EXAM  10/24/2021 (Originally 03/10/2021)  ? FOOT EXAM  10/25/2021 (Originally 05/01/2020)  ? COVID-19 Vaccine (2 - Janssen risk series) 11/09/2021 (Originally 11/28/2019)  ? URINE MICROALBUMIN  11/24/2021 (Originally 12/05/2017)  ? Zoster Vaccines- Shingrix (1 of 2) 01/24/2022 (Originally 08/22/1964)  ? TETANUS/TDAP  10/25/2022 (Originally 10/05/2021)  ? HEMOGLOBIN A1C  03/13/2022  ? Pneumonia Vaccine 15+ Years old  Completed  ? INFLUENZA VACCINE  Completed  ? DEXA SCAN  Completed  ? Hepatitis C Screening  Completed  ? HPV VACCINES  Aged Out  ? COLONOSCOPY (Pts 45-28yr Insurance coverage will need to be confirmed)  Discontinued  ? ?Immunization History  ?Administered Date(s) Administered  ? Fluad Quad(high Dose 65+) 05/02/2019, 06/13/2021  ? H1N1 06/19/2008  ? Influenza Split 05/06/2012  ? Influenza Whole 05/28/2006, 06/13/2007, 05/28/2008, 07/05/2009, 05/06/2010, 04/24/2011  ? Influenza, High Dose Seasonal PF 05/09/2018, 05/02/2019  ? Influenza,inj,Quad PF,6+ Mos 05/07/2013, 03/27/2014, 05/03/2015, 04/03/2016  ? Influenza-Unspecified 05/06/2020  ? Janssen (J&J) SARS-COV-2 Vaccination 10/31/2019  ? Pneumococcal Conjugate-13 04/27/2014  ? Pneumococcal Polysaccharide-23 06/07/2001, 08/15/2006, 01/09/2012  ? Tdap 10/06/2011  ? ? ?These are the patient goals that we discussed: ? Goals Addressed   ? ?  ?  ?  ?  ?  ? This Visit's Progress  ?   Patient Stated (pt-stated)     ?   Would like to have a good day where she wasn't in pain all the time due to her arthritis ?  ? ?  ?  ? ?This is a list of Health Maintenance Items that are overdue or due now: ?Td vaccine ?Eye exam ?Foot exam ?Urine Microalbumin ?Shingrix  vaccine ?Bone density screening ? ?Patient declined the shingrix vaccine at this time. ? ?Orders/Referrals Placed Today: ?Orders Placed This Encounter  ?Procedures  ? DEXAScan  ?  Standing Status:   Future  ?  Standing Expiration Date:   10/25/2022  ?  Scheduling Instructions:  ?   Please call patient's daughter ([name] to schedule.  ?  Order Specific Question:   Reason for exam:  ?  Answer:   Post menopausal  ?  Order Specific Question:   Preferred imaging location?  ?  Answer:   [facility] ? ? ?(Contact our referral department at [phone] you have not spoken with someone about your referral appointment within the next 5 days)  ? ? ?Follow-up Plan ?Follow-up with MHali Marry MD as planned ?Schedule your tetanus at your pharmacy.  ?Foot exam can be done at your podiatrist's office or in-office visit with Dr. MMadilyn Fireman  ?Urine Microalbumin can be done at your next appointment with Dr. MMadilyn Fireman ?Medicare wellness visit in one year.  ?AVS printed and mailed to the patient. ? ?  ?Health Maintenance, Female ?Adopting a healthy lifestyle and getting preventive care are important in promoting health and wellness. Ask your health care provider about: ?The right schedule for you to have regular tests and exams. ?Things you can do on your own to prevent diseases and keep yourself healthy. ?What should I know about diet, weight, and exercise? ?Eat a healthy diet ? ?Eat a diet that includes plenty of vegetables, fruits, low-fat dairy products, and lean protein. ?  Do not eat a lot of foods that are high in solid fats, added sugars, or sodium. ?Maintain a healthy weight ?Body mass index (BMI) is used to identify weight problems. It estimates body fat based on height and weight. Your health care provider can help determine your BMI and help you achieve or maintain a healthy weight. ?Get regular exercise ?Get regular exercise. This is one of the most important things you can do for your health. Most  adults should: ?Exercise for at least 150 minutes each week. The exercise should increase your heart rate and make you sweat (moderate-intensity exercise). ?Do strengthening exercises at least twice a week. This is in addition to the moderate-intensity exercise. ?Spend less time sitting. Even light physical activity can be beneficial. ?Watch cholesterol and blood lipids ?Have your blood tested for lipids and cholesterol at 77 years of age, then have this test every 5 years. ?Have your cholesterol levels checked more often if: ?Your lipid or cholesterol levels are high. ?You are older than 77 years of age. ?You are at high risk for heart disease. ?What should I know about cancer screening? ?Depending on your health history and family history, you may need to have cancer screening at various ages. This may include screening for: ?Breast cancer. ?Cervical cancer. ?Colorectal cancer. ?Skin cancer. ?Lung cancer. ?What should I know about heart disease, diabetes, and high blood pressure? ?Blood pressure and heart disease ?High blood pressure causes heart disease and increases the risk of stroke. This is more likely to develop in people who have high blood pressure readings or are overweight. ?Have your blood pressure checked: ?Every 3-5 years if you are 49-30 years of age. ?Every year if you are 51 years old or older. ?Diabetes ?Have regular diabetes screenings. This checks your fasting blood sugar level. Have the screening done: ?Once every three years after age 55 if you are at a normal weight and have a low risk for diabetes. ?More often and at a younger age if you are overweight or have a high risk for diabetes. ?What should I know about preventing infection? ?Hepatitis B ?If you have a higher risk for hepatitis B, you should be screened for this virus. Talk with your health care provider to find out if you are at risk for hepatitis B infection. ?Hepatitis C ?Testing is recommended for: ?Everyone born from [birth year]  through 1966. ?Anyone with known risk factors for hepatitis C. ?Sexually transmitted infections (STIs) ?Get screened for STIs, including gonorrhea and chlamydia, if: ?You are sexually active and are younger than 77 years of age. ?You are older than 77 years of age and your health care provider tells you that you are at risk for this type of infection. ?Your sexual activity has changed since you were last screened, and you are at increased risk for chlamydia or gonorrhea. Ask your health care provider if you are at risk. ?Ask your health care provider about whether you are at high risk for HIV. Your health care provider may recommend a prescription medicine to help prevent HIV infection. If you choose to take medicine to prevent HIV, you should first get tested for HIV. You should then be tested every 3 months for as long as you are taking the medicine. ?Pregnancy ?If you are about to stop having your period (premenopausal) and you may become pregnant, seek counseling before you get pregnant. ?Take 400 to 800 micrograms (mcg) of folic acid every day if you become pregnant. ?Ask for birth control (contraception) if  you want to prevent pregnancy. ?Osteoporosis and menopause ?Osteoporosis is a disease in which the bones lose minerals and strength with aging. This can result in bone fractures. If you are 18 years old or older, or if you are at risk for osteoporosis and fractures, ask your health care provider if you should: ?Be screened for bone loss. ?Take a calcium or vitamin D supplement to lower your risk of fractures. ?Be given hormone replacement therapy (HRT) to treat symptoms of menopause. ?Follow these instructions at home: ?Alcohol use ?Do not drink alcohol if: ?Your health care provider tells you not to drink. ?You are pregnant, may be pregnant, or are planning to become pregnant. ?If you drink alcohol: ?Limit how much you have to: ?0-1 drink a day. ?Know how much alcohol is in your drink. In the [place] one  drink equals one 12 oz bottle of beer (355 mL), one 5 oz glass of wine (148 mL), or one 1? oz glass of hard liquor (44 mL). ?Lifestyle ?Do not use any products that contain nicotine or tobacco. These products i

## 2021-10-27 ENCOUNTER — Telehealth: Payer: Self-pay

## 2021-10-27 DIAGNOSIS — J9611 Chronic respiratory failure with hypoxia: Secondary | ICD-10-CM | POA: Diagnosis not present

## 2021-10-27 DIAGNOSIS — I10 Essential (primary) hypertension: Secondary | ICD-10-CM | POA: Diagnosis not present

## 2021-10-27 DIAGNOSIS — K580 Irritable bowel syndrome with diarrhea: Secondary | ICD-10-CM | POA: Diagnosis not present

## 2021-10-27 DIAGNOSIS — E1165 Type 2 diabetes mellitus with hyperglycemia: Secondary | ICD-10-CM | POA: Diagnosis not present

## 2021-10-27 DIAGNOSIS — G6181 Chronic inflammatory demyelinating polyneuritis: Secondary | ICD-10-CM | POA: Diagnosis not present

## 2021-10-27 DIAGNOSIS — J449 Chronic obstructive pulmonary disease, unspecified: Secondary | ICD-10-CM | POA: Diagnosis not present

## 2021-10-27 NOTE — Telephone Encounter (Signed)
Duplicate

## 2021-10-27 NOTE — Telephone Encounter (Signed)
Lisabeth Pick PT called from [facility] requesting for verbal order to Recertify patient for maintenance program to continue to help with mobility. Nunzio Cory stated patient is responding very well. Verbal order is for 1 week 9 start date 11/01/2021. Nunzio Cory can be reached at [phone]. Forward to Dr. Madilyn Fireman.  ?

## 2021-10-28 NOTE — Telephone Encounter (Signed)
Nunzio Cory PT advised of verbal order.  ?

## 2021-10-28 NOTE — Telephone Encounter (Signed)
OK to approve verbal order. ?

## 2021-10-31 ENCOUNTER — Telehealth: Payer: Self-pay | Admitting: *Deleted

## 2021-10-31 DIAGNOSIS — Z20822 Contact with and (suspected) exposure to covid-19: Secondary | ICD-10-CM | POA: Diagnosis not present

## 2021-10-31 DIAGNOSIS — E1140 Type 2 diabetes mellitus with diabetic neuropathy, unspecified: Secondary | ICD-10-CM

## 2021-10-31 DIAGNOSIS — G4709 Other insomnia: Secondary | ICD-10-CM

## 2021-10-31 DIAGNOSIS — B370 Candidal stomatitis: Secondary | ICD-10-CM

## 2021-10-31 MED ORDER — LANTUS SOLOSTAR 100 UNIT/ML ~~LOC~~ SOPN: 42.0000 [IU] | PEN_INJECTOR | Freq: Every day | SUBCUTANEOUS | 1 refills | Status: DC

## 2021-10-31 NOTE — Telephone Encounter (Signed)
New prescription sent for Lantus with updated Sigg.  In regards to the insomnia I am more than happy to refer her to a sleep specialist but I do not really have anything else to offer her at this point. ?

## 2021-10-31 NOTE — Telephone Encounter (Signed)
Pt called and stated that she has been increasing her Lantus by 1 unit as she was directed to do on 2/28. In addition to taking Metformin XR 500 mg.  ? ?She is up to 42 units daily. Her BS this morning was 213. She reports that she will soon run out of the Lantus at this rate.  ? ?She is still struggling with insomnia. She is taking 2 melatonin tablets (10 mg), Ambien 5 mg and is not getting any sleep. She has tried [place] in the past this didn't work for her.  ? ?Lastly she would like a refill of the mouth wash  ?

## 2021-11-01 DIAGNOSIS — K219 Gastro-esophageal reflux disease without esophagitis: Secondary | ICD-10-CM | POA: Diagnosis not present

## 2021-11-01 DIAGNOSIS — E7800 Pure hypercholesterolemia, unspecified: Secondary | ICD-10-CM | POA: Diagnosis not present

## 2021-11-01 DIAGNOSIS — E039 Hypothyroidism, unspecified: Secondary | ICD-10-CM | POA: Diagnosis not present

## 2021-11-01 DIAGNOSIS — Z7951 Long term (current) use of inhaled steroids: Secondary | ICD-10-CM | POA: Diagnosis not present

## 2021-11-01 DIAGNOSIS — R339 Retention of urine, unspecified: Secondary | ICD-10-CM | POA: Diagnosis not present

## 2021-11-01 DIAGNOSIS — I10 Essential (primary) hypertension: Secondary | ICD-10-CM | POA: Diagnosis not present

## 2021-11-01 DIAGNOSIS — M1990 Unspecified osteoarthritis, unspecified site: Secondary | ICD-10-CM | POA: Diagnosis not present

## 2021-11-01 DIAGNOSIS — H3530 Unspecified macular degeneration: Secondary | ICD-10-CM | POA: Diagnosis not present

## 2021-11-01 DIAGNOSIS — F32A Depression, unspecified: Secondary | ICD-10-CM | POA: Diagnosis not present

## 2021-11-01 DIAGNOSIS — Z9181 History of falling: Secondary | ICD-10-CM | POA: Diagnosis not present

## 2021-11-01 DIAGNOSIS — E538 Deficiency of other specified B group vitamins: Secondary | ICD-10-CM | POA: Diagnosis not present

## 2021-11-01 DIAGNOSIS — J449 Chronic obstructive pulmonary disease, unspecified: Secondary | ICD-10-CM | POA: Diagnosis not present

## 2021-11-01 DIAGNOSIS — E1165 Type 2 diabetes mellitus with hyperglycemia: Secondary | ICD-10-CM | POA: Diagnosis not present

## 2021-11-01 DIAGNOSIS — Z9981 Dependence on supplemental oxygen: Secondary | ICD-10-CM | POA: Diagnosis not present

## 2021-11-01 DIAGNOSIS — J9611 Chronic respiratory failure with hypoxia: Secondary | ICD-10-CM | POA: Diagnosis not present

## 2021-11-01 DIAGNOSIS — G6181 Chronic inflammatory demyelinating polyneuritis: Secondary | ICD-10-CM | POA: Diagnosis not present

## 2021-11-01 DIAGNOSIS — E1136 Type 2 diabetes mellitus with diabetic cataract: Secondary | ICD-10-CM | POA: Diagnosis not present

## 2021-11-01 DIAGNOSIS — D638 Anemia in other chronic diseases classified elsewhere: Secondary | ICD-10-CM | POA: Diagnosis not present

## 2021-11-01 DIAGNOSIS — G4733 Obstructive sleep apnea (adult) (pediatric): Secondary | ICD-10-CM | POA: Diagnosis not present

## 2021-11-01 DIAGNOSIS — K580 Irritable bowel syndrome with diarrhea: Secondary | ICD-10-CM | POA: Diagnosis not present

## 2021-11-01 DIAGNOSIS — Z7902 Long term (current) use of antithrombotics/antiplatelets: Secondary | ICD-10-CM | POA: Diagnosis not present

## 2021-11-01 DIAGNOSIS — G600 Hereditary motor and sensory neuropathy: Secondary | ICD-10-CM | POA: Diagnosis not present

## 2021-11-01 MED ORDER — NYSTATIN 100000 UNIT/ML MT SUSP: 5.0000 mL | Freq: Four times a day (QID) | OROMUCOSAL | 0 refills | Status: DC

## 2021-11-01 NOTE — Telephone Encounter (Signed)
Referral placed for Dr. Michela Pitcher ?

## 2021-11-01 NOTE — Telephone Encounter (Signed)
Go ahead and place referral to neurology they usually have at least one physician who is board-certified in sleep medicine.  I think Dr. Michela Pitcher here in [place] might be. ?

## 2021-11-01 NOTE — Telephone Encounter (Signed)
Pt informed.  Who should referral placed to?  Refill sent for nystatin.  Charyl Bigger, CMA ?

## 2021-11-02 DIAGNOSIS — J449 Chronic obstructive pulmonary disease, unspecified: Secondary | ICD-10-CM | POA: Diagnosis not present

## 2021-11-02 DIAGNOSIS — G6181 Chronic inflammatory demyelinating polyneuritis: Secondary | ICD-10-CM | POA: Diagnosis not present

## 2021-11-02 DIAGNOSIS — E1165 Type 2 diabetes mellitus with hyperglycemia: Secondary | ICD-10-CM | POA: Diagnosis not present

## 2021-11-02 DIAGNOSIS — M1990 Unspecified osteoarthritis, unspecified site: Secondary | ICD-10-CM | POA: Diagnosis not present

## 2021-11-02 DIAGNOSIS — I10 Essential (primary) hypertension: Secondary | ICD-10-CM | POA: Diagnosis not present

## 2021-11-02 DIAGNOSIS — J9611 Chronic respiratory failure with hypoxia: Secondary | ICD-10-CM | POA: Diagnosis not present

## 2021-11-03 ENCOUNTER — Other Ambulatory Visit: Payer: Self-pay

## 2021-11-03 DIAGNOSIS — E1140 Type 2 diabetes mellitus with diabetic neuropathy, unspecified: Secondary | ICD-10-CM

## 2021-11-03 DIAGNOSIS — Z20828 Contact with and (suspected) exposure to other viral communicable diseases: Secondary | ICD-10-CM | POA: Diagnosis not present

## 2021-11-03 MED ORDER — LANTUS SOLOSTAR 100 UNIT/ML ~~LOC~~ SOPN: 42.0000 [IU] | PEN_INJECTOR | Freq: Every day | SUBCUTANEOUS | 1 refills | Status: DC

## 2021-11-04 ENCOUNTER — Other Ambulatory Visit: Payer: Self-pay

## 2021-11-04 ENCOUNTER — Other Ambulatory Visit: Payer: Self-pay | Admitting: Family Medicine

## 2021-11-04 DIAGNOSIS — Z20822 Contact with and (suspected) exposure to covid-19: Secondary | ICD-10-CM | POA: Diagnosis not present

## 2021-11-04 DIAGNOSIS — E1140 Type 2 diabetes mellitus with diabetic neuropathy, unspecified: Secondary | ICD-10-CM

## 2021-11-04 MED ORDER — LANTUS SOLOSTAR 100 UNIT/ML ~~LOC~~ SOPN: 42.0000 [IU] | PEN_INJECTOR | Freq: Every day | SUBCUTANEOUS | 1 refills | Status: DC

## 2021-11-07 DIAGNOSIS — G6181 Chronic inflammatory demyelinating polyneuritis: Secondary | ICD-10-CM | POA: Diagnosis not present

## 2021-11-07 DIAGNOSIS — E1165 Type 2 diabetes mellitus with hyperglycemia: Secondary | ICD-10-CM | POA: Diagnosis not present

## 2021-11-07 DIAGNOSIS — M1990 Unspecified osteoarthritis, unspecified site: Secondary | ICD-10-CM | POA: Diagnosis not present

## 2021-11-07 DIAGNOSIS — J9611 Chronic respiratory failure with hypoxia: Secondary | ICD-10-CM | POA: Diagnosis not present

## 2021-11-07 DIAGNOSIS — J449 Chronic obstructive pulmonary disease, unspecified: Secondary | ICD-10-CM | POA: Diagnosis not present

## 2021-11-07 DIAGNOSIS — I10 Essential (primary) hypertension: Secondary | ICD-10-CM | POA: Diagnosis not present

## 2021-11-08 DIAGNOSIS — Z20822 Contact with and (suspected) exposure to covid-19: Secondary | ICD-10-CM | POA: Diagnosis not present

## 2021-11-10 ENCOUNTER — Encounter: Payer: Self-pay | Admitting: Podiatry

## 2021-11-10 ENCOUNTER — Ambulatory Visit (INDEPENDENT_AMBULATORY_CARE_PROVIDER_SITE_OTHER): Payer: Medicare Other | Admitting: Podiatry

## 2021-11-10 DIAGNOSIS — B351 Tinea unguium: Secondary | ICD-10-CM | POA: Diagnosis not present

## 2021-11-10 DIAGNOSIS — M79674 Pain in right toe(s): Secondary | ICD-10-CM

## 2021-11-10 DIAGNOSIS — E1142 Type 2 diabetes mellitus with diabetic polyneuropathy: Secondary | ICD-10-CM | POA: Diagnosis not present

## 2021-11-10 DIAGNOSIS — M21372 Foot drop, left foot: Secondary | ICD-10-CM

## 2021-11-10 DIAGNOSIS — M21371 Foot drop, right foot: Secondary | ICD-10-CM

## 2021-11-10 DIAGNOSIS — G63 Polyneuropathy in diseases classified elsewhere: Secondary | ICD-10-CM | POA: Diagnosis not present

## 2021-11-10 DIAGNOSIS — M79675 Pain in left toe(s): Secondary | ICD-10-CM | POA: Diagnosis not present

## 2021-11-10 NOTE — Progress Notes (Signed)
?  Subjective:  ?Patient ID: [name], female    DOB: 03/06/1946,   MRN: [id number] ? ?Chief Complaint  ?Patient presents with  ? Nail Problem  ?   ?Diabetic foot care  ? ? ?76 y.o. female presents for concern of thickened elongated and painful nails that are difficult to trim. Requesting to have them trimmed today. Denies any burning or tingling in her feet. Patient is diabetic and last A1c was 14.  ? . Denies any other pedal complaints. Denies n/v/f/c.  ? ?PCP: [name] MD  ? ?Past Medical History:  ?Diagnosis Date  ? Arthritis   ? Charcot-Marie-Tooth disease   ? COPD (chronic obstructive pulmonary disease) ([place])   ? DDD (degenerative disc disease)   ? Lumbar and lumbosacral  ? Depression   ? Diabetes mellitus   ? type 2  ? Diabetic peripheral neuropathy ([place])   ? Facet syndrome, lumbar   ? Gastric ulcer   ? w/o hemorrhage  ? Hyperlipidemia   ? Hypertension   ? Insomnia   ? Lumbosacral root lesions, not elsewhere classified   ? Neurogenic bladder   ? Obesity   ? Osteopenia   ? Other symptoms referable to back   ? Post-menopausal   ? PUD (peptic ulcer disease)   ? 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 Spinal stenosis, lumbar region, without neurogenic claudication   ? Thoracic spondylosis without myelopathy   ? Thyroid disease   ? hypo  ? Tobacco dependence   ? Unspecified hereditary and idiopathic peripheral neuropathy   ? ? ?Objective:  ?Physical Exam: ?Vascular: DP/PT pulses 2/4 bilateral. CFT <3 seconds. Absent hair growth on digits. Edema noted to bilateral lower extremities. Xerosis noted bilaterally.  ?Skin. No lacerations or abrasions bilateral feet. Nails 1-5 bilateral  are thickened discolored and elongated with subungual debris. Maceration noted in 3rd and fourth interspaced bilateral.  ?Musculoskeletal: MMT 5/5 bilateral lower extremities in DF, PF, Inversion and Eversion. Deceased ROM in DF of ankle joint.  ?Neurological: Sensation intact to light touch. Protective  sensation diminished bilateral.  ? ? ?Assessment:  ? ?1. Polyneuropathy associated with underlying disease ([place])   ?2. Bilateral foot-drop   ?3. Type 2 diabetes mellitus with diabetic polyneuropathy, unspecified whether long term insulin use ([place])   ? ? ? ?Plan:  ?Patient was evaluated and treated and all questions answered. ?-Discussed and educated patient on diabetic foot care, especially with  ?regards to the vascular, neurological and musculoskeletal systems.  ?-Stressed the importance of good glycemic control and the detriment of not  ?controlling glucose levels in relation to the foot. ?-Discussed supportive shoes at all times and checking feet regularly.  ?-Mechanically debrided all nails 1-5 bilateral using sterile nail nipper and filed with dremel without incident  ?-Advised to keep betadine on the macerated areas in between toes.  ?-Answered all patient questions ?-Patient to return  in 3 months for at risk foot care ?-Patient advised to call the office if any problems or questions arise in the meantime. ?  ? ?Lorenda Peck, DPM  ? ? ?

## 2021-11-14 DIAGNOSIS — J9611 Chronic respiratory failure with hypoxia: Secondary | ICD-10-CM | POA: Diagnosis not present

## 2021-11-14 DIAGNOSIS — E1165 Type 2 diabetes mellitus with hyperglycemia: Secondary | ICD-10-CM | POA: Diagnosis not present

## 2021-11-14 DIAGNOSIS — I10 Essential (primary) hypertension: Secondary | ICD-10-CM | POA: Diagnosis not present

## 2021-11-14 DIAGNOSIS — J449 Chronic obstructive pulmonary disease, unspecified: Secondary | ICD-10-CM | POA: Diagnosis not present

## 2021-11-14 DIAGNOSIS — G6181 Chronic inflammatory demyelinating polyneuritis: Secondary | ICD-10-CM | POA: Diagnosis not present

## 2021-11-14 DIAGNOSIS — M1990 Unspecified osteoarthritis, unspecified site: Secondary | ICD-10-CM | POA: Diagnosis not present

## 2021-11-15 ENCOUNTER — Other Ambulatory Visit: Payer: Self-pay | Admitting: Family Medicine

## 2021-11-15 DIAGNOSIS — G6181 Chronic inflammatory demyelinating polyneuritis: Secondary | ICD-10-CM

## 2021-11-15 DIAGNOSIS — E1165 Type 2 diabetes mellitus with hyperglycemia: Secondary | ICD-10-CM

## 2021-11-15 DIAGNOSIS — R42 Dizziness and giddiness: Secondary | ICD-10-CM

## 2021-11-21 DIAGNOSIS — E1165 Type 2 diabetes mellitus with hyperglycemia: Secondary | ICD-10-CM | POA: Diagnosis not present

## 2021-11-21 DIAGNOSIS — G6181 Chronic inflammatory demyelinating polyneuritis: Secondary | ICD-10-CM | POA: Diagnosis not present

## 2021-11-21 DIAGNOSIS — J9611 Chronic respiratory failure with hypoxia: Secondary | ICD-10-CM | POA: Diagnosis not present

## 2021-11-21 DIAGNOSIS — M1990 Unspecified osteoarthritis, unspecified site: Secondary | ICD-10-CM | POA: Diagnosis not present

## 2021-11-21 DIAGNOSIS — I10 Essential (primary) hypertension: Secondary | ICD-10-CM | POA: Diagnosis not present

## 2021-11-21 DIAGNOSIS — J449 Chronic obstructive pulmonary disease, unspecified: Secondary | ICD-10-CM | POA: Diagnosis not present

## 2021-11-23 ENCOUNTER — Other Ambulatory Visit: Payer: Self-pay | Admitting: Family Medicine

## 2021-11-23 DIAGNOSIS — Z20822 Contact with and (suspected) exposure to covid-19: Secondary | ICD-10-CM | POA: Diagnosis not present

## 2021-11-25 DIAGNOSIS — Z20822 Contact with and (suspected) exposure to covid-19: Secondary | ICD-10-CM | POA: Diagnosis not present

## 2021-11-29 ENCOUNTER — Ambulatory Visit (INDEPENDENT_AMBULATORY_CARE_PROVIDER_SITE_OTHER): Payer: Medicare Other

## 2021-11-29 ENCOUNTER — Ambulatory Visit (INDEPENDENT_AMBULATORY_CARE_PROVIDER_SITE_OTHER): Payer: Medicare Other | Admitting: Sports Medicine

## 2021-11-29 ENCOUNTER — Telehealth: Payer: Self-pay

## 2021-11-29 DIAGNOSIS — M25511 Pain in right shoulder: Secondary | ICD-10-CM | POA: Diagnosis not present

## 2021-11-29 DIAGNOSIS — M1990 Unspecified osteoarthritis, unspecified site: Secondary | ICD-10-CM | POA: Diagnosis not present

## 2021-11-29 DIAGNOSIS — G8929 Other chronic pain: Secondary | ICD-10-CM

## 2021-11-29 DIAGNOSIS — I10 Essential (primary) hypertension: Secondary | ICD-10-CM | POA: Diagnosis not present

## 2021-11-29 DIAGNOSIS — E1165 Type 2 diabetes mellitus with hyperglycemia: Secondary | ICD-10-CM | POA: Diagnosis not present

## 2021-11-29 DIAGNOSIS — M25512 Pain in left shoulder: Secondary | ICD-10-CM

## 2021-11-29 DIAGNOSIS — G6181 Chronic inflammatory demyelinating polyneuritis: Secondary | ICD-10-CM | POA: Diagnosis not present

## 2021-11-29 DIAGNOSIS — J449 Chronic obstructive pulmonary disease, unspecified: Secondary | ICD-10-CM | POA: Diagnosis not present

## 2021-11-29 DIAGNOSIS — J9611 Chronic respiratory failure with hypoxia: Secondary | ICD-10-CM | POA: Diagnosis not present

## 2021-11-29 NOTE — Assessment & Plan Note (Signed)
[name] with multiple medical problems including chronic inflammatory demyelinating polyneuropathy, arthritic changes at multiple levels, cervical and lumbar DDD, CKD, etc. ?On and off for many years she has had pain in both shoulders localized over the deltoid. ?She wonders if it might be arthritis. ?On exam she has pretty good motion, she does have some positive impingement signs as well as a positive crank test, I do think she has a combination of cuff dysfunction and degenerative changes. ?Adding bilateral x-rays and we will have home health physical therapy work on her shoulders and rotator cuff for the next month, if insufficient improvement we will proceed with bilateral shoulder injections likely glenohumeral. ?She is currently homebound. ?

## 2021-11-29 NOTE — Telephone Encounter (Signed)
Patient was in the office today to see Dr. Darene Lamer and wanted to let you know that her feet are still swollen in the morning so she doesn't think her fluid pill is working. Also, she is not liking the Cymbalta. She said it isn't helping the depression or the pain.  ?

## 2021-11-29 NOTE — Progress Notes (Signed)
? ? ?  Procedures performed today:   ? ?None. ? ?Independent interpretation of notes and tests performed by another provider:  ? ?None. ? ?Brief History, Exam, Impression, and Recommendations:   ? ?Bilateral shoulder pain ?[name] with multiple medical problems including chronic inflammatory demyelinating polyneuropathy, arthritic changes at multiple levels, cervical and lumbar DDD, CKD, etc. ?On and off for many years she has had pain in both shoulders localized over the deltoid. ?She wonders if it might be arthritis. ?On exam she has pretty good motion, she does have some positive impingement signs as well as a positive crank test, I do think she has a combination of cuff dysfunction and degenerative changes. ?Adding bilateral x-rays and we will have home health physical therapy work on her shoulders and rotator cuff for the next month, if insufficient improvement we will proceed with bilateral shoulder injections likely glenohumeral. ?She is currently homebound. ? ? ? ?___________________________________________ ?Gwen Her. Dianah Field, M.D., ABFM., CAQSM. ?Primary Care and Sports Medicine ?[place] ? ?Adjunct Instructor of Family Medicine  ?[employer] ?

## 2021-11-30 MED ORDER — DULOXETINE HCL 60 MG PO CPEP: 60.0000 mg | ORAL_CAPSULE | Freq: Every day | ORAL | 0 refills | Status: DC

## 2021-11-30 NOTE — Telephone Encounter (Signed)
We will try increasing the Cymbalta to 60 mg new prescription sent.  She can try taking the Lasix daily for a few days in a row instead of every other day and see if that helps.  Make sure keeping them elevated and wearing compression socks though. ?

## 2021-11-30 NOTE — Telephone Encounter (Signed)
Patient advised of recommendations.  

## 2021-12-01 DIAGNOSIS — E039 Hypothyroidism, unspecified: Secondary | ICD-10-CM | POA: Diagnosis not present

## 2021-12-01 DIAGNOSIS — F32A Depression, unspecified: Secondary | ICD-10-CM | POA: Diagnosis not present

## 2021-12-01 DIAGNOSIS — G4733 Obstructive sleep apnea (adult) (pediatric): Secondary | ICD-10-CM | POA: Diagnosis not present

## 2021-12-01 DIAGNOSIS — D638 Anemia in other chronic diseases classified elsewhere: Secondary | ICD-10-CM | POA: Diagnosis not present

## 2021-12-01 DIAGNOSIS — M1990 Unspecified osteoarthritis, unspecified site: Secondary | ICD-10-CM | POA: Diagnosis not present

## 2021-12-01 DIAGNOSIS — K219 Gastro-esophageal reflux disease without esophagitis: Secondary | ICD-10-CM | POA: Diagnosis not present

## 2021-12-01 DIAGNOSIS — Z7902 Long term (current) use of antithrombotics/antiplatelets: Secondary | ICD-10-CM | POA: Diagnosis not present

## 2021-12-01 DIAGNOSIS — E538 Deficiency of other specified B group vitamins: Secondary | ICD-10-CM | POA: Diagnosis not present

## 2021-12-01 DIAGNOSIS — E1165 Type 2 diabetes mellitus with hyperglycemia: Secondary | ICD-10-CM | POA: Diagnosis not present

## 2021-12-01 DIAGNOSIS — G6181 Chronic inflammatory demyelinating polyneuritis: Secondary | ICD-10-CM | POA: Diagnosis not present

## 2021-12-01 DIAGNOSIS — Z9181 History of falling: Secondary | ICD-10-CM | POA: Diagnosis not present

## 2021-12-01 DIAGNOSIS — J449 Chronic obstructive pulmonary disease, unspecified: Secondary | ICD-10-CM | POA: Diagnosis not present

## 2021-12-01 DIAGNOSIS — E7800 Pure hypercholesterolemia, unspecified: Secondary | ICD-10-CM | POA: Diagnosis not present

## 2021-12-01 DIAGNOSIS — Z9981 Dependence on supplemental oxygen: Secondary | ICD-10-CM | POA: Diagnosis not present

## 2021-12-01 DIAGNOSIS — E1136 Type 2 diabetes mellitus with diabetic cataract: Secondary | ICD-10-CM | POA: Diagnosis not present

## 2021-12-01 DIAGNOSIS — K580 Irritable bowel syndrome with diarrhea: Secondary | ICD-10-CM | POA: Diagnosis not present

## 2021-12-01 DIAGNOSIS — Z7951 Long term (current) use of inhaled steroids: Secondary | ICD-10-CM | POA: Diagnosis not present

## 2021-12-01 DIAGNOSIS — J9611 Chronic respiratory failure with hypoxia: Secondary | ICD-10-CM | POA: Diagnosis not present

## 2021-12-01 DIAGNOSIS — H3530 Unspecified macular degeneration: Secondary | ICD-10-CM | POA: Diagnosis not present

## 2021-12-01 DIAGNOSIS — G600 Hereditary motor and sensory neuropathy: Secondary | ICD-10-CM | POA: Diagnosis not present

## 2021-12-01 DIAGNOSIS — I10 Essential (primary) hypertension: Secondary | ICD-10-CM | POA: Diagnosis not present

## 2021-12-01 DIAGNOSIS — R339 Retention of urine, unspecified: Secondary | ICD-10-CM | POA: Diagnosis not present

## 2021-12-05 DIAGNOSIS — J449 Chronic obstructive pulmonary disease, unspecified: Secondary | ICD-10-CM | POA: Diagnosis not present

## 2021-12-05 DIAGNOSIS — M1990 Unspecified osteoarthritis, unspecified site: Secondary | ICD-10-CM | POA: Diagnosis not present

## 2021-12-05 DIAGNOSIS — E1165 Type 2 diabetes mellitus with hyperglycemia: Secondary | ICD-10-CM | POA: Diagnosis not present

## 2021-12-05 DIAGNOSIS — G6181 Chronic inflammatory demyelinating polyneuritis: Secondary | ICD-10-CM | POA: Diagnosis not present

## 2021-12-05 DIAGNOSIS — I10 Essential (primary) hypertension: Secondary | ICD-10-CM | POA: Diagnosis not present

## 2021-12-05 DIAGNOSIS — J9611 Chronic respiratory failure with hypoxia: Secondary | ICD-10-CM | POA: Diagnosis not present

## 2021-12-05 DIAGNOSIS — Z20822 Contact with and (suspected) exposure to covid-19: Secondary | ICD-10-CM | POA: Diagnosis not present

## 2021-12-09 DIAGNOSIS — Z20822 Contact with and (suspected) exposure to covid-19: Secondary | ICD-10-CM | POA: Diagnosis not present

## 2021-12-10 DIAGNOSIS — Z20828 Contact with and (suspected) exposure to other viral communicable diseases: Secondary | ICD-10-CM | POA: Diagnosis not present

## 2021-12-12 ENCOUNTER — Other Ambulatory Visit: Payer: Self-pay | Admitting: Family Medicine

## 2021-12-12 DIAGNOSIS — Z20822 Contact with and (suspected) exposure to covid-19: Secondary | ICD-10-CM | POA: Diagnosis not present

## 2021-12-13 DIAGNOSIS — J449 Chronic obstructive pulmonary disease, unspecified: Secondary | ICD-10-CM | POA: Diagnosis not present

## 2021-12-13 DIAGNOSIS — G6181 Chronic inflammatory demyelinating polyneuritis: Secondary | ICD-10-CM | POA: Diagnosis not present

## 2021-12-13 DIAGNOSIS — I10 Essential (primary) hypertension: Secondary | ICD-10-CM | POA: Diagnosis not present

## 2021-12-13 DIAGNOSIS — E1165 Type 2 diabetes mellitus with hyperglycemia: Secondary | ICD-10-CM | POA: Diagnosis not present

## 2021-12-13 DIAGNOSIS — M1990 Unspecified osteoarthritis, unspecified site: Secondary | ICD-10-CM | POA: Diagnosis not present

## 2021-12-13 DIAGNOSIS — J9611 Chronic respiratory failure with hypoxia: Secondary | ICD-10-CM | POA: Diagnosis not present

## 2021-12-17 ENCOUNTER — Other Ambulatory Visit: Payer: Self-pay | Admitting: Family Medicine

## 2021-12-17 DIAGNOSIS — E1140 Type 2 diabetes mellitus with diabetic neuropathy, unspecified: Secondary | ICD-10-CM

## 2021-12-18 ENCOUNTER — Other Ambulatory Visit: Payer: Self-pay | Admitting: Family Medicine

## 2021-12-19 ENCOUNTER — Other Ambulatory Visit: Payer: Self-pay | Admitting: *Deleted

## 2021-12-19 MED ORDER — FUROSEMIDE 40 MG PO TABS: 40.0000 mg | ORAL_TABLET | ORAL | 1 refills | Status: DC

## 2021-12-20 DIAGNOSIS — M1990 Unspecified osteoarthritis, unspecified site: Secondary | ICD-10-CM | POA: Diagnosis not present

## 2021-12-20 DIAGNOSIS — J449 Chronic obstructive pulmonary disease, unspecified: Secondary | ICD-10-CM | POA: Diagnosis not present

## 2021-12-20 DIAGNOSIS — E1165 Type 2 diabetes mellitus with hyperglycemia: Secondary | ICD-10-CM | POA: Diagnosis not present

## 2021-12-20 DIAGNOSIS — J9611 Chronic respiratory failure with hypoxia: Secondary | ICD-10-CM | POA: Diagnosis not present

## 2021-12-20 DIAGNOSIS — I10 Essential (primary) hypertension: Secondary | ICD-10-CM | POA: Diagnosis not present

## 2021-12-20 DIAGNOSIS — G6181 Chronic inflammatory demyelinating polyneuritis: Secondary | ICD-10-CM | POA: Diagnosis not present

## 2021-12-27 ENCOUNTER — Ambulatory Visit: Payer: Medicare Other | Admitting: Sports Medicine

## 2021-12-27 DIAGNOSIS — G6181 Chronic inflammatory demyelinating polyneuritis: Secondary | ICD-10-CM | POA: Diagnosis not present

## 2021-12-27 DIAGNOSIS — I10 Essential (primary) hypertension: Secondary | ICD-10-CM | POA: Diagnosis not present

## 2021-12-27 DIAGNOSIS — J449 Chronic obstructive pulmonary disease, unspecified: Secondary | ICD-10-CM | POA: Diagnosis not present

## 2021-12-27 DIAGNOSIS — J9611 Chronic respiratory failure with hypoxia: Secondary | ICD-10-CM | POA: Diagnosis not present

## 2021-12-27 DIAGNOSIS — M1990 Unspecified osteoarthritis, unspecified site: Secondary | ICD-10-CM | POA: Diagnosis not present

## 2021-12-27 DIAGNOSIS — E1165 Type 2 diabetes mellitus with hyperglycemia: Secondary | ICD-10-CM | POA: Diagnosis not present

## 2021-12-29 ENCOUNTER — Telehealth: Payer: Self-pay

## 2021-12-29 NOTE — Telephone Encounter (Signed)
OK for verbal order for PT

## 2021-12-29 NOTE — Telephone Encounter (Signed)
VO given for PT

## 2021-12-29 NOTE — Telephone Encounter (Signed)
Lisabeth Pick from [facility] called requesting for verbal order for patient for PT Maintenance  starting next week 1 week. Nunzio Cory can be reached at [phone]. Forward to Dr. Madilyn Fireman for review.

## 2021-12-31 DIAGNOSIS — Z9981 Dependence on supplemental oxygen: Secondary | ICD-10-CM | POA: Diagnosis not present

## 2021-12-31 DIAGNOSIS — H3530 Unspecified macular degeneration: Secondary | ICD-10-CM | POA: Diagnosis not present

## 2021-12-31 DIAGNOSIS — E538 Deficiency of other specified B group vitamins: Secondary | ICD-10-CM | POA: Diagnosis not present

## 2021-12-31 DIAGNOSIS — Z7951 Long term (current) use of inhaled steroids: Secondary | ICD-10-CM | POA: Diagnosis not present

## 2021-12-31 DIAGNOSIS — G4733 Obstructive sleep apnea (adult) (pediatric): Secondary | ICD-10-CM | POA: Diagnosis not present

## 2021-12-31 DIAGNOSIS — E1136 Type 2 diabetes mellitus with diabetic cataract: Secondary | ICD-10-CM | POA: Diagnosis not present

## 2021-12-31 DIAGNOSIS — K580 Irritable bowel syndrome with diarrhea: Secondary | ICD-10-CM | POA: Diagnosis not present

## 2021-12-31 DIAGNOSIS — G6181 Chronic inflammatory demyelinating polyneuritis: Secondary | ICD-10-CM | POA: Diagnosis not present

## 2021-12-31 DIAGNOSIS — K219 Gastro-esophageal reflux disease without esophagitis: Secondary | ICD-10-CM | POA: Diagnosis not present

## 2021-12-31 DIAGNOSIS — M1990 Unspecified osteoarthritis, unspecified site: Secondary | ICD-10-CM | POA: Diagnosis not present

## 2021-12-31 DIAGNOSIS — Z9181 History of falling: Secondary | ICD-10-CM | POA: Diagnosis not present

## 2021-12-31 DIAGNOSIS — J9611 Chronic respiratory failure with hypoxia: Secondary | ICD-10-CM | POA: Diagnosis not present

## 2021-12-31 DIAGNOSIS — E7800 Pure hypercholesterolemia, unspecified: Secondary | ICD-10-CM | POA: Diagnosis not present

## 2021-12-31 DIAGNOSIS — J449 Chronic obstructive pulmonary disease, unspecified: Secondary | ICD-10-CM | POA: Diagnosis not present

## 2021-12-31 DIAGNOSIS — D638 Anemia in other chronic diseases classified elsewhere: Secondary | ICD-10-CM | POA: Diagnosis not present

## 2021-12-31 DIAGNOSIS — E039 Hypothyroidism, unspecified: Secondary | ICD-10-CM | POA: Diagnosis not present

## 2021-12-31 DIAGNOSIS — F32A Depression, unspecified: Secondary | ICD-10-CM | POA: Diagnosis not present

## 2021-12-31 DIAGNOSIS — Z7902 Long term (current) use of antithrombotics/antiplatelets: Secondary | ICD-10-CM | POA: Diagnosis not present

## 2021-12-31 DIAGNOSIS — G600 Hereditary motor and sensory neuropathy: Secondary | ICD-10-CM | POA: Diagnosis not present

## 2021-12-31 DIAGNOSIS — R339 Retention of urine, unspecified: Secondary | ICD-10-CM | POA: Diagnosis not present

## 2021-12-31 DIAGNOSIS — E1165 Type 2 diabetes mellitus with hyperglycemia: Secondary | ICD-10-CM | POA: Diagnosis not present

## 2021-12-31 DIAGNOSIS — I10 Essential (primary) hypertension: Secondary | ICD-10-CM | POA: Diagnosis not present

## 2022-01-04 ENCOUNTER — Ambulatory Visit: Payer: Medicare Other | Admitting: Sports Medicine

## 2022-01-05 ENCOUNTER — Other Ambulatory Visit: Payer: Self-pay | Admitting: Family Medicine

## 2022-01-05 DIAGNOSIS — G6181 Chronic inflammatory demyelinating polyneuritis: Secondary | ICD-10-CM | POA: Diagnosis not present

## 2022-01-05 DIAGNOSIS — E1165 Type 2 diabetes mellitus with hyperglycemia: Secondary | ICD-10-CM | POA: Diagnosis not present

## 2022-01-05 DIAGNOSIS — K219 Gastro-esophageal reflux disease without esophagitis: Secondary | ICD-10-CM

## 2022-01-05 DIAGNOSIS — J449 Chronic obstructive pulmonary disease, unspecified: Secondary | ICD-10-CM | POA: Diagnosis not present

## 2022-01-05 DIAGNOSIS — M1990 Unspecified osteoarthritis, unspecified site: Secondary | ICD-10-CM | POA: Diagnosis not present

## 2022-01-05 DIAGNOSIS — J9611 Chronic respiratory failure with hypoxia: Secondary | ICD-10-CM | POA: Diagnosis not present

## 2022-01-05 DIAGNOSIS — I10 Essential (primary) hypertension: Secondary | ICD-10-CM | POA: Diagnosis not present

## 2022-01-10 DIAGNOSIS — J9611 Chronic respiratory failure with hypoxia: Secondary | ICD-10-CM

## 2022-01-10 DIAGNOSIS — J449 Chronic obstructive pulmonary disease, unspecified: Secondary | ICD-10-CM

## 2022-01-11 ENCOUNTER — Ambulatory Visit: Payer: Medicare Other | Admitting: Sports Medicine

## 2022-01-12 ENCOUNTER — Other Ambulatory Visit: Payer: Self-pay | Admitting: Family Medicine

## 2022-01-13 DIAGNOSIS — M1990 Unspecified osteoarthritis, unspecified site: Secondary | ICD-10-CM | POA: Diagnosis not present

## 2022-01-13 DIAGNOSIS — E1165 Type 2 diabetes mellitus with hyperglycemia: Secondary | ICD-10-CM | POA: Diagnosis not present

## 2022-01-13 DIAGNOSIS — I10 Essential (primary) hypertension: Secondary | ICD-10-CM | POA: Diagnosis not present

## 2022-01-13 DIAGNOSIS — G6181 Chronic inflammatory demyelinating polyneuritis: Secondary | ICD-10-CM | POA: Diagnosis not present

## 2022-01-13 DIAGNOSIS — J9611 Chronic respiratory failure with hypoxia: Secondary | ICD-10-CM | POA: Diagnosis not present

## 2022-01-13 DIAGNOSIS — J449 Chronic obstructive pulmonary disease, unspecified: Secondary | ICD-10-CM | POA: Diagnosis not present

## 2022-01-16 ENCOUNTER — Other Ambulatory Visit: Payer: Self-pay | Admitting: Family Medicine

## 2022-01-20 DIAGNOSIS — I10 Essential (primary) hypertension: Secondary | ICD-10-CM | POA: Diagnosis not present

## 2022-01-20 DIAGNOSIS — J9611 Chronic respiratory failure with hypoxia: Secondary | ICD-10-CM | POA: Diagnosis not present

## 2022-01-20 DIAGNOSIS — E1165 Type 2 diabetes mellitus with hyperglycemia: Secondary | ICD-10-CM | POA: Diagnosis not present

## 2022-01-20 DIAGNOSIS — M1990 Unspecified osteoarthritis, unspecified site: Secondary | ICD-10-CM | POA: Diagnosis not present

## 2022-01-20 DIAGNOSIS — G6181 Chronic inflammatory demyelinating polyneuritis: Secondary | ICD-10-CM | POA: Diagnosis not present

## 2022-01-20 DIAGNOSIS — J449 Chronic obstructive pulmonary disease, unspecified: Secondary | ICD-10-CM | POA: Diagnosis not present

## 2022-01-25 ENCOUNTER — Ambulatory Visit (INDEPENDENT_AMBULATORY_CARE_PROVIDER_SITE_OTHER): Payer: Medicare Other

## 2022-01-25 ENCOUNTER — Ambulatory Visit (INDEPENDENT_AMBULATORY_CARE_PROVIDER_SITE_OTHER): Payer: Medicare Other | Admitting: Sports Medicine

## 2022-01-25 DIAGNOSIS — M25512 Pain in left shoulder: Secondary | ICD-10-CM

## 2022-01-25 DIAGNOSIS — M25511 Pain in right shoulder: Secondary | ICD-10-CM

## 2022-01-25 DIAGNOSIS — M19011 Primary osteoarthritis, right shoulder: Secondary | ICD-10-CM

## 2022-01-25 DIAGNOSIS — M19012 Primary osteoarthritis, left shoulder: Secondary | ICD-10-CM | POA: Diagnosis not present

## 2022-01-25 DIAGNOSIS — G8929 Other chronic pain: Secondary | ICD-10-CM

## 2022-01-25 NOTE — Progress Notes (Signed)
    Procedures performed today:    Procedure: Real-time Ultrasound Guided injection of the left glenohumeral joint Device: Samsung HS60  Verbal informed consent obtained.  Time-out conducted.  Noted no overlying erythema, induration, or other signs of local infection.  Skin prepped in a sterile fashion.  Local anesthesia: Topical Ethyl chloride.  With sterile technique and under real time ultrasound guidance: Noted arthritic joint, 1 cc Kenalog 40, 2 cc lidocaine, 2 cc bupivacaine injected easily Completed without difficulty  Advised to call if fevers/chills, erythema, induration, drainage, or persistent bleeding.  Images permanently stored and available for review in PACS.  Impression: Technically successful ultrasound guided injection.  Procedure: Real-time Ultrasound Guided injection of the right glenohumeral joint Device: Samsung HS60  Verbal informed consent obtained.  Time-out conducted.  Noted no overlying erythema, induration, or other signs of local infection.  Skin prepped in a sterile fashion.  Local anesthesia: Topical Ethyl chloride.  With sterile technique and under real time ultrasound guidance: Noted arthritic joint, 1 cc Kenalog 40, 2 cc lidocaine, 2 cc bupivacaine injected easily Completed without difficulty  Advised to call if fevers/chills, erythema, induration, drainage, or persistent bleeding.  Images permanently stored and available for review in PACS.  Impression: Technically successful ultrasound guided injection.  Independent interpretation of notes and tests performed by another provider:   None.  Brief History, Exam, Impression, and Recommendations:    Primary osteoarthritis of both shoulders [name], she is long history of bilateral shoulder pain, x-rays at the last visit did show significant osteoarthritis, I do think that her humeral heads are somewhat high riding suggestive of chronic rotator cuff tearing. We  tried some medications and home health physical therapy without significant improvement. For this reason we will inject both glenohumeral joints today with ultrasound guidance with a 6-week follow-up with me.    ___________________________________________ Gwen Her. Dianah Field, M.D., ABFM., CAQSM. Primary Care and [place] Instructor of [place] [employer] [facility] [employer]

## 2022-01-25 NOTE — Assessment & Plan Note (Signed)
[name], she is long history of bilateral shoulder pain, x-rays at the last visit did show significant osteoarthritis, I do think that her humeral heads are somewhat high riding suggestive of chronic rotator cuff tearing. We tried some medications and home health physical therapy without significant improvement. For this reason we will inject both glenohumeral joints today with ultrasound guidance with a 6-week follow-up with me.

## 2022-01-27 DIAGNOSIS — I10 Essential (primary) hypertension: Secondary | ICD-10-CM | POA: Diagnosis not present

## 2022-01-27 DIAGNOSIS — E1165 Type 2 diabetes mellitus with hyperglycemia: Secondary | ICD-10-CM | POA: Diagnosis not present

## 2022-01-27 DIAGNOSIS — M1990 Unspecified osteoarthritis, unspecified site: Secondary | ICD-10-CM | POA: Diagnosis not present

## 2022-01-27 DIAGNOSIS — G6181 Chronic inflammatory demyelinating polyneuritis: Secondary | ICD-10-CM | POA: Diagnosis not present

## 2022-01-27 DIAGNOSIS — J9611 Chronic respiratory failure with hypoxia: Secondary | ICD-10-CM | POA: Diagnosis not present

## 2022-01-27 DIAGNOSIS — J449 Chronic obstructive pulmonary disease, unspecified: Secondary | ICD-10-CM | POA: Diagnosis not present

## 2022-01-30 DIAGNOSIS — J9611 Chronic respiratory failure with hypoxia: Secondary | ICD-10-CM | POA: Diagnosis not present

## 2022-01-30 DIAGNOSIS — Z7951 Long term (current) use of inhaled steroids: Secondary | ICD-10-CM | POA: Diagnosis not present

## 2022-01-30 DIAGNOSIS — G4733 Obstructive sleep apnea (adult) (pediatric): Secondary | ICD-10-CM | POA: Diagnosis not present

## 2022-01-30 DIAGNOSIS — K219 Gastro-esophageal reflux disease without esophagitis: Secondary | ICD-10-CM | POA: Diagnosis not present

## 2022-01-30 DIAGNOSIS — Z7902 Long term (current) use of antithrombotics/antiplatelets: Secondary | ICD-10-CM | POA: Diagnosis not present

## 2022-01-30 DIAGNOSIS — F32A Depression, unspecified: Secondary | ICD-10-CM | POA: Diagnosis not present

## 2022-01-30 DIAGNOSIS — K580 Irritable bowel syndrome with diarrhea: Secondary | ICD-10-CM | POA: Diagnosis not present

## 2022-01-30 DIAGNOSIS — M1990 Unspecified osteoarthritis, unspecified site: Secondary | ICD-10-CM | POA: Diagnosis not present

## 2022-01-30 DIAGNOSIS — E7800 Pure hypercholesterolemia, unspecified: Secondary | ICD-10-CM | POA: Diagnosis not present

## 2022-01-30 DIAGNOSIS — E1165 Type 2 diabetes mellitus with hyperglycemia: Secondary | ICD-10-CM | POA: Diagnosis not present

## 2022-01-30 DIAGNOSIS — R339 Retention of urine, unspecified: Secondary | ICD-10-CM | POA: Diagnosis not present

## 2022-01-30 DIAGNOSIS — Z9181 History of falling: Secondary | ICD-10-CM | POA: Diagnosis not present

## 2022-01-30 DIAGNOSIS — D638 Anemia in other chronic diseases classified elsewhere: Secondary | ICD-10-CM | POA: Diagnosis not present

## 2022-01-30 DIAGNOSIS — Z9981 Dependence on supplemental oxygen: Secondary | ICD-10-CM | POA: Diagnosis not present

## 2022-01-30 DIAGNOSIS — I10 Essential (primary) hypertension: Secondary | ICD-10-CM | POA: Diagnosis not present

## 2022-01-30 DIAGNOSIS — E1136 Type 2 diabetes mellitus with diabetic cataract: Secondary | ICD-10-CM | POA: Diagnosis not present

## 2022-01-30 DIAGNOSIS — E039 Hypothyroidism, unspecified: Secondary | ICD-10-CM | POA: Diagnosis not present

## 2022-01-30 DIAGNOSIS — G600 Hereditary motor and sensory neuropathy: Secondary | ICD-10-CM | POA: Diagnosis not present

## 2022-01-30 DIAGNOSIS — J449 Chronic obstructive pulmonary disease, unspecified: Secondary | ICD-10-CM | POA: Diagnosis not present

## 2022-01-30 DIAGNOSIS — H3530 Unspecified macular degeneration: Secondary | ICD-10-CM | POA: Diagnosis not present

## 2022-01-30 DIAGNOSIS — E538 Deficiency of other specified B group vitamins: Secondary | ICD-10-CM | POA: Diagnosis not present

## 2022-01-30 DIAGNOSIS — G6181 Chronic inflammatory demyelinating polyneuritis: Secondary | ICD-10-CM | POA: Diagnosis not present

## 2022-01-31 DIAGNOSIS — M1990 Unspecified osteoarthritis, unspecified site: Secondary | ICD-10-CM | POA: Diagnosis not present

## 2022-01-31 DIAGNOSIS — J9611 Chronic respiratory failure with hypoxia: Secondary | ICD-10-CM | POA: Diagnosis not present

## 2022-01-31 DIAGNOSIS — I10 Essential (primary) hypertension: Secondary | ICD-10-CM | POA: Diagnosis not present

## 2022-01-31 DIAGNOSIS — G6181 Chronic inflammatory demyelinating polyneuritis: Secondary | ICD-10-CM | POA: Diagnosis not present

## 2022-01-31 DIAGNOSIS — E1165 Type 2 diabetes mellitus with hyperglycemia: Secondary | ICD-10-CM | POA: Diagnosis not present

## 2022-01-31 DIAGNOSIS — J449 Chronic obstructive pulmonary disease, unspecified: Secondary | ICD-10-CM | POA: Diagnosis not present

## 2022-02-01 DIAGNOSIS — G6181 Chronic inflammatory demyelinating polyneuritis: Secondary | ICD-10-CM | POA: Diagnosis not present

## 2022-02-01 DIAGNOSIS — Z79620 Long term (current) use of immunosuppressive biologic: Secondary | ICD-10-CM | POA: Diagnosis not present

## 2022-02-05 ENCOUNTER — Other Ambulatory Visit: Payer: Self-pay | Admitting: Family Medicine

## 2022-02-08 DIAGNOSIS — M1990 Unspecified osteoarthritis, unspecified site: Secondary | ICD-10-CM | POA: Diagnosis not present

## 2022-02-08 DIAGNOSIS — J9611 Chronic respiratory failure with hypoxia: Secondary | ICD-10-CM | POA: Diagnosis not present

## 2022-02-08 DIAGNOSIS — G6181 Chronic inflammatory demyelinating polyneuritis: Secondary | ICD-10-CM | POA: Diagnosis not present

## 2022-02-08 DIAGNOSIS — J449 Chronic obstructive pulmonary disease, unspecified: Secondary | ICD-10-CM | POA: Diagnosis not present

## 2022-02-08 DIAGNOSIS — E1165 Type 2 diabetes mellitus with hyperglycemia: Secondary | ICD-10-CM | POA: Diagnosis not present

## 2022-02-08 DIAGNOSIS — I10 Essential (primary) hypertension: Secondary | ICD-10-CM | POA: Diagnosis not present

## 2022-02-12 ENCOUNTER — Other Ambulatory Visit: Payer: Self-pay | Admitting: Family Medicine

## 2022-02-12 DIAGNOSIS — E1140 Type 2 diabetes mellitus with diabetic neuropathy, unspecified: Secondary | ICD-10-CM

## 2022-02-15 ENCOUNTER — Other Ambulatory Visit: Payer: Self-pay | Admitting: Family Medicine

## 2022-02-15 DIAGNOSIS — K591 Functional diarrhea: Secondary | ICD-10-CM

## 2022-02-16 ENCOUNTER — Ambulatory Visit: Payer: Medicare Other | Admitting: Podiatry

## 2022-02-17 DIAGNOSIS — M1990 Unspecified osteoarthritis, unspecified site: Secondary | ICD-10-CM | POA: Diagnosis not present

## 2022-02-17 DIAGNOSIS — G6181 Chronic inflammatory demyelinating polyneuritis: Secondary | ICD-10-CM | POA: Diagnosis not present

## 2022-02-17 DIAGNOSIS — J449 Chronic obstructive pulmonary disease, unspecified: Secondary | ICD-10-CM | POA: Diagnosis not present

## 2022-02-17 DIAGNOSIS — E1165 Type 2 diabetes mellitus with hyperglycemia: Secondary | ICD-10-CM | POA: Diagnosis not present

## 2022-02-17 DIAGNOSIS — I10 Essential (primary) hypertension: Secondary | ICD-10-CM | POA: Diagnosis not present

## 2022-02-17 DIAGNOSIS — J9611 Chronic respiratory failure with hypoxia: Secondary | ICD-10-CM | POA: Diagnosis not present

## 2022-02-20 ENCOUNTER — Other Ambulatory Visit: Payer: Self-pay | Admitting: Family Medicine

## 2022-02-23 DIAGNOSIS — E1165 Type 2 diabetes mellitus with hyperglycemia: Secondary | ICD-10-CM | POA: Diagnosis not present

## 2022-02-23 DIAGNOSIS — I10 Essential (primary) hypertension: Secondary | ICD-10-CM | POA: Diagnosis not present

## 2022-02-23 DIAGNOSIS — J9611 Chronic respiratory failure with hypoxia: Secondary | ICD-10-CM | POA: Diagnosis not present

## 2022-02-23 DIAGNOSIS — M1990 Unspecified osteoarthritis, unspecified site: Secondary | ICD-10-CM | POA: Diagnosis not present

## 2022-02-23 DIAGNOSIS — G6181 Chronic inflammatory demyelinating polyneuritis: Secondary | ICD-10-CM | POA: Diagnosis not present

## 2022-02-23 DIAGNOSIS — J449 Chronic obstructive pulmonary disease, unspecified: Secondary | ICD-10-CM | POA: Diagnosis not present

## 2022-02-24 ENCOUNTER — Ambulatory Visit: Payer: Medicare Other | Admitting: Podiatry

## 2022-02-24 ENCOUNTER — Other Ambulatory Visit: Payer: Self-pay | Admitting: Family Medicine

## 2022-02-27 DIAGNOSIS — G6181 Chronic inflammatory demyelinating polyneuritis: Secondary | ICD-10-CM | POA: Diagnosis not present

## 2022-02-27 DIAGNOSIS — E1165 Type 2 diabetes mellitus with hyperglycemia: Secondary | ICD-10-CM | POA: Diagnosis not present

## 2022-02-27 DIAGNOSIS — M1990 Unspecified osteoarthritis, unspecified site: Secondary | ICD-10-CM | POA: Diagnosis not present

## 2022-02-27 DIAGNOSIS — I10 Essential (primary) hypertension: Secondary | ICD-10-CM | POA: Diagnosis not present

## 2022-02-27 DIAGNOSIS — J9611 Chronic respiratory failure with hypoxia: Secondary | ICD-10-CM | POA: Diagnosis not present

## 2022-02-27 DIAGNOSIS — J449 Chronic obstructive pulmonary disease, unspecified: Secondary | ICD-10-CM | POA: Diagnosis not present

## 2022-02-28 ENCOUNTER — Telehealth: Payer: Self-pay | Admitting: *Deleted

## 2022-02-28 NOTE — Telephone Encounter (Signed)
LVM giving VO for [name] at [facility] for PT for Ms. Child psychotherapist

## 2022-03-01 DIAGNOSIS — D638 Anemia in other chronic diseases classified elsewhere: Secondary | ICD-10-CM | POA: Diagnosis not present

## 2022-03-01 DIAGNOSIS — K580 Irritable bowel syndrome with diarrhea: Secondary | ICD-10-CM | POA: Diagnosis not present

## 2022-03-01 DIAGNOSIS — E039 Hypothyroidism, unspecified: Secondary | ICD-10-CM | POA: Diagnosis not present

## 2022-03-01 DIAGNOSIS — M1990 Unspecified osteoarthritis, unspecified site: Secondary | ICD-10-CM | POA: Diagnosis not present

## 2022-03-01 DIAGNOSIS — Z794 Long term (current) use of insulin: Secondary | ICD-10-CM | POA: Diagnosis not present

## 2022-03-01 DIAGNOSIS — Z7951 Long term (current) use of inhaled steroids: Secondary | ICD-10-CM | POA: Diagnosis not present

## 2022-03-01 DIAGNOSIS — G600 Hereditary motor and sensory neuropathy: Secondary | ICD-10-CM | POA: Diagnosis not present

## 2022-03-01 DIAGNOSIS — I10 Essential (primary) hypertension: Secondary | ICD-10-CM | POA: Diagnosis not present

## 2022-03-01 DIAGNOSIS — Z9981 Dependence on supplemental oxygen: Secondary | ICD-10-CM | POA: Diagnosis not present

## 2022-03-01 DIAGNOSIS — K219 Gastro-esophageal reflux disease without esophagitis: Secondary | ICD-10-CM | POA: Diagnosis not present

## 2022-03-01 DIAGNOSIS — J449 Chronic obstructive pulmonary disease, unspecified: Secondary | ICD-10-CM | POA: Diagnosis not present

## 2022-03-01 DIAGNOSIS — Z7902 Long term (current) use of antithrombotics/antiplatelets: Secondary | ICD-10-CM | POA: Diagnosis not present

## 2022-03-01 DIAGNOSIS — G4733 Obstructive sleep apnea (adult) (pediatric): Secondary | ICD-10-CM | POA: Diagnosis not present

## 2022-03-01 DIAGNOSIS — J9611 Chronic respiratory failure with hypoxia: Secondary | ICD-10-CM | POA: Diagnosis not present

## 2022-03-01 DIAGNOSIS — E1165 Type 2 diabetes mellitus with hyperglycemia: Secondary | ICD-10-CM | POA: Diagnosis not present

## 2022-03-01 DIAGNOSIS — G6181 Chronic inflammatory demyelinating polyneuritis: Secondary | ICD-10-CM | POA: Diagnosis not present

## 2022-03-01 DIAGNOSIS — R339 Retention of urine, unspecified: Secondary | ICD-10-CM | POA: Diagnosis not present

## 2022-03-01 DIAGNOSIS — F32A Depression, unspecified: Secondary | ICD-10-CM | POA: Diagnosis not present

## 2022-03-01 DIAGNOSIS — E7800 Pure hypercholesterolemia, unspecified: Secondary | ICD-10-CM | POA: Diagnosis not present

## 2022-03-01 DIAGNOSIS — Z9181 History of falling: Secondary | ICD-10-CM | POA: Diagnosis not present

## 2022-03-01 DIAGNOSIS — H3530 Unspecified macular degeneration: Secondary | ICD-10-CM | POA: Diagnosis not present

## 2022-03-01 DIAGNOSIS — E1136 Type 2 diabetes mellitus with diabetic cataract: Secondary | ICD-10-CM | POA: Diagnosis not present

## 2022-03-01 DIAGNOSIS — E538 Deficiency of other specified B group vitamins: Secondary | ICD-10-CM | POA: Diagnosis not present

## 2022-03-02 ENCOUNTER — Ambulatory Visit: Payer: Medicare Other | Admitting: Podiatry

## 2022-03-03 DIAGNOSIS — J9611 Chronic respiratory failure with hypoxia: Secondary | ICD-10-CM | POA: Diagnosis not present

## 2022-03-03 DIAGNOSIS — M1990 Unspecified osteoarthritis, unspecified site: Secondary | ICD-10-CM | POA: Diagnosis not present

## 2022-03-03 DIAGNOSIS — G6181 Chronic inflammatory demyelinating polyneuritis: Secondary | ICD-10-CM | POA: Diagnosis not present

## 2022-03-03 DIAGNOSIS — J449 Chronic obstructive pulmonary disease, unspecified: Secondary | ICD-10-CM | POA: Diagnosis not present

## 2022-03-03 DIAGNOSIS — I10 Essential (primary) hypertension: Secondary | ICD-10-CM | POA: Diagnosis not present

## 2022-03-03 DIAGNOSIS — E1165 Type 2 diabetes mellitus with hyperglycemia: Secondary | ICD-10-CM | POA: Diagnosis not present

## 2022-03-09 DIAGNOSIS — E1165 Type 2 diabetes mellitus with hyperglycemia: Secondary | ICD-10-CM | POA: Diagnosis not present

## 2022-03-09 DIAGNOSIS — G6181 Chronic inflammatory demyelinating polyneuritis: Secondary | ICD-10-CM | POA: Diagnosis not present

## 2022-03-09 DIAGNOSIS — M1990 Unspecified osteoarthritis, unspecified site: Secondary | ICD-10-CM | POA: Diagnosis not present

## 2022-03-09 DIAGNOSIS — J9611 Chronic respiratory failure with hypoxia: Secondary | ICD-10-CM | POA: Diagnosis not present

## 2022-03-09 DIAGNOSIS — J449 Chronic obstructive pulmonary disease, unspecified: Secondary | ICD-10-CM | POA: Diagnosis not present

## 2022-03-09 DIAGNOSIS — I10 Essential (primary) hypertension: Secondary | ICD-10-CM | POA: Diagnosis not present

## 2022-03-10 ENCOUNTER — Ambulatory Visit: Payer: Medicare Other | Admitting: Sports Medicine

## 2022-03-10 ENCOUNTER — Other Ambulatory Visit: Payer: Self-pay | Admitting: Family Medicine

## 2022-03-10 DIAGNOSIS — R42 Dizziness and giddiness: Secondary | ICD-10-CM

## 2022-03-14 ENCOUNTER — Other Ambulatory Visit: Payer: Self-pay | Admitting: Family Medicine

## 2022-03-16 ENCOUNTER — Ambulatory Visit: Payer: Medicare Other | Admitting: Podiatry

## 2022-03-16 DIAGNOSIS — M1990 Unspecified osteoarthritis, unspecified site: Secondary | ICD-10-CM | POA: Diagnosis not present

## 2022-03-16 DIAGNOSIS — J449 Chronic obstructive pulmonary disease, unspecified: Secondary | ICD-10-CM | POA: Diagnosis not present

## 2022-03-16 DIAGNOSIS — I10 Essential (primary) hypertension: Secondary | ICD-10-CM | POA: Diagnosis not present

## 2022-03-16 DIAGNOSIS — G6181 Chronic inflammatory demyelinating polyneuritis: Secondary | ICD-10-CM | POA: Diagnosis not present

## 2022-03-16 DIAGNOSIS — E1165 Type 2 diabetes mellitus with hyperglycemia: Secondary | ICD-10-CM | POA: Diagnosis not present

## 2022-03-16 DIAGNOSIS — J9611 Chronic respiratory failure with hypoxia: Secondary | ICD-10-CM | POA: Diagnosis not present

## 2022-03-20 ENCOUNTER — Other Ambulatory Visit: Payer: Self-pay | Admitting: Family Medicine

## 2022-03-20 DIAGNOSIS — J9611 Chronic respiratory failure with hypoxia: Secondary | ICD-10-CM | POA: Diagnosis not present

## 2022-03-20 DIAGNOSIS — G6181 Chronic inflammatory demyelinating polyneuritis: Secondary | ICD-10-CM | POA: Diagnosis not present

## 2022-03-20 DIAGNOSIS — M1990 Unspecified osteoarthritis, unspecified site: Secondary | ICD-10-CM | POA: Diagnosis not present

## 2022-03-20 DIAGNOSIS — J449 Chronic obstructive pulmonary disease, unspecified: Secondary | ICD-10-CM | POA: Diagnosis not present

## 2022-03-20 DIAGNOSIS — Z794 Long term (current) use of insulin: Secondary | ICD-10-CM

## 2022-03-20 DIAGNOSIS — I10 Essential (primary) hypertension: Secondary | ICD-10-CM | POA: Diagnosis not present

## 2022-03-20 DIAGNOSIS — E1165 Type 2 diabetes mellitus with hyperglycemia: Secondary | ICD-10-CM | POA: Diagnosis not present

## 2022-03-28 ENCOUNTER — Other Ambulatory Visit: Payer: Self-pay

## 2022-03-28 DIAGNOSIS — I10 Essential (primary) hypertension: Secondary | ICD-10-CM | POA: Diagnosis not present

## 2022-03-28 DIAGNOSIS — J9611 Chronic respiratory failure with hypoxia: Secondary | ICD-10-CM | POA: Diagnosis not present

## 2022-03-28 DIAGNOSIS — J449 Chronic obstructive pulmonary disease, unspecified: Secondary | ICD-10-CM | POA: Diagnosis not present

## 2022-03-28 DIAGNOSIS — G6181 Chronic inflammatory demyelinating polyneuritis: Secondary | ICD-10-CM | POA: Diagnosis not present

## 2022-03-28 DIAGNOSIS — E1165 Type 2 diabetes mellitus with hyperglycemia: Secondary | ICD-10-CM | POA: Diagnosis not present

## 2022-03-28 DIAGNOSIS — M1990 Unspecified osteoarthritis, unspecified site: Secondary | ICD-10-CM | POA: Diagnosis not present

## 2022-03-28 MED ORDER — FUROSEMIDE 40 MG PO TABS: 40.0000 mg | ORAL_TABLET | ORAL | 0 refills | Status: DC

## 2022-03-31 DIAGNOSIS — E7800 Pure hypercholesterolemia, unspecified: Secondary | ICD-10-CM | POA: Diagnosis not present

## 2022-03-31 DIAGNOSIS — Z9181 History of falling: Secondary | ICD-10-CM | POA: Diagnosis not present

## 2022-03-31 DIAGNOSIS — I10 Essential (primary) hypertension: Secondary | ICD-10-CM | POA: Diagnosis not present

## 2022-03-31 DIAGNOSIS — E1136 Type 2 diabetes mellitus with diabetic cataract: Secondary | ICD-10-CM | POA: Diagnosis not present

## 2022-03-31 DIAGNOSIS — H3530 Unspecified macular degeneration: Secondary | ICD-10-CM | POA: Diagnosis not present

## 2022-03-31 DIAGNOSIS — K219 Gastro-esophageal reflux disease without esophagitis: Secondary | ICD-10-CM | POA: Diagnosis not present

## 2022-03-31 DIAGNOSIS — E1165 Type 2 diabetes mellitus with hyperglycemia: Secondary | ICD-10-CM | POA: Diagnosis not present

## 2022-03-31 DIAGNOSIS — R339 Retention of urine, unspecified: Secondary | ICD-10-CM | POA: Diagnosis not present

## 2022-03-31 DIAGNOSIS — G6181 Chronic inflammatory demyelinating polyneuritis: Secondary | ICD-10-CM | POA: Diagnosis not present

## 2022-03-31 DIAGNOSIS — Z7951 Long term (current) use of inhaled steroids: Secondary | ICD-10-CM | POA: Diagnosis not present

## 2022-03-31 DIAGNOSIS — Z7902 Long term (current) use of antithrombotics/antiplatelets: Secondary | ICD-10-CM | POA: Diagnosis not present

## 2022-03-31 DIAGNOSIS — Z9981 Dependence on supplemental oxygen: Secondary | ICD-10-CM | POA: Diagnosis not present

## 2022-03-31 DIAGNOSIS — Z794 Long term (current) use of insulin: Secondary | ICD-10-CM | POA: Diagnosis not present

## 2022-03-31 DIAGNOSIS — G4733 Obstructive sleep apnea (adult) (pediatric): Secondary | ICD-10-CM | POA: Diagnosis not present

## 2022-03-31 DIAGNOSIS — J449 Chronic obstructive pulmonary disease, unspecified: Secondary | ICD-10-CM | POA: Diagnosis not present

## 2022-03-31 DIAGNOSIS — D638 Anemia in other chronic diseases classified elsewhere: Secondary | ICD-10-CM | POA: Diagnosis not present

## 2022-03-31 DIAGNOSIS — K580 Irritable bowel syndrome with diarrhea: Secondary | ICD-10-CM | POA: Diagnosis not present

## 2022-03-31 DIAGNOSIS — J9611 Chronic respiratory failure with hypoxia: Secondary | ICD-10-CM | POA: Diagnosis not present

## 2022-03-31 DIAGNOSIS — F32A Depression, unspecified: Secondary | ICD-10-CM | POA: Diagnosis not present

## 2022-03-31 DIAGNOSIS — G600 Hereditary motor and sensory neuropathy: Secondary | ICD-10-CM | POA: Diagnosis not present

## 2022-03-31 DIAGNOSIS — E039 Hypothyroidism, unspecified: Secondary | ICD-10-CM | POA: Diagnosis not present

## 2022-03-31 DIAGNOSIS — M1990 Unspecified osteoarthritis, unspecified site: Secondary | ICD-10-CM | POA: Diagnosis not present

## 2022-03-31 DIAGNOSIS — E538 Deficiency of other specified B group vitamins: Secondary | ICD-10-CM | POA: Diagnosis not present

## 2022-04-04 ENCOUNTER — Telehealth: Payer: Self-pay

## 2022-04-04 NOTE — Telephone Encounter (Signed)
I am not opposed to upping her dose especially if she finds it more helpful but we have to recheck her renal function if she is taking it every day instead of every other day.  She has not had labs in 6 months.  We will need a BMP and A1c

## 2022-04-04 NOTE — Telephone Encounter (Signed)
Pt informed.  Pt expressed understanding and is agreeable.  T. Marilena Trevathan, CMA  

## 2022-04-06 ENCOUNTER — Encounter: Payer: Self-pay | Admitting: Podiatry

## 2022-04-06 ENCOUNTER — Ambulatory Visit (INDEPENDENT_AMBULATORY_CARE_PROVIDER_SITE_OTHER): Payer: Medicare Other | Admitting: Podiatry

## 2022-04-06 DIAGNOSIS — M79675 Pain in left toe(s): Secondary | ICD-10-CM | POA: Diagnosis not present

## 2022-04-06 DIAGNOSIS — M79674 Pain in right toe(s): Secondary | ICD-10-CM | POA: Diagnosis not present

## 2022-04-06 DIAGNOSIS — B351 Tinea unguium: Secondary | ICD-10-CM | POA: Diagnosis not present

## 2022-04-06 DIAGNOSIS — G63 Polyneuropathy in diseases classified elsewhere: Secondary | ICD-10-CM

## 2022-04-06 DIAGNOSIS — E1142 Type 2 diabetes mellitus with diabetic polyneuropathy: Secondary | ICD-10-CM

## 2022-04-06 NOTE — Progress Notes (Signed)
  Subjective:  Patient ID: Shandria Clinch, female    DOB: 1945/12/08,   MRN: [id number]  No chief complaint on file.   76 y.o. female presents for concern of thickened elongated and painful nails that are difficult to trim. Requesting to have them trimmed today. Denies any burning or tingling in her feet. Patient is diabetic and last A1c was 14.   . Denies any other pedal complaints. Denies n/v/f/c.   PCP: [name] MD   Past Medical History:  Diagnosis Date   Arthritis    Charcot-Marie-Tooth disease    COPD (chronic obstructive pulmonary disease) ([place])    DDD (degenerative disc disease)    Lumbar and lumbosacral   Depression    Diabetes mellitus    type 2   Diabetic peripheral neuropathy ([facility])    Facet syndrome, lumbar    Gastric ulcer    w/o hemorrhage   Hyperlipidemia    Hypertension    Insomnia    Lumbosacral root lesions, not elsewhere classified    Neurogenic bladder    Obesity    Osteopenia    Other symptoms referable to back    Post-menopausal    PUD (peptic ulcer disease)    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 CFT <3 seconds. Absent hair growth on digits. Edema noted to bilateral lower extremities. Xerosis noted bilaterally.  Skin. No lacerations or abrasions bilateral feet. Nails 1-5 bilateral  are thickened discolored and elongated with subungual debris. Maceration noted in 3rd and fourth interspaced bilateral.  Musculoskeletal: MMT 5/5 bilateral lower extremities in DF, PF, Inversion and Eversion. Deceased ROM in DF of ankle joint.  Neurological: Sensation intact to light touch. Protective sensation diminished bilateral.    Assessment:    1. Pain due to onychomycosis of toenails of both feet   2. Polyneuropathy associated with underlying disease ([place])   3. Type 2 diabetes mellitus with diabetic polyneuropathy, unspecified whether long term insulin use ([place])      Plan:  Patient was evaluated and treated and all questions answered. -Discussed and educated patient on diabetic foot care, especially with  regards to the vascular, neurological and musculoskeletal systems.  -Stressed the importance of good glycemic control and the detriment of not  controlling glucose levels in relation to the foot. -Discussed supportive shoes at all times and checking feet regularly.  -Mechanically debrided all nails 1-5 bilateral using sterile nail nipper and filed with dremel without incident  -Advised to keep betadine on the macerated areas in between toes.  -Answered all patient questions -Patient to return  in 3 months for at risk foot care -Patient advised to call the office if any problems or questions arise in the meantime.    Lorenda Peck, DPM

## 2022-04-07 DIAGNOSIS — J449 Chronic obstructive pulmonary disease, unspecified: Secondary | ICD-10-CM | POA: Diagnosis not present

## 2022-04-07 DIAGNOSIS — E1165 Type 2 diabetes mellitus with hyperglycemia: Secondary | ICD-10-CM | POA: Diagnosis not present

## 2022-04-07 DIAGNOSIS — J9611 Chronic respiratory failure with hypoxia: Secondary | ICD-10-CM | POA: Diagnosis not present

## 2022-04-07 DIAGNOSIS — I10 Essential (primary) hypertension: Secondary | ICD-10-CM | POA: Diagnosis not present

## 2022-04-07 DIAGNOSIS — G6181 Chronic inflammatory demyelinating polyneuritis: Secondary | ICD-10-CM | POA: Diagnosis not present

## 2022-04-07 DIAGNOSIS — M1990 Unspecified osteoarthritis, unspecified site: Secondary | ICD-10-CM | POA: Diagnosis not present

## 2022-04-08 ENCOUNTER — Other Ambulatory Visit: Payer: Self-pay | Admitting: Family Medicine

## 2022-04-08 DIAGNOSIS — E1140 Type 2 diabetes mellitus with diabetic neuropathy, unspecified: Secondary | ICD-10-CM

## 2022-04-13 ENCOUNTER — Telehealth: Payer: Self-pay | Admitting: Neurology

## 2022-04-13 DIAGNOSIS — E1140 Type 2 diabetes mellitus with diabetic neuropathy, unspecified: Secondary | ICD-10-CM

## 2022-04-13 DIAGNOSIS — G6181 Chronic inflammatory demyelinating polyneuritis: Secondary | ICD-10-CM | POA: Diagnosis not present

## 2022-04-13 DIAGNOSIS — I10 Essential (primary) hypertension: Secondary | ICD-10-CM | POA: Diagnosis not present

## 2022-04-13 DIAGNOSIS — E1165 Type 2 diabetes mellitus with hyperglycemia: Secondary | ICD-10-CM | POA: Diagnosis not present

## 2022-04-13 DIAGNOSIS — J9611 Chronic respiratory failure with hypoxia: Secondary | ICD-10-CM | POA: Diagnosis not present

## 2022-04-13 DIAGNOSIS — M1990 Unspecified osteoarthritis, unspecified site: Secondary | ICD-10-CM | POA: Diagnosis not present

## 2022-04-13 DIAGNOSIS — J449 Chronic obstructive pulmonary disease, unspecified: Secondary | ICD-10-CM | POA: Diagnosis not present

## 2022-04-13 NOTE — Telephone Encounter (Signed)
Patient called and LVM wanting to make sure her lab orders are in the system. Note from end of August states to order, but they were not placed. Order placed. Called patient and made her aware.

## 2022-04-14 ENCOUNTER — Telehealth: Payer: Self-pay

## 2022-04-19 DIAGNOSIS — J9611 Chronic respiratory failure with hypoxia: Secondary | ICD-10-CM | POA: Diagnosis not present

## 2022-04-19 DIAGNOSIS — J449 Chronic obstructive pulmonary disease, unspecified: Secondary | ICD-10-CM | POA: Diagnosis not present

## 2022-04-19 DIAGNOSIS — M1990 Unspecified osteoarthritis, unspecified site: Secondary | ICD-10-CM | POA: Diagnosis not present

## 2022-04-19 DIAGNOSIS — G6181 Chronic inflammatory demyelinating polyneuritis: Secondary | ICD-10-CM | POA: Diagnosis not present

## 2022-04-19 DIAGNOSIS — I10 Essential (primary) hypertension: Secondary | ICD-10-CM | POA: Diagnosis not present

## 2022-04-19 DIAGNOSIS — E1165 Type 2 diabetes mellitus with hyperglycemia: Secondary | ICD-10-CM | POA: Diagnosis not present

## 2022-04-24 ENCOUNTER — Other Ambulatory Visit: Payer: Self-pay | Admitting: Family Medicine

## 2022-04-26 DIAGNOSIS — M1990 Unspecified osteoarthritis, unspecified site: Secondary | ICD-10-CM | POA: Diagnosis not present

## 2022-04-26 DIAGNOSIS — G6181 Chronic inflammatory demyelinating polyneuritis: Secondary | ICD-10-CM | POA: Diagnosis not present

## 2022-04-26 DIAGNOSIS — J9611 Chronic respiratory failure with hypoxia: Secondary | ICD-10-CM | POA: Diagnosis not present

## 2022-04-26 DIAGNOSIS — I10 Essential (primary) hypertension: Secondary | ICD-10-CM | POA: Diagnosis not present

## 2022-04-26 DIAGNOSIS — J449 Chronic obstructive pulmonary disease, unspecified: Secondary | ICD-10-CM | POA: Diagnosis not present

## 2022-04-26 DIAGNOSIS — E1165 Type 2 diabetes mellitus with hyperglycemia: Secondary | ICD-10-CM | POA: Diagnosis not present

## 2022-05-04 NOTE — Telephone Encounter (Signed)
error 

## 2022-05-05 ENCOUNTER — Other Ambulatory Visit: Payer: Self-pay | Admitting: Family Medicine

## 2022-05-09 ENCOUNTER — Ambulatory Visit: Payer: Medicare Other | Admitting: Family Medicine

## 2022-05-10 ENCOUNTER — Other Ambulatory Visit: Payer: Self-pay | Admitting: Family Medicine

## 2022-05-11 ENCOUNTER — Ambulatory Visit: Payer: Medicare Other | Admitting: Family Medicine

## 2022-05-15 ENCOUNTER — Ambulatory Visit (INDEPENDENT_AMBULATORY_CARE_PROVIDER_SITE_OTHER): Payer: Medicare Other | Admitting: Family Medicine

## 2022-05-15 ENCOUNTER — Encounter: Payer: Self-pay | Admitting: Family Medicine

## 2022-05-15 VITALS — BP 128/73 | HR 65

## 2022-05-15 DIAGNOSIS — L820 Inflamed seborrheic keratosis: Secondary | ICD-10-CM

## 2022-05-15 DIAGNOSIS — E1140 Type 2 diabetes mellitus with diabetic neuropathy, unspecified: Secondary | ICD-10-CM

## 2022-05-15 DIAGNOSIS — M25572 Pain in left ankle and joints of left foot: Secondary | ICD-10-CM | POA: Diagnosis not present

## 2022-05-15 DIAGNOSIS — Z23 Encounter for immunization: Secondary | ICD-10-CM

## 2022-05-15 DIAGNOSIS — G8929 Other chronic pain: Secondary | ICD-10-CM

## 2022-05-15 DIAGNOSIS — B372 Candidiasis of skin and nail: Secondary | ICD-10-CM | POA: Diagnosis not present

## 2022-05-15 DIAGNOSIS — R29898 Other symptoms and signs involving the musculoskeletal system: Secondary | ICD-10-CM

## 2022-05-15 DIAGNOSIS — R21 Rash and other nonspecific skin eruption: Secondary | ICD-10-CM

## 2022-05-15 MED ORDER — NYSTATIN-TRIAMCINOLONE 100000-0.1 UNIT/GM-% EX OINT: 1.0000 | TOPICAL_OINTMENT | Freq: Two times a day (BID) | CUTANEOUS | 1 refills | Status: DC

## 2022-05-15 MED ORDER — FUROSEMIDE 40 MG PO TABS: 40.0000 mg | ORAL_TABLET | Freq: Every day | ORAL | 1 refills | Status: DC

## 2022-05-15 MED ORDER — FLUCONAZOLE 150 MG PO TABS: 150.0000 mg | ORAL_TABLET | ORAL | 0 refills | Status: DC

## 2022-05-15 NOTE — Progress Notes (Signed)
Established Patient Office Visit  Subjective   Patient ID: [name], female    DOB: 01/25/1946  Age: 76 y.o. MRN: [id number]  Chief Complaint  Patient presents with   Skin Problem    HPI  She made the appointment today for some fleshy lesions on her abdomen mostly in the crease of her pannus.  They have been there for quite some time.  Sometimes they burn and sometimes they itch.  Lesions on her abdomen - Tried Neosporin, betadine, and cornstarch.   Abdominal pain for months when she bends and rotates. Pain is mostly at t the base of her ribs bilaterally.  The pain will even radiate into the lower pelvic area.  Again mostly just happens when she is rotating and bends over to pick something up.  Also has a skin lesion on her low back that is been there for quite some time it does tend to catch on her clothing and sometimes when she does try to pull her underwear up.  He also reports that her left ankle has been easily rolling outward.  She actually used to wear splints on both ankles to help with foot drop but after working with PT for most a year she really does not need them anymore and they were actually causing more discomfort so she is quit wearing them.     ROS    Objective:     BP 128/73   Pulse 65   SpO2 95%    Physical Exam Vitals reviewed.  Constitutional:      Appearance: She is well-developed.  HENT:     Head: Normocephalic and atraumatic.  Eyes:     Conjunctiva/sclera: Conjunctivae normal.  Cardiovascular:     Rate and Rhythm: Normal rate.  Pulmonary:     Effort: Pulmonary effort is normal.  Skin:    General: Skin is dry.     Coloration: Skin is not pale.  Neurological:     Mental Status: She is alert and oriented to person, place, and time.  Psychiatric:        Behavior: Behavior normal.    On her low back she has a pretty thick brown hyperkeratotic lesion measuring approximately 1 cm over her lower mid back.  No results found for any  visits on 05/15/22.    The ASCVD Risk score (Arnett DK, et al., 2019) failed to calculate for the following reasons:   The patient has a prior MI or stroke diagnosis    Assessment & Plan:   Problem List Items Addressed This Visit       Endocrine   Diabetes mellitus with neuropathy [facility])    Encourage her to schedule a follow up for diabetes.       Relevant Orders   Lipid Panel w/reflex Direct LDL   COMPLETE METABOLIC PANEL WITH GFR   CBC   Hemoglobin A1c   Other Visit Diagnoses     Flu vaccine need    -  Primary   Relevant Orders   Flu Vaccine QUAD High Dose(Fluad) (Completed)   Rash       Relevant Medications   fluconazole (DIFLUCAN) 150 MG tablet   nystatin-triamcinolone ointment (MYCOLOG)   Other Relevant Orders   Lipid Panel w/reflex Direct LDL   COMPLETE METABOLIC PANEL WITH GFR   CBC   Hemoglobin A1c   Yeast infection of the skin       Relevant Medications   fluconazole (DIFLUCAN) 150 MG tablet   nystatin-triamcinolone ointment (  MYCOLOG)   Seborrheic keratoses, inflamed       Chronic pain of left ankle          Seborrheic keratosis.  Recommended treatment with cryotherapy.  Patient tolerated well.  Follow-up wound care discussed.  Rash - most consistent with yeast candidals. Will tx with oral diflucan and topical mycolog.    Bilat rib pain  - with bending over and flexion.  Likely from the ribs.    Left ankle weakness -gave her an ASO today for support that she can take on and off.  She might benefit from physical therapy as well.  Now she will try the support and see if that is helpful.   Cryotherapy Procedure Note  Pre-operative Diagnosis: Inflamed seborrheic keratosis  Post-operative Diagnosis: same  Locations: mid low back   Indications: irritation from pant  Anesthesia: not needed.   Procedure Details  Patient informed of risks (permanent scarring, infection, light or dark discoloration, bleeding, infection, weakness, numbness and  recurrence of the lesion) and benefits of the procedure and verbal informed consent obtained.  The areas are treated with liquid nitrogen therapy, frozen until ice ball extended 1-2 mm beyond lesion, allowed to thaw, and treated again. The patient tolerated procedure well.  The patient was instructed on post-op care, warned that there may be blister formation, redness and pain. Recommend OTC analgesia as needed for pain.  Condition: Stable  Complications: none.  Plan: 1. Instructed to keep the area dry and covered for 24-48h and clean thereafter. 2. Warning signs of infection were reviewed.   3. Recommended that the patient use OTC acetaminophen as needed for pain.  4. Return PRN   No follow-ups on file.   I spent 40 minutes on the day of the encounter to include pre-visit record review, face-to-face time with the patient and post visit ordering of test.   [name], MD

## 2022-05-15 NOTE — Assessment & Plan Note (Signed)
Encourage her to schedule a follow up for diabetes.

## 2022-05-16 ENCOUNTER — Other Ambulatory Visit: Payer: Self-pay | Admitting: Family Medicine

## 2022-05-16 LAB — COMPLETE METABOLIC PANEL WITH GFR
AG Ratio: 0.9 (calc) — ABNORMAL LOW (ref 1.0–2.5)
ALT: 8 U/L (ref 6–29)
AST: 11 U/L (ref 10–35)
Albumin: 3.6 g/dL (ref 3.6–5.1)
Alkaline phosphatase (APISO): 64 U/L (ref 37–153)
BUN: 14 mg/dL (ref 7–25)
CO2: 23 mmol/L (ref 20–32)
Calcium: 9.8 mg/dL (ref 8.6–10.4)
Chloride: 103 mmol/L (ref 98–110)
Creat: 0.96 mg/dL (ref 0.60–1.00)
Globulin: 4 g/dL (calc) — ABNORMAL HIGH (ref 1.9–3.7)
Glucose, Bld: 147 mg/dL — ABNORMAL HIGH (ref 65–99)
Potassium: 4.7 mmol/L (ref 3.5–5.3)
Sodium: 139 mmol/L (ref 135–146)
Total Bilirubin: 0.9 mg/dL (ref 0.2–1.2)
Total Protein: 7.6 g/dL (ref 6.1–8.1)
eGFR: 61 mL/min/{1.73_m2} (ref >=60)

## 2022-05-16 LAB — LIPID PANEL W/REFLEX DIRECT LDL
Cholesterol: 82 mg/dL (ref <200)
HDL: 36 mg/dL — ABNORMAL LOW (ref >=50)
LDL Cholesterol (Calc): 23 mg/dL (calc)
Non-HDL Cholesterol (Calc): 46 mg/dL (calc) (ref <130)
Total CHOL/HDL Ratio: 2.3 (calc) (ref <5.0)
Triglycerides: 157 mg/dL — ABNORMAL HIGH (ref <150)

## 2022-05-16 LAB — HEMOGLOBIN A1C
Hgb A1c MFr Bld: 8.6 % of total Hgb — ABNORMAL HIGH (ref <5.7)
Mean Plasma Glucose: 200 mg/dL
eAG (mmol/L): 11.1 mmol/L

## 2022-05-16 LAB — CBC
HCT: 38.3 % (ref 35.0–45.0)
Hemoglobin: 12.3 g/dL (ref 11.7–15.5)
MCH: 28.8 pg (ref 27.0–33.0)
MCHC: 32.1 g/dL (ref 32.0–36.0)
MCV: 89.7 fL (ref 80.0–100.0)
MPV: 11.5 fL (ref 7.5–12.5)
Platelets: 225 10*3/uL (ref 140–400)
RBC: 4.27 10*6/uL (ref 3.80–5.10)
RDW: 14.8 % (ref 11.0–15.0)
WBC: 6.2 10*3/uL (ref 3.8–10.8)

## 2022-05-16 MED ORDER — METFORMIN HCL ER 500 MG PO TB24: 500.0000 mg | ORAL_TABLET | Freq: Every day | ORAL | 1 refills | Status: DC

## 2022-05-16 NOTE — Progress Notes (Signed)
Kidney function looks stable.  If anything it actually looks a little bit better than last time.  Also please let us know when she has had her last eye exam so that we can get her chart updated.

## 2022-05-16 NOTE — Progress Notes (Signed)
I recommend that she see an eye doctor that is an ophthalmologist not just an optometrist.  [facility] here in town has 2 ophthalmologists in her local.

## 2022-05-16 NOTE — Progress Notes (Signed)
Call patient: Cholesterol looks good.  A1c looks much better to at 8.6 it was up to 14.  Please try to increase your metformin to 2 tabs daily.  I will send an updated prescription.  If you feel like it is causing some frequent loose stools then let me know we can always try something different.  But lets see if you do well with 2 tabs first.  We just need to get back to where it was last fall around 6.  But it is trending downward which is good.  Her hemoglobin is a little low at 10.7 so you are mildly anemic.  Are you noticing any blood in your urine or stool?

## 2022-05-20 ENCOUNTER — Other Ambulatory Visit: Payer: Self-pay | Admitting: Family Medicine

## 2022-05-20 DIAGNOSIS — E1140 Type 2 diabetes mellitus with diabetic neuropathy, unspecified: Secondary | ICD-10-CM

## 2022-05-24 ENCOUNTER — Telehealth: Payer: Self-pay

## 2022-05-24 MED ORDER — METFORMIN HCL ER 500 MG PO TB24: 1000.0000 mg | ORAL_TABLET | Freq: Every day | ORAL | 1 refills | Status: DC

## 2022-05-24 NOTE — Telephone Encounter (Signed)
Meds ordered this encounter  Medications   metFORMIN (GLUCOPHAGE-XR) 500 MG 24 hr tablet    Sig: Take 2 tablets (1,000 mg total) by mouth daily with breakfast.    Dispense:  180 tablet    Refill:  1    This prescription was filled on 12/12/2021. Any refills authorized will be placed on file.

## 2022-05-24 NOTE — Telephone Encounter (Signed)
Task completed. Patient has been updated regarding the metformin rx. Aware that a rx has been sent to the pharmacy. No other inquiries during the call.

## 2022-06-06 DIAGNOSIS — G8929 Other chronic pain: Secondary | ICD-10-CM | POA: Diagnosis not present

## 2022-06-06 DIAGNOSIS — Z79899 Other long term (current) drug therapy: Secondary | ICD-10-CM | POA: Diagnosis not present

## 2022-06-06 DIAGNOSIS — G6181 Chronic inflammatory demyelinating polyneuritis: Secondary | ICD-10-CM | POA: Diagnosis not present

## 2022-06-08 ENCOUNTER — Telehealth: Payer: Self-pay | Admitting: Family Medicine

## 2022-06-08 NOTE — Telephone Encounter (Signed)
Patient called in to state that mole frozen off her back a few weeks ago still remains and wishes to be seen again. Patient, due to someone else needing to transport her, needs an appointment on a Monday, Wednesday, or a Friday at [place], your typically assigned virtual. Per triage advice, did you want to handle this issue or send her to dermatology? Please advise. Buel Ream

## 2022-06-08 NOTE — Telephone Encounter (Signed)
Okay to schedule during that time just grab another slot and hold it for a same day instead.

## 2022-06-09 ENCOUNTER — Other Ambulatory Visit: Payer: Self-pay | Admitting: Family Medicine

## 2022-06-13 NOTE — Telephone Encounter (Signed)
Called several times, got patient scheduled on 11/07 for 11/20 in the afternoon. [name]

## 2022-06-16 ENCOUNTER — Telehealth: Payer: Self-pay | Admitting: *Deleted

## 2022-06-16 DIAGNOSIS — B370 Candidal stomatitis: Secondary | ICD-10-CM

## 2022-06-16 MED ORDER — NYSTATIN 100000 UNIT/ML MT SUSP: 5.0000 mL | Freq: Four times a day (QID) | OROMUCOSAL | 0 refills | Status: DC

## 2022-06-16 NOTE — Telephone Encounter (Signed)
Pt called stating that she is experiencing thrush from her medication would like mouthwash sent to her pharmacy.

## 2022-06-19 ENCOUNTER — Other Ambulatory Visit: Payer: Self-pay | Admitting: Family Medicine

## 2022-06-19 DIAGNOSIS — Z794 Long term (current) use of insulin: Secondary | ICD-10-CM

## 2022-06-26 ENCOUNTER — Ambulatory Visit: Payer: Medicare Other | Admitting: Family Medicine

## 2022-06-26 ENCOUNTER — Other Ambulatory Visit: Payer: Self-pay | Admitting: Family Medicine

## 2022-06-26 DIAGNOSIS — R42 Dizziness and giddiness: Secondary | ICD-10-CM

## 2022-07-06 ENCOUNTER — Encounter: Payer: Self-pay | Admitting: Podiatrist

## 2022-07-06 ENCOUNTER — Ambulatory Visit (INDEPENDENT_AMBULATORY_CARE_PROVIDER_SITE_OTHER): Payer: Medicare Other | Admitting: Podiatrist

## 2022-07-06 DIAGNOSIS — E1142 Type 2 diabetes mellitus with diabetic polyneuropathy: Secondary | ICD-10-CM

## 2022-07-06 DIAGNOSIS — B351 Tinea unguium: Secondary | ICD-10-CM

## 2022-07-06 DIAGNOSIS — M79674 Pain in right toe(s): Secondary | ICD-10-CM | POA: Diagnosis not present

## 2022-07-06 DIAGNOSIS — M79675 Pain in left toe(s): Secondary | ICD-10-CM | POA: Diagnosis not present

## 2022-07-06 NOTE — Progress Notes (Signed)
  Subjective:  Patient ID: [name], female    DOB: May 20, 1946,   MRN: [id number]  Chief Complaint  Patient presents with   Nail Problem    RFC    76 y.o. female presents for concern of thickened elongated and painful nails that are difficult to trim. Requesting to have them trimmed today. Denies any burning or tingling in her feet. Patient is diabetic and last A1c was 8.6 on 05/15/22.  She presents today in a wheelchair with assistance from her daughter.    PCP: [name] MD   Past Medical History:  Diagnosis Date   Arthritis    Charcot-Marie-Tooth disease    COPD (chronic obstructive pulmonary disease) ([place])    DDD (degenerative disc disease)    Lumbar and lumbosacral   Depression    Diabetes mellitus    type 2   Diabetic peripheral neuropathy ([facility])    Facet syndrome, lumbar    Gastric ulcer    w/o hemorrhage   Hyperlipidemia    Hypertension    Insomnia    Lumbosacral root lesions, not elsewhere classified    Neurogenic bladder    Obesity    Osteopenia    Other symptoms referable to back    Post-menopausal    PUD (peptic ulcer disease)    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   Spinal stenosis, lumbar region, without neurogenic claudication    Thoracic spondylosis without myelopathy    Thyroid disease    hypo   Tobacco dependence    Unspecified hereditary and idiopathic peripheral neuropathy     Objective:  Physical Exam: Vascular: DP/PT pulses 1/4 bilateral. CFT <3 seconds. Absent hair growth on digits. Edema noted to bilateral lower extremities. Xerosis noted bilaterally. Rubor on dependancy noted.  Skin. No lacerations or abrasions bilateral feet. Skin is thin, and shiny on feet and lower legs.  Nails 1-5 bilateral are thickened discolored and elongated with subungual debris. Maceration noted in 3rd and fourth interspaced bilateral.  Musculoskeletal: MMT 5/5 bilateral lower extremities in DF, PF, Inversion and Eversion. Deceased  ROM in DF of ankle joint.  Neurological: Sensation intact to light touch. Protective sensation diminished bilateral.    Assessment:     ICD-10-CM   1. Type 2 diabetes mellitus with diabetic polyneuropathy, unspecified whether long term insulin use ([facility])  E11.42     2. Pain due to onychomycosis of toenails of both feet  B35.1    M79.675    M79.674          Plan:  Patient was evaluated and treated and all questions answered.  -Mechanically debrided all nails 1-5 bilateral using sterile nail nipper and filed with dremel without incident  -discussed we could try ketoconazole for between the toes- she would like to wait, will try keeping them dry with cotton and betadine.  -Answered all patient questions -Patient to return  in 42 days/ 10 weeks for at risk foot care -Patient advised to call the office if any problems or questions arise in the meantime.

## 2022-07-08 ENCOUNTER — Other Ambulatory Visit: Payer: Self-pay | Admitting: Family Medicine

## 2022-07-09 ENCOUNTER — Other Ambulatory Visit: Payer: Self-pay | Admitting: Family Medicine

## 2022-07-09 DIAGNOSIS — R21 Rash and other nonspecific skin eruption: Secondary | ICD-10-CM

## 2022-07-09 DIAGNOSIS — B372 Candidiasis of skin and nail: Secondary | ICD-10-CM

## 2022-07-11 ENCOUNTER — Other Ambulatory Visit: Payer: Self-pay | Admitting: Family Medicine

## 2022-07-11 DIAGNOSIS — G629 Polyneuropathy, unspecified: Secondary | ICD-10-CM | POA: Diagnosis not present

## 2022-07-11 DIAGNOSIS — Z79899 Other long term (current) drug therapy: Secondary | ICD-10-CM | POA: Diagnosis not present

## 2022-07-11 DIAGNOSIS — Z5181 Encounter for therapeutic drug level monitoring: Secondary | ICD-10-CM | POA: Diagnosis not present

## 2022-07-13 ENCOUNTER — Other Ambulatory Visit: Payer: Self-pay | Admitting: Family Medicine

## 2022-07-18 ENCOUNTER — Ambulatory Visit: Payer: Self-pay | Admitting: Licensed Clinical Social Worker

## 2022-07-18 NOTE — Patient Outreach (Signed)
  Care Coordination  Initial Visit Note   07/18/2022 Name: Sanaiya Welliver MRN: [id number] DOB: 07/06/1946  Tykisha Areola is a 76 y.o. year old female who sees Metheney, Rene Kocher, MD for primary care. I spoke with  [name] by phone today.  What matters to the patients health and wellness today?  denied need for mental health support.  Would like help with managing diabetes.    Goals Addressed             This Visit's Progress    Care Coordination Activities No Follow up Required by LCSW       Care Coordination Interventions: Reviewed Care Coordination Services:interested Assessed Social Determinants of Health Made referral to RN Care Coordinator : for assistance with managing diabetes            SDOH assessments and interventions completed:  Yes  SDOH Interventions Today    Flowsheet Row Most Recent Value  SDOH Interventions   Food Insecurity Interventions Intervention Not Indicated  Stress Interventions Intervention Not Indicated        Care Coordination Interventions:  Yes, provided   Follow up plan:  No further intervention required by LCSW at this time Referral made to RN Care Coordinator   Encounter Outcome:  Pt. Visit Completed   Casimer Lanius, [place] [phone]

## 2022-07-18 NOTE — Patient Instructions (Signed)
Visit Information  Thank you for taking time to visit with me today. Please don't hesitate to contact me if I can be of assistance to you.   Following are the goals we discussed today:   Goals Addressed             This Visit's Progress    Care Coordination Activities No Follow up Required by LCSW       Care Coordination Interventions: Reviewed Care Coordination Services:interested Assessed Social Determinants of Health Made referral to RN Care Coordinator : for assistance with managing diabetes             next appointment is by telephone on 08/08/22 at 1:30 with [place] Coordinator  Please call the care guide team at [phone] if you need to cancel or reschedule your appointment.    The patient verbalized understanding of instructions, educational materials, and care plan provided today and DECLINED offer to receive copy of patient instructions, educational materials, and care plan.   Casimer Lanius, [place] [phone]

## 2022-07-25 ENCOUNTER — Other Ambulatory Visit: Payer: Self-pay | Admitting: Family Medicine

## 2022-07-28 IMAGING — DX DG SHOULDER 2+V*R*
3 series · 3 of 3 positions shown · non-contrast
Comparison: None.

CLINICAL DATA: Bilateral shoulder pain for years.

EXAM:
RIGHT SHOULDER - 2+ VIEW

[shoulder grashey]
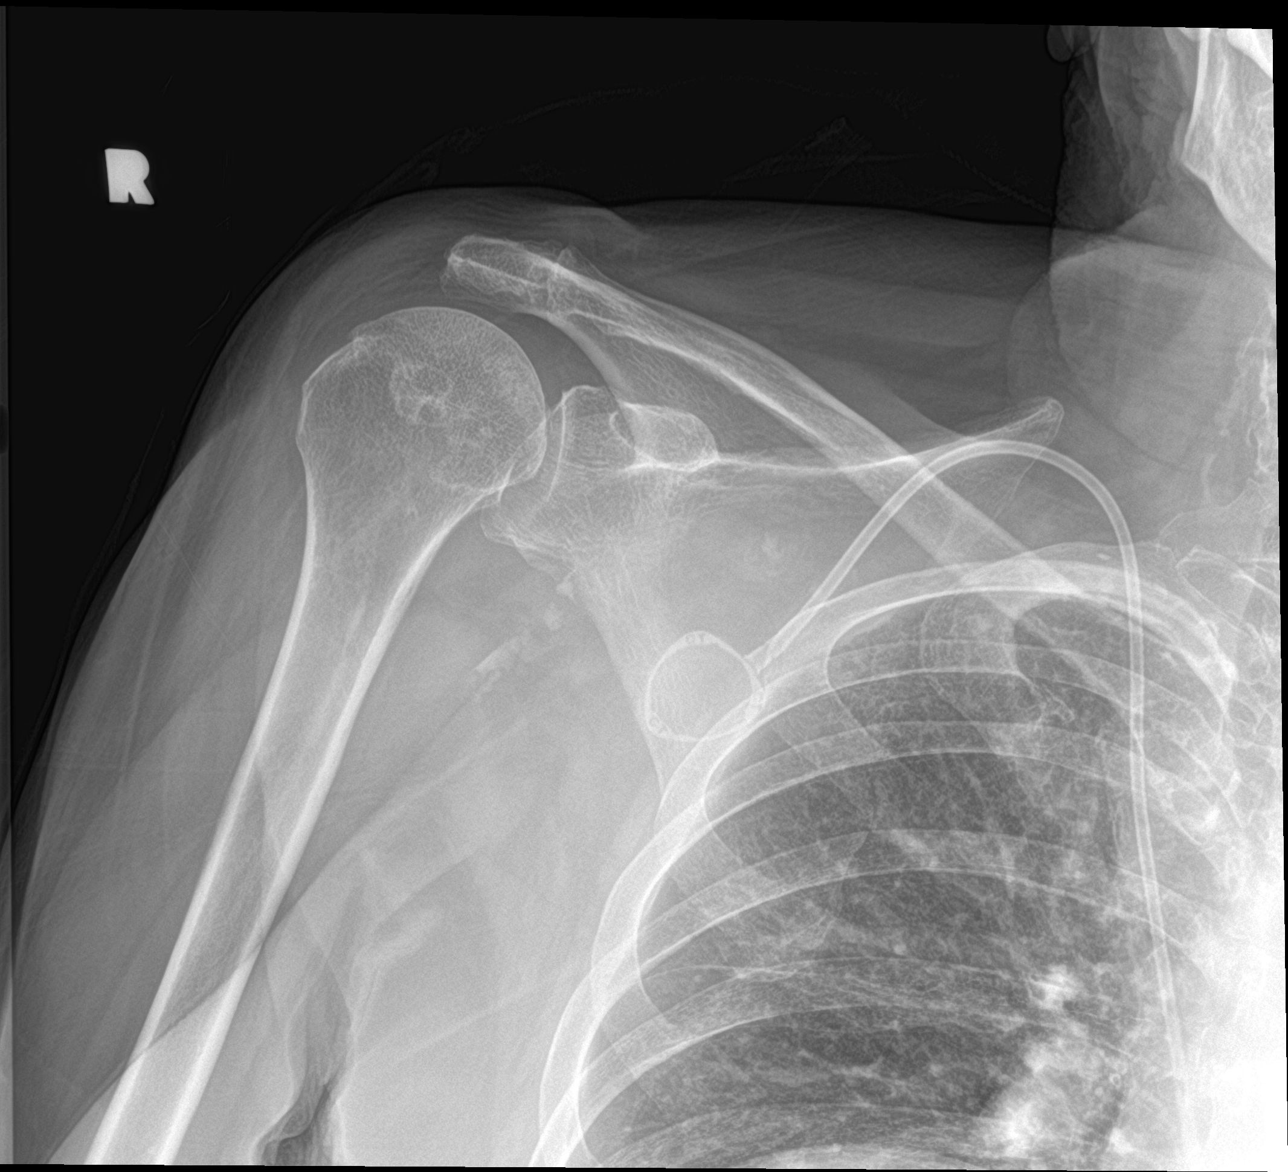

[shoulder y view]
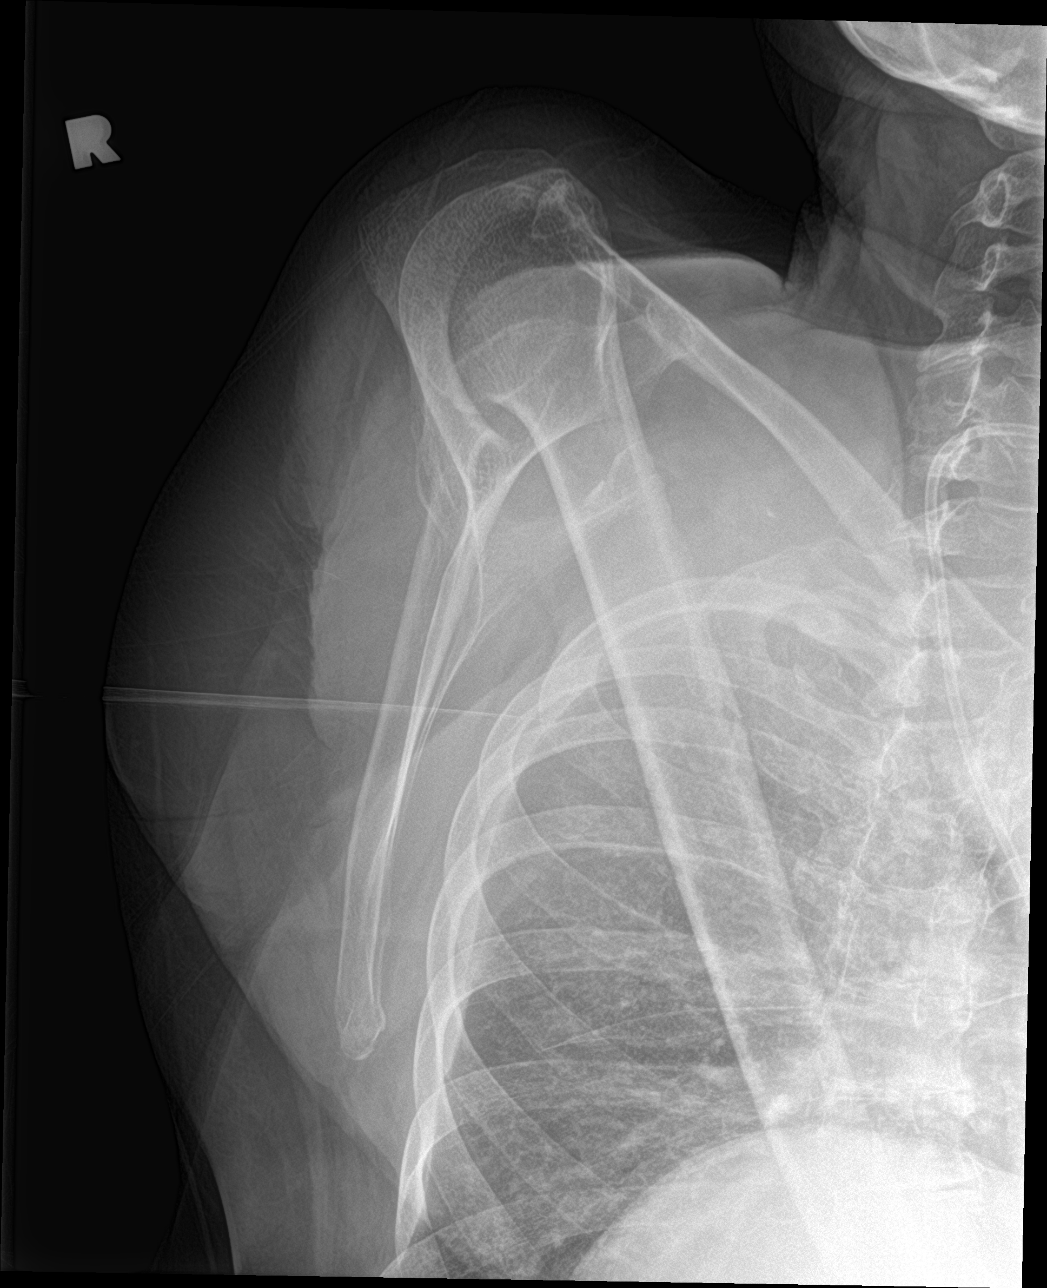

[shoulder axillary]
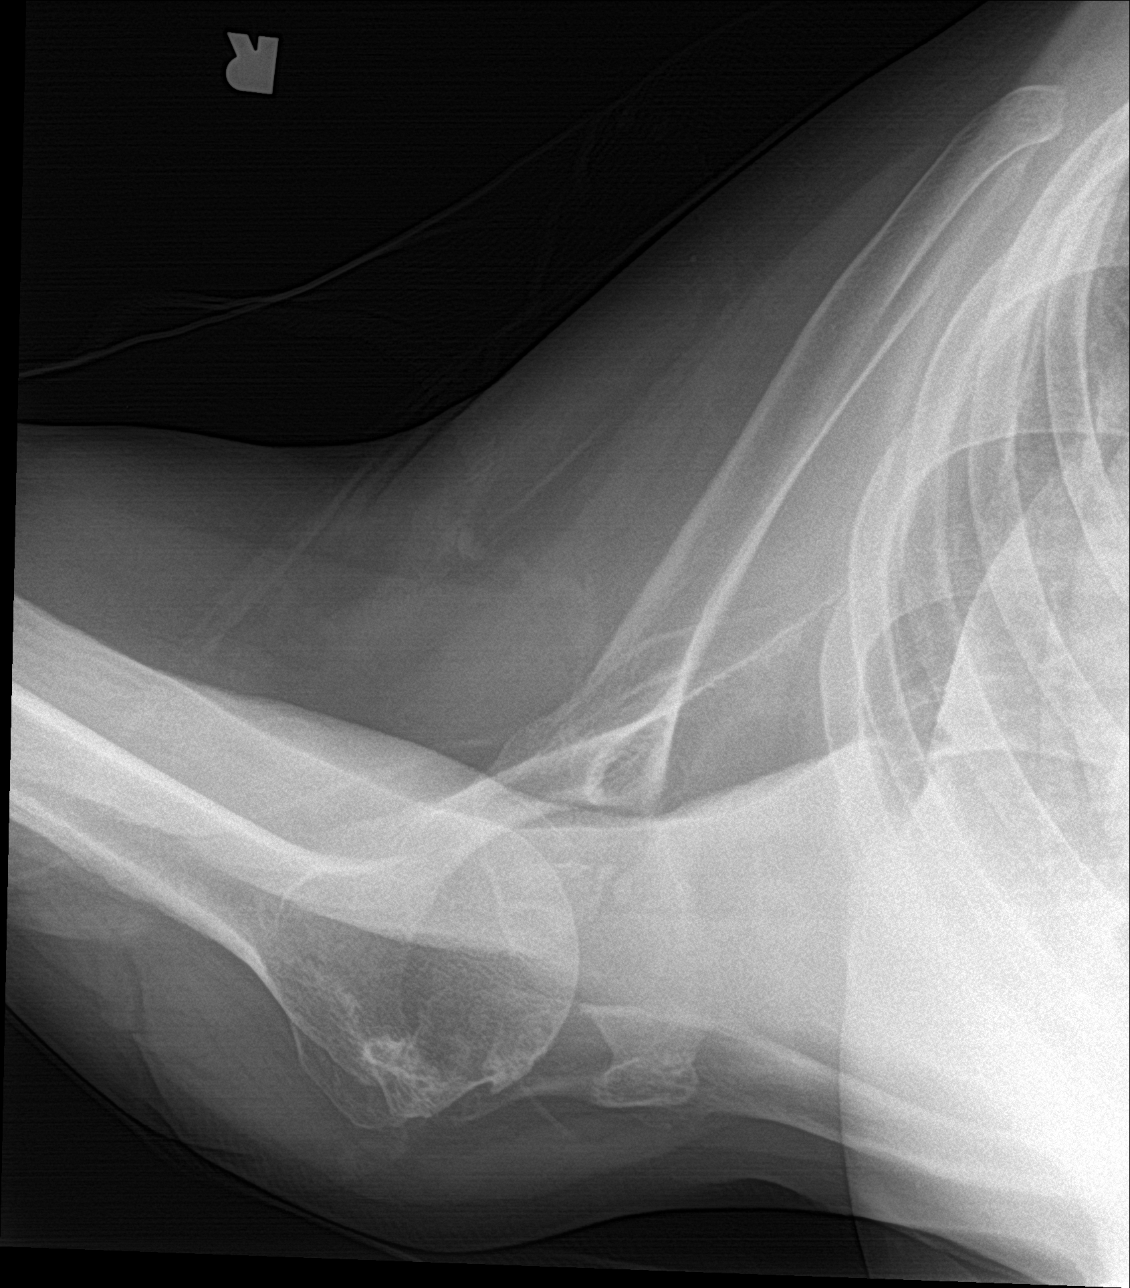

[3 of 3 positions shown; findings below may reference images not displayed]

FINDINGS: Osteopenia and similar mild degenerative changes of the AC joint and
glenohumeral joint. Slight joint space loss and bony spurring noted.
No acute osseous finding or malalignment. Negative for fracture.
Peripheral vascular calcifications noted. Right IJ power port
catheter present. Included right chest demonstrates no acute
finding.
IMPRESSION: Osteopenia and mild degenerative changes. No acute finding by plain
radiography or malalignment.

Peripheral atherosclerosis

## 2022-07-28 IMAGING — DX DG SHOULDER 2+V*L*
3 series · 3 of 3 positions shown · non-contrast
Comparison: None.

CLINICAL DATA: Bilateral shoulder pain for years.

EXAM:
LEFT SHOULDER - 2+ VIEW

[shoulder grashey]
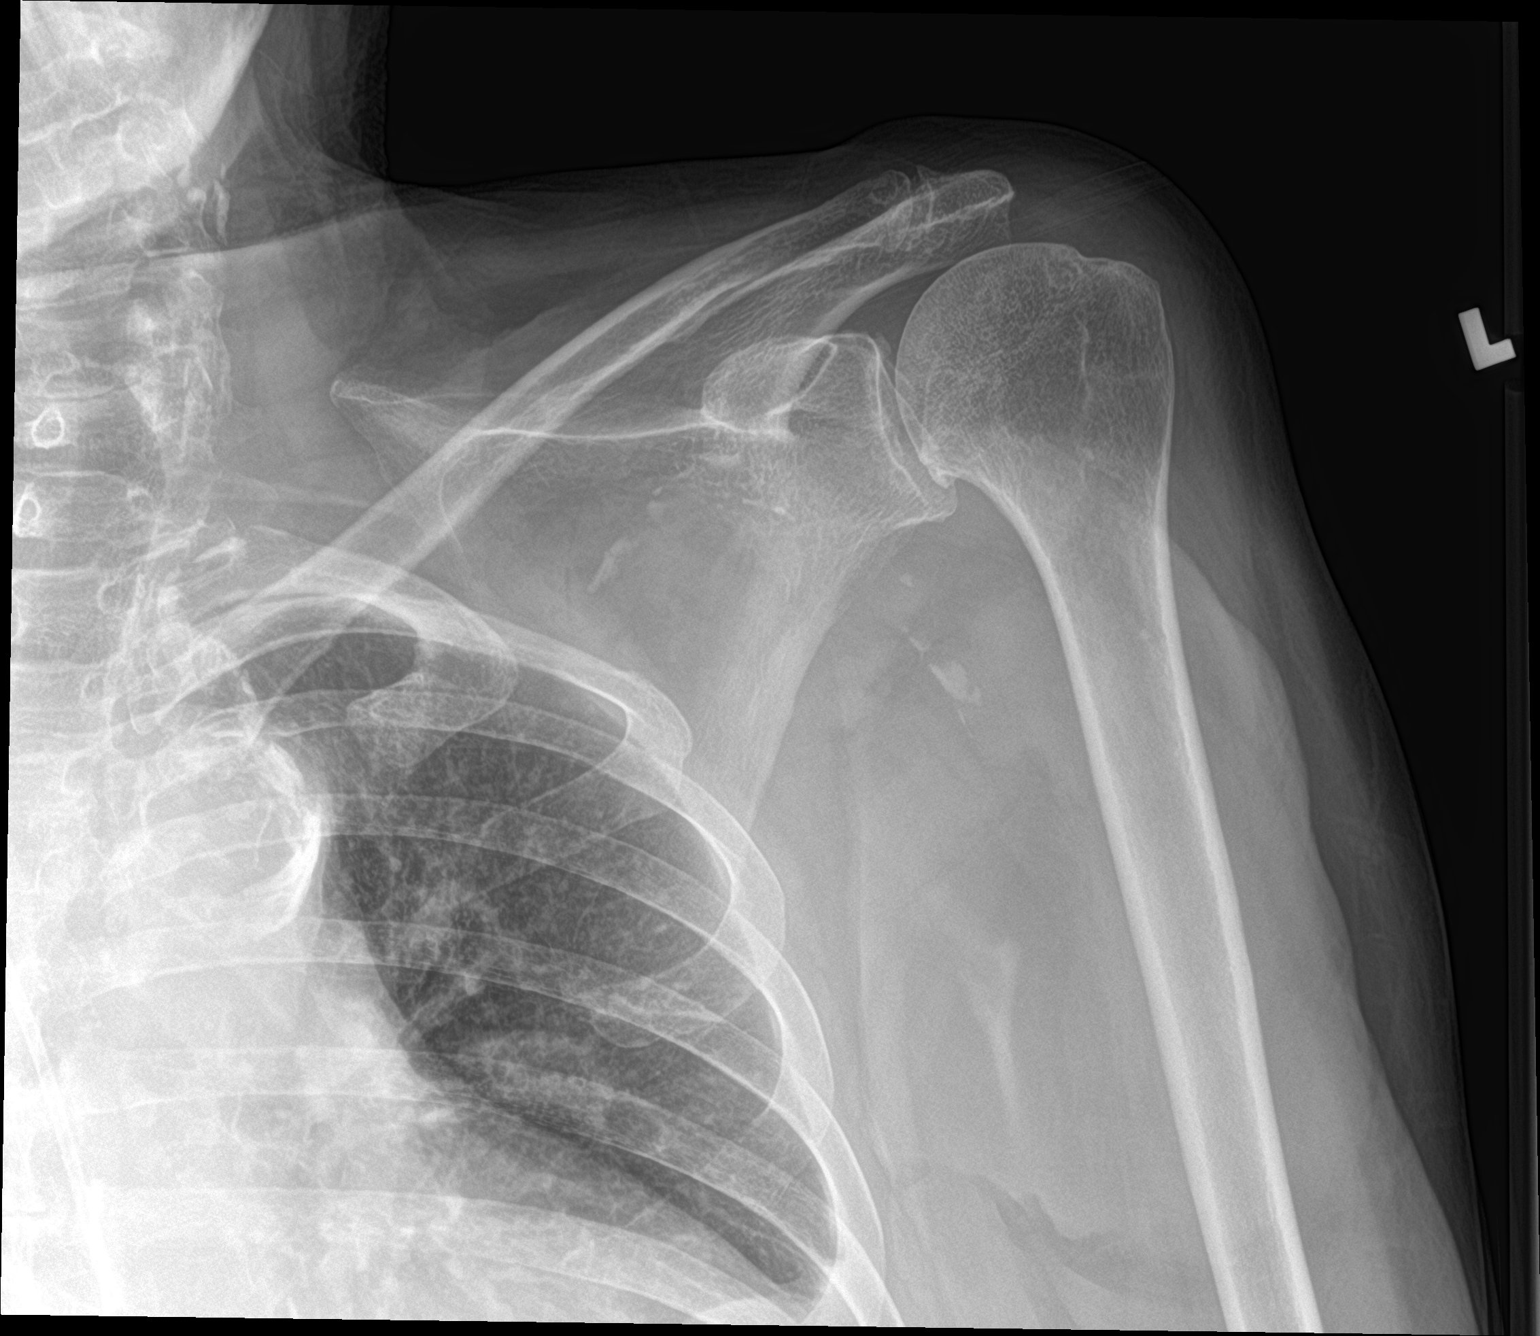

[shoulder y view]
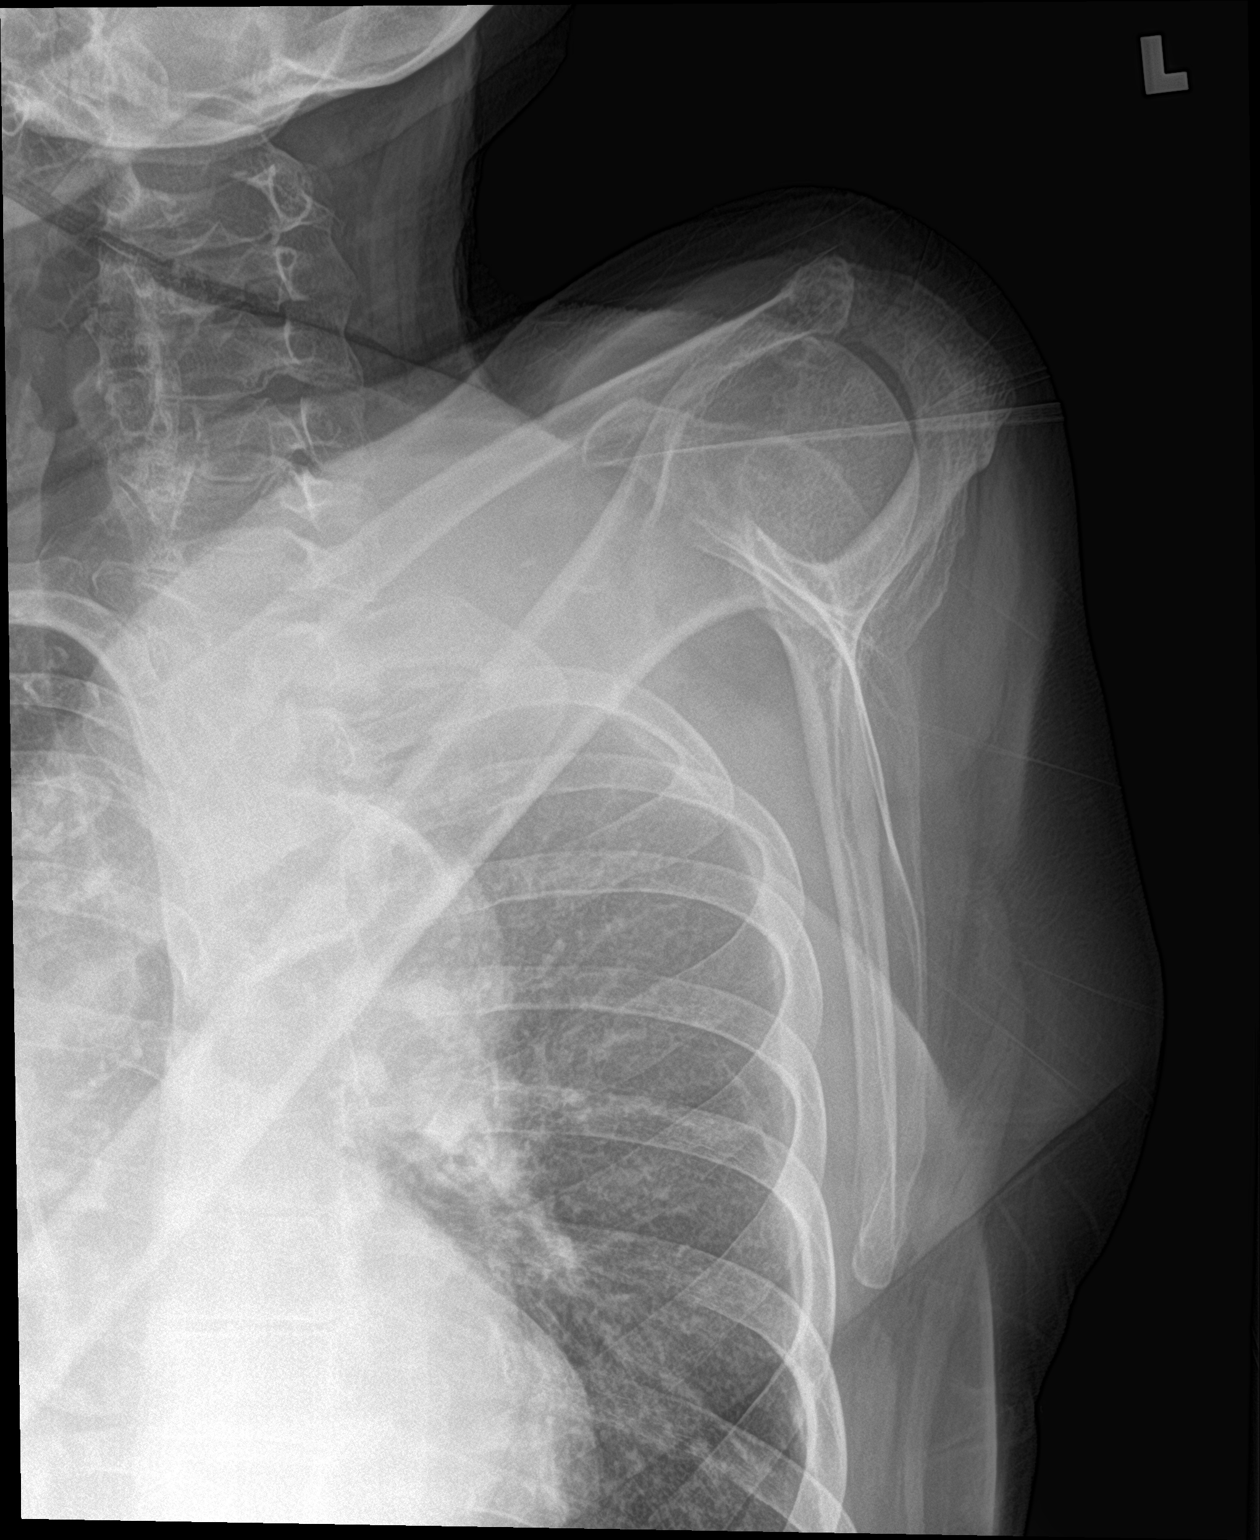

[shoulder axillary]
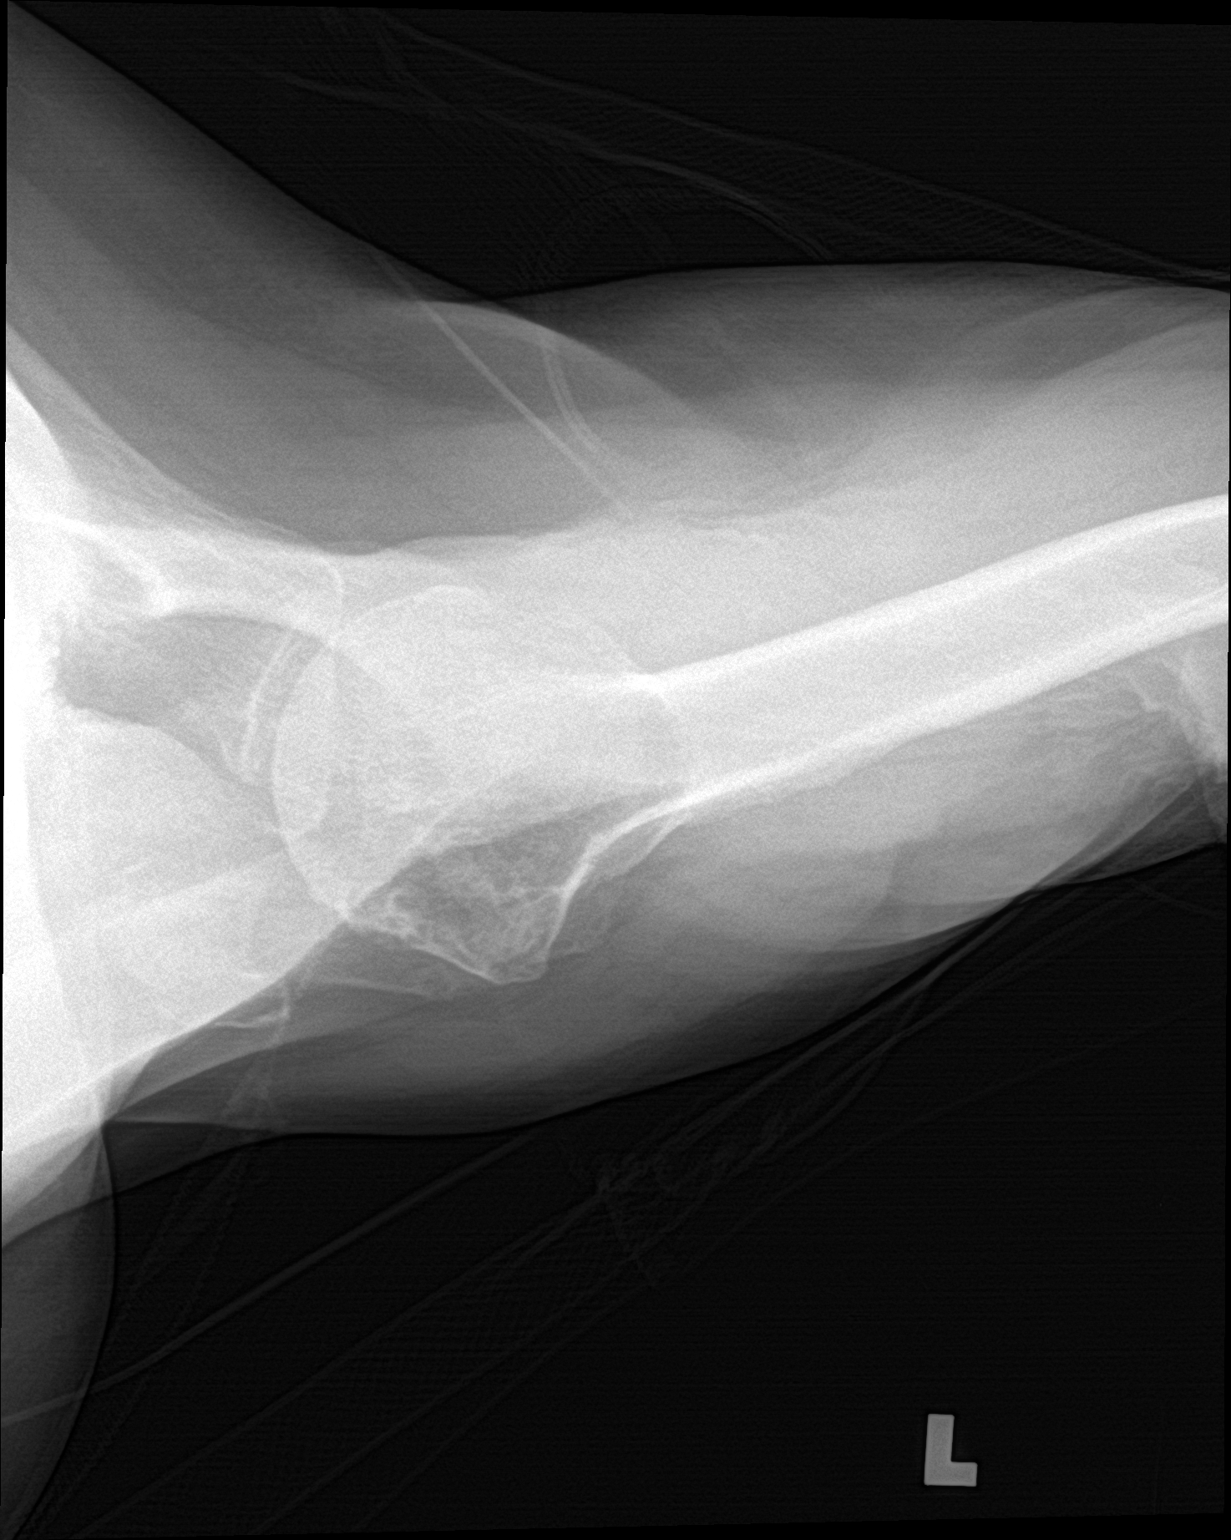

[3 of 3 positions shown; findings below may reference images not displayed]

FINDINGS: Bones are osteopenic. No acute osseous finding, fracture or
malalignment. Minor degenerative changes of the AC joint and
glenohumeral joint with bony spurring. Aortic and left upper
extremity vascular calcifications noted.
IMPRESSION: Osteopenia and mild degenerative changes. No acute finding or
malalignment.

Atherosclerosis

## 2022-08-04 DIAGNOSIS — G629 Polyneuropathy, unspecified: Secondary | ICD-10-CM | POA: Diagnosis not present

## 2022-08-08 ENCOUNTER — Other Ambulatory Visit: Payer: Self-pay | Admitting: Family Medicine

## 2022-08-08 ENCOUNTER — Ambulatory Visit: Payer: Self-pay

## 2022-08-08 DIAGNOSIS — Z139 Encounter for screening, unspecified: Secondary | ICD-10-CM

## 2022-08-08 NOTE — Patient Instructions (Signed)
Visit Information  Thank you for taking time to visit with me today. Please don't hesitate to contact me if I can be of assistance to you.   Following are the goals we discussed today:   Goals Addressed             This Visit's Progress    Care Coordination Activities Health Managment       Care Coordination Interventions: Care Guide referral for transportation options Contacted [employer] regarding CPAP tubing, no longer provider nebulizer machine and will need an order to another company. Patient to ask daughter to schedule eye doctor appointment Primary care provider updated       Our next appointment is by telephone on 08/22/22 at 11:30 am  Please call the care guide team at [phone] if you need to cancel or reschedule your appointment.   If you are experiencing a Mental Health or [place] or need someone to talk to, please call the Suicide and Crisis Lifeline: [place], RN, MSN, BSN, [place] [phone]

## 2022-08-08 NOTE — Patient Outreach (Signed)
  Care Coordination   Initial Visit Note   08/08/2022 Name: [name] MRN: [id number] DOB: 07/07/1946  [name] is a 77 y.o. year old female who sees Metheney, Rene Kocher, MD for primary care. I spoke with  [name] by phone today.  What matters to the patients health and wellness today?  [name] reports she lives by herself. Patient reports history of diabetes blood sugar this morning was 181 adding indiscretional eating during the holiday. She reports macular degeneration, but has not scheduled eye exam because her [name] has to take her to her appointments. Patient reports her [name] is supportive, but has her own health challenges. Patient states she will ask her [name] to schedule her eye exam. She reports she checks her blood sugar, but some days her hand shakes so bad that she is unable to check her blood sugar. She is interested in a CGM machine. She states her friend has a meter and she states she was able to read her friends machine.   Goals Addressed             This Visit's Progress    Care Coordination Activities Health Managment       Care Coordination Interventions: Care Guide referral for transportation options Contacted [employer] regarding CPAP tubing, no longer provider nebulizer machine and will need an order to another company. Patient to ask [name] to schedule eye doctor appointment Primary care provider updated       SDOH assessments and interventions completed:  Yes  SDOH Interventions Today    Flowsheet Row Most Recent Value  SDOH Interventions   Food Insecurity Interventions Intervention Not Indicated  Housing Interventions Intervention Not Indicated  Transportation Interventions Intervention Not Indicated  Utilities Interventions Intervention Not Indicated     Care Coordination Interventions:  Yes, provided   Follow up plan: Follow up call scheduled for 08/22/22    Encounter Outcome:  Pt. Visit Completed   Thea Silversmith, RN, MSN, BSN, [place] Coordinator [phone]

## 2022-08-09 ENCOUNTER — Telehealth: Payer: Self-pay | Admitting: *Deleted

## 2022-08-09 NOTE — Telephone Encounter (Signed)
   Telephone encounter was:  Unsuccessful.  08/09/2022 Name: [name] MRN: [id number] DOB: 1946/06/16  Unsuccessful outbound call made today to assist with:  Transportation Needs   Outreach Attempt:  1st Attempt  A HIPAA compliant voice message was left requesting a return call.  Instructed patient to call back at [phone].  [place] [phone] [address] , [place] [address] Email : [email or web address]

## 2022-08-10 ENCOUNTER — Telehealth: Payer: Self-pay | Admitting: *Deleted

## 2022-08-10 NOTE — Telephone Encounter (Signed)
   Telephone encounter was:  Successful.  08/10/2022 Name: [name] MRN: [id number] DOB: 1946-05-12  [name] is a 77 y.o. year old female who is a primary care patient of Hali Marry, MD . The community resource team was consulted for assistance with Patient advised to call daughter and leave number for [facility] transportation , I did that [phone] is the number for her to call  Care guide performed the following interventions: Patient provided with information about care guide support team and interviewed to confirm resource needs.  Follow Up Plan:  No further follow up planned at this time. The patient has been provided with needed resources. [place] [phone] [address] , [place] [address] Email : Ashby [email or web address]-moran [email or web address]

## 2022-08-13 ENCOUNTER — Other Ambulatory Visit: Payer: Self-pay | Admitting: Family Medicine

## 2022-08-13 DIAGNOSIS — E1140 Type 2 diabetes mellitus with diabetic neuropathy, unspecified: Secondary | ICD-10-CM

## 2022-08-22 ENCOUNTER — Telehealth: Payer: Self-pay

## 2022-08-22 NOTE — Patient Instructions (Signed)
Visit Information  Thank you for taking time to visit with me today. Please don't hesitate to contact me if I can be of assistance to you.   Following are the goals we discussed today:   Goals Addressed             This Visit's Progress    Care Coordination Activities Health Managment       Care Coordination Interventions: Encouraged patient to contact provider if condition worsens or does not improve Telephone follow up scheduled for next week      Our next appointment is by telephone on 08/29/22 at 1:30 pm  Please call the care guide team at [phone] if you need to cancel or reschedule your appointment.   If you are experiencing a Mental Health or [place] or need someone to talk to, please call the Suicide and Crisis Lifeline: [place], RN, MSN, BSN, [employer] [phone]

## 2022-08-22 NOTE — Patient Outreach (Signed)
  Care Coordination   Follow Up Visit Note   08/22/2022 Name: [name] MRN: [id number] DOB: 11-05-1945  [name] is a 77 y.o. year old female who sees Metheney, Rene Kocher, MD for primary care. I spoke with  [name] by phone today.  What matters to the patients health and wellness today?  [name] reports she has an upset stomach and request to reschedule telephone call today. She states every once in a while her stomach will get upset and she will feel a little nauseas. [name] denies the need to contact or see a provider at this time.   Goals Addressed             This Visit's Progress    Care Coordination Activities Health Managment       Care Coordination Interventions: Encouraged patient to contact provider if condition worsens or does not improve Telephone follow up scheduled for next week      SDOH assessments and interventions completed:  No  Care Coordination Interventions:  Yes, provided   Follow up plan: Follow up call scheduled for 08/29/22    Encounter Outcome:  Pt. Visit Completed   Thea Silversmith, RN, MSN, BSN, [place] Coordinator [phone]

## 2022-08-29 ENCOUNTER — Ambulatory Visit: Payer: Self-pay

## 2022-08-29 NOTE — Patient Instructions (Signed)
Visit Information  Thank you for taking time to visit with me today. Please don't hesitate to contact me if I can be of assistance to you.   Following are the goals we discussed today:   Goals Addressed             This Visit's Progress    Care Coordination Activities Health Managment       Care Coordination Interventions: Encouraged patient to contact provider if condition worsens or does not improve Care Coordination/Collaboration: RNCM contacted [employer]([phone]. Representative states that [employer] oxygen was transferred to [employer]. RNCM contacted [employer] to see if someone can go to home to assist patient with tubing needs. Spoke with Chloe at [facility] location, spoke with brandy at the [place] location, referred to [place] location [phone] with Jackelyn Poling) [place] back to [place] location. Per Melvenia Beam oxygen has not been serviced sine 2022. Patient will need to pay past due amount. Unable to send anyone out to address tubing issues. Per Rella Larve, she is unclear of supplier for CPAP or nebulizer. RNCM will continue to follow. Discussed CGM-patient states she would like to discuss with PCP at next visit Appointments reviewed. Encouraged to follow up with PCP as recommended and as needed Collaboration/Care coordination with PCP office regarding equipment needs. Confirmed with Ms. Scifres that she has an eye exam scheduled for 09/07/22.      Our next appointment is by telephone on 09/29/22 at 11:00am  Please call the care guide team at [phone] if you need to cancel or reschedule your appointment.   If you are experiencing a Mental Health or [place] or need someone to talk to, please call the Suicide and Crisis Lifeline: [place], RN, MSN, BSN, [place] [phone]

## 2022-08-29 NOTE — Patient Outreach (Signed)
  Care Coordination   Follow Up Visit Note   08/29/2022 Name: [name] MRN: [id number] DOB: Apr 09, 1946  [name] is a 77 y.o. year old female who sees [name], [name] Kocher, MD for primary care. I spoke with  [name] by phone today.  What matters to the patients health and wellness today?  [name] reports she is feeling better. She continues to have issues with entangled equipment tubing (oxygen, CPAP and nebulizer). She states she no longer has to use oxygen. CPAP and nebulizer tubing tangled and mixed up. She states she does not know what tubing goes to what. RNCM contacted [employer], [employer] to see if someone can go to home to assist patient with tubing needs. [name] reports primary care provider manages her lung disease and states she does not see a pulmonologist. She denies any questions or concerns at this time.  Goals Addressed             This Visit's Progress    Care Coordination Activities Health Managment       Care Coordination Interventions: Encouraged patient to contact provider if condition worsens or does not improve Care Coordination/Collaboration: RNCM contacted [phone]. Representative states that [employer] oxygen was transferred to [employer]. RNCM contacted [employer] to see if someone can go to home to assist patient with tubing needs. Spoke with [name] at [facility] location, spoke with [name] at the [place] location, referred to [place] location [phone] with [name]) referred back to [place] location. Per [name] oxygen has not been serviced sine 2022. Patient will need to pay past due amount. Unable to send anyone out to address tubing issues. Per [name], she is unclear of supplier for CPAP or nebulizer. RNCM will continue to follow. Discussed CGM-patient states she would like to discuss with PCP at next visit Appointments reviewed. Encouraged to follow up with PCP as recommended and as needed Collaboration/Care coordination  with PCP office regarding equipment needs. Confirmed with [name] that she has an eye exam scheduled for 09/07/22.      SDOH assessments and interventions completed:  No  Care Coordination Interventions:  Yes, provided   Follow up plan: Follow up call scheduled for 09/29/22    Encounter Outcome:  Pt. Visit Completed   [name] Silversmith, RN, MSN, BSN, [place]  Coordinator [phone]

## 2022-09-01 ENCOUNTER — Other Ambulatory Visit: Payer: Self-pay | Admitting: Family Medicine

## 2022-09-07 ENCOUNTER — Ambulatory Visit (INDEPENDENT_AMBULATORY_CARE_PROVIDER_SITE_OTHER): Payer: Medicare Other | Admitting: Podiatry

## 2022-09-07 ENCOUNTER — Encounter: Payer: Self-pay | Admitting: Podiatry

## 2022-09-07 DIAGNOSIS — M79675 Pain in left toe(s): Secondary | ICD-10-CM | POA: Diagnosis not present

## 2022-09-07 DIAGNOSIS — M79674 Pain in right toe(s): Secondary | ICD-10-CM | POA: Diagnosis not present

## 2022-09-07 DIAGNOSIS — E1142 Type 2 diabetes mellitus with diabetic polyneuropathy: Secondary | ICD-10-CM | POA: Diagnosis not present

## 2022-09-07 DIAGNOSIS — B351 Tinea unguium: Secondary | ICD-10-CM

## 2022-09-07 NOTE — Progress Notes (Signed)
  Subjective:  Patient ID: [name], female    DOB: 11-22-1945,   MRN: [id number]  Chief Complaint  Patient presents with   Nail Problem    Routine Foot Care    77 y.o. female presents for concern of thickened elongated and painful nails that are difficult to trim. Requesting to have them trimmed today. Denies any burning or tingling in her feet. Patient is diabetic and last A1c was 14.   . Denies any other pedal complaints. Denies n/v/f/c.   PCP: [name] MD   Past Medical History:  Diagnosis Date   Arthritis    Charcot-Marie-Tooth disease    COPD (chronic obstructive pulmonary disease) ([place])    DDD (degenerative disc disease)    Lumbar and lumbosacral   Depression    Diabetes mellitus    type 2   Diabetic peripheral neuropathy (HCC)    Facet syndrome, lumbar    Gastric ulcer    w/o hemorrhage   Hyperlipidemia    Hypertension    Insomnia    Lumbosacral root lesions, not elsewhere classified    Neurogenic bladder    Obesity    Osteopenia    Other symptoms referable to back    Post-menopausal    PUD (peptic ulcer disease)    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 CFT <3 seconds. Absent hair growth on digits. Edema noted to bilateral lower extremities. Xerosis noted bilaterally.  Skin. No lacerations or abrasions bilateral feet. Nails 1-5 bilateral  are thickened discolored and elongated with subungual debris. Maceration noted in 3rd and fourth interspaced bilateral.  Musculoskeletal: MMT 5/5 bilateral lower extremities in DF, PF, Inversion and Eversion. Deceased ROM in DF of ankle joint.  Neurological: Sensation intact to light touch. Protective  sensation diminished bilateral.    Assessment:   1. Pain due to onychomycosis of toenails of both feet   2. Type 2 diabetes mellitus with diabetic polyneuropathy, unspecified whether long term insulin use ([employer])       Plan:  Patient was evaluated and treated and all questions answered. -Discussed and educated patient on diabetic foot care, especially with  regards to the vascular, neurological and musculoskeletal systems.  -Stressed the importance of good glycemic control and the detriment of not  controlling glucose levels in relation to the foot. -Discussed supportive shoes at all times and checking feet regularly.  -Mechanically debrided all nails 1-5 bilateral using sterile nail nipper and filed with dremel without incident  -Advised to keep betadine on the macerated areas in between toes.  -Answered all patient questions -Patient to return  in 3 months for at risk foot care -Patient advised to call the office if any problems or questions arise in the meantime.    Lorenda Peck, DPM

## 2022-09-14 ENCOUNTER — Encounter: Payer: Medicare Other | Admitting: Family Medicine

## 2022-09-14 DIAGNOSIS — H26493 Other secondary cataract, bilateral: Secondary | ICD-10-CM | POA: Diagnosis not present

## 2022-09-14 DIAGNOSIS — H526 Other disorders of refraction: Secondary | ICD-10-CM | POA: Diagnosis not present

## 2022-09-14 DIAGNOSIS — E119 Type 2 diabetes mellitus without complications: Secondary | ICD-10-CM | POA: Diagnosis not present

## 2022-09-14 DIAGNOSIS — Z961 Presence of intraocular lens: Secondary | ICD-10-CM | POA: Diagnosis not present

## 2022-09-14 DIAGNOSIS — H353134 Nonexudative age-related macular degeneration, bilateral, advanced atrophic with subfoveal involvement: Secondary | ICD-10-CM | POA: Diagnosis not present

## 2022-09-14 DIAGNOSIS — H26492 Other secondary cataract, left eye: Secondary | ICD-10-CM | POA: Diagnosis not present

## 2022-09-15 DIAGNOSIS — G629 Polyneuropathy, unspecified: Secondary | ICD-10-CM | POA: Diagnosis not present

## 2022-09-19 ENCOUNTER — Other Ambulatory Visit: Payer: Self-pay | Admitting: Family Medicine

## 2022-09-22 ENCOUNTER — Ambulatory Visit (INDEPENDENT_AMBULATORY_CARE_PROVIDER_SITE_OTHER): Payer: Medicare Other | Admitting: Family Medicine

## 2022-09-22 ENCOUNTER — Encounter: Payer: Self-pay | Admitting: Family Medicine

## 2022-09-22 VITALS — BP 115/64 | HR 68 | Wt 196.0 lb

## 2022-09-22 DIAGNOSIS — K5901 Slow transit constipation: Secondary | ICD-10-CM | POA: Insufficient documentation

## 2022-09-22 DIAGNOSIS — G8929 Other chronic pain: Secondary | ICD-10-CM

## 2022-09-22 DIAGNOSIS — M25511 Pain in right shoulder: Secondary | ICD-10-CM | POA: Diagnosis not present

## 2022-09-22 DIAGNOSIS — N1832 Chronic kidney disease, stage 3b: Secondary | ICD-10-CM

## 2022-09-22 DIAGNOSIS — M25512 Pain in left shoulder: Secondary | ICD-10-CM

## 2022-09-22 DIAGNOSIS — E1140 Type 2 diabetes mellitus with diabetic neuropathy, unspecified: Secondary | ICD-10-CM | POA: Diagnosis not present

## 2022-09-22 DIAGNOSIS — K649 Unspecified hemorrhoids: Secondary | ICD-10-CM

## 2022-09-22 LAB — POCT GLYCOSYLATED HEMOGLOBIN (HGB A1C): Hemoglobin A1C: 6.8 % — AB (ref 4.0–5.6)

## 2022-09-22 MED ORDER — FUROSEMIDE 40 MG PO TABS: 40.0000 mg | ORAL_TABLET | Freq: Every day | ORAL | 1 refills | Status: DC

## 2022-09-22 MED ORDER — DULOXETINE HCL 30 MG PO CPEP: 90.0000 mg | ORAL_CAPSULE | Freq: Every day | ORAL | 1 refills | Status: DC

## 2022-09-22 NOTE — Assessment & Plan Note (Signed)
The stools and sounds are soft but she is having significant difficulty we did discuss reducing the frequency of her meclizine to see if that helps.  May also be related to some of her other medications that certainly a possibility.  She also has internal and external hemorrhoids and maybe even treating the hemorrhoids could be helpful she says she can tell a lot of times that the stool seems to have to push through that area to get through.  Will go ahead and place referral to rectal surgeon for further evaluation she might be a candidate for banding.

## 2022-09-22 NOTE — Progress Notes (Addendum)
Established Patient Office Visit  Subjective   Patient ID: [name], female    DOB: [birth year]  Age: 77 y.o. MRN: [id number]  Chief Complaint  Patient presents with   Diabetes    HPI  She was originally scheduled for physical but having several discussed concerns that she wanted to talk about.  Diabetes - no hypoglycemic events. No wounds or sores that are not healing well. No increased thirst or urination. Checking glucose at home. Taking medications as prescribed without any side effects. She would like to consider getting a CGM.    Is also concerned about the arthritis particularly in her hands she has a lot of pain in her shoulders back and hands.  She says the Tylenol is not helping.  She had previously had injections for her shoulders with Dr. Darene Lamer but it has been a while but the last time it really bumped up her blood sugars so she is hesitant to do it again.  He is also still having a lot of difficulty with her bowel movements.  She says they are not hard or pebble-like they are soft but it sometimes takes her anywhere from an hour to up to 3 hours of sitting on the toilet actually passed stool.  She does have a history of internal and external hemorrhoids.  She also needs her prescription updated for the furosemide.  Occasionally she does take 2 if she has some increased swelling.  And she has been swelling a little bit more recently she says on average she takes an extra tab maybe every 2 weeks  ROS    Objective:     BP 115/64   Pulse 68   Wt 196 lb (88.9 kg)   SpO2 97%   BMI 30.70 kg/m    Physical Exam Vitals and nursing note reviewed.  Constitutional:      Appearance: She is well-developed.  HENT:     Head: Normocephalic and atraumatic.  Cardiovascular:     Rate and Rhythm: Normal rate and regular rhythm.     Heart sounds: Normal heart sounds.  Pulmonary:     Effort: Pulmonary effort is normal.     Breath sounds: Normal breath sounds.  Skin:     General: Skin is warm and dry.  Neurological:     Mental Status: She is alert and oriented to person, place, and time.  Psychiatric:        Behavior: Behavior normal.      Results for orders placed or performed in visit on 09/22/22  POCT glycosylated hemoglobin (Hb A1C)  Result Value Ref Range   Hemoglobin A1C 6.8 (A) 4.0 - 5.6 %   HbA1c POC (<> result, manual entry)     HbA1c, POC (prediabetic range)     HbA1c, POC (controlled diabetic range)        The ASCVD Risk score (Arnett DK, et al., 2019) failed to calculate for the following reasons:   The patient has a prior MI or stroke diagnosis    Assessment & Plan:   Problem List Items Addressed This Visit       Digestive   Slow transit constipation    The stools and sounds are soft but she is having significant difficulty we did discuss reducing the frequency of her meclizine to see if that helps.  May also be related to some of her other medications that certainly a possibility.  She also has internal and external hemorrhoids and maybe even treating the hemorrhoids  could be helpful she says she can tell a lot of times that the stool seems to have to push through that area to get through.  Will go ahead and place referral to rectal surgeon for further evaluation she might be a candidate for banding.      Relevant Orders   Ambulatory referral to Colorectal Surgery     Endocrine   Diabetes mellitus with neuropathy ([place]) - Primary   Relevant Orders   BASIC METABOLIC PANEL WITH GFR   POCT glycosylated hemoglobin (Hb A1C) (Completed)     Genitourinary   Chronic kidney disease (CKD) stage G3b/A2, moderately decreased glomerular filtration rate (GFR) between 30-44 mL/min/1.73 square meter and albuminuria creatinine ratio between 30-299 mg/g (HCC)    Due to recheck renal function.      Other Visit Diagnoses     Chronic pain of both shoulders       Relevant Medications   pregabalin (LYRICA) 50 MG capsule   DULoxetine  (CYMBALTA) 30 MG capsule   Hemorrhoids, unspecified hemorrhoid type       Relevant Medications   furosemide (LASIX) 40 MG tablet   Other Relevant Orders   Ambulatory referral to Colorectal Surgery      Bilateral shoulder pain-I did encourage her to schedule follow-up appoint with Dr. Dianah Field we can always adjust her insulin if needed short-term to cover for the steroids especially if she does get significant pain relief.  It was actually really helpful for her last time.  Extremity edema-did update her prescription for the furosemide encouraged her to still keep track of how often she is needing to take an extra tab.  She does have 1+ pitting edema around both ankles today.  Return in about 3 months (around 12/21/2022) for DM.    [name], MD

## 2022-09-22 NOTE — Assessment & Plan Note (Signed)
Due to recheck renal function.

## 2022-09-22 NOTE — Patient Instructions (Signed)
Schedule wellness visit with Novella Rob this can be done over the phone

## 2022-09-23 LAB — BASIC METABOLIC PANEL WITH GFR
BUN: 19 mg/dL (ref 7–25)
CO2: 32 mmol/L (ref 20–32)
Calcium: 9.5 mg/dL (ref 8.6–10.4)
Chloride: 97 mmol/L — ABNORMAL LOW (ref 98–110)
Creat: 1 mg/dL (ref 0.60–1.00)
Glucose, Bld: 139 mg/dL — ABNORMAL HIGH (ref 65–99)
Potassium: 4.2 mmol/L (ref 3.5–5.3)
Sodium: 139 mmol/L (ref 135–146)
eGFR: 58 mL/min/{1.73_m2} — ABNORMAL LOW (ref >=60)

## 2022-09-24 NOTE — Progress Notes (Signed)
Your lab work is within acceptable range and there are no concerning findings.   ?

## 2022-09-29 ENCOUNTER — Ambulatory Visit: Payer: Self-pay

## 2022-09-29 ENCOUNTER — Telehealth: Payer: Self-pay | Admitting: *Deleted

## 2022-09-29 NOTE — Patient Outreach (Signed)
  Care Coordination   Follow Up Visit Note   09/29/2022 Name: [name] MRN: [id number] DOB: 05-27-1946  [name] is a 77 y.o. year old female who sees Madilyn Fireman, Rene Kocher, MD for primary care. I spoke with  [name] by phone today.  What matters to the patients health and wellness today?  [name] reports she is feeling good. Last office visit with PCP 09/22/22. Patient denies any difficulty breathing. O2 sat 97% at 09/22/22 office visit. She reports she is sleeping good at night (sleeps 10pm-3am then falls back to sleep and wakes up around 8 am). She reports difficulty checking blood sugar herself due to limited vision, but states daughter checks blood sugar when she comes over.  Goals Addressed             This Visit's Progress    Care Coordination Activities Health Managment       Interventions Today    Flowsheet Row Most Recent Value  Chronic Disease   Chronic disease during today's visit Diabetes  [patient reports did not check blood sugar atoday. reports daughter checks when she comes to see patient.]  General Interventions   General Interventions Discussed/Reviewed Communication with, Referral to Nurse, Durable Medical Equipment (DME)  Durable Medical Equipment (DME) --  [reports cpap, oxygen, nebulizer tubing still mixed up in the same bag and she does not know which goes to what.]  Communication with RN  [facility] NP, Monna Fam who will make home visit with client 10/03/22 around 3pm. Patient in notified and in agreement.]  Education Interventions   Education Provided Provided Education  Provided Verbal Education On Blood Sugar Monitoring, Labs  Labs Reviewed Hgb A1c     08/29/22 Care Coordination Interventions:Care Coordination/Collaboration: RNCM contacted [employer]([phone]. Representative states that [employer] oxygen was transferred to [employer]. RNCM contacted [employer] to see if someone can go to home to assist patient with tubing needs. Spoke with  Chloe at [facility] [place] location, spoke with brandy at the [place] location, referred to [place] location [phone] with Jackelyn Poling) [place] back to [place] location. Per Melvenia Beam oxygen has not been serviced sine 2022. Patient will need to pay past due amount. Unable to send anyone out to address tubing issues. Per Rella Larve, she is unclear of supplier for CPAP or nebulizer.       SDOH assessments and interventions completed:  No  Care Coordination Interventions:  Yes, provided   Follow up plan: Follow up call scheduled for 11/01/22    Encounter Outcome:  Pt. Visit Completed   Thea Silversmith, RN, MSN, BSN, [place] Coordinator [phone]

## 2022-09-29 NOTE — Patient Outreach (Signed)
Brief call to set HV time and date: Tuesday Feb 27th at 2:00.  [name]. Myrtie Neither, MSN, [facility] Gerontological Nurse Practitioner [facility] Care Management [phone]

## 2022-09-29 NOTE — Patient Instructions (Signed)
Visit Information  Thank you for taking time to visit with me today. Please don't hesitate to contact me if I can be of assistance to you.   Following are the goals we discussed today:   Goals Addressed             This Visit's Progress    Care Coordination Activities Health Managment       Interventions Today    Flowsheet Row Most Recent Value  Chronic Disease   Chronic disease during today's visit Diabetes  [patient reports did not check blood sugar atoday. reports daughter checks when she comes to see patient.]  General Interventions   General Interventions Discussed/Reviewed Communication with, Referral to Nurse, Durable Medical Equipment (DME)  Durable Medical Equipment (DME) --  [reports cpap, oxygen, nebulizer tubing still mixed up in the same bag and she does not know which goes to what.]  Communication with RN  [facility] NP, Monna Fam who will make home visit with client 10/03/22 around 3pm. Patient in notified and in agreement.]  Education Interventions   Education Provided Provided Education  Provided Verbal Education On Blood Sugar Monitoring, Labs  Labs Reviewed Hgb A1c     08/29/22 Care Coordination Interventions:Care Coordination/Collaboration: RNCM contacted [facility]([phone]. Representative states that [employer] oxygen was transferred to [employer]. RNCM contacted [employer] to see if someone can go to home to assist patient with tubing needs. Spoke with Chloe at [facility] location, spoke with brandy at the [place] location, referred to [place] location [phone] with Jackelyn Poling) referred back to [place] location. Per Melvenia Beam oxygen has not been serviced sine 2022. Patient will need to pay past due amount. Unable to send anyone out to address tubing issues. Per Rella Larve, she is unclear of supplier for CPAP or nebulizer.       Our next appointment is by telephone on 11/01/22 at 11 am  Please call the care guide team at [phone] if you need to cancel or  reschedule your appointment.   If you are experiencing a Mental Health or [facility] or need someone to talk to, please call the Suicide and Crisis Lifeline: [place], RN, MSN, BSN, [place] [phone]

## 2022-10-01 ENCOUNTER — Other Ambulatory Visit: Payer: Self-pay | Admitting: Family Medicine

## 2022-10-01 DIAGNOSIS — R42 Dizziness and giddiness: Secondary | ICD-10-CM

## 2022-10-02 ENCOUNTER — Ambulatory Visit: Payer: Medicare Other | Admitting: Sports Medicine

## 2022-10-03 ENCOUNTER — Ambulatory Visit: Payer: Self-pay | Admitting: *Deleted

## 2022-10-04 ENCOUNTER — Encounter: Payer: Self-pay | Admitting: *Deleted

## 2022-10-04 NOTE — Patient Outreach (Signed)
Home visit requested per [name] to ensure pt respiratory equipment is in working order.  Met with [name] on 10/03/22 3:00 pm.  Pt brings me her CPAP and nebulizer with other supplies in a big wire basket. These supplies are covered in dust and it is evident they haven't been used in quite a while.  [name] says she has not used the CPAP or nebulizer in a "long time" She does not feel like she needs them since she has been on Trelegy. She also does not use her O2.  NP went through all the supplies they were very old and the CPAP machine appeared to be extremely old. She couldn't remember the last time she used it.   The nebulizer is still good but she would need new tubings and a new Rx. Her Rx for nebs expired in 2020.  NP removed all equipment except the nebulizer she may need to use in the future.  Objective: jPulse 90, Resp 18, BP 110/70 Pulse Ox 94% Pt is alert oriented and appropriate.  Vision - very poor probably due to her DM. Heart: RRR Lungs dimished but clear  A: COPD GOLD, current smoker. Sx managed with current inhaler regimen.  DM not at goal  P: Will report back to assigned care manager.  Pt need audio assist glucose monitor if possible.  Eulah Pont. Myrtie Neither, MSN, [facility] Gerontological Nurse Practitioner [facility] Care Management [phone]

## 2022-10-06 ENCOUNTER — Other Ambulatory Visit: Payer: Self-pay | Admitting: Family Medicine

## 2022-10-06 DIAGNOSIS — E1140 Type 2 diabetes mellitus with diabetic neuropathy, unspecified: Secondary | ICD-10-CM

## 2022-10-06 MED ORDER — DEXCOM G6 RECEIVER DEVI: 99 refills | Status: DC

## 2022-10-06 MED ORDER — DEXCOM G6 SENSOR MISC: 99 refills | Status: DC

## 2022-10-06 MED ORDER — DEXCOM G6 TRANSMITTER MISC: 99 refills | Status: DC

## 2022-10-06 NOTE — Progress Notes (Signed)
Needs G6 with audio   Meds ordered this encounter  Medications   Continuous Blood Gluc Sensor (DEXCOM G6 SENSOR) MISC    Sig: Use for 10 days and then change sensors.    Dispense:  9 each    Refill:  PRN   Continuous Blood Gluc Transmit (DEXCOM G6 TRANSMITTER) MISC    Sig: Use as directed.    Dispense:  1 each    Refill:  PRN   Continuous Blood Gluc Receiver (DEXCOM G6 RECEIVER) DEVI    Sig: Use as directed    Dispense:  1 each    Refill:  PRN

## 2022-10-08 ENCOUNTER — Other Ambulatory Visit: Payer: Self-pay | Admitting: Family Medicine

## 2022-10-10 ENCOUNTER — Ambulatory Visit (INDEPENDENT_AMBULATORY_CARE_PROVIDER_SITE_OTHER): Payer: Medicare Other | Admitting: Sports Medicine

## 2022-10-10 ENCOUNTER — Ambulatory Visit (INDEPENDENT_AMBULATORY_CARE_PROVIDER_SITE_OTHER): Payer: Medicare Other

## 2022-10-10 ENCOUNTER — Telehealth: Payer: Self-pay | Admitting: Family Medicine

## 2022-10-10 DIAGNOSIS — M19011 Primary osteoarthritis, right shoulder: Secondary | ICD-10-CM

## 2022-10-10 DIAGNOSIS — M19012 Primary osteoarthritis, left shoulder: Secondary | ICD-10-CM | POA: Diagnosis not present

## 2022-10-10 MED ORDER — TRIAMCINOLONE ACETONIDE 40 MG/ML IJ SUSP
80.0000 mg | Freq: Once | INTRAMUSCULAR | Status: AC
  Administered 2022-10-10: 80 mg via INTRAMUSCULAR

## 2022-10-10 NOTE — Telephone Encounter (Signed)
Pt wanted Dr.Metheney to know that she got 2 injections in her shoulders today with Dr.T, and Dr.Metheney may need to adjust her insulin.

## 2022-10-10 NOTE — Assessment & Plan Note (Signed)
Bilateral glenohumeral joint osteoarthritis, last injected June 2023, recurrence of pain, repeat bilateral injections today, return to see me as needed.

## 2022-10-10 NOTE — Telephone Encounter (Signed)
Go up by 5 units daily. If fasting sugar > 160 then let us know.

## 2022-10-10 NOTE — Progress Notes (Signed)
    Procedures performed today:    Procedure: Real-time Ultrasound Guided injection of the left glenohumeral joint Device: Samsung HS60  Verbal informed consent obtained.  Time-out conducted.  Noted no overlying erythema, induration, or other signs of local infection.  Skin prepped in a sterile fashion.  Local anesthesia: Topical Ethyl chloride.  With sterile technique and under real time ultrasound guidance: Noted arthritic joint, 1 cc Kenalog 40, 2 cc lidocaine, 2 cc bupivacaine injected easily Completed without difficulty  Advised to call if fevers/chills, erythema, induration, drainage, or persistent bleeding.  Images permanently stored and available for review in PACS.  Impression: Technically successful ultrasound guided injection.   Procedure: Real-time Ultrasound Guided injection of the right glenohumeral joint Device: Samsung HS60  Verbal informed consent obtained.  Time-out conducted.  Noted no overlying erythema, induration, or other signs of local infection.  Skin prepped in a sterile fashion.  Local anesthesia: Topical Ethyl chloride.  With sterile technique and under real time ultrasound guidance: Noted arthritic joint, 1 cc Kenalog 40, 2 cc lidocaine, 2 cc bupivacaine injected easily Completed without difficulty  Advised to call if fevers/chills, erythema, induration, drainage, or persistent bleeding.  Images permanently stored and available for review in PACS.  Impression: Technically successful ultrasound guided injection.  Independent interpretation of notes and tests performed by another provider:   None.  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    ____________________________________________ Gwen Her. Dianah Field, M.D., ABFM., CAQSM.,  AME. Primary Care and Sports Medicine [facility]  Adjunct Professor of [employer]  Risk manager

## 2022-10-10 NOTE — Addendum Note (Signed)
Addended by: Tarri Glenn A on: 10/10/2022 02:41 PM   Modules accepted: Orders

## 2022-10-10 NOTE — Telephone Encounter (Signed)
Task completed. Patient has been informed of provider's recommendation. Patient is aware to contact the clinic if fasting sugars are >160. Patient verbalized understanding. No other inquiries were asked by the patient during the call.

## 2022-10-12 DIAGNOSIS — M5416 Radiculopathy, lumbar region: Secondary | ICD-10-CM | POA: Diagnosis not present

## 2022-10-12 DIAGNOSIS — G894 Chronic pain syndrome: Secondary | ICD-10-CM | POA: Diagnosis not present

## 2022-10-12 DIAGNOSIS — G6181 Chronic inflammatory demyelinating polyneuritis: Secondary | ICD-10-CM | POA: Diagnosis not present

## 2022-10-12 NOTE — Telephone Encounter (Signed)
OK to go up 5 more units.  Not sure how many units using total.  Stay at that dose over the weekend.

## 2022-10-12 NOTE — Telephone Encounter (Signed)
Pt called and stated that her Blood sugar this morning was 283, she did go up 5 Units on the Lantus  Pt understands that if her fasting BS is >160 she is to call and let us know however, she wanted to know what she should do over the weekend should this happen?   Pt stated that she has an appointment this morning and will not be back home until later this afternoon and asked that we call her back at that time.

## 2022-10-13 NOTE — Telephone Encounter (Signed)
Okay to give 65 units if blood sugar greater than 160.  If it starts going back below 168 then go back down to 60 units

## 2022-10-16 NOTE — Telephone Encounter (Signed)
Patient  states last night  BP went up to 270 and she gave herself 70 units of insulin last night - this a.m. BS reading was 240.  States her eating habits have not changed. When asked how she was feeling - she states not feeling great because she is late getting her infusion and can not get this until Thursday 10/19/2022. I asked patient to check her testing strips to be sure that they have not expired. She will do this when she is next testing her BS and give [place] a call if they had expired.

## 2022-10-16 NOTE — Telephone Encounter (Signed)
Is also tell her to really cut back on her carbs and starches and sweets.  Eat mostly vegetables and protein and at least until sugars are coming down and hydrate well.

## 2022-10-17 ENCOUNTER — Telehealth: Payer: Self-pay | Admitting: *Deleted

## 2022-10-17 NOTE — Telephone Encounter (Signed)
See secondary note

## 2022-10-17 NOTE — Telephone Encounter (Signed)
Pt called and LVM stating that her BS was down to  163 and she is taking 68 units of the Lantus.   Will fwd to pcp for next steps

## 2022-10-17 NOTE — Telephone Encounter (Signed)
Pt advised. She voiced understanding and agreed.  

## 2022-10-17 NOTE — Telephone Encounter (Signed)
OK. Pls stay at 68 units until fasting glucose under 130.

## 2022-10-18 ENCOUNTER — Other Ambulatory Visit: Payer: Self-pay | Admitting: *Deleted

## 2022-10-18 DIAGNOSIS — B370 Candidal stomatitis: Secondary | ICD-10-CM

## 2022-10-18 MED ORDER — NYSTATIN 100000 UNIT/ML MT SUSP: 5.0000 mL | Freq: Four times a day (QID) | OROMUCOSAL | 1 refills | Status: DC

## 2022-10-25 ENCOUNTER — Other Ambulatory Visit: Payer: Self-pay | Admitting: Family Medicine

## 2022-10-25 DIAGNOSIS — J449 Chronic obstructive pulmonary disease, unspecified: Secondary | ICD-10-CM

## 2022-10-28 ENCOUNTER — Other Ambulatory Visit: Payer: Self-pay | Admitting: Family Medicine

## 2022-10-30 ENCOUNTER — Ambulatory Visit (INDEPENDENT_AMBULATORY_CARE_PROVIDER_SITE_OTHER): Payer: Medicare Other | Admitting: Family Medicine

## 2022-10-30 DIAGNOSIS — Z122 Encounter for screening for malignant neoplasm of respiratory organs: Secondary | ICD-10-CM | POA: Diagnosis not present

## 2022-10-30 DIAGNOSIS — F17200 Nicotine dependence, unspecified, uncomplicated: Secondary | ICD-10-CM

## 2022-10-30 DIAGNOSIS — Z0000 Encounter for general adult medical examination without abnormal findings: Secondary | ICD-10-CM | POA: Diagnosis not present

## 2022-10-30 NOTE — Patient Instructions (Addendum)
[place] Maintenance Summary and Written Plan of Care  [name] ,  Thank you for allowing me to perform your Medicare Annual Wellness Visit and for your ongoing commitment to your health.   Health Maintenance & Immunization History Health Maintenance  Topic Date Due  . DTaP/Tdap/Td (2 - Td or Tdap) 10/05/2021  . OPHTHALMOLOGY EXAM  10/30/2022 (Originally 03/10/2021)  . Diabetic kidney evaluation - Urine ACR  10/31/2022 (Originally 12/05/2017)  . FOOT EXAM  10/31/2022 (Originally 05/01/2020)  . COVID-19 Vaccine (2 - Janssen risk series) 10/08/2023 (Originally 11/28/2019)  . Lung Cancer Screening  10/30/2023 (Originally 09/12/2013)  . Zoster Vaccines- Shingrix (1 of 2) 12/21/2023 (Originally 08/22/1964)  . HEMOGLOBIN A1C  03/23/2023  . Diabetic kidney evaluation - eGFR measurement  09/23/2023  . Medicare Annual Wellness (AWV)  10/30/2023  . Pneumonia Vaccine 31+ Years old  Completed  . INFLUENZA VACCINE  Completed  . DEXA SCAN  Completed  . Hepatitis C Screening  Completed  . HPV VACCINES  Aged Out  . COLONOSCOPY (Pts 45-27yrs Insurance coverage will need to be confirmed)  Discontinued   Immunization History  Administered Date(s) Administered  . Fluad Quad(high Dose 65+) 05/02/2019, 06/13/2021, 05/15/2022  . H1N1 06/19/2008  . Influenza Split 05/06/2012  . Influenza Whole 05/28/2006, 06/13/2007, 05/28/2008, 07/05/2009, 05/06/2010, 04/24/2011  . Influenza, High Dose Seasonal PF 05/09/2018, 05/02/2019  . Influenza,inj,Quad PF,6+ Mos 05/07/2013, 03/27/2014, 05/03/2015, 04/03/2016  . Influenza-Unspecified 05/06/2012, 05/07/2013, 03/27/2014, 05/03/2015, 04/03/2016, 05/06/2020  . Janssen (J&J) SARS-COV-2 Vaccination 10/31/2019  . Pneumococcal Conjugate-13 04/27/2014  . Pneumococcal Polysaccharide-23 06/07/2001, 08/15/2006, 01/09/2012  . Tdap 10/06/2011    These are the patient goals that we discussed:  Goals Addressed              This Visit's Progress    .  Patient Stated (pt-stated)        Patient would like to be more active when the weather is nicer.        This is a list of Health Maintenance Items that are overdue or due now: Health Maintenance Due  Topic Date Due  . DTaP/Tdap/Td (2 - Td or Tdap) 10/05/2021   Urine ACR Foot exam Td vaccine Shingles vaccine Eye exam Lung cancer screening  Patient declined the shingles vaccine.  Orders/Referrals Placed Today: No orders of the defined types were placed in this encounter.  (Contact our referral department at [phone] if you have not spoken with someone about your referral appointment within the next 5 days)    Follow-up Plan Follow-up with Hali Marry, MD as planned Schedule td vaccine at the pharmacy.  Foot exam and Urine ACR can be done at the next office visit. She will have the eye exam records faxed over. Medicare wellness visit in one year.  AVS printed and mailed to the patient.      Health Maintenance, Female Adopting a healthy lifestyle and getting preventive care are important in promoting health and wellness. Ask your health care provider about: The right schedule for you to have regular tests and exams. Things you can do on your own to prevent diseases and keep yourself healthy. What should I know about diet, weight, and exercise? Eat a healthy diet  Eat a diet that includes plenty of vegetables, fruits, low-fat dairy products, and lean protein. Do not eat a lot of foods that are high in solid fats, added sugars, or sodium. Maintain a healthy weight Body mass index (BMI) is used to identify weight  problems. It estimates body fat based on height and weight. Your health care provider can help determine your BMI and help you achieve or maintain a healthy weight. Get regular exercise Get regular exercise. This is one of the most important things you can do for your health. Most adults should: Exercise for at least 150 minutes each week. The  exercise should increase your heart rate and make you sweat (moderate-intensity exercise). Do strengthening exercises at least twice a week. This is in addition to the moderate-intensity exercise. Spend less time sitting. Even light physical activity can be beneficial. Watch cholesterol and blood lipids Have your blood tested for lipids and cholesterol at 78 years of age, then have this test every 5 years. Have your cholesterol levels checked more often if: Your lipid or cholesterol levels are high. You are older than 78 years of age. You are at high risk for heart disease. What should I know about cancer screening? Depending on your health history and family history, you may need to have cancer screening at various ages. This may include screening for: Breast cancer. Cervical cancer. Colorectal cancer. Skin cancer. Lung cancer. What should I know about heart disease, diabetes, and high blood pressure? Blood pressure and heart disease High blood pressure causes heart disease and increases the risk of stroke. This is more likely to develop in people who have high blood pressure readings or are overweight. Have your blood pressure checked: Every 3-5 years if you are 78-10 years of age. Every year if you are 13 years old or older. Diabetes Have regular diabetes screenings. This checks your fasting blood sugar level. Have the screening done: Once every three years after age 57 if you are at a normal weight and have a low risk for diabetes. More often and at a younger age if you are overweight or have a high risk for diabetes. What should I know about preventing infection? Hepatitis B If you have a higher risk for hepatitis B, you should be screened for this virus. Talk with your health care provider to find out if you are at risk for hepatitis B infection. Hepatitis C Testing is recommended for: Everyone born from [birth year] through 1966. Anyone with known risk factors for hepatitis  C. Sexually transmitted infections (STIs) Get screened for STIs, including gonorrhea and chlamydia, if: You are sexually active and are younger than 78 years of age. You are older than 78 years of age and your health care provider tells you that you are at risk for this type of infection. Your sexual activity has changed since you were last screened, and you are at increased risk for chlamydia or gonorrhea. Ask your health care provider if you are at risk. Ask your health care provider about whether you are at high risk for HIV. Your health care provider may recommend a prescription medicine to help prevent HIV infection. If you choose to take medicine to prevent HIV, you should first get tested for HIV. You should then be tested every 3 months for as long as you are taking the medicine. Pregnancy If you are about to stop having your period (premenopausal) and you may become pregnant, seek counseling before you get pregnant. Take 400 to 800 micrograms (mcg) of folic acid every day if you become pregnant. Ask for birth control (contraception) if you want to prevent pregnancy. Osteoporosis and menopause Osteoporosis is a disease in which the bones lose minerals and strength with aging. This can result in bone fractures. If you  are 42 years old or older, or if you are at risk for osteoporosis and fractures, ask your health care provider if you should: Be screened for bone loss. Take a calcium or vitamin D supplement to lower your risk of fractures. Be given hormone replacement therapy (HRT) to treat symptoms of menopause. Follow these instructions at home: Alcohol use Do not drink alcohol if: Your health care provider tells you not to drink. You are pregnant, may be pregnant, or are planning to become pregnant. If you drink alcohol: Limit how much you have to: 0-1 drink a day. Know how much alcohol is in your drink. In the [place] one drink equals one 12 oz bottle of beer (355 mL), one 5 oz glass  of wine (148 mL), or one 1 oz glass of hard liquor (44 mL). Lifestyle Do not use any products that contain nicotine or tobacco. These products include cigarettes, chewing tobacco, and vaping devices, such as e-cigarettes. If you need help quitting, ask your health care provider. Do not use street drugs. Do not share needles. Ask your health care provider for help if you need support or information about quitting drugs. General instructions Schedule regular health, dental, and eye exams. Stay current with your vaccines. Tell your health care provider if: You often feel depressed. You have ever been abused or do not feel safe at home. Summary Adopting a healthy lifestyle and getting preventive care are important in promoting health and wellness. Follow your health care provider's instructions about healthy diet, exercising, and getting tested or screened for diseases. Follow your health care provider's instructions on monitoring your cholesterol and blood pressure. This information is not intended to replace advice given to you by your health care provider. Make sure you discuss any questions you have with your health care provider. Document Revised: 12/13/2020 Document Reviewed: 12/13/2020 Elsevier Patient Education  [employer].

## 2022-10-30 NOTE — Progress Notes (Signed)
MEDICARE ANNUAL WELLNESS VISIT  10/30/2022  Telephone Visit Disclaimer This Medicare AWV was conducted by telephone due to national recommendations for restrictions regarding the COVID-19 Pandemic (e.g. social distancing).  I verified, using two identifiers, that I am speaking with [name] or their authorized healthcare agent. I discussed the limitations, risks, security, and privacy concerns of performing an evaluation and management service by telephone and the potential availability of an in-person appointment in the future. The patient expressed understanding and agreed to proceed.  Location of Patient: Home Location of Provider (nurse):  In the office.  Subjective:    [name] is a 77 y.o. female patient of Metheney, Rene Kocher, MD who had a Medicare Annual Wellness Visit today via telephone. [name] is Retired and lives alone. she has 2 children. she reports that she is socially active and does interact with friends/family regularly. she is minimally physically active and enjoys listening to her neighbor reading books to her.  Patient Care Team: Hali Marry, MD as PCP - General (Family Medicine) Christin Fudge., MD as Referring Physician (Psychiatry) Kriste Basque, MD as Referring Physician (Pain Medicine)     10/30/2022    1:06 PM 10/24/2021    1:16 PM 10/22/2020    3:15 PM 09/17/2015    9:45 AM 09/11/2014   10:43 AM 01/14/2014   11:13 AM  Advanced Directives  Does Patient Have a Medical Advance Directive? Yes Yes Yes Yes Yes Patient has advance directive, copy not in chart  Type of Advance Directive Living will Living will;Healthcare Power of [place];Living will [place];Living will [place];Living will [place];Living will  Does patient want to make changes to medical advance directive? No - Patient declined No - Patient declined No - Patient declined No - Patient  declined    Copy of [place] in Chart?  No - copy requested No - copy requested No - copy requested No - copy requested   Pre-existing out of facility DNR order (yellow form or pink MOST form)      Yellow form placed in chart (order not valid for inpatient use)    Hospital Utilization Over the Past 12 Months: # of hospitalizations or ER visits: 0 # of surgeries: 0  Review of Systems    Patient reports that her overall health is unchanged compared to last year.  History obtained from chart review and the patient  Patient Reported Readings (BP, Pulse, CBG, Weight, etc) none  Pain Assessment Pain : No/denies pain     Current Medications & Allergies (verified) Allergies as of 10/30/2022       Reactions   Varenicline Nausea And Vomiting        Medication List        Accurate as of October 30, 2022  1:21 PM. If you have any questions, ask your nurse or doctor.          alendronate 70 MG tablet Commonly known as: FOSAMAX TAKE 1 TABLET BY MOUTH EVERY 7 DAYS. TAKE IN THE MORNING WITH A FULL GLASS OF WATER ON AN EMPTY STOMACH. DO NOT TAKE OTHER FOOD OR DRINK OR LIE DOWN   AMBULATORY NON FORMULARY MEDICATION Medication Name: Bilateral knee braces for weakness,foot drop.Medication Name: Bilateral knee braces for weakness,foot drop. Restore Phone: [phone] Fax : [phone]   aspirin EC 81 MG tablet Take 81 mg by mouth daily.   blood glucose meter kit  and supplies Kit Dispense based on patient and insurance preference. Use up to four times daily as directed Dx E11.40   calcium carbonate 600 MG tablet Commonly known as: OS-CAL Take by mouth.   clopidogrel 75 MG tablet Commonly known as: PLAVIX TAKE ONE TABLET BY MOUTH EVERY DAY   Dexcom G6 Receiver Devi Use as directed   Dexcom G6 Sensor Misc Use for 10 days and then change sensors.   Dexcom G6 Transmitter Misc Use as directed.   dicyclomine 20 MG tablet Commonly known as: BENTYL TAKE  ONE TABLET BY MOUTH THREE TIMES DAILY AS NEEDED FOR SPASMS   diphenoxylate-atropine 2.5-0.025 MG tablet Commonly known as: LOMOTIL TAKE ONE OR TWO TABLETS BY MOUTH TWICE DAILY AS NEEDED FOR DIARRHEA OR LOOSE STOOLS   DULoxetine 30 MG capsule Commonly known as: CYMBALTA Take 3 capsules (90 mg total) by mouth daily. Pt is taking total 90 mg daily   furosemide 40 MG tablet Commonly known as: LASIX Take 1-2 tablets (40-80 mg total) by mouth daily.   Gamunex-C 20 GM/200ML Soln Generic drug: Immune Globulin (Human)   Insulin Glargine Solostar 100 UNIT/ML Solostar Pen Commonly known as: LANTUS INJECT 42-50 UNITS into THE SKIN AT BEDTIME. (NEEDS APPOINTMENT BEFORE MORE REFILLS)   levothyroxine 112 MCG tablet Commonly known as: SYNTHROID Take 1 tablet (112 mcg total) by mouth daily.   lidocaine 5 % ointment Commonly known as: XYLOCAINE Apply 1 application topically daily. 2 hours before skin procedure.   meclizine 25 MG tablet Commonly known as: ANTIVERT Take 1 tablet (25 mg total) by mouth 3 (three) times daily as needed for dizziness.   metFORMIN 500 MG 24 hr tablet Commonly known as: GLUCOPHAGE-XR Take 2 tablets (1,000 mg total) by mouth daily with breakfast.   metoprolol tartrate 25 MG tablet Commonly known as: LOPRESSOR Take 1 tablet (25 mg total) by mouth 2 (two) times daily.   mycophenolate 500 MG tablet Commonly known as: CELLCEPT Take by mouth.   nitroGLYCERIN 0.4 MG SL tablet Commonly known as: NITROSTAT Place 1 tablet (0.4 mg total) under the tongue every 5 (five) minutes as needed.   nystatin 100000 UNIT/ML suspension Commonly known as: MYCOSTATIN Take 5 mLs (500,000 Units total) by mouth 4 (four) times daily. X one week.   nystatin-triamcinolone ointment Commonly known as: MYCOLOG apply ONE application topically TWICE DAILY FOR 2-3 WEEKS   potassium chloride SA 20 MEQ tablet Commonly known as: KLOR-CON M Take 1 tablet (20 mEq total) by mouth daily.    pregabalin 50 MG capsule Commonly known as: LYRICA Take 50 mg by mouth daily.  TAKE ONE CAPSULE (50 MG) BY MOUTH IN THE MORNING, AND TWO CAPSULES (100 MG) IN THE EVENING FOR TWO WEEKS, THEN ONE CAPSULE (50 MG) IN THE MORNING AND THREE CAPSULES (150 MG) IN THE EVENING until follow up   PreserVision AREDS 2 Caps   simvastatin 40 MG tablet Commonly known as: ZOCOR TAKE TWO TABLETS BY MOUTH AT BEDTIME   Trelegy Ellipta 100-62.5-25 MCG/ACT Aepb Generic drug: Fluticasone-Umeclidin-Vilant Inhale 1 puff into the lungs daily.   TRUEplus Pen Needles 32G X 4 MM Misc Generic drug: Insulin Pen Needle USE WHEN INJECTING INSULIN DAILY   zolpidem 5 MG tablet Commonly known as: AMBIEN Take 1/2-1 tablet (2.5-5 mg total) by mouth at bedtime.        History (reviewed): Past Medical History:  Diagnosis Date   Arthritis    Charcot-Marie-Tooth disease    COPD (chronic obstructive pulmonary disease) ([facility])  DDD (degenerative disc disease)    Lumbar and lumbosacral   Depression    Diabetes mellitus    type 2   Diabetic peripheral neuropathy (HCC)    Facet syndrome, lumbar    Gastric ulcer    w/o hemorrhage   Hyperlipidemia    Hypertension    Insomnia    Lumbosacral root lesions, not elsewhere classified    Neurogenic bladder    Obesity    Osteopenia    Other symptoms referable to back    Post-menopausal    PUD (peptic ulcer disease)    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   Spinal stenosis, lumbar region, without neurogenic claudication    Thoracic spondylosis without myelopathy    Thyroid disease    hypo   Tobacco dependence    Unspecified hereditary and idiopathic peripheral neuropathy    Past Surgical History:  Procedure Laterality Date   ANKLE RECONSTRUCTION     left ankle, plate and pins    bilateral L3 medial branch block     CHOLECYSTECTOMY     fibroid tumor removal     fluoroscopic L5 dorsal medial branch bloc     RENAL ARTERY STENT  11/28/2010    left   REPLACEMENT TOTAL KNEE     Family History  Problem Relation Age of Onset   Stroke Mother    Heart disease Father 66       heart attack   Hypertension Father    Cancer Father        lung   Diabetes Paternal Aunt        insulin dependent   Social History   Socioeconomic History   Marital status: Widowed    Spouse name: Not on file   Number of children: 2   Years of education: 28   Highest education level: Some college, no degree  Occupational History   Occupation: Retired  Tobacco Use   Smoking status: Every Day    Packs/day: 1.00    Years: 49.00    Additional pack years: 0.00    Total pack years: 49.00    Types: Cigarettes   Smokeless tobacco: Never   Tobacco comments:    1 pack   Vaping Use   Vaping Use: Never used  Substance and Sexual Activity   Alcohol use: No    Alcohol/week: 0.0 standard drinks of alcohol   Drug use: No   Sexual activity: Not Currently  Other Topics Concern   Not on file  Social History Narrative   Lives alone but her daughter lives close by and helps when she can. She has a cat. She is not able to read any more due to macular degeneration but she does listen to audio books.    Social Determinants of Health   Financial Resource Strain: Low Risk  (10/30/2022)   Overall Financial Resource Strain (CARDIA)    Difficulty of Paying Living Expenses: Not hard at all  Food Insecurity: No Food Insecurity (10/30/2022)   Hunger Vital Sign    Worried About Running Out of Food in the Last Year: Never true    Ran Out of Food in the Last Year: Never true  Transportation Needs: No Transportation Needs (10/30/2022)   PRAPARE - Hydrologist (Medical): No    Lack of Transportation (Non-Medical): No  Physical Activity: Inactive (10/30/2022)   Exercise Vital Sign    Days of Exercise per Week: 0 days  Minutes of Exercise per Session: 0 min  Stress: No Stress Concern Present (10/30/2022)   [place]    Feeling of Stress : Not at all  Social Connections: Moderately Isolated (10/30/2022)   Social Connection and Isolation Panel [NHANES]    Frequency of Communication with Friends and Family: More than three times a week    Frequency of Social Gatherings with Friends and Family: More than three times a week    Attends Religious Services: More than 4 times per year    Active Member of [employer] or Organizations: No    Attends Archivist Meetings: Never    Marital Status: Widowed    Activities of Daily Living    10/30/2022    1:11 PM  In your present state of health, do you have any difficulty performing the following activities:  Hearing? 0  Vision? 1  Comment macular degeneration  Difficulty concentrating or making decisions? 0  Walking or climbing stairs? 1  Comment wheelchair bound  Dressing or bathing? 0  Doing errands, shopping? 1  Comment her daughter drives her to the appointments.  Preparing Food and eating ? N  Using the Toilet? N  In the past six months, have you accidently leaked urine? N  Do you have problems with loss of bowel control? N  Managing your Medications? Y  Comment her daughter helps sets up the pill box for three weeks at a time.  Managing your Finances? Y  Comment her daughter manages her finances  Housekeeping or managing your Housekeeping? Y  Comment she has someone who comes by helps that    Patient Education/ Literacy How often do you need to have someone help you when you read instructions, pamphlets, or other written materials from your doctor or pharmacy?: 1 - Never What is the last grade level you completed in school?: one year of college  Exercise Current Exercise Habits: The patient does not participate in regular exercise at present, Exercise limited by: orthopedic condition(s)  Diet Patient reports consuming 2 meals a day and 0 snack(s) a day Patient reports that her primary  diet is: Regular Patient reports that she does have regular access to food.   Depression Screen    10/30/2022    1:07 PM 09/22/2022    1:55 PM 10/24/2021    1:03 PM 09/02/2021   11:54 AM 08/30/2021    9:58 AM 10/22/2020    3:14 PM 05/06/2020    1:33 PM  PHQ 2/9 Scores  PHQ - 2 Score 0 0 0 0 0 0 4  PHQ- 9 Score 0 3     13     Fall Risk    10/30/2022    1:07 PM 09/22/2022    1:54 PM 10/24/2021    1:03 PM 09/02/2021   11:54 AM 08/30/2021    9:58 AM  Fall Risk   Falls in the past year? 0 0 0 0 0  Number falls in past yr: 0 0 0 0 0  Injury with Fall? 0 0 0 0 0  Risk for fall due to : Impaired mobility;Impaired vision;Impaired balance/gait Impaired balance/gait;Impaired mobility No Fall Risks No Fall Risks No Fall Risks  Follow up Falls evaluation completed;Education provided;Falls prevention discussed Falls prevention discussed;Falls evaluation completed Falls evaluation completed Falls prevention discussed;Falls evaluation completed Falls prevention discussed;Falls evaluation completed     Objective:  Brownie Dutko seemed alert and oriented and she participated appropriately during our  telephone visit.  Blood Pressure Weight BMI  BP Readings from Last 3 Encounters:  09/22/22 115/64  05/15/22 128/73  09/13/21 (!) 87/53   Wt Readings from Last 3 Encounters:  09/22/22 196 lb (88.9 kg)  09/13/21 160 lb (72.6 kg)  06/13/21 166 lb (75.3 kg)   BMI Readings from Last 1 Encounters:  09/22/22 30.70 kg/m    *Unable to obtain current vital signs, weight, and BMI due to telephone visit type  Hearing/Vision  Hedda did not seem to have difficulty with hearing/understanding during the telephone conversation Reports that she has had a formal eye exam by an eye care professional within the past year Reports that she has not had a formal hearing evaluation within the past year *Unable to fully assess hearing and vision during telephone visit type  Cognitive Function:    10/30/2022    1:14 PM  10/24/2021    1:14 PM 10/22/2020    3:30 PM  6CIT Screen  What Year? 0 points 0 points 4 points  What month? 0 points 0 points 0 points  What time? 0 points 0 points 0 points  Count back from 20 0 points 0 points 0 points  Months in reverse 0 points 0 points 0 points  Repeat phrase 0 points 0 points 0 points  Total Score 0 points 0 points 4 points   (Normal:0-7, Significant for Dysfunction: >8)  Normal Cognitive Function Screening: Yes   Immunization & Health Maintenance Record Immunization History  Administered Date(s) Administered   Fluad Quad(high Dose 65+) 05/02/2019, 06/13/2021, 05/15/2022   H1N1 06/19/2008   Influenza Split 05/06/2012   Influenza Whole 05/28/2006, 06/13/2007, 05/28/2008, 07/05/2009, 05/06/2010, 04/24/2011   Influenza, High Dose Seasonal PF 05/09/2018, 05/02/2019   Influenza,inj,Quad PF,6+ Mos 05/07/2013, 03/27/2014, 05/03/2015, 04/03/2016   Influenza-Unspecified 05/06/2012, 05/07/2013, 03/27/2014, 05/03/2015, 04/03/2016, 05/06/2020   Janssen (J&J) SARS-COV-2 Vaccination 10/31/2019   Pneumococcal Conjugate-13 04/27/2014   Pneumococcal Polysaccharide-23 06/07/2001, 08/15/2006, 01/09/2012   Tdap 10/06/2011    Health Maintenance  Topic Date Due   DTaP/Tdap/Td (2 - Td or Tdap) 10/05/2021   OPHTHALMOLOGY EXAM  10/30/2022 (Originally 03/10/2021)   Diabetic kidney evaluation - Urine ACR  10/31/2022 (Originally 12/05/2017)   FOOT EXAM  10/31/2022 (Originally 05/01/2020)   COVID-19 Vaccine (2 - Janssen risk series) 10/08/2023 (Originally 11/28/2019)   Lung Cancer Screening  10/30/2023 (Originally 09/12/2013)   Zoster Vaccines- Shingrix (1 of 2) 12/21/2023 (Originally 08/22/1964)   HEMOGLOBIN A1C  03/23/2023   Diabetic kidney evaluation - eGFR measurement  09/23/2023   Medicare Annual Wellness (AWV)  10/30/2023   Pneumonia Vaccine 50+ Years old  Completed   INFLUENZA VACCINE  Completed   DEXA SCAN  Completed   Hepatitis C Screening  Completed   HPV VACCINES  Aged Out    COLONOSCOPY (Pts 45-74yrs Insurance coverage will need to be confirmed)  Discontinued       Assessment  This is a routine wellness examination for [employer].  Health Maintenance: Due or Overdue Health Maintenance Due  Topic Date Due   DTaP/Tdap/Td (2 - Td or Tdap) 10/05/2021    [name] does not need a referral for Community Assistance: Care Management:   no Social Work:    no Prescription Assistance:  no Nutrition/Diabetes Education:  no   Plan:  Personalized Goals  Goals Addressed               This Visit's Progress     Patient Stated (pt-stated)  Patient would like to be more active when the weather is nicer.       Personalized Health Maintenance & Screening Recommendations  Urine ACR Foot exam Td vaccine Shingles vaccine Eye exam Lung cancer screening  Patient declined the shingles vaccine.  Lung Cancer Screening Recommended: yes (Low Dose CT Chest recommended if Age 59-80 years, 30 pack-year currently smoking OR have quit w/in past 15 years) Hepatitis C Screening recommended: no HIV Screening recommended: no  Advanced Directives: Written information was not prepared per patient's request.  Referrals & Orders Orders Placed This Encounter  Procedures   Ambulatory Referral for Lung Cancer Scre    Follow-up Plan Follow-up with Hali Marry, MD as planned Schedule td vaccine at the pharmacy.  Foot exam and Urine ACR can be done at the next office visit. She will have the eye exam records faxed over. Medicare wellness visit in one year.  AVS printed and mailed to the patient.   I have personally reviewed and noted the following in the patient's chart:   Medical and social history Use of alcohol, tobacco or illicit drugs  Current medications and supplements Functional ability and status Nutritional status Physical activity Advanced directives List of other physicians Hospitalizations, surgeries, and ER visits in  previous 12 months Vitals Screenings to include cognitive, depression, and falls Referrals and appointments  In addition, I have reviewed and discussed with [name] certain preventive protocols, quality metrics, and best practice recommendations. A written personalized care plan for preventive services as well as general preventive health recommendations is available and can be mailed to the patient at her request.      Tinnie Gens. RN BSN  10/30/2022

## 2022-11-01 ENCOUNTER — Ambulatory Visit: Payer: Self-pay

## 2022-11-01 NOTE — Patient Instructions (Signed)
Visit Information  Thank you for taking time to visit with me today. Please don't hesitate to contact me if I can be of assistance to you.   Following are the goals we discussed today:   Goals Addressed             This Visit's Progress    COMPLETED: Care Coordination Activities Health Management       Interventions Today    Flowsheet Row Most Recent Value  General Interventions   General Interventions Discussed/Reviewed General Interventions Reviewed, Annual Eye Exam  Health Screening --  [patient completed annual wellness visit 10/30/22]  Education Interventions   Education Provided Provided Education  Provided Verbal Education On Other  [encouraged to continue to eat healthy, attend provider visits as scheduled, encouraged to contact RNCM if care coordination needs in the future.]           If you are experiencing a Mental Health or [place] or need someone to talk to, please call the Suicide and Crisis Lifeline: [place], RN, MSN, BSN, [place] Coordinator [phone]

## 2022-11-01 NOTE — Patient Outreach (Signed)
  Care Coordination   Follow Up Visit Note   11/01/2022 Name: [name] MRN: [id number] DOB: [birth year]  [name] is a 77 y.o. year old female who sees Metheney, Rene Kocher, MD for primary care. I spoke with  [name] by phone today.  What matters to the patients health and wellness today?  [name] reports, "I've been doing great!". Per review of chart, Annual wellness visit completed 10/30/22. [name] denies any questions or concerns at this time. Patient to contact RNCM through PCP if care coordination, disease education or resource needs in the future.  Goals Addressed             This Visit's Progress    COMPLETED: Care Coordination Activities Health Management       Interventions Today    Flowsheet Row Most Recent Value  General Interventions   General Interventions Discussed/Reviewed General Interventions Reviewed, Annual Eye Exam  Health Screening --  [patient completed annual wellness visit 10/30/22]  Education Interventions   Education Provided Provided Education  Provided Verbal Education On Other  [encouraged to continue to eat healthy, attend provider visits as scheduled, encouraged to contact RNCM if care coordination needs in the future.]           SDOH assessments and interventions completed:  No  Care Coordination Interventions:  Yes, provided   Follow up plan: No further intervention required.   Encounter Outcome:  Pt. Visit Completed

## 2022-11-07 DIAGNOSIS — H26491 Other secondary cataract, right eye: Secondary | ICD-10-CM | POA: Diagnosis not present

## 2022-11-13 ENCOUNTER — Telehealth: Payer: Self-pay

## 2022-11-13 ENCOUNTER — Other Ambulatory Visit: Payer: Self-pay | Admitting: Family Medicine

## 2022-11-13 DIAGNOSIS — E1140 Type 2 diabetes mellitus with diabetic neuropathy, unspecified: Secondary | ICD-10-CM

## 2022-11-13 NOTE — Telephone Encounter (Signed)
[name] called and states she takes 60 units of Lantus daily. Can we send an updated prescription. She is schedule for an appointment in May.

## 2022-11-14 MED ORDER — LANTUS SOLOSTAR 100 UNIT/ML ~~LOC~~ SOPN: 60.0000 [IU] | PEN_INJECTOR | Freq: Every day | SUBCUTANEOUS | 2 refills | Status: DC

## 2022-11-14 NOTE — Telephone Encounter (Signed)
Ordrer pneded. Not sure local or M.O.

## 2022-11-14 NOTE — Telephone Encounter (Signed)
Medication sent, patient is aware 

## 2022-11-14 NOTE — Telephone Encounter (Signed)
[facility]. Checked chart and this is where the prescription was sent.

## 2022-11-28 ENCOUNTER — Telehealth: Payer: Self-pay | Admitting: Family Medicine

## 2022-11-28 NOTE — Telephone Encounter (Signed)
Please call patient and let her know that we did refer her for lung cancer screening.  Unfortunately Medicare will only cover it up to age 78 and since she is 72 she will not qualify.  But we could consider getting a chest x-ray next time she is here.  We see if she has had a recent eye exam so that we can get her chart updated encouraged her to please get her tetanus updated at her local pharmacy.

## 2022-11-29 NOTE — Telephone Encounter (Signed)
Patient advised. I called Dr Roselie Awkward office for her last eye exam.

## 2022-12-07 ENCOUNTER — Encounter: Payer: Self-pay | Admitting: Podiatry

## 2022-12-07 ENCOUNTER — Ambulatory Visit (INDEPENDENT_AMBULATORY_CARE_PROVIDER_SITE_OTHER): Payer: Medicare Other | Admitting: Podiatry

## 2022-12-07 DIAGNOSIS — E1142 Type 2 diabetes mellitus with diabetic polyneuropathy: Secondary | ICD-10-CM

## 2022-12-07 DIAGNOSIS — M79675 Pain in left toe(s): Secondary | ICD-10-CM

## 2022-12-07 DIAGNOSIS — B351 Tinea unguium: Secondary | ICD-10-CM

## 2022-12-07 DIAGNOSIS — M79674 Pain in right toe(s): Secondary | ICD-10-CM | POA: Diagnosis not present

## 2022-12-07 NOTE — Progress Notes (Signed)
  Subjective:  Patient ID: [name], female    DOB: 04-19-1946,   MRN: [id number]  Chief Complaint  Patient presents with   Diabetes    Diabetic foot care, nail trim, A1c- 6.8 BG- 7    77 y.o. female presents for concern of thickened elongated and painful nails that are difficult to trim. Requesting to have them trimmed today. Denies any burning or tingling in her feet. Patient is diabetic and last A1c was 14.   . Denies any other pedal complaints. Denies n/v/f/c.   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 CFT <3 seconds. Absent hair growth on digits. Edema noted to bilateral lower extremities. Xerosis noted bilaterally.  Skin. No lacerations or abrasions bilateral feet. Nails 1-5 bilateral  are thickened discolored and elongated with subungual debris. Maceration noted in 3rd and fourth interspaced bilateral.  Musculoskeletal: MMT 5/5 bilateral lower extremities in DF, PF, Inversion and Eversion. Deceased ROM in DF of ankle joint.  Neurological: Sensation intact to  light touch. Protective sensation diminished bilateral.    Assessment:   1. Pain due to onychomycosis of toenails of both feet   2. Type 2 diabetes mellitus with diabetic polyneuropathy, unspecified whether long term insulin use (HCC)        Plan:  Patient was evaluated and treated and all questions answered. -Discussed and educated patient on diabetic foot care, especially with  regards to the vascular, neurological and musculoskeletal systems.  -Stressed the importance of good glycemic control and the detriment of not  controlling glucose levels in relation to the foot. -Discussed supportive shoes at all times and checking feet regularly.  -Mechanically debrided all nails 1-5 bilateral using sterile nail nipper and filed with dremel without incident  -Advised to keep betadine on the macerated areas in between toes.  -Answered all patient questions -Patient to return  in 3 months for at risk foot care -Patient advised to call the office if any problems or questions arise in the meantime.    Louann Sjogren, DPM

## 2022-12-10 ENCOUNTER — Other Ambulatory Visit: Payer: Self-pay | Admitting: Family Medicine

## 2022-12-12 ENCOUNTER — Telehealth: Payer: Self-pay | Admitting: Family Medicine

## 2022-12-12 DIAGNOSIS — J449 Chronic obstructive pulmonary disease, unspecified: Secondary | ICD-10-CM

## 2022-12-12 MED ORDER — ALBUTEROL SULFATE HFA 108 (90 BASE) MCG/ACT IN AERS: 2.0000 | INHALATION_SPRAY | Freq: Four times a day (QID) | RESPIRATORY_TRACT | 99 refills | Status: DC | PRN

## 2022-12-12 NOTE — Telephone Encounter (Signed)
Pt advised that medication has been sent.  

## 2022-12-12 NOTE — Telephone Encounter (Signed)
Patient called she is requesting a new prescription for her albuterol inhaler request it be sent To [facility]

## 2022-12-25 ENCOUNTER — Ambulatory Visit (INDEPENDENT_AMBULATORY_CARE_PROVIDER_SITE_OTHER): Payer: Medicare Other | Admitting: Family Medicine

## 2022-12-25 ENCOUNTER — Encounter: Payer: Self-pay | Admitting: Family Medicine

## 2022-12-25 VITALS — BP 115/69 | HR 82 | Ht 67.0 in | Wt 203.0 lb

## 2022-12-25 DIAGNOSIS — G4733 Obstructive sleep apnea (adult) (pediatric): Secondary | ICD-10-CM

## 2022-12-25 DIAGNOSIS — E1140 Type 2 diabetes mellitus with diabetic neuropathy, unspecified: Secondary | ICD-10-CM

## 2022-12-25 DIAGNOSIS — J449 Chronic obstructive pulmonary disease, unspecified: Secondary | ICD-10-CM | POA: Diagnosis not present

## 2022-12-25 DIAGNOSIS — I878 Other specified disorders of veins: Secondary | ICD-10-CM | POA: Insufficient documentation

## 2022-12-25 LAB — POCT GLYCOSYLATED HEMOGLOBIN (HGB A1C): Hemoglobin A1C: 5.3 % (ref 4.0–5.6)

## 2022-12-25 MED ORDER — LANTUS SOLOSTAR 100 UNIT/ML ~~LOC~~ SOPN: 50.0000 [IU] | PEN_INJECTOR | Freq: Every day | SUBCUTANEOUS | 2 refills | Status: DC

## 2022-12-25 NOTE — Progress Notes (Addendum)
Established Patient Office Visit  Subjective   Patient ID: [name], female    DOB: 05/02/1946  Age: 77 y.o. MRN: [id number]  Chief Complaint  Patient presents with   Diabetes    HPI  Diabetes - no hypoglycemic events. No wounds or sores that are not healing well. No increased thirst or urination. Checking glucose at home. Taking medications as prescribed without any side effects. Due for foot exam and Urine micro. Gets her nail cut with Dr. Ralene Cork.  She is interested in diabetic shoes.  History of neuropathy.    F/U COPD -says she needs a new nebulizer machine because it is broken she also needs a new CPAP machine because it was outdated and when one of the nurses came into her home and went through everything they actually tossed it.  Is also concerned about her lower extremity edema it is chronic and she has difficulty fitting shoes.  She is swollen today but says this is actually little bit better today.  She really does not want to have to elevate her legs during the day though her daughter encourages her to do so.  Always looks better in the morning and gets worse as the day goes on.  She really tries to keep salt to a minimum in her diet.    ROS    Objective:     BP 115/69   Pulse 82   Ht 5\' 7"  (1.702 m)   Wt 203 lb (92.1 kg)   SpO2 93%   BMI 31.79 kg/m    Physical Exam Vitals and nursing note reviewed.  Constitutional:      Appearance: She is well-developed.  HENT:     Head: Normocephalic and atraumatic.  Cardiovascular:     Rate and Rhythm: Normal rate and regular rhythm.     Heart sounds: Normal heart sounds.  Pulmonary:     Effort: Pulmonary effort is normal.     Breath sounds: Normal breath sounds.  Skin:    General: Skin is warm and dry.  Neurological:     Mental Status: She is alert and oriented to person, place, and time.  Psychiatric:        Behavior: Behavior normal.      Results for orders placed or performed in visit on 12/25/22   POCT glycosylated hemoglobin (Hb A1C)  Result Value Ref Range   Hemoglobin A1C 5.3 4.0 - 5.6 %   HbA1c POC (<> result, manual entry)     HbA1c, POC (prediabetic range)     HbA1c, POC (controlled diabetic range)        The ASCVD Risk score (Arnett DK, et al., 2019) failed to calculate for the following reasons:   The patient has a prior MI or stroke diagnosis    Assessment & Plan:   Problem List Items Addressed This Visit       Respiratory   OSA (obstructive sleep apnea)    Try to get a new CPAP machine ordered.  Will set her for AutoPap for now.      Relevant Orders   For home use only DME continuous positive airway pressure (CPAP)   COPD, group D, by GOLD 2017 classification (HCC)    Will try to get her a new nebulizer machine.      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  And if after 1 to 2 weeks she still noticing some lows to give [place] a call back.  She is a great candidate for diabetic shoes.  See documented foot exam below.  She follows up with her podiatrist next week and can discuss initiating getting diabetic shoes.      Relevant Medications   insulin glargine (LANTUS SOLOSTAR) 100 UNIT/ML Solostar Pen   Other Relevant Orders   POCT glycosylated hemoglobin (Hb A1C) (Completed)     Other   Venous stasis    Discussed diagnosis and nature of condition.  We discussed treatment with compression she does not think she can do compression stockings so we discussed at least using Ace wrap's especially when they are more swollen she can put them on in the morning then take them off in the evening after her evening meal and to try to keep feet elevated for about 15  to 20 minutes 3-4 times a day.      Diabetic Foot Exam - Simple   Simple Foot Form Diabetic Foot exam was performed with the following findings: Yes 12/25/2022  4:17 PM  Visual Inspection No deformities, no ulcerations, no other skin breakdown bilaterally: Yes See comments: Yes Sensation Testing Intact to touch and monofilament testing bilaterally: Yes See comments: Yes Pulse Check See comments: Yes Comments Pt has bilateral swelling in feet, her feet are dusky in color and cold. She was able to somewhat feel the monofilament testing on her R foot but did not feel at sites #5,6,7,8,9,10. unable to feel anything on her L.       Return in about 4 months (around 04/27/2023) for dm.    Nani Gasser, MD

## 2022-12-25 NOTE — Patient Instructions (Signed)
Please get your Tetanus vaccine done at the pharmacy.

## 2022-12-25 NOTE — Assessment & Plan Note (Addendum)
[name] looks great today in fact it is a little on the lower end and she has been having a couple of hypoglycemic events on average about twice a week so we did discuss decreasing her long-acting insulin by 10 units, down to 50.  And if after 1 to 2 weeks she still noticing some lows to give [place] a call back.  She is a great candidate for diabetic shoes.  See documented foot exam below.  She follows up with her podiatrist next week and can discuss initiating getting diabetic shoes.

## 2022-12-25 NOTE — Assessment & Plan Note (Signed)
Will try to get her a new nebulizer machine.

## 2022-12-25 NOTE — Assessment & Plan Note (Signed)
Discussed diagnosis and nature of condition.  We discussed treatment with compression she does not think she can do compression stockings so we discussed at least using Ace wrap's especially when they are more swollen she can put them on in the morning then take them off in the evening after her evening meal and to try to keep feet elevated for about 15 to 20 minutes 3-4 times a day.

## 2022-12-25 NOTE — Assessment & Plan Note (Signed)
Try to get a new CPAP machine ordered.  Will set her for AutoPap for now.

## 2023-01-04 ENCOUNTER — Other Ambulatory Visit: Payer: Self-pay | Admitting: Family Medicine

## 2023-01-05 ENCOUNTER — Ambulatory Visit: Payer: Medicare Other | Admitting: Family Medicine

## 2023-01-05 NOTE — Progress Notes (Deleted)
   Acute Office Visit  Subjective:     Patient ID: [name], female    DOB: [birth year], 77 y.o.   MRN: [id number]  No chief complaint on file.   HPI Patient is in today for ***  ROS      Objective:    There were no vitals taken for this visit. {Vitals History (Optional):23777}  Physical Exam  No results found for any visits on 01/05/23.      Assessment & Plan:   Problem List Items Addressed This Visit       Respiratory   COPD (chronic obstructive pulmonary disease) with chronic bronchitis - Primary    No orders of the defined types were placed in this encounter.   No follow-ups on file.  Nani Gasser, MD

## 2023-01-13 ENCOUNTER — Other Ambulatory Visit: Payer: Self-pay | Admitting: Family Medicine

## 2023-01-16 ENCOUNTER — Encounter: Payer: Self-pay | Admitting: Physician Assistant

## 2023-01-16 ENCOUNTER — Ambulatory Visit (INDEPENDENT_AMBULATORY_CARE_PROVIDER_SITE_OTHER): Payer: Medicare Other | Admitting: Physician Assistant

## 2023-01-16 ENCOUNTER — Ambulatory Visit (INDEPENDENT_AMBULATORY_CARE_PROVIDER_SITE_OTHER): Payer: Medicare Other

## 2023-01-16 VITALS — BP 101/64 | HR 84 | Ht 67.0 in | Wt 203.0 lb

## 2023-01-16 DIAGNOSIS — R0989 Other specified symptoms and signs involving the circulatory and respiratory systems: Secondary | ICD-10-CM | POA: Diagnosis not present

## 2023-01-16 DIAGNOSIS — J984 Other disorders of lung: Secondary | ICD-10-CM | POA: Diagnosis not present

## 2023-01-16 DIAGNOSIS — J986 Disorders of diaphragm: Secondary | ICD-10-CM | POA: Diagnosis not present

## 2023-01-16 DIAGNOSIS — J4489 Other specified chronic obstructive pulmonary disease: Secondary | ICD-10-CM | POA: Diagnosis not present

## 2023-01-16 DIAGNOSIS — J441 Chronic obstructive pulmonary disease with (acute) exacerbation: Secondary | ICD-10-CM | POA: Diagnosis not present

## 2023-01-16 MED ORDER — PREDNISONE 20 MG PO TABS
ORAL_TABLET | ORAL | 0 refills | Status: DC
Start: 2023-01-16 — End: 2023-03-13

## 2023-01-16 MED ORDER — AZITHROMYCIN 250 MG PO TABS
ORAL_TABLET | ORAL | 0 refills | Status: DC
Start: 2023-01-16 — End: 2023-02-07

## 2023-01-16 MED ORDER — IPRATROPIUM-ALBUTEROL 0.5-2.5 (3) MG/3ML IN SOLN
3.0000 mL | RESPIRATORY_TRACT | 0 refills | Status: DC | PRN
Start: 2023-01-16 — End: 2023-07-26

## 2023-01-16 NOTE — Patient Instructions (Signed)

## 2023-01-16 NOTE — Progress Notes (Signed)
Official read shows concern for pneumonia of right lung pneumonia. Zpak should treat. Follow up as needed or if not improving.

## 2023-01-16 NOTE — Progress Notes (Signed)
Acute Office Visit  Subjective:     Patient ID: [name], female    DOB: Mar 15, 1946, 77 y.o.   MRN: [id number]  Chief Complaint  Patient presents with   Cough    HPI Patient is in today for lung crackles heard in the right lower lung by home health nurse. Pt has hx of COPD, OSA, T2DM, CKD, tobacco dependence. She has had more productive cough for last few weeks. No fever but feels like has had chills. She is using Trelegy and albuterol. She has used some mucinex with no benefit. Dr. Judie Petit, PCP, just decreased her insulin due to low A1C.   Concerned about a "rough spot" felt on lower left abdomen.  .. 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   COPD, group D, by GOLD 2017 classification (HCC) 05/22/2007   GASTRIC ULCER ACUTE WITHOUT HEMORRHAGE 05/15/2006   PEPTIC ULCER DISEASE 02/06/2009   Chronic kidney disease (CKD) stage G3b/A2, moderately decreased glomerular filtration rate (GFR) between 30-44 mL/min/1.73 square meter and albuminuria creatinine ratio between 30-299 mg/g (HCC) 02/10/2010   NEUROGENIC BLADDER 02/06/2009   Osteoarthritis 02/06/2009   Cervical paraspinal muscle spasm 06/21/2010   DEGENERATION, LUMBAR/LUMBOSACRAL DISC 06/25/2006   NECK PAIN, CHRONIC 06/21/2010   Osteoporosis 08/04/2008   INSOMNIA 05/28/2008   Lumbar spondylosis 03/22/2012   AAA (abdominal aortic aneurysm) without rupture (HCC) 04/19/2012   Diastolic dysfunction 04/08/2014   Peripheral neuropathy 02/11/2015   Right carpal tunnel syndrome 02/11/2015   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   Recurrent HSV (herpes simplex virus)    COPD (chronic obstructive pulmonary disease) with chronic bronchitis 12/05/2016   OSA (obstructive sleep apnea) 02/05/2017   Urethral stricture 05/28/2013   Microscopic hematuria 05/28/2013   Combined forms of age-related cataract of left eye 09/06/2017   Atrophic vaginitis 05/28/2013   Bursitis of right shoulder 09/10/2019   Chronic pain syndrome 09/10/2019   Lumbar facet joint syndrome 09/10/2019   Lumbosacral spondylosis without myelopathy 09/10/2019   Primary osteoarthritis of both shoulders 09/10/2019   Renal artery stenosis (HCC) 07/14/2010   Hyponatremia 06/24/2020   Thoracic aortic aneurysm without rupture (HCC) 10/03/2020   Dehydration 11/24/2020   Difficulty with speech 11/24/2020   Paresthesias in right hand 11/24/2020   Tremor of both hands 11/24/2020   Weakness 12/17/2020   Vertigo 06/13/2021   Bilateral foot-drop 06/13/2021   Slow transit constipation 09/22/2022   Venous stasis 12/25/2022   Resolved Ambulatory Problems    Diagnosis Date Noted   NEUROPATHY 02/06/2009   NEUROPATHY, PERIPHERAL 05/15/2006   OTITIS EXTERNA 03/09/2010   HYPERTENSION, BENIGN SYSTEMIC 05/15/2006   RENAL ARTERY STENOSIS 07/14/2010   AAA 07/14/2010   HYPOTENSION 02/10/2010   GERD 06/21/2010   Acute gastritis without mention of hemorrhage 11/09/2009   SYNCOPE 02/06/2009   Edema 03/09/2010   NAUSEA AND VOMITING 03/25/2010   ABDOMINAL DISTENSION 06/21/2010   RLQ PAIN 02/04/2010   POSTMENOPAUSAL STATUS 07/28/2008   DERMATITIS, ALLERGIC 09/29/2010   Insomnia 01/14/2014   Tobacco dependence 05/15/2006   URI (upper respiratory infection) 07/21/2020   Left foot drop 02/10/2021  Past Medical History:  Diagnosis Date   Arthritis    Charcot-Marie-Tooth disease    COPD (chronic obstructive pulmonary disease) (HCC)    DDD (degenerative disc disease)    Depression    Diabetes  mellitus    Facet syndrome, lumbar    Gastric ulcer    Hyperlipidemia    Hypertension    Lumbosacral root lesions, not elsewhere classified    Neurogenic bladder    Obesity    Osteopenia    Other symptoms referable to back    Post-menopausal    PUD (peptic ulcer disease)    Spinal stenosis, lumbar region, without neurogenic claudication    Thoracic spondylosis without myelopathy    Thyroid disease      ROS  See HPI.     Objective:    BP 101/64 (BP Location: Left Arm, Patient Position: Sitting, Cuff Size: Normal)   Pulse 84   Ht 5\' 7"  (1.702 m)   Wt 203 lb (92.1 kg)   SpO2 97%   BMI 31.79 kg/m  BP Readings from Last 3 Encounters:  01/16/23 101/64  12/25/22 115/69  09/22/22 115/64   Wt Readings from Last 3 Encounters:  01/16/23 203 lb (92.1 kg)  12/25/22 203 lb (92.1 kg)  09/22/22 196 lb (88.9 kg)      Physical Exam Constitutional:      Appearance: Normal appearance. She is obese.  HENT:     Head: Normocephalic.  Cardiovascular:     Rate and Rhythm: Normal rate.  Pulmonary:     Effort: Pulmonary effort is normal.     Comments: Bilateral lower lung crackles Musculoskeletal:     Right lower leg: Edema present.     Left lower leg: Edema present.     Comments: Chronic leg edema wrapped with ace wrap  Skin:    Comments: Waxy brown lesion of lower left abdomen  Neurological:     General: No focal deficit present.     Mental Status: She is alert and oriented to person, place, and time.  Psychiatric:        Mood and Affect: Mood normal.          Assessment & Plan:  Marland KitchenMarland KitchenAlmee was seen today for cough.  Diagnoses and all orders for this visit:  COPD exacerbation (HCC) -     predniSONE (DELTASONE) 20 MG tablet; Take one tablet twice daily for 5 days. -     azithromycin (ZITHROMAX Z-PAK) 250 MG tablet; Take 2 tablets (500 mg) on  Day 1,  followed by 1 tablet (250 mg) once daily on Days 2 through 5. -     For home use only DME Nebulizer machine -      ipratropium-albuterol (DUONEB) 0.5-2.5 (3) MG/3ML SOLN; Take 3 mLs by nebulization every 4 (four) hours as needed.  Lung crackles -     DG Chest 2 View; Future -     predniSONE (DELTASONE) 20 MG tablet; Take one tablet twice daily for 5 days. -     azithromycin (ZITHROMAX Z-PAK) 250 MG tablet; Take 2 tablets (500 mg) on  Day 1,  followed by 1 tablet (250 mg) once daily on Days 2 through 5. -     ipratropium-albuterol (DUONEB) 0.5-2.5 (3) MG/3ML SOLN; Take 3 mLs by nebulization every 4 (four) hours as needed.  COPD (chronic obstructive pulmonary disease) with chronic bronchitis -     For home use only DME Nebulizer machine -     ipratropium-albuterol (DUONEB) 0.5-2.5 (3) MG/3ML SOLN;  Take 3 mLs by nebulization every 4 (four) hours as needed.   Reassured pt of rough spot being SK.   CXR STAT ordered but never gave official read. Appeared like some consolidation in the left lower lobe.  Vitals stable,pulse ox 97 percent.  Sent nebulizer to DME company [employer] and prednisone sent to pharmacy Increase lantus by 5 units at bedtime tonight 55 units and then call in daily with readings until done with prednisone Follow up as needed if symptoms persist or worsen      Tandy Gaw, PA-C

## 2023-01-17 ENCOUNTER — Ambulatory Visit: Payer: Medicare Other | Admitting: Physician Assistant

## 2023-01-18 ENCOUNTER — Telehealth: Payer: Self-pay | Admitting: Family Medicine

## 2023-01-18 NOTE — Telephone Encounter (Signed)
FYI  Patient called stating her glucose levels today were 201.

## 2023-01-22 ENCOUNTER — Telehealth: Payer: Self-pay | Admitting: Family Medicine

## 2023-01-22 NOTE — Telephone Encounter (Signed)
Spoke w/pt to see how she was feeling. She stated that she had finished the ABX and the Prednisone. She has has some sweats, still has a cough that's not really productive, sore throat, diarrhea that has been between soft and watery. She has been hydrating. She doesn't believe that she has a temperature.   She feels that she may need a "stronger" ABX. Will forward to [facility] PA-C for advice.

## 2023-01-22 NOTE — Telephone Encounter (Signed)
Patient called to give her glucose readings from the last few days. Patient stated she is not feeling any better.  6/15 - 340 6/16 - 301 6/17 - 204

## 2023-01-22 NOTE — Telephone Encounter (Signed)
Called pt and advised her of recommendations and advised her that should her sxs get worse to call back or seek immediate care via UC or ED. She voiced understanding and agreed.

## 2023-01-23 ENCOUNTER — Encounter: Payer: Self-pay | Admitting: Family Medicine

## 2023-01-24 ENCOUNTER — Telehealth: Payer: Self-pay | Admitting: Family Medicine

## 2023-01-24 ENCOUNTER — Other Ambulatory Visit: Payer: Self-pay | Admitting: Family Medicine

## 2023-01-24 DIAGNOSIS — B370 Candidal stomatitis: Secondary | ICD-10-CM

## 2023-01-24 MED ORDER — NYSTATIN 100000 UNIT/ML MT SUSP
5.0000 mL | Freq: Four times a day (QID) | OROMUCOSAL | 1 refills | Status: DC
Start: 2023-01-24 — End: 2023-07-10

## 2023-01-24 NOTE — Telephone Encounter (Signed)
Pt called.  She  states she is getting thrush again in her mouth and want some more of that swish and swallow solution.  She states that usually she does not need an appointment.

## 2023-02-05 ENCOUNTER — Other Ambulatory Visit: Payer: Self-pay | Admitting: Family Medicine

## 2023-02-07 ENCOUNTER — Telehealth: Payer: Self-pay | Admitting: Family Medicine

## 2023-02-07 ENCOUNTER — Other Ambulatory Visit: Payer: Self-pay | Admitting: *Deleted

## 2023-02-07 NOTE — Telephone Encounter (Signed)
Patient called requesting a refill of furosemide (LASIX) 40 MG tablet [[id number]] (fluid pills)  Patient will run out of this medication on Tuesday (7/9)  Pharmacy ;   [facility] - [place], [place] - [address] [place] [address]

## 2023-02-07 NOTE — Telephone Encounter (Signed)
Patient has been informed.

## 2023-02-07 NOTE — Telephone Encounter (Signed)
Rx sent on 02/06/2023 to [facility] pharmacy.

## 2023-02-07 NOTE — Progress Notes (Unsigned)
This medication was sent on 02/06/2023 to [facility] pharmacy

## 2023-02-12 ENCOUNTER — Telehealth: Payer: Self-pay | Admitting: Family Medicine

## 2023-02-12 DIAGNOSIS — E1140 Type 2 diabetes mellitus with diabetic neuropathy, unspecified: Secondary | ICD-10-CM

## 2023-02-12 NOTE — Telephone Encounter (Signed)
Pt called. Pt states she only has 3 days left of her insulin due to it being increased when she was on steroids for pneumonia. Insulin not due to refill until 7/21.  Please advise.

## 2023-02-12 NOTE — Telephone Encounter (Signed)
While she was on steroids she was taking 70 units daily. She is now taking 55 units daily. She will need a change in the amount because she cannot pick up next prescription until. 02/25/23.

## 2023-02-13 MED ORDER — LANTUS SOLOSTAR 100 UNIT/ML ~~LOC~~ SOPN
50.0000 [IU] | PEN_INJECTOR | Freq: Every day | SUBCUTANEOUS | 2 refills | Status: DC
Start: 2023-02-13 — End: 2023-06-08

## 2023-02-13 NOTE — Telephone Encounter (Signed)
Meds ordered this encounter  Medications   insulin glargine (LANTUS SOLOSTAR) 100 UNIT/ML Solostar Pen    Sig: Inject 50-70 Units into the skin at bedtime.    Dispense:  30 mL    Refill:  2    This prescription was filled on 10/25/2022. Any refills authorized will be placed on file.

## 2023-02-15 DIAGNOSIS — G629 Polyneuropathy, unspecified: Secondary | ICD-10-CM | POA: Diagnosis not present

## 2023-02-15 DIAGNOSIS — G894 Chronic pain syndrome: Secondary | ICD-10-CM | POA: Diagnosis not present

## 2023-02-15 DIAGNOSIS — G6181 Chronic inflammatory demyelinating polyneuritis: Secondary | ICD-10-CM | POA: Diagnosis not present

## 2023-02-15 DIAGNOSIS — Z79891 Long term (current) use of opiate analgesic: Secondary | ICD-10-CM | POA: Diagnosis not present

## 2023-02-15 DIAGNOSIS — G43709 Chronic migraine without aura, not intractable, without status migrainosus: Secondary | ICD-10-CM | POA: Diagnosis not present

## 2023-03-07 ENCOUNTER — Other Ambulatory Visit: Payer: Self-pay | Admitting: Family Medicine

## 2023-03-07 DIAGNOSIS — R21 Rash and other nonspecific skin eruption: Secondary | ICD-10-CM

## 2023-03-07 DIAGNOSIS — B372 Candidiasis of skin and nail: Secondary | ICD-10-CM

## 2023-03-08 ENCOUNTER — Other Ambulatory Visit: Payer: Self-pay | Admitting: Family Medicine

## 2023-03-09 ENCOUNTER — Ambulatory Visit (INDEPENDENT_AMBULATORY_CARE_PROVIDER_SITE_OTHER): Payer: Medicare Other | Admitting: Podiatry

## 2023-03-09 ENCOUNTER — Encounter: Payer: Self-pay | Admitting: Podiatry

## 2023-03-09 DIAGNOSIS — B351 Tinea unguium: Secondary | ICD-10-CM | POA: Diagnosis not present

## 2023-03-09 DIAGNOSIS — M21372 Foot drop, left foot: Secondary | ICD-10-CM

## 2023-03-09 DIAGNOSIS — M79674 Pain in right toe(s): Secondary | ICD-10-CM

## 2023-03-09 DIAGNOSIS — E1142 Type 2 diabetes mellitus with diabetic polyneuropathy: Secondary | ICD-10-CM

## 2023-03-09 DIAGNOSIS — G63 Polyneuropathy in diseases classified elsewhere: Secondary | ICD-10-CM

## 2023-03-09 DIAGNOSIS — M2041 Other hammer toe(s) (acquired), right foot: Secondary | ICD-10-CM

## 2023-03-09 DIAGNOSIS — M79675 Pain in left toe(s): Secondary | ICD-10-CM

## 2023-03-09 DIAGNOSIS — M21371 Foot drop, right foot: Secondary | ICD-10-CM

## 2023-03-09 DIAGNOSIS — M2042 Other hammer toe(s) (acquired), left foot: Secondary | ICD-10-CM

## 2023-03-09 NOTE — Progress Notes (Signed)
  Subjective:  Patient ID: [name], female    DOB: 1945-10-27,   MRN: [id number]  Chief Complaint  Patient presents with   Nail Problem    rfc     77 y.o. female presents for concern of thickened elongated and painful nails that are difficult to trim. Requesting to have them trimmed today. Denies any burning or tingling in her feet. Patient is diabetic and last A1c was 14.   . Denies any other pedal complaints. Denies n/v/f/c.   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 CFT <3 seconds. Absent hair growth on digits. Edema noted to bilateral lower extremities. Xerosis noted bilaterally.  Skin. No lacerations or abrasions bilateral feet. Nails 1-5 bilateral  are thickened discolored and elongated with subungual debris. Maceration noted in 3rd and fourth interspaced bilateral.  Musculoskeletal: MMT 5/5 bilateral lower extremities in DF, PF, Inversion and Eversion. Deceased ROM in DF of ankle joint.  Neurological: Sensation intact to light touch. Protective sensation  diminished bilateral.    Assessment:   1. Pain due to onychomycosis of toenails of both feet   2. Type 2 diabetes mellitus with diabetic polyneuropathy, unspecified whether long term insulin use (HCC)   3. Polyneuropathy associated with underlying disease (HCC)   4. Bilateral foot-drop   5. Hammertoes of both feet        Plan:  Patient was evaluated and treated and all questions answered. -Discussed and educated patient on diabetic foot care, especially with  regards to the vascular, neurological and musculoskeletal systems.  -Stressed the importance of good glycemic control and the detriment of not  controlling glucose levels in relation to the foot. -Discussed supportive shoes at all times and checking feet regularly.  -Mechanically debrided all nails 1-5 bilateral using sterile nail nipper and filed with dremel without incident  -DM shoes ordered and will schedule for fitting.  -Advised to keep betadine on the macerated areas in between toes.  -Answered all patient questions -Patient to return  in 3 months for at risk foot care -Patient advised to call the office if any problems or questions arise in the meantime.    Louann Sjogren, DPM

## 2023-03-13 ENCOUNTER — Encounter: Payer: Self-pay | Admitting: Family Medicine

## 2023-03-13 ENCOUNTER — Ambulatory Visit (INDEPENDENT_AMBULATORY_CARE_PROVIDER_SITE_OTHER): Payer: Medicare Other | Admitting: Family Medicine

## 2023-03-13 VITALS — BP 98/59 | HR 40 | Temp 98.2°F

## 2023-03-13 DIAGNOSIS — E611 Iron deficiency: Secondary | ICD-10-CM | POA: Diagnosis not present

## 2023-03-13 DIAGNOSIS — E1140 Type 2 diabetes mellitus with diabetic neuropathy, unspecified: Secondary | ICD-10-CM | POA: Diagnosis not present

## 2023-03-13 DIAGNOSIS — J4489 Other specified chronic obstructive pulmonary disease: Secondary | ICD-10-CM | POA: Diagnosis not present

## 2023-03-13 DIAGNOSIS — R790 Abnormal level of blood mineral: Secondary | ICD-10-CM

## 2023-03-13 DIAGNOSIS — Z7984 Long term (current) use of oral hypoglycemic drugs: Secondary | ICD-10-CM

## 2023-03-13 DIAGNOSIS — R059 Cough, unspecified: Secondary | ICD-10-CM | POA: Diagnosis not present

## 2023-03-13 DIAGNOSIS — J449 Chronic obstructive pulmonary disease, unspecified: Secondary | ICD-10-CM | POA: Diagnosis not present

## 2023-03-13 DIAGNOSIS — K591 Functional diarrhea: Secondary | ICD-10-CM

## 2023-03-13 DIAGNOSIS — R682 Dry mouth, unspecified: Secondary | ICD-10-CM

## 2023-03-13 DIAGNOSIS — Z720 Tobacco use: Secondary | ICD-10-CM

## 2023-03-13 LAB — POC COVID19 BINAXNOW: SARS Coronavirus 2 Ag: NEGATIVE

## 2023-03-13 MED ORDER — AMBULATORY NON FORMULARY MEDICATION
0 refills | Status: DC
Start: 2023-03-13 — End: 2023-08-02

## 2023-03-13 MED ORDER — DULOXETINE HCL 30 MG PO CPEP: 60.0000 mg | ORAL_CAPSULE | Freq: Every day | ORAL | 1 refills | Status: DC

## 2023-03-13 MED ORDER — TRELEGY ELLIPTA 100-62.5-25 MCG/ACT IN AEPB
1.0000 | INHALATION_SPRAY | Freq: Every day | RESPIRATORY_TRACT | 1 refills | Status: DC
Start: 2023-03-13 — End: 2024-01-17

## 2023-03-13 MED ORDER — DIPHENOXYLATE-ATROPINE 2.5-0.025 MG PO TABS
ORAL_TABLET | ORAL | 1 refills | Status: DC
Start: 2023-03-13 — End: 2023-07-09

## 2023-03-13 MED ORDER — AMBULATORY NON FORMULARY MEDICATION
99 refills | Status: AC
Start: 2023-03-13 — End: ?

## 2023-03-13 NOTE — Assessment & Plan Note (Signed)
Get updated A1c ordered today.  The last time it was elevated from chronic steroid use.  We have actually been doing better and has not she has not needed it as many rounds of prednisone recently again after removing some of the more sedating medications some hopeful that the A1c actually looks better.

## 2023-03-13 NOTE — Progress Notes (Signed)
Acute Office Visit  Subjective:     Patient ID: [name], female    DOB: 12/30/1945, 77 y.o.   MRN: [id number]  Chief Complaint  Patient presents with   Cough   Nasal Congestion    HPI Patient is in today for Cough.  The infusion nurse came to the house to give her her IVIG and noticed that she was wheezing more than usual.  She reports that her cough is chronic she does not feel like it is necessarily worse.  We have trying to get her a new nebulizer for couple months at this point we have sent orders twice but she has not been able to get 1.  She does have the medications for the machine.  She has not been wearing her oxygen therapy as her pulse ox has remained in the mid to upper 90s without it.  That she did put it on a couple of days ago just to see if it would help break up the sputum and mucus but it really did not.  That is her biggest issue is that she has chronic mucus production and has difficulty clearing her airway.  She continues to smoke.  Also reports that her diuretic does not seem to be working as well as it was previously.  She does not urinate much when she takes it.  She probably drinks about 80 ounces of fluid or more total daily.  Diabetes - no hypoglycemic events. No wounds or sores that are not healing well. No increased thirst or urination. Checking glucose at home. Taking medications as prescribed without any side effects.   ROS      Objective:    BP (!) 98/59   Pulse (!) 40   Temp 98.2 F (36.8 C)   SpO2 93%    Physical Exam Vitals and nursing note reviewed.  Constitutional:      Appearance: She is well-developed.  HENT:     Head: Normocephalic and atraumatic.  Cardiovascular:     Rate and Rhythm: Normal rate and regular rhythm.     Heart sounds: Normal heart sounds.  Pulmonary:     Effort: Pulmonary effort is normal.     Breath sounds: Normal breath sounds.  Musculoskeletal:     Comments: 1+ lower extremity edema bilaterally up to  her mid tibia.  Skin:    General: Skin is warm and dry.  Neurological:     Mental Status: She is alert and oriented to person, place, and time.  Psychiatric:        Behavior: Behavior normal.     Results for orders placed or performed in visit on 03/13/23  POC COVID-19  Result Value Ref Range   SARS Coronavirus 2 Ag Negative Negative        Assessment & Plan:   Problem List Items Addressed This Visit       Respiratory   COPD, group D, by GOLD 2017 classification (HCC)   Relevant Medications   Fluticasone-Umeclidin-Vilant (TRELEGY ELLIPTA) 100-62.5-25 MCG/ACT AEPB   COPD (chronic obstructive pulmonary disease) with chronic bronchitis - Primary    She has been doing great without her oxygen ever since we removed some of her more sedating medications I think we have a much better balance now.  Will try to get the nebulizer machine to her I think that will make a big difference with her being able to clear sputum from her chest I do not see any sign of active infection today  so we can hold off on any treatment if she feels like something is changing or getting worse then please let us know.  Musso can work on try to get her a flutter valve I think that could also be helpful in helping her move mucus.      Relevant Medications   AMBULATORY NON FORMULARY MEDICATION   AMBULATORY NON FORMULARY MEDICATION   Fluticasone-Umeclidin-Vilant (TRELEGY ELLIPTA) 100-62.5-25 MCG/ACT AEPB   Other Relevant Orders   CMP14+EGFR   CBC w/Diff/Platelet   HgB A1c   Magnesium     Endocrine   Diabetes mellitus with neuropathy (HCC)    Get updated A1c ordered today.  The last time it was elevated from chronic steroid use.  We have actually been doing better and has not she has not needed it as many rounds of prednisone recently again after removing some of the more sedating medications some hopeful that the A1c actually looks better.      Relevant Orders   CMP14+EGFR   CBC w/Diff/Platelet   HgB  A1c   Magnesium   Other Visit Diagnoses     Low magnesium level       Relevant Orders   CMP14+EGFR   CBC w/Diff/Platelet   HgB A1c   Magnesium   Cough, unspecified type       Relevant Orders   POC COVID-19 (Completed)   Tobacco abuse       Dry mouth       Functional diarrhea       Relevant Medications   diphenoxylate-atropine (LOMOTIL) 2.5-0.025 MG tablet       Cough is chronic - no sig of acute infection. Will try to get nebulizer deliver this week.  Still smoking.  Will work on getting a flutter valve.    She has had low magnesium on her last check.  Will recheck again today.  Dry Mouth-particularly in the morning she wakes up and feels like her gums and mouth are stuck to her teeth.  We discussed that she is on several medications that can definitely cause mouth dryness.  She would like a refill on the Lomotil today last filled about a year ago for 60 tabs with 1 refill.  So that has lasted a year.  Meds ordered this encounter  Medications   AMBULATORY NON FORMULARY MEDICATION    Sig: Medication Name: Nebulizer for severe COPD.   Please deliver this week.    Dispense:  1 Units    Refill:  0   AMBULATORY NON FORMULARY MEDICATION    Sig: Medication Name: Flutter valve for COPD    Dispense:  1 Units    Refill:  PRN   diphenoxylate-atropine (LOMOTIL) 2.5-0.025 MG tablet    Sig: TAKE ONE OR TWO TABLETS BY MOUTH TWICE DAILY AS NEEDED FOR DIARRHEA OR LOOSE STOOLS    Dispense:  30 tablet    Refill:  1   DULoxetine (CYMBALTA) 30 MG capsule    Sig: Take 2 capsules (60 mg total) by mouth daily.    Dispense:  180 capsule    Refill:  1   Fluticasone-Umeclidin-Vilant (TRELEGY ELLIPTA) 100-62.5-25 MCG/ACT AEPB    Sig: Inhale 1 puff into the lungs daily.    Dispense:  180 each    Refill:  1    Return in about 3 months (around 06/13/2023).  Nani Gasser, MD  I spent 40 minutes on the day of the encounter to include pre-visit record review, face-to-face time with the  patient and  post visit ordering of test.

## 2023-03-13 NOTE — Assessment & Plan Note (Signed)
She has been doing great without her oxygen ever since we removed some of her more sedating medications I think we have a much better balance now.  Will try to get the nebulizer machine to her I think that will make a big difference with her being able to clear sputum from her chest I do not see any sign of active infection today so we can hold off on any treatment if she feels like something is changing or getting worse then please let us know.  Musso can work on try to get her a flutter valve I think that could also be helpful in helping her move mucus.

## 2023-03-13 NOTE — Patient Instructions (Addendum)
We will work on getting you a flutter valve.  If you don't get a call about your Nebulizer by Thursday please let me know.  Please limit your fluids to between 50-60 oz total for the day, colluding your coffee intake.  You still feel like you are holding onto a lot of fluid even after adjusting your intake then please let me know.

## 2023-03-14 ENCOUNTER — Other Ambulatory Visit: Payer: Self-pay | Admitting: Family Medicine

## 2023-03-14 ENCOUNTER — Telehealth: Payer: Self-pay | Admitting: *Deleted

## 2023-03-14 DIAGNOSIS — J4489 Other specified chronic obstructive pulmonary disease: Secondary | ICD-10-CM

## 2023-03-14 MED ORDER — AMBULATORY NON FORMULARY MEDICATION: 0 refills | Status: DC

## 2023-03-14 NOTE — Progress Notes (Signed)
Anime, kidney function was up a little bit at 1.2.  Normally you are around 1.0.  It is fine we will keep an eye on it.  I still think you might have a little extra volume on your legs.  So definitely keeping the fluids to no more than 60 ounces total but somewhere between 50 and 60 ounces daily.  Your CO2 was actually little high sometimes this can happen when you are not oxygenating well.  Do you sleep with your oxygen at night?  You may actually needed at night.  We could always consider doing an overnight sleep test to see if maybe you drop in your sleep even though I know during the daytime you have actually been doing really well.  Hemoglobin is a little low at 11.  The last one was around 12.  Have you noticed any blood in the urine or stool?  Your magnesium level looks great.  Call lab and see if we can add an iron panel.

## 2023-03-14 NOTE — Progress Notes (Signed)
No orders of the defined types were placed in this encounter.   Meds ordered this encounter  Medications   AMBULATORY NON FORMULARY MEDICATION    Sig: Medication Name: overnight pulse ox.  Dx. Severe COPD    Dispense:  1 Units    Refill:  0

## 2023-03-14 NOTE — Progress Notes (Signed)
Lower intake can lower BP but so can having to take so much lasix. If dec intake then we should be able to cut back a little on the lasix and that would help her BP. Can also skip lasix the day of her infusion to help with her BP.  Is she ok iwht over night oxygen test.  Has she gotten a call about nebulizer machine yet?

## 2023-03-14 NOTE — Telephone Encounter (Signed)
Order for nebulizer and flutter valve faxed to [facility]

## 2023-03-14 NOTE — Progress Notes (Signed)
Order overnight oxygen. Can we check on nebulizer referral

## 2023-03-15 NOTE — Progress Notes (Signed)
Please call the lab and add a ferritin level.  Also was really hoping they can get the nebulizer out by Friday.

## 2023-03-16 NOTE — Progress Notes (Signed)
Confirmed iron stores are low.  Are you taking any iron?  How is your blood pressure and swelling doing?

## 2023-03-18 ENCOUNTER — Other Ambulatory Visit: Payer: Self-pay | Admitting: Family Medicine

## 2023-03-27 ENCOUNTER — Ambulatory Visit: Payer: Medicare Other | Admitting: Family Medicine

## 2023-04-01 ENCOUNTER — Other Ambulatory Visit: Payer: Self-pay | Admitting: Family Medicine

## 2023-04-01 DIAGNOSIS — R42 Dizziness and giddiness: Secondary | ICD-10-CM

## 2023-04-20 ENCOUNTER — Other Ambulatory Visit: Payer: Self-pay | Admitting: Family Medicine

## 2023-04-24 ENCOUNTER — Other Ambulatory Visit: Payer: Self-pay | Admitting: Family Medicine

## 2023-04-25 ENCOUNTER — Other Ambulatory Visit: Payer: Self-pay | Admitting: Physician Assistant

## 2023-04-30 NOTE — Telephone Encounter (Signed)
Medst sent. Pleaes call pt about device.  I have no idea?

## 2023-04-30 NOTE — Telephone Encounter (Signed)
Pt called. She has questions about when to turn off and on sleep study wrist device.

## 2023-05-02 NOTE — Telephone Encounter (Signed)
Patient states that she goes to sleep at 9pm and will sleep until midnight - she is awake from midnight to about 5 am- then sleeps again for a few hours. She wants to know if she should press the awake button whild she is awake at midnight and then press sleep button again at 5 am . I suggested she contacted the people who supplied the sleep study device but she states that they did not leave a number for her to call.   Called [facility]- representative states patient should press asleep at 9pm and when awakens at midnight press awake, then when going to sleep again at 5am press asleep and then when awakens a few hours later press awake.   Patient informed as above and voiced understanding.

## 2023-05-21 ENCOUNTER — Other Ambulatory Visit: Payer: Self-pay | Admitting: Family Medicine

## 2023-05-21 DIAGNOSIS — K591 Functional diarrhea: Secondary | ICD-10-CM

## 2023-06-02 ENCOUNTER — Other Ambulatory Visit: Payer: Self-pay | Admitting: Family Medicine

## 2023-06-07 ENCOUNTER — Ambulatory Visit (INDEPENDENT_AMBULATORY_CARE_PROVIDER_SITE_OTHER): Payer: Medicare Other | Admitting: Podiatry

## 2023-06-07 ENCOUNTER — Other Ambulatory Visit: Payer: Self-pay | Admitting: Family Medicine

## 2023-06-07 ENCOUNTER — Encounter: Payer: Self-pay | Admitting: Podiatry

## 2023-06-07 DIAGNOSIS — E1142 Type 2 diabetes mellitus with diabetic polyneuropathy: Secondary | ICD-10-CM

## 2023-06-07 DIAGNOSIS — M79675 Pain in left toe(s): Secondary | ICD-10-CM

## 2023-06-07 DIAGNOSIS — B351 Tinea unguium: Secondary | ICD-10-CM | POA: Diagnosis not present

## 2023-06-07 DIAGNOSIS — M79674 Pain in right toe(s): Secondary | ICD-10-CM | POA: Diagnosis not present

## 2023-06-07 NOTE — Progress Notes (Signed)
Subjective:  Patient ID: [name], female    DOB: November 24, 1945,   MRN: [id number]  Chief Complaint  Patient presents with   RFC    RFC    77 y.o. female presents for concern of thickened elongated and painful nails that are difficult to trim. Requesting to have them trimmed today. Denies any burning or tingling in her feet. Patient is diabetic and last A1c was 6.7.  . Denies any other pedal complaints. Denies n/v/f/c.   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 CFT <3 seconds. Absent hair growth on digits. Edema noted to bilateral lower extremities. Xerosis noted bilaterally.  Skin. No lacerations or abrasions bilateral feet. Nails 1-5 bilateral  are thickened discolored and elongated with subungual debris. Maceration noted in 3rd and fourth interspaced bilateral.  Musculoskeletal: MMT 5/5 bilateral lower extremities in DF, PF, Inversion and Eversion. Deceased ROM in DF of ankle joint.  Neurological: Sensation intact to light touch. Protective sensation diminished  bilateral.    Assessment:   1. Pain due to onychomycosis of toenails of both feet   2. Type 2 diabetes mellitus with diabetic polyneuropathy, unspecified whether long term insulin use (HCC)        Plan:  Patient was evaluated and treated and all questions answered. -Discussed and educated patient on diabetic foot care, especially with  regards to the vascular, neurological and musculoskeletal systems.  -Stressed the importance of good glycemic control and the detriment of not  controlling glucose levels in relation to the foot. -Discussed supportive shoes at all times and checking feet regularly.  -Mechanically debrided all nails 1-5 bilateral using sterile nail nipper and filed with dremel without incident  -Awaiting diabetic shoes.  -Advised to keep betadine on the macerated areas in between toes.  -Answered all patient questions -Patient to return  in 3 months for at risk foot care -Patient advised to call the office if any problems or questions arise in the meantime.    Louann Sjogren, DPM

## 2023-06-08 ENCOUNTER — Other Ambulatory Visit: Payer: Self-pay | Admitting: Family Medicine

## 2023-06-08 DIAGNOSIS — E1140 Type 2 diabetes mellitus with diabetic neuropathy, unspecified: Secondary | ICD-10-CM

## 2023-06-14 ENCOUNTER — Ambulatory Visit: Payer: Medicare Other

## 2023-06-14 ENCOUNTER — Ambulatory Visit (INDEPENDENT_AMBULATORY_CARE_PROVIDER_SITE_OTHER): Payer: Medicare Other | Admitting: Family Medicine

## 2023-06-14 ENCOUNTER — Encounter: Payer: Self-pay | Admitting: Family Medicine

## 2023-06-14 VITALS — BP 99/64 | HR 64 | Ht 67.0 in | Wt 226.0 lb

## 2023-06-14 DIAGNOSIS — Z23 Encounter for immunization: Secondary | ICD-10-CM

## 2023-06-14 DIAGNOSIS — G6181 Chronic inflammatory demyelinating polyneuritis: Secondary | ICD-10-CM

## 2023-06-14 DIAGNOSIS — F17200 Nicotine dependence, unspecified, uncomplicated: Secondary | ICD-10-CM

## 2023-06-14 DIAGNOSIS — J984 Other disorders of lung: Secondary | ICD-10-CM | POA: Diagnosis not present

## 2023-06-14 DIAGNOSIS — E1140 Type 2 diabetes mellitus with diabetic neuropathy, unspecified: Secondary | ICD-10-CM

## 2023-06-14 DIAGNOSIS — R0602 Shortness of breath: Secondary | ICD-10-CM

## 2023-06-14 DIAGNOSIS — R059 Cough, unspecified: Secondary | ICD-10-CM | POA: Diagnosis not present

## 2023-06-14 DIAGNOSIS — I723 Aneurysm of iliac artery: Secondary | ICD-10-CM | POA: Diagnosis not present

## 2023-06-14 DIAGNOSIS — J4489 Other specified chronic obstructive pulmonary disease: Secondary | ICD-10-CM

## 2023-06-14 DIAGNOSIS — Z7984 Long term (current) use of oral hypoglycemic drugs: Secondary | ICD-10-CM | POA: Diagnosis not present

## 2023-06-14 DIAGNOSIS — R053 Chronic cough: Secondary | ICD-10-CM

## 2023-06-14 DIAGNOSIS — F331 Major depressive disorder, recurrent, moderate: Secondary | ICD-10-CM | POA: Diagnosis not present

## 2023-06-14 DIAGNOSIS — E861 Hypovolemia: Secondary | ICD-10-CM

## 2023-06-14 DIAGNOSIS — R918 Other nonspecific abnormal finding of lung field: Secondary | ICD-10-CM | POA: Diagnosis not present

## 2023-06-14 DIAGNOSIS — J189 Pneumonia, unspecified organism: Secondary | ICD-10-CM

## 2023-06-14 DIAGNOSIS — G4733 Obstructive sleep apnea (adult) (pediatric): Secondary | ICD-10-CM

## 2023-06-14 LAB — POCT GLYCOSYLATED HEMOGLOBIN (HGB A1C): Hemoglobin A1C: 5.8 % — AB (ref 4.0–5.6)

## 2023-06-14 MED ORDER — LANTUS SOLOSTAR 100 UNIT/ML ~~LOC~~ SOPN: 45.0000 [IU] | PEN_INJECTOR | Freq: Every day | SUBCUTANEOUS | 2 refills | Status: DC

## 2023-06-14 MED ORDER — AZITHROMYCIN 250 MG PO TABS: ORAL_TABLET | ORAL | 2 refills | Status: DC

## 2023-06-14 NOTE — Assessment & Plan Note (Signed)
A1c looks good she has had a few lows since I last saw her so we discussed decreasing her Lantus down to 45 units

## 2023-06-14 NOTE — Assessment & Plan Note (Signed)
Recently got up-to-date home sleep study which showed moderate obstructive sleep apnea with a few central apneas as well.  AHI was 21.  Snoring was mild.  Lowest oxygen level was 82 but average was 92.  Will order new CPAP

## 2023-06-14 NOTE — Progress Notes (Signed)
Established Patient Office Visit  Subjective   Patient ID: [name], female    DOB: 1946/01/14  Age: 77 y.o. MRN: [id number]  Chief Complaint  Patient presents with   Diabetes    HPI  Diabetes - no hypoglycemic events. No wounds or sores that are not healing well. No increased thirst or urination. Checking glucose at home. Taking medications as prescribed without any side effects.  F/U COPD -she just feels like ever since she had pneumonia in June she just has not been back to her baseline in regards to her lung.  She says she feels like she is constantly wheezing it never really goes away even after her nebulizer treatment she says sometimes she will feel like things are breaking up a little bit after the treatment but she never feels good.    ROS    Objective:     BP 99/64   Pulse 64   Ht 5\' 7"  (1.702 m)   Wt 226 lb (102.5 kg)   SpO2 94%   BMI 35.40 kg/m    Physical Exam Vitals and nursing note reviewed.  Constitutional:      Appearance: Normal appearance.  HENT:     Head: Normocephalic and atraumatic.  Eyes:     Conjunctiva/sclera: Conjunctivae normal.  Cardiovascular:     Rate and Rhythm: Normal rate and regular rhythm.  Pulmonary:     Effort: Pulmonary effort is normal.     Breath sounds: Wheezing and rhonchi present.  Skin:    General: Skin is warm and dry.  Neurological:     Mental Status: She is alert and oriented to person, place, and time.  Psychiatric:        Mood and Affect: Mood normal.        Behavior: Behavior normal.      Results for orders placed or performed in visit on 06/14/23  CBC with Differential/Platelet  Result Value Ref Range   WBC 5.3 3.4 - 10.8 x10E3/uL   RBC 4.31 3.77 - 5.28 x10E6/uL   Hemoglobin 13.4 11.1 - 15.9 g/dL   Hematocrit 86.5 78.4 - 46.6 %   MCV 96 79 - 97 fL   MCH 31.1 26.6 - 33.0 pg   MCHC 32.3 31.5 - 35.7 g/dL   RDW 69.6 29.5 - 28.4 %   Platelets 239 150 - 450 x10E3/uL   Neutrophils 68 Not Estab. %    Lymphs 20 Not Estab. %   Monocytes 9 Not Estab. %   Eos 1 Not Estab. %   Basos 1 Not Estab. %   Neutrophils Absolute 3.6 1.4 - 7.0 x10E3/uL   Lymphocytes Absolute 1.1 0.7 - 3.1 x10E3/uL   Monocytes Absolute 0.5 0.1 - 0.9 x10E3/uL   EOS (ABSOLUTE) 0.1 0.0 - 0.4 x10E3/uL   Basophils Absolute 0.1 0.0 - 0.2 x10E3/uL   Immature Granulocytes 1 Not Estab. %   Immature Grans (Abs) 0.0 0.0 - 0.1 x10E3/uL  CMP14+EGFR  Result Value Ref Range   Glucose 170 (H) 70 - 99 mg/dL   BUN 26 8 - 27 mg/dL   Creatinine, Ser 1.32 (H) 0.57 - 1.00 mg/dL   eGFR 45 (L) >44 WN/UUV/2.53   BUN/Creatinine Ratio 21 12 - 28   Sodium 137 134 - 144 mmol/L   Potassium 4.9 3.5 - 5.2 mmol/L   Chloride 92 (L) 96 - 106 mmol/L   CO2 28 20 - 29 mmol/L   Calcium 9.4 8.7 - 10.3 mg/dL   Total Protein 7.6  6.0 - 8.5 g/dL   Albumin 3.7 (L) 3.8 - 4.8 g/dL   Globulin, Total 3.9 1.5 - 4.5 g/dL   Bilirubin Total 0.8 0.0 - 1.2 mg/dL   Alkaline Phosphatase 64 44 - 121 IU/L   AST 13 0 - 40 IU/L   ALT 6 0 - 32 IU/L  POCT HgB A1C  Result Value Ref Range   Hemoglobin A1C 5.8 (A) 4.0 - 5.6 %   HbA1c POC (<> result, manual entry)     HbA1c, POC (prediabetic range)     HbA1c, POC (controlled diabetic range)        The ASCVD Risk score (Arnett DK, et al., 2019) failed to calculate for the following reasons:   The patient has a prior MI or stroke diagnosis    Assessment & Plan:   Problem List Items Addressed This Visit       Cardiovascular and Mediastinum   ANEURYSM, ILIAC ARTERY    Keep blood pressure well-controlled.        Respiratory   OSA (obstructive sleep apnea)    Recently got up-to-date home sleep study which showed moderate obstructive sleep apnea with a few central apneas as well.  AHI was 21.  Snoring was mild.  Lowest oxygen level was 82 but average was 92.  Will order new CPAP      Relevant Orders   For home use only DME continuous positive airway pressure (CPAP)   COPD (chronic obstructive  pulmonary disease) with chronic bronchitis (HCC)   Relevant Medications   azithromycin (ZITHROMAX) 250 MG tablet   Other Relevant Orders   DG Chest 2 View   CBC with Differential/Platelet (Completed)   CMP14+EGFR (Completed)     Endocrine   Diabetes mellitus with neuropathy (HCC) - Primary    A1c looks good she has had a few lows since I last saw her so we discussed decreasing her Lantus down to 45 units      Relevant Medications   insulin glargine (LANTUS SOLOSTAR) 100 UNIT/ML Solostar Pen   Other Relevant Orders   POCT HgB A1C (Completed)   DG Chest 2 View   CBC with Differential/Platelet (Completed)   CMP14+EGFR (Completed)     Nervous and Auditory   CIDP (chronic inflammatory demyelinating polyneuropathy) (HCC)    Follows with neurology.  Currently on CellCept.  Also on Lyrica.        Other   TOBACCO DEPENDENCE    She continue to smoke      Major depressive disorder, recurrent episode (HCC)    Stable on her Cymbalta.      Other Visit Diagnoses     Encounter for immunization       Relevant Orders   Flu Vaccine Trivalent High Dose (Fluad) (Completed)   Chronic cough       Relevant Orders   DG Chest 2 View   CBC with Differential/Platelet (Completed)   CMP14+EGFR (Completed)   SOB (shortness of breath)       Relevant Orders   DG Chest 2 View   CBC with Differential/Platelet (Completed)   CMP14+EGFR (Completed)   EKG 12-Lead   Hypotension due to hypovolemia          Chronic cough we did go ahead and recommend a chest x-ray today she does get frequent exacerbations so we discussed maybe starting azithromycin prophylactically as well.  Prescription sent to pharmacy but also want a get a baseline chest x-ray to make sure that we do not need to  treat her more acutely.  EKG today shows normal sinus rhythm, 62 bpm with low voltage QRS.  Pressure was quite low today so we had a long discussion about adjusting her diuretic and staying hydrated.  Return in about  3 months (around 09/14/2023) for DM.    Nani Gasser, MD

## 2023-06-14 NOTE — Assessment & Plan Note (Signed)
She continue to smoke

## 2023-06-15 LAB — CBC WITH DIFFERENTIAL/PLATELET
Basophils Absolute: 0.1 10*3/uL (ref 0.0–0.2)
Basos: 1 %
EOS (ABSOLUTE): 0.1 10*3/uL (ref 0.0–0.4)
Eos: 1 %
Hematocrit: 41.5 % (ref 34.0–46.6)
Hemoglobin: 13.4 g/dL (ref 11.1–15.9)
Immature Grans (Abs): 0 10*3/uL (ref 0.0–0.1)
Immature Granulocytes: 1 %
Lymphocytes Absolute: 1.1 10*3/uL (ref 0.7–3.1)
Lymphs: 20 %
MCH: 31.1 pg (ref 26.6–33.0)
MCHC: 32.3 g/dL (ref 31.5–35.7)
MCV: 96 fL (ref 79–97)
Monocytes Absolute: 0.5 10*3/uL (ref 0.1–0.9)
Monocytes: 9 %
Neutrophils Absolute: 3.6 10*3/uL (ref 1.4–7.0)
Neutrophils: 68 %
Platelets: 239 10*3/uL (ref 150–450)
RBC: 4.31 x10E6/uL (ref 3.77–5.28)
RDW: 14.5 % (ref 11.7–15.4)
WBC: 5.3 10*3/uL (ref 3.4–10.8)

## 2023-06-15 LAB — CMP14+EGFR
ALT: 6 [IU]/L (ref 0–32)
AST: 13 [IU]/L (ref 0–40)
Albumin: 3.7 g/dL — ABNORMAL LOW (ref 3.8–4.8)
Alkaline Phosphatase: 64 [IU]/L (ref 44–121)
BUN/Creatinine Ratio: 21 (ref 12–28)
BUN: 26 mg/dL (ref 8–27)
Bilirubin Total: 0.8 mg/dL (ref 0.0–1.2)
CO2: 28 mmol/L (ref 20–29)
Calcium: 9.4 mg/dL (ref 8.7–10.3)
Chloride: 92 mmol/L — ABNORMAL LOW (ref 96–106)
Creatinine, Ser: 1.22 mg/dL — ABNORMAL HIGH (ref 0.57–1.00)
Globulin, Total: 3.9 g/dL (ref 1.5–4.5)
Glucose: 170 mg/dL — ABNORMAL HIGH (ref 70–99)
Potassium: 4.9 mmol/L (ref 3.5–5.2)
Sodium: 137 mmol/L (ref 134–144)
Total Protein: 7.6 g/dL (ref 6.0–8.5)
eGFR: 45 mL/min/{1.73_m2} — ABNORMAL LOW (ref >59)

## 2023-06-15 NOTE — Progress Notes (Signed)
Hi Ms. Raudenbush, kidney function is around 1.2 so similar to what it was 3 months ago.  I was hoping it would look a little bit better.  You still look dry on the blood work so just really encouraged her to increase your water intake.  Blood count looks good.  Trying to decrease your Lasix originally would like for you to try taking 1 a day.  If you really do not notice a big difference in your swelling then stay at that 1 a day if possible.  If occasionally need to take an extra than that still perfectly fine.

## 2023-06-18 ENCOUNTER — Encounter: Payer: Self-pay | Admitting: Family Medicine

## 2023-06-18 NOTE — Assessment & Plan Note (Signed)
Follows with neurology.  Currently on CellCept.  Also on Lyrica.

## 2023-06-18 NOTE — Assessment & Plan Note (Signed)
Keep blood pressure well-controlled.

## 2023-06-18 NOTE — Assessment & Plan Note (Signed)
Stable on her Cymbalta.

## 2023-06-19 ENCOUNTER — Telehealth: Payer: Self-pay | Admitting: Family Medicine

## 2023-06-19 DIAGNOSIS — G6181 Chronic inflammatory demyelinating polyneuritis: Secondary | ICD-10-CM | POA: Diagnosis not present

## 2023-06-19 DIAGNOSIS — G5603 Carpal tunnel syndrome, bilateral upper limbs: Secondary | ICD-10-CM | POA: Diagnosis not present

## 2023-06-19 DIAGNOSIS — G43709 Chronic migraine without aura, not intractable, without status migrainosus: Secondary | ICD-10-CM | POA: Diagnosis not present

## 2023-06-19 DIAGNOSIS — G894 Chronic pain syndrome: Secondary | ICD-10-CM | POA: Diagnosis not present

## 2023-06-19 NOTE — Telephone Encounter (Signed)
Patient's daughter called and asked when test results come in please contact her because the patient is worried she won't remember her phone number [phone]

## 2023-06-20 ENCOUNTER — Other Ambulatory Visit: Payer: Self-pay | Admitting: Family Medicine

## 2023-06-20 DIAGNOSIS — B372 Candidiasis of skin and nail: Secondary | ICD-10-CM

## 2023-06-20 DIAGNOSIS — R21 Rash and other nonspecific skin eruption: Secondary | ICD-10-CM

## 2023-06-22 ENCOUNTER — Other Ambulatory Visit: Payer: Self-pay | Admitting: Family Medicine

## 2023-06-29 ENCOUNTER — Telehealth: Payer: Self-pay | Admitting: Family Medicine

## 2023-06-29 NOTE — Telephone Encounter (Signed)
Copied from CRM [id number]. Topic: Clinical - Medication Refill >> Jun 29, 2023  2:05 PM Donita Brooks wrote: Most Recent Primary Care Visit:  Provider: Nani Gasser D  Department: [facility]  Visit Type: OFFICE VISIT  Date: 06/14/2023  Medication:  furosemide (LASIX) 40 MG tablet  Has the patient contacted their pharmacy? No (Agent: If no, request that the patient contact the pharmacy for the refill. If patient does not wish to contact the pharmacy document the reason why and proceed with request.) (Agent: If yes, when and what did the pharmacy advise?)  Is this the correct pharmacy for this prescription? Yes If no, delete pharmacy and type the correct one.  This is the patient's preferred pharmacy:  [facility] [place], [place] - [address] [place] [address] [place] [address] Phone: [phone] Fax: [phone]   Has the prescription been filled recently? No  Is the patient out of the medication? Yes  Has the patient been seen for an appointment in the last year OR does the patient have an upcoming appointment? No  Can we respond through MyChart? Yes  Agent: Please be advised that Rx refills may take up to 3 business days. We ask that you follow-up with your pharmacy.

## 2023-07-03 NOTE — Progress Notes (Signed)
The CXR finally came in and showed possible Pneumonia.  Does she feel better after the Zapck antibiotic? Please notify her daughter as well

## 2023-07-06 ENCOUNTER — Other Ambulatory Visit: Payer: Self-pay | Admitting: Family Medicine

## 2023-07-06 DIAGNOSIS — K591 Functional diarrhea: Secondary | ICD-10-CM

## 2023-07-09 ENCOUNTER — Other Ambulatory Visit: Payer: Self-pay | Admitting: Family Medicine

## 2023-07-10 ENCOUNTER — Other Ambulatory Visit: Payer: Self-pay | Admitting: Physician Assistant

## 2023-07-10 ENCOUNTER — Telehealth: Payer: Self-pay

## 2023-07-10 DIAGNOSIS — B370 Candidal stomatitis: Secondary | ICD-10-CM

## 2023-07-10 NOTE — Telephone Encounter (Signed)
Copied from CRM [id number]. Topic: Clinical - Medication Question >> Jul 09, 2023 11:40 AM Dimitri Ped wrote: Reason for CRM:   patint is calling to refill meds . I spoke with clinic and the 2 meds have already been sent over to be filled . Patient is requesting a med refill for a mouthwash for thrush I believe she is saying . Didn't see meds on list . So couldn't send over for a refill . Please reach out to patient concerning this refill and patient does not no the name of the mouthwash [phone] Ms Hailo

## 2023-07-11 NOTE — Telephone Encounter (Signed)
Mouthwash was sent 07/10/2023

## 2023-07-12 ENCOUNTER — Other Ambulatory Visit: Payer: Self-pay | Admitting: Family Medicine

## 2023-07-12 DIAGNOSIS — R42 Dizziness and giddiness: Secondary | ICD-10-CM

## 2023-07-12 MED ORDER — AMOXICILLIN-POT CLAVULANATE 875-125 MG PO TABS: 1.0000 | ORAL_TABLET | Freq: Two times a day (BID) | ORAL | 0 refills | Status: DC

## 2023-07-12 MED ORDER — PREDNISONE 20 MG PO TABS
40.0000 mg | ORAL_TABLET | Freq: Every day | ORAL | 0 refills | Status: DC
Start: 2023-07-12 — End: 2023-07-20

## 2023-07-12 NOTE — Progress Notes (Signed)
Will send over her antibiotic as well as prednisone.  She is not better into next week then she will need to make an appointment.

## 2023-07-19 ENCOUNTER — Telehealth: Payer: Self-pay

## 2023-07-19 NOTE — Telephone Encounter (Signed)
Copied from CRM [id number]. Topic: Clinical - Medical Advice >> Jul 18, 2023  1:21 PM Tiffany H wrote: Reason for CRM: Condition update:   Since being prescribed recent medications, patient lost her voice Saturday due to laryngitis. Still has a bad cough but feels a "little" bit better. She has no fever, processing meds fine. Occasional throat soreness. Please contact daughter Sedalia Muta with any recommendations as to how to make her more comfortable. Please assist.

## 2023-07-20 ENCOUNTER — Telehealth: Payer: Self-pay

## 2023-07-20 MED ORDER — PREDNISONE 20 MG PO TABS: 40.0000 mg | ORAL_TABLET | Freq: Every day | ORAL | 0 refills | Status: DC

## 2023-07-20 NOTE — Telephone Encounter (Signed)
Sent in 2nd round of prednisone  Meds ordered this encounter  Medications   predniSONE (DELTASONE) 20 MG tablet    Sig: Take 2 tablets (40 mg total) by mouth daily with breakfast.    Dispense:  10 tablet    Refill:  0

## 2023-07-20 NOTE — Telephone Encounter (Signed)
The original message was yesterday lets give her a call today and see if she is continuing to feel better before the weekend or if we need to do something different.  There is really not a lot I can do for the voice but if the cough is getting worse I definitely want to know.  Salt water gargles lots of hydration.

## 2023-07-20 NOTE — Telephone Encounter (Unsigned)
 Copied from CRM [id number]. Topic: Clinical - Medical Advice >> Jul 18, 2023  1:21 PM Tiffany H wrote: Reason for CRM: Condition update:   Since being prescribed recent medications, patient lost her voice Saturday due to laryngitis. Still has a bad cough but feels a "little" bit better. She has no fever, processing meds fine. Occasional throat soreness. Please contact daughter Sedalia Muta with any recommendations as to how to make her more comfortable. Please assist.

## 2023-07-20 NOTE — Telephone Encounter (Signed)
Spoke to patient daughter Graciella Belton,  She states that she is very very slowly improving She states that the cough is bad and keeping pt up at night- the cough is about the same as previous . Daughter was questioning if steroid of different abx would be beneficial?

## 2023-07-20 NOTE — Telephone Encounter (Signed)
Patient daughter informed.

## 2023-07-23 ENCOUNTER — Other Ambulatory Visit: Payer: Self-pay | Admitting: Family Medicine

## 2023-07-24 ENCOUNTER — Telehealth: Payer: Self-pay

## 2023-07-24 NOTE — Telephone Encounter (Signed)
Task completed on 07/10/2023. Patient's daughter was advised of the update.

## 2023-07-24 NOTE — Telephone Encounter (Signed)
Copied from CRM [id number]. Topic: Clinical - Medication Refill >> Jul 23, 2023 11:36 AM Larwance Sachs wrote: Most Recent Primary Care Visit:  Provider: Nani Gasser D  Department: [facility]ARE [facility]  Visit Type: OFFICE VISIT  Date: 06/14/2023  Medication: nystatin (MYCOSTATIN) 100000 UNIT/ML suspension  Has the patient contacted their pharmacy? No, no refills (Agent: If no, request that the patient contact the pharmacy for the refill. If patient does not wish to contact the pharmacy document the reason why and proceed with request.) (Agent: If yes, when and what did the pharmacy advise?)  Is this the correct pharmacy for this prescription? Yes If no, delete pharmacy and type the correct one.  This is the patient's preferred pharmacy:  [facility] [place], [place] - [address] [place] [address] [place] [address] Phone: [phone] Fax: [phone]   Has the prescription been filled recently? Yes  Is the patient out of the medication? Yes, finished this morning. Almost over thrush  Has the patient been seen for an appointment in the last year OR does the patient have an upcoming appointment? Yes  Can we respond through MyChart? Yes  Agent: Please be advised that Rx refills may take up to 3 business days. We ask that you follow-up with your pharmacy.

## 2023-07-25 ENCOUNTER — Other Ambulatory Visit: Payer: Self-pay | Admitting: Physician Assistant

## 2023-07-25 DIAGNOSIS — J4489 Other specified chronic obstructive pulmonary disease: Secondary | ICD-10-CM

## 2023-07-25 DIAGNOSIS — J441 Chronic obstructive pulmonary disease with (acute) exacerbation: Secondary | ICD-10-CM

## 2023-07-25 DIAGNOSIS — R0989 Other specified symptoms and signs involving the circulatory and respiratory systems: Secondary | ICD-10-CM

## 2023-07-25 NOTE — Telephone Encounter (Signed)
Copied from CRM [id number]. Topic: Clinical - Medication Refill >> Jul 25, 2023  9:41 AM Desma Mcgregor wrote: Most Recent Primary Care Visit:  Provider: Nani Gasser D  Department: [facility]  Visit Type: OFFICE VISIT  Date: 06/14/2023  Medication: Patient referring to it as swish and swallow mouthwash  Has the patient contacted their pharmacy? No (Agent: If no, request that the patient contact the pharmacy for the refill. If patient does not wish to contact the pharmacy document the reason why and proceed with request.) (Agent: If yes, when and what did the pharmacy advise?) Pt unable to see the name of the med or remember.  Is this the correct pharmacy for this prescription? Yes If no, delete pharmacy and type the correct one.  This is the patient's preferred pharmacy:  [facility] [place], [place] - [address] [place] [address] [place] [address] Phone: [phone] Fax: [phone]   Has the prescription been filled recently? Yes  Is the patient out of the medication? Yes  Has the patient been seen for an appointment in the last year OR does the patient have an upcoming appointment? Yes  Can we respond through MyChart? No  Agent: Please be advised that Rx refills may take up to 3 business days. We ask that you follow-up with your pharmacy.

## 2023-07-26 MED ORDER — NYSTATIN 100000 UNIT/ML MT SUSP: 5.0000 mL | Freq: Three times a day (TID) | OROMUCOSAL | 1 refills | Status: DC | PRN

## 2023-07-26 NOTE — Telephone Encounter (Signed)
Can you clarify with pt if thrush? It looks like [facility] sent in Nystatin swish and swallo on 12/3.  Is she wanting magic mouthwash?

## 2023-07-26 NOTE — Telephone Encounter (Signed)
Meds ordered this encounter  Medications   ipratropium-albuterol (DUONEB) 0.5-2.5 (3) MG/3ML SOLN    Sig: Take 3 mLs by nebulization every 4 (four) hours as needed.    Dispense:  360 mL    Refill:  0   magic mouthwash (nystatin, lidocaine, diphenhydrAMINE, alum & mag hydroxide) suspension    Sig: Swish and spit 5 mLs 3 (three) times daily as needed for mouth pain.    Dispense:  180 mL    Refill:  1

## 2023-07-30 ENCOUNTER — Telehealth: Payer: Self-pay | Admitting: Family Medicine

## 2023-07-30 NOTE — Telephone Encounter (Signed)
Spoke to the patient about her current symptoms. Per patient, she has a sore throat, congestion, and it hurts to swallow. Patient was offered an appointment today with [facility] at 340 pm for an evaluation. However patient stated she was too sick to come in. Patient did not have the capability to do a virtual visit. I asked the patient if she would like me to contact her daughter, but she said that her daughter was busy at work and she did not her to be disturbed. Patient was notified that if symptoms should worsen or change to go directly to the ER/UC. Patient was agreeable with plan.

## 2023-07-30 NOTE — Telephone Encounter (Signed)
Copied from CRM [id number]. Topic: Clinical - Medical Advice >> Jul 30, 2023  1:50 PM Ivette P wrote: Reason for CRM: Patient called in saying has a very soar throat and hurts to swallow. Wants to know if there is any medication that can be prescribed to get through the holidays.

## 2023-08-02 ENCOUNTER — Telehealth: Payer: Self-pay

## 2023-08-02 ENCOUNTER — Other Ambulatory Visit: Payer: Self-pay | Admitting: Physician Assistant

## 2023-08-02 DIAGNOSIS — J4489 Other specified chronic obstructive pulmonary disease: Secondary | ICD-10-CM

## 2023-08-02 DIAGNOSIS — B370 Candidal stomatitis: Secondary | ICD-10-CM

## 2023-08-02 MED ORDER — AMBULATORY NON FORMULARY MEDICATION: 0 refills | Status: AC

## 2023-08-02 NOTE — Telephone Encounter (Signed)
Copied from CRM [id number]. Topic: General - Other >> Aug 02, 2023 11:35 AM Fonda Kinder J wrote: Reason for CRM: Pt states her nebulizer has broken and she called the company but they told her to contact her PCP for a replacement. Pt wants to know if someone can send in a new order for DME Nebulizer machine

## 2023-08-02 NOTE — Addendum Note (Signed)
Addended by: Nani Gasser D on: 08/02/2023 01:15 PM   Modules accepted: Orders

## 2023-08-02 NOTE — Telephone Encounter (Signed)
Rx printed and signed. Placed in [place] [facility] [employer].  Let pt know when done

## 2023-08-08 DIAGNOSIS — R0602 Shortness of breath: Secondary | ICD-10-CM | POA: Diagnosis not present

## 2023-08-08 DIAGNOSIS — Z7982 Long term (current) use of aspirin: Secondary | ICD-10-CM | POA: Diagnosis not present

## 2023-08-08 DIAGNOSIS — Z794 Long term (current) use of insulin: Secondary | ICD-10-CM | POA: Diagnosis not present

## 2023-08-08 DIAGNOSIS — E1122 Type 2 diabetes mellitus with diabetic chronic kidney disease: Secondary | ICD-10-CM | POA: Diagnosis not present

## 2023-08-08 DIAGNOSIS — Z9981 Dependence on supplemental oxygen: Secondary | ICD-10-CM | POA: Diagnosis not present

## 2023-08-08 DIAGNOSIS — Z7983 Long term (current) use of bisphosphonates: Secondary | ICD-10-CM | POA: Diagnosis not present

## 2023-08-08 DIAGNOSIS — R0682 Tachypnea, not elsewhere classified: Secondary | ICD-10-CM | POA: Diagnosis not present

## 2023-08-08 DIAGNOSIS — I517 Cardiomegaly: Secondary | ICD-10-CM | POA: Diagnosis not present

## 2023-08-08 DIAGNOSIS — I083 Combined rheumatic disorders of mitral, aortic and tricuspid valves: Secondary | ICD-10-CM | POA: Diagnosis not present

## 2023-08-08 DIAGNOSIS — Z79890 Hormone replacement therapy (postmenopausal): Secondary | ICD-10-CM | POA: Diagnosis not present

## 2023-08-08 DIAGNOSIS — J9811 Atelectasis: Secondary | ICD-10-CM | POA: Diagnosis not present

## 2023-08-08 DIAGNOSIS — R918 Other nonspecific abnormal finding of lung field: Secondary | ICD-10-CM | POA: Diagnosis not present

## 2023-08-08 DIAGNOSIS — Z9049 Acquired absence of other specified parts of digestive tract: Secondary | ICD-10-CM | POA: Diagnosis not present

## 2023-08-08 DIAGNOSIS — R0603 Acute respiratory distress: Secondary | ICD-10-CM | POA: Diagnosis not present

## 2023-08-08 DIAGNOSIS — Z888 Allergy status to other drugs, medicaments and biological substances status: Secondary | ICD-10-CM | POA: Diagnosis not present

## 2023-08-08 DIAGNOSIS — Z1152 Encounter for screening for COVID-19: Secondary | ICD-10-CM | POA: Diagnosis not present

## 2023-08-08 DIAGNOSIS — Z87891 Personal history of nicotine dependence: Secondary | ICD-10-CM | POA: Diagnosis not present

## 2023-08-08 DIAGNOSIS — Z79899 Other long term (current) drug therapy: Secondary | ICD-10-CM | POA: Diagnosis not present

## 2023-08-08 DIAGNOSIS — Z7984 Long term (current) use of oral hypoglycemic drugs: Secondary | ICD-10-CM | POA: Diagnosis not present

## 2023-08-08 DIAGNOSIS — I10 Essential (primary) hypertension: Secondary | ICD-10-CM | POA: Diagnosis not present

## 2023-08-08 DIAGNOSIS — G6181 Chronic inflammatory demyelinating polyneuritis: Secondary | ICD-10-CM | POA: Diagnosis not present

## 2023-08-08 DIAGNOSIS — E039 Hypothyroidism, unspecified: Secondary | ICD-10-CM | POA: Diagnosis not present

## 2023-08-08 DIAGNOSIS — J9622 Acute and chronic respiratory failure with hypercapnia: Secondary | ICD-10-CM | POA: Diagnosis not present

## 2023-08-08 DIAGNOSIS — R0902 Hypoxemia: Secondary | ICD-10-CM | POA: Diagnosis not present

## 2023-08-08 DIAGNOSIS — Z9660 Presence of unspecified orthopedic joint implant: Secondary | ICD-10-CM | POA: Diagnosis not present

## 2023-08-08 DIAGNOSIS — E662 Morbid (severe) obesity with alveolar hypoventilation: Secondary | ICD-10-CM | POA: Diagnosis not present

## 2023-08-08 DIAGNOSIS — E871 Hypo-osmolality and hyponatremia: Secondary | ICD-10-CM | POA: Diagnosis not present

## 2023-08-08 DIAGNOSIS — J9621 Acute and chronic respiratory failure with hypoxia: Secondary | ICD-10-CM | POA: Diagnosis not present

## 2023-08-08 DIAGNOSIS — J441 Chronic obstructive pulmonary disease with (acute) exacerbation: Secondary | ICD-10-CM | POA: Diagnosis not present

## 2023-08-08 DIAGNOSIS — R0689 Other abnormalities of breathing: Secondary | ICD-10-CM | POA: Diagnosis not present

## 2023-08-08 DIAGNOSIS — I130 Hypertensive heart and chronic kidney disease with heart failure and stage 1 through stage 4 chronic kidney disease, or unspecified chronic kidney disease: Secondary | ICD-10-CM | POA: Diagnosis not present

## 2023-08-08 DIAGNOSIS — Z7952 Long term (current) use of systemic steroids: Secondary | ICD-10-CM | POA: Diagnosis not present

## 2023-08-08 DIAGNOSIS — R062 Wheezing: Secondary | ICD-10-CM | POA: Diagnosis not present

## 2023-08-08 DIAGNOSIS — I5032 Chronic diastolic (congestive) heart failure: Secondary | ICD-10-CM | POA: Diagnosis not present

## 2023-08-08 DIAGNOSIS — J439 Emphysema, unspecified: Secondary | ICD-10-CM | POA: Diagnosis not present

## 2023-08-08 DIAGNOSIS — J9602 Acute respiratory failure with hypercapnia: Secondary | ICD-10-CM | POA: Diagnosis not present

## 2023-08-08 DIAGNOSIS — E785 Hyperlipidemia, unspecified: Secondary | ICD-10-CM | POA: Diagnosis not present

## 2023-08-09 DIAGNOSIS — R0602 Shortness of breath: Secondary | ICD-10-CM | POA: Diagnosis not present

## 2023-08-09 DIAGNOSIS — J439 Emphysema, unspecified: Secondary | ICD-10-CM | POA: Diagnosis not present

## 2023-08-09 DIAGNOSIS — R0603 Acute respiratory distress: Secondary | ICD-10-CM | POA: Diagnosis not present

## 2023-08-09 DIAGNOSIS — J9622 Acute and chronic respiratory failure with hypercapnia: Secondary | ICD-10-CM | POA: Diagnosis not present

## 2023-08-09 DIAGNOSIS — J9811 Atelectasis: Secondary | ICD-10-CM | POA: Diagnosis not present

## 2023-08-09 DIAGNOSIS — J441 Chronic obstructive pulmonary disease with (acute) exacerbation: Secondary | ICD-10-CM | POA: Diagnosis not present

## 2023-08-09 DIAGNOSIS — R918 Other nonspecific abnormal finding of lung field: Secondary | ICD-10-CM | POA: Diagnosis not present

## 2023-08-09 DIAGNOSIS — J9621 Acute and chronic respiratory failure with hypoxia: Secondary | ICD-10-CM | POA: Diagnosis not present

## 2023-08-10 DIAGNOSIS — I517 Cardiomegaly: Secondary | ICD-10-CM | POA: Diagnosis not present

## 2023-08-10 DIAGNOSIS — I083 Combined rheumatic disorders of mitral, aortic and tricuspid valves: Secondary | ICD-10-CM | POA: Diagnosis not present

## 2023-08-11 DIAGNOSIS — J441 Chronic obstructive pulmonary disease with (acute) exacerbation: Secondary | ICD-10-CM | POA: Diagnosis not present

## 2023-08-12 DIAGNOSIS — J441 Chronic obstructive pulmonary disease with (acute) exacerbation: Secondary | ICD-10-CM | POA: Diagnosis not present

## 2023-08-13 DIAGNOSIS — J441 Chronic obstructive pulmonary disease with (acute) exacerbation: Secondary | ICD-10-CM | POA: Diagnosis not present

## 2023-08-14 ENCOUNTER — Ambulatory Visit: Payer: Medicare Other | Admitting: Family Medicine

## 2023-08-14 DIAGNOSIS — J441 Chronic obstructive pulmonary disease with (acute) exacerbation: Secondary | ICD-10-CM | POA: Diagnosis not present

## 2023-08-15 DIAGNOSIS — J441 Chronic obstructive pulmonary disease with (acute) exacerbation: Secondary | ICD-10-CM | POA: Diagnosis not present

## 2023-08-16 ENCOUNTER — Other Ambulatory Visit: Payer: Self-pay | Admitting: Family Medicine

## 2023-08-16 DIAGNOSIS — J441 Chronic obstructive pulmonary disease with (acute) exacerbation: Secondary | ICD-10-CM | POA: Diagnosis not present

## 2023-08-17 DIAGNOSIS — J441 Chronic obstructive pulmonary disease with (acute) exacerbation: Secondary | ICD-10-CM | POA: Diagnosis not present

## 2023-08-18 DIAGNOSIS — J441 Chronic obstructive pulmonary disease with (acute) exacerbation: Secondary | ICD-10-CM | POA: Diagnosis not present

## 2023-08-19 DIAGNOSIS — J441 Chronic obstructive pulmonary disease with (acute) exacerbation: Secondary | ICD-10-CM | POA: Diagnosis not present

## 2023-08-21 DIAGNOSIS — J9602 Acute respiratory failure with hypercapnia: Secondary | ICD-10-CM | POA: Diagnosis not present

## 2023-08-21 DIAGNOSIS — E039 Hypothyroidism, unspecified: Secondary | ICD-10-CM | POA: Diagnosis not present

## 2023-08-21 DIAGNOSIS — J9622 Acute and chronic respiratory failure with hypercapnia: Secondary | ICD-10-CM | POA: Diagnosis not present

## 2023-08-21 DIAGNOSIS — J441 Chronic obstructive pulmonary disease with (acute) exacerbation: Secondary | ICD-10-CM | POA: Diagnosis not present

## 2023-08-21 DIAGNOSIS — I2781 Cor pulmonale (chronic): Secondary | ICD-10-CM | POA: Diagnosis not present

## 2023-08-21 DIAGNOSIS — K521 Toxic gastroenteritis and colitis: Secondary | ICD-10-CM | POA: Diagnosis not present

## 2023-08-21 DIAGNOSIS — G4700 Insomnia, unspecified: Secondary | ICD-10-CM | POA: Diagnosis not present

## 2023-08-21 DIAGNOSIS — E785 Hyperlipidemia, unspecified: Secondary | ICD-10-CM | POA: Diagnosis not present

## 2023-08-21 DIAGNOSIS — B964 Proteus (mirabilis) (morganii) as the cause of diseases classified elsewhere: Secondary | ICD-10-CM | POA: Diagnosis not present

## 2023-08-21 DIAGNOSIS — Z9981 Dependence on supplemental oxygen: Secondary | ICD-10-CM | POA: Diagnosis not present

## 2023-08-21 DIAGNOSIS — E538 Deficiency of other specified B group vitamins: Secondary | ICD-10-CM | POA: Diagnosis not present

## 2023-08-21 DIAGNOSIS — I5032 Chronic diastolic (congestive) heart failure: Secondary | ICD-10-CM | POA: Diagnosis not present

## 2023-08-21 DIAGNOSIS — N179 Acute kidney failure, unspecified: Secondary | ICD-10-CM | POA: Diagnosis not present

## 2023-08-21 DIAGNOSIS — R0902 Hypoxemia: Secondary | ICD-10-CM | POA: Diagnosis not present

## 2023-08-21 DIAGNOSIS — G6181 Chronic inflammatory demyelinating polyneuritis: Secondary | ICD-10-CM | POA: Diagnosis not present

## 2023-08-21 DIAGNOSIS — Z466 Encounter for fitting and adjustment of urinary device: Secondary | ICD-10-CM | POA: Diagnosis not present

## 2023-08-21 DIAGNOSIS — R5381 Other malaise: Secondary | ICD-10-CM | POA: Diagnosis not present

## 2023-08-21 DIAGNOSIS — D72829 Elevated white blood cell count, unspecified: Secondary | ICD-10-CM | POA: Diagnosis not present

## 2023-08-21 DIAGNOSIS — Z7401 Bed confinement status: Secondary | ICD-10-CM | POA: Diagnosis not present

## 2023-08-21 DIAGNOSIS — J9601 Acute respiratory failure with hypoxia: Secondary | ICD-10-CM | POA: Diagnosis not present

## 2023-08-21 DIAGNOSIS — R2689 Other abnormalities of gait and mobility: Secondary | ICD-10-CM | POA: Diagnosis not present

## 2023-08-21 DIAGNOSIS — N319 Neuromuscular dysfunction of bladder, unspecified: Secondary | ICD-10-CM | POA: Diagnosis not present

## 2023-08-21 DIAGNOSIS — Z794 Long term (current) use of insulin: Secondary | ICD-10-CM | POA: Diagnosis not present

## 2023-08-21 DIAGNOSIS — N390 Urinary tract infection, site not specified: Secondary | ICD-10-CM | POA: Diagnosis not present

## 2023-08-21 DIAGNOSIS — D638 Anemia in other chronic diseases classified elsewhere: Secondary | ICD-10-CM | POA: Diagnosis not present

## 2023-08-21 DIAGNOSIS — E1165 Type 2 diabetes mellitus with hyperglycemia: Secondary | ICD-10-CM | POA: Diagnosis not present

## 2023-08-21 DIAGNOSIS — Z6835 Body mass index (BMI) 35.0-35.9, adult: Secondary | ICD-10-CM | POA: Diagnosis not present

## 2023-08-21 DIAGNOSIS — I110 Hypertensive heart disease with heart failure: Secondary | ICD-10-CM | POA: Diagnosis not present

## 2023-08-21 DIAGNOSIS — E66812 Obesity, class 2: Secondary | ICD-10-CM | POA: Diagnosis not present

## 2023-08-21 DIAGNOSIS — I959 Hypotension, unspecified: Secondary | ICD-10-CM | POA: Diagnosis not present

## 2023-08-21 DIAGNOSIS — B029 Zoster without complications: Secondary | ICD-10-CM | POA: Diagnosis not present

## 2023-08-21 DIAGNOSIS — G8929 Other chronic pain: Secondary | ICD-10-CM | POA: Diagnosis not present

## 2023-08-21 DIAGNOSIS — T83511A Infection and inflammatory reaction due to indwelling urethral catheter, initial encounter: Secondary | ICD-10-CM | POA: Diagnosis not present

## 2023-08-21 DIAGNOSIS — R339 Retention of urine, unspecified: Secondary | ICD-10-CM | POA: Diagnosis not present

## 2023-08-21 DIAGNOSIS — G4733 Obstructive sleep apnea (adult) (pediatric): Secondary | ICD-10-CM | POA: Diagnosis not present

## 2023-08-21 DIAGNOSIS — E1169 Type 2 diabetes mellitus with other specified complication: Secondary | ICD-10-CM | POA: Diagnosis not present

## 2023-08-21 DIAGNOSIS — I10 Essential (primary) hypertension: Secondary | ICD-10-CM | POA: Diagnosis not present

## 2023-08-22 DIAGNOSIS — D638 Anemia in other chronic diseases classified elsewhere: Secondary | ICD-10-CM | POA: Diagnosis not present

## 2023-08-22 DIAGNOSIS — I10 Essential (primary) hypertension: Secondary | ICD-10-CM | POA: Diagnosis not present

## 2023-08-22 DIAGNOSIS — R2689 Other abnormalities of gait and mobility: Secondary | ICD-10-CM | POA: Diagnosis not present

## 2023-08-22 DIAGNOSIS — I2781 Cor pulmonale (chronic): Secondary | ICD-10-CM | POA: Diagnosis not present

## 2023-08-24 ENCOUNTER — Inpatient Hospital Stay: Payer: Medicare Other | Admitting: Family Medicine

## 2023-08-31 ENCOUNTER — Other Ambulatory Visit: Payer: Self-pay | Admitting: Family Medicine

## 2023-09-05 ENCOUNTER — Telehealth: Payer: Self-pay

## 2023-09-05 DIAGNOSIS — E119 Type 2 diabetes mellitus without complications: Secondary | ICD-10-CM | POA: Diagnosis not present

## 2023-09-05 DIAGNOSIS — I959 Hypotension, unspecified: Secondary | ICD-10-CM | POA: Diagnosis not present

## 2023-09-05 DIAGNOSIS — I10 Essential (primary) hypertension: Secondary | ICD-10-CM | POA: Diagnosis not present

## 2023-09-05 DIAGNOSIS — I491 Atrial premature depolarization: Secondary | ICD-10-CM | POA: Diagnosis not present

## 2023-09-05 DIAGNOSIS — J449 Chronic obstructive pulmonary disease, unspecified: Secondary | ICD-10-CM | POA: Diagnosis not present

## 2023-09-05 DIAGNOSIS — R600 Localized edema: Secondary | ICD-10-CM | POA: Diagnosis not present

## 2023-09-05 DIAGNOSIS — Z6833 Body mass index (BMI) 33.0-33.9, adult: Secondary | ICD-10-CM | POA: Diagnosis not present

## 2023-09-05 DIAGNOSIS — E1142 Type 2 diabetes mellitus with diabetic polyneuropathy: Secondary | ICD-10-CM | POA: Diagnosis not present

## 2023-09-05 DIAGNOSIS — L03116 Cellulitis of left lower limb: Secondary | ICD-10-CM | POA: Diagnosis not present

## 2023-09-05 DIAGNOSIS — E559 Vitamin D deficiency, unspecified: Secondary | ICD-10-CM | POA: Diagnosis not present

## 2023-09-05 DIAGNOSIS — R5383 Other fatigue: Secondary | ICD-10-CM | POA: Diagnosis not present

## 2023-09-05 DIAGNOSIS — G6181 Chronic inflammatory demyelinating polyneuritis: Secondary | ICD-10-CM | POA: Diagnosis not present

## 2023-09-05 DIAGNOSIS — Z7952 Long term (current) use of systemic steroids: Secondary | ICD-10-CM | POA: Diagnosis not present

## 2023-09-05 DIAGNOSIS — E538 Deficiency of other specified B group vitamins: Secondary | ICD-10-CM | POA: Diagnosis not present

## 2023-09-05 DIAGNOSIS — E669 Obesity, unspecified: Secondary | ICD-10-CM | POA: Diagnosis not present

## 2023-09-05 DIAGNOSIS — M810 Age-related osteoporosis without current pathological fracture: Secondary | ICD-10-CM | POA: Diagnosis not present

## 2023-09-05 DIAGNOSIS — E039 Hypothyroidism, unspecified: Secondary | ICD-10-CM | POA: Diagnosis not present

## 2023-09-05 DIAGNOSIS — R918 Other nonspecific abnormal finding of lung field: Secondary | ICD-10-CM | POA: Diagnosis not present

## 2023-09-05 DIAGNOSIS — Z7401 Bed confinement status: Secondary | ICD-10-CM | POA: Diagnosis not present

## 2023-09-05 DIAGNOSIS — J439 Emphysema, unspecified: Secondary | ICD-10-CM | POA: Diagnosis not present

## 2023-09-05 DIAGNOSIS — M6281 Muscle weakness (generalized): Secondary | ICD-10-CM | POA: Diagnosis not present

## 2023-09-05 DIAGNOSIS — Z794 Long term (current) use of insulin: Secondary | ICD-10-CM | POA: Diagnosis not present

## 2023-09-05 DIAGNOSIS — R54 Age-related physical debility: Secondary | ICD-10-CM | POA: Diagnosis not present

## 2023-09-05 DIAGNOSIS — G4733 Obstructive sleep apnea (adult) (pediatric): Secondary | ICD-10-CM | POA: Diagnosis not present

## 2023-09-05 DIAGNOSIS — Z87891 Personal history of nicotine dependence: Secondary | ICD-10-CM | POA: Diagnosis not present

## 2023-09-05 DIAGNOSIS — H3530 Unspecified macular degeneration: Secondary | ICD-10-CM | POA: Diagnosis not present

## 2023-09-05 DIAGNOSIS — E785 Hyperlipidemia, unspecified: Secondary | ICD-10-CM | POA: Diagnosis not present

## 2023-09-05 DIAGNOSIS — L0390 Cellulitis, unspecified: Secondary | ICD-10-CM | POA: Diagnosis not present

## 2023-09-05 DIAGNOSIS — Z95828 Presence of other vascular implants and grafts: Secondary | ICD-10-CM | POA: Diagnosis not present

## 2023-09-05 DIAGNOSIS — G894 Chronic pain syndrome: Secondary | ICD-10-CM | POA: Diagnosis not present

## 2023-09-05 DIAGNOSIS — Z96659 Presence of unspecified artificial knee joint: Secondary | ICD-10-CM | POA: Diagnosis not present

## 2023-09-05 DIAGNOSIS — R0600 Dyspnea, unspecified: Secondary | ICD-10-CM | POA: Diagnosis not present

## 2023-09-05 DIAGNOSIS — Z9981 Dependence on supplemental oxygen: Secondary | ICD-10-CM | POA: Diagnosis not present

## 2023-09-05 DIAGNOSIS — Z79620 Long term (current) use of immunosuppressive biologic: Secondary | ICD-10-CM | POA: Diagnosis not present

## 2023-09-05 DIAGNOSIS — R262 Difficulty in walking, not elsewhere classified: Secondary | ICD-10-CM | POA: Diagnosis not present

## 2023-09-05 DIAGNOSIS — R531 Weakness: Secondary | ICD-10-CM | POA: Diagnosis not present

## 2023-09-05 DIAGNOSIS — J441 Chronic obstructive pulmonary disease with (acute) exacerbation: Secondary | ICD-10-CM | POA: Diagnosis not present

## 2023-09-05 DIAGNOSIS — K589 Irritable bowel syndrome without diarrhea: Secondary | ICD-10-CM | POA: Diagnosis not present

## 2023-09-05 DIAGNOSIS — Z993 Dependence on wheelchair: Secondary | ICD-10-CM | POA: Diagnosis not present

## 2023-09-05 DIAGNOSIS — Z9049 Acquired absence of other specified parts of digestive tract: Secondary | ICD-10-CM | POA: Diagnosis not present

## 2023-09-05 DIAGNOSIS — R059 Cough, unspecified: Secondary | ICD-10-CM | POA: Diagnosis not present

## 2023-09-05 DIAGNOSIS — G4730 Sleep apnea, unspecified: Secondary | ICD-10-CM | POA: Diagnosis not present

## 2023-09-05 DIAGNOSIS — R200 Anesthesia of skin: Secondary | ICD-10-CM | POA: Diagnosis not present

## 2023-09-05 DIAGNOSIS — L03115 Cellulitis of right lower limb: Secondary | ICD-10-CM | POA: Diagnosis not present

## 2023-09-05 NOTE — Telephone Encounter (Signed)
Copied from CRM [id number]. Topic: Clinical - Medical Advice >> Sep 05, 2023 11:43 AM Hector Shade B wrote: Reason for CRM: Patient's daugher Ms. Calcutt, is concerned about patient, stated her mother was brought home yesterday from Rehab, she stated while mom was in rehab she looked good on paper for physical therapy she has gotten home and has not moved she's very worried and wondering if she should take her to the ER, have someone come in for PT she's confused and has asked someone from the office to call her back as soon as possible. Her number is [phone].

## 2023-09-05 NOTE — Telephone Encounter (Signed)
Spoke to patient daughter.  [name] has been sent to ER via exam from [facility]. She states that she is being admitted for lower extremities not working at all ( upper extremities working fine)  And they will look into long term placement for her.  She has cancelled upcoming appt schld for 09/12/23 , 09/13/23 and 09/18/23 stating that the nurse at hospital told her that she would be in rehab for the next full month at least

## 2023-09-06 ENCOUNTER — Ambulatory Visit: Payer: Medicare Other | Admitting: Podiatry

## 2023-09-06 ENCOUNTER — Telehealth: Payer: Self-pay | Admitting: Podiatry

## 2023-09-06 DIAGNOSIS — J441 Chronic obstructive pulmonary disease with (acute) exacerbation: Secondary | ICD-10-CM | POA: Diagnosis not present

## 2023-09-06 NOTE — Telephone Encounter (Signed)
Pts daughter left message today at 751am stating pt was admitted to the hospital in [place] late last night so the appt for today needs to be cxled.She asked if someone could call to confirm we received message.  I returned call and left message for pts daughter that the appt has been canceled and to call to r/s when pt is able to come in.

## 2023-09-07 DIAGNOSIS — J441 Chronic obstructive pulmonary disease with (acute) exacerbation: Secondary | ICD-10-CM | POA: Diagnosis not present

## 2023-09-08 DIAGNOSIS — J441 Chronic obstructive pulmonary disease with (acute) exacerbation: Secondary | ICD-10-CM | POA: Diagnosis not present

## 2023-09-09 DIAGNOSIS — J441 Chronic obstructive pulmonary disease with (acute) exacerbation: Secondary | ICD-10-CM | POA: Diagnosis not present

## 2023-09-10 DIAGNOSIS — J441 Chronic obstructive pulmonary disease with (acute) exacerbation: Secondary | ICD-10-CM | POA: Diagnosis not present

## 2023-09-11 DIAGNOSIS — M810 Age-related osteoporosis without current pathological fracture: Secondary | ICD-10-CM | POA: Diagnosis not present

## 2023-09-11 DIAGNOSIS — E538 Deficiency of other specified B group vitamins: Secondary | ICD-10-CM | POA: Diagnosis not present

## 2023-09-11 DIAGNOSIS — E1142 Type 2 diabetes mellitus with diabetic polyneuropathy: Secondary | ICD-10-CM | POA: Diagnosis not present

## 2023-09-11 DIAGNOSIS — E039 Hypothyroidism, unspecified: Secondary | ICD-10-CM | POA: Diagnosis not present

## 2023-09-11 DIAGNOSIS — Z87891 Personal history of nicotine dependence: Secondary | ICD-10-CM | POA: Diagnosis not present

## 2023-09-11 DIAGNOSIS — I10 Essential (primary) hypertension: Secondary | ICD-10-CM | POA: Diagnosis not present

## 2023-09-11 DIAGNOSIS — M6281 Muscle weakness (generalized): Secondary | ICD-10-CM | POA: Diagnosis not present

## 2023-09-11 DIAGNOSIS — K589 Irritable bowel syndrome without diarrhea: Secondary | ICD-10-CM | POA: Diagnosis not present

## 2023-09-11 DIAGNOSIS — I959 Hypotension, unspecified: Secondary | ICD-10-CM | POA: Diagnosis not present

## 2023-09-11 DIAGNOSIS — R262 Difficulty in walking, not elsewhere classified: Secondary | ICD-10-CM | POA: Diagnosis not present

## 2023-09-11 DIAGNOSIS — I2510 Atherosclerotic heart disease of native coronary artery without angina pectoris: Secondary | ICD-10-CM | POA: Diagnosis not present

## 2023-09-11 DIAGNOSIS — Z9981 Dependence on supplemental oxygen: Secondary | ICD-10-CM | POA: Diagnosis not present

## 2023-09-11 DIAGNOSIS — H3530 Unspecified macular degeneration: Secondary | ICD-10-CM | POA: Diagnosis not present

## 2023-09-11 DIAGNOSIS — E559 Vitamin D deficiency, unspecified: Secondary | ICD-10-CM | POA: Diagnosis not present

## 2023-09-11 DIAGNOSIS — F329 Major depressive disorder, single episode, unspecified: Secondary | ICD-10-CM | POA: Diagnosis not present

## 2023-09-11 DIAGNOSIS — E785 Hyperlipidemia, unspecified: Secondary | ICD-10-CM | POA: Diagnosis not present

## 2023-09-11 DIAGNOSIS — J441 Chronic obstructive pulmonary disease with (acute) exacerbation: Secondary | ICD-10-CM | POA: Diagnosis not present

## 2023-09-11 DIAGNOSIS — J449 Chronic obstructive pulmonary disease, unspecified: Secondary | ICD-10-CM | POA: Diagnosis not present

## 2023-09-11 DIAGNOSIS — L0390 Cellulitis, unspecified: Secondary | ICD-10-CM | POA: Diagnosis not present

## 2023-09-11 DIAGNOSIS — Z794 Long term (current) use of insulin: Secondary | ICD-10-CM | POA: Diagnosis not present

## 2023-09-11 DIAGNOSIS — E119 Type 2 diabetes mellitus without complications: Secondary | ICD-10-CM | POA: Diagnosis not present

## 2023-09-11 DIAGNOSIS — Z7401 Bed confinement status: Secondary | ICD-10-CM | POA: Diagnosis not present

## 2023-09-11 DIAGNOSIS — R0600 Dyspnea, unspecified: Secondary | ICD-10-CM | POA: Diagnosis not present

## 2023-09-11 DIAGNOSIS — G894 Chronic pain syndrome: Secondary | ICD-10-CM | POA: Diagnosis not present

## 2023-09-11 DIAGNOSIS — G4733 Obstructive sleep apnea (adult) (pediatric): Secondary | ICD-10-CM | POA: Diagnosis not present

## 2023-09-12 ENCOUNTER — Inpatient Hospital Stay: Payer: Medicare Other | Admitting: Family Medicine

## 2023-09-13 ENCOUNTER — Inpatient Hospital Stay: Payer: Medicare Other | Admitting: Family Medicine

## 2023-09-14 DIAGNOSIS — M6281 Muscle weakness (generalized): Secondary | ICD-10-CM | POA: Diagnosis not present

## 2023-09-14 DIAGNOSIS — J441 Chronic obstructive pulmonary disease with (acute) exacerbation: Secondary | ICD-10-CM | POA: Diagnosis not present

## 2023-09-14 DIAGNOSIS — E559 Vitamin D deficiency, unspecified: Secondary | ICD-10-CM | POA: Diagnosis not present

## 2023-09-14 DIAGNOSIS — M810 Age-related osteoporosis without current pathological fracture: Secondary | ICD-10-CM | POA: Diagnosis not present

## 2023-09-14 DIAGNOSIS — I959 Hypotension, unspecified: Secondary | ICD-10-CM | POA: Diagnosis not present

## 2023-09-14 DIAGNOSIS — R262 Difficulty in walking, not elsewhere classified: Secondary | ICD-10-CM | POA: Diagnosis not present

## 2023-09-14 DIAGNOSIS — E119 Type 2 diabetes mellitus without complications: Secondary | ICD-10-CM | POA: Diagnosis not present

## 2023-09-14 DIAGNOSIS — G894 Chronic pain syndrome: Secondary | ICD-10-CM | POA: Diagnosis not present

## 2023-09-14 DIAGNOSIS — E538 Deficiency of other specified B group vitamins: Secondary | ICD-10-CM | POA: Diagnosis not present

## 2023-09-17 DIAGNOSIS — E538 Deficiency of other specified B group vitamins: Secondary | ICD-10-CM | POA: Diagnosis not present

## 2023-09-17 DIAGNOSIS — E559 Vitamin D deficiency, unspecified: Secondary | ICD-10-CM | POA: Diagnosis not present

## 2023-09-17 DIAGNOSIS — J441 Chronic obstructive pulmonary disease with (acute) exacerbation: Secondary | ICD-10-CM | POA: Diagnosis not present

## 2023-09-17 DIAGNOSIS — M6281 Muscle weakness (generalized): Secondary | ICD-10-CM | POA: Diagnosis not present

## 2023-09-17 DIAGNOSIS — G894 Chronic pain syndrome: Secondary | ICD-10-CM | POA: Diagnosis not present

## 2023-09-17 DIAGNOSIS — I959 Hypotension, unspecified: Secondary | ICD-10-CM | POA: Diagnosis not present

## 2023-09-17 DIAGNOSIS — E119 Type 2 diabetes mellitus without complications: Secondary | ICD-10-CM | POA: Diagnosis not present

## 2023-09-17 DIAGNOSIS — R262 Difficulty in walking, not elsewhere classified: Secondary | ICD-10-CM | POA: Diagnosis not present

## 2023-09-17 DIAGNOSIS — M810 Age-related osteoporosis without current pathological fracture: Secondary | ICD-10-CM | POA: Diagnosis not present

## 2023-09-18 ENCOUNTER — Ambulatory Visit: Payer: Medicare Other | Admitting: Family Medicine

## 2023-09-19 DIAGNOSIS — E119 Type 2 diabetes mellitus without complications: Secondary | ICD-10-CM | POA: Diagnosis not present

## 2023-09-19 DIAGNOSIS — G894 Chronic pain syndrome: Secondary | ICD-10-CM | POA: Diagnosis not present

## 2023-09-19 DIAGNOSIS — I2510 Atherosclerotic heart disease of native coronary artery without angina pectoris: Secondary | ICD-10-CM | POA: Diagnosis not present

## 2023-09-19 DIAGNOSIS — F329 Major depressive disorder, single episode, unspecified: Secondary | ICD-10-CM | POA: Diagnosis not present

## 2023-09-19 DIAGNOSIS — J441 Chronic obstructive pulmonary disease with (acute) exacerbation: Secondary | ICD-10-CM | POA: Diagnosis not present

## 2023-09-20 DIAGNOSIS — M6281 Muscle weakness (generalized): Secondary | ICD-10-CM | POA: Diagnosis not present

## 2023-09-20 DIAGNOSIS — E559 Vitamin D deficiency, unspecified: Secondary | ICD-10-CM | POA: Diagnosis not present

## 2023-09-20 DIAGNOSIS — R262 Difficulty in walking, not elsewhere classified: Secondary | ICD-10-CM | POA: Diagnosis not present

## 2023-09-20 DIAGNOSIS — E119 Type 2 diabetes mellitus without complications: Secondary | ICD-10-CM | POA: Diagnosis not present

## 2023-09-20 DIAGNOSIS — I959 Hypotension, unspecified: Secondary | ICD-10-CM | POA: Diagnosis not present

## 2023-09-20 DIAGNOSIS — E538 Deficiency of other specified B group vitamins: Secondary | ICD-10-CM | POA: Diagnosis not present

## 2023-09-20 DIAGNOSIS — G894 Chronic pain syndrome: Secondary | ICD-10-CM | POA: Diagnosis not present

## 2023-09-20 DIAGNOSIS — J441 Chronic obstructive pulmonary disease with (acute) exacerbation: Secondary | ICD-10-CM | POA: Diagnosis not present

## 2023-09-20 DIAGNOSIS — M810 Age-related osteoporosis without current pathological fracture: Secondary | ICD-10-CM | POA: Diagnosis not present

## 2023-09-25 DIAGNOSIS — M810 Age-related osteoporosis without current pathological fracture: Secondary | ICD-10-CM | POA: Diagnosis not present

## 2023-09-25 DIAGNOSIS — I959 Hypotension, unspecified: Secondary | ICD-10-CM | POA: Diagnosis not present

## 2023-09-25 DIAGNOSIS — E538 Deficiency of other specified B group vitamins: Secondary | ICD-10-CM | POA: Diagnosis not present

## 2023-09-25 DIAGNOSIS — E559 Vitamin D deficiency, unspecified: Secondary | ICD-10-CM | POA: Diagnosis not present

## 2023-09-25 DIAGNOSIS — R262 Difficulty in walking, not elsewhere classified: Secondary | ICD-10-CM | POA: Diagnosis not present

## 2023-09-25 DIAGNOSIS — M6281 Muscle weakness (generalized): Secondary | ICD-10-CM | POA: Diagnosis not present

## 2023-09-25 DIAGNOSIS — J441 Chronic obstructive pulmonary disease with (acute) exacerbation: Secondary | ICD-10-CM | POA: Diagnosis not present

## 2023-09-25 DIAGNOSIS — G894 Chronic pain syndrome: Secondary | ICD-10-CM | POA: Diagnosis not present

## 2023-09-25 DIAGNOSIS — E119 Type 2 diabetes mellitus without complications: Secondary | ICD-10-CM | POA: Diagnosis not present

## 2023-09-27 DIAGNOSIS — M6281 Muscle weakness (generalized): Secondary | ICD-10-CM | POA: Diagnosis not present

## 2023-09-27 DIAGNOSIS — G894 Chronic pain syndrome: Secondary | ICD-10-CM | POA: Diagnosis not present

## 2023-09-27 DIAGNOSIS — E559 Vitamin D deficiency, unspecified: Secondary | ICD-10-CM | POA: Diagnosis not present

## 2023-09-27 DIAGNOSIS — R262 Difficulty in walking, not elsewhere classified: Secondary | ICD-10-CM | POA: Diagnosis not present

## 2023-09-27 DIAGNOSIS — E119 Type 2 diabetes mellitus without complications: Secondary | ICD-10-CM | POA: Diagnosis not present

## 2023-09-27 DIAGNOSIS — M810 Age-related osteoporosis without current pathological fracture: Secondary | ICD-10-CM | POA: Diagnosis not present

## 2023-09-27 DIAGNOSIS — E538 Deficiency of other specified B group vitamins: Secondary | ICD-10-CM | POA: Diagnosis not present

## 2023-09-27 DIAGNOSIS — I959 Hypotension, unspecified: Secondary | ICD-10-CM | POA: Diagnosis not present

## 2023-09-27 DIAGNOSIS — J441 Chronic obstructive pulmonary disease with (acute) exacerbation: Secondary | ICD-10-CM | POA: Diagnosis not present

## 2023-09-29 ENCOUNTER — Other Ambulatory Visit: Payer: Self-pay | Admitting: Family Medicine

## 2023-10-02 DIAGNOSIS — G894 Chronic pain syndrome: Secondary | ICD-10-CM | POA: Diagnosis not present

## 2023-10-02 DIAGNOSIS — I959 Hypotension, unspecified: Secondary | ICD-10-CM | POA: Diagnosis not present

## 2023-10-02 DIAGNOSIS — E119 Type 2 diabetes mellitus without complications: Secondary | ICD-10-CM | POA: Diagnosis not present

## 2023-10-02 DIAGNOSIS — M6281 Muscle weakness (generalized): Secondary | ICD-10-CM | POA: Diagnosis not present

## 2023-10-02 DIAGNOSIS — E538 Deficiency of other specified B group vitamins: Secondary | ICD-10-CM | POA: Diagnosis not present

## 2023-10-02 DIAGNOSIS — E559 Vitamin D deficiency, unspecified: Secondary | ICD-10-CM | POA: Diagnosis not present

## 2023-10-02 DIAGNOSIS — R262 Difficulty in walking, not elsewhere classified: Secondary | ICD-10-CM | POA: Diagnosis not present

## 2023-10-02 DIAGNOSIS — J441 Chronic obstructive pulmonary disease with (acute) exacerbation: Secondary | ICD-10-CM | POA: Diagnosis not present

## 2023-10-02 DIAGNOSIS — M810 Age-related osteoporosis without current pathological fracture: Secondary | ICD-10-CM | POA: Diagnosis not present

## 2023-10-04 DIAGNOSIS — M810 Age-related osteoporosis without current pathological fracture: Secondary | ICD-10-CM | POA: Diagnosis not present

## 2023-10-04 DIAGNOSIS — J441 Chronic obstructive pulmonary disease with (acute) exacerbation: Secondary | ICD-10-CM | POA: Diagnosis not present

## 2023-10-04 DIAGNOSIS — E119 Type 2 diabetes mellitus without complications: Secondary | ICD-10-CM | POA: Diagnosis not present

## 2023-10-04 DIAGNOSIS — G894 Chronic pain syndrome: Secondary | ICD-10-CM | POA: Diagnosis not present

## 2023-10-04 DIAGNOSIS — E559 Vitamin D deficiency, unspecified: Secondary | ICD-10-CM | POA: Diagnosis not present

## 2023-10-04 DIAGNOSIS — R262 Difficulty in walking, not elsewhere classified: Secondary | ICD-10-CM | POA: Diagnosis not present

## 2023-10-04 DIAGNOSIS — E538 Deficiency of other specified B group vitamins: Secondary | ICD-10-CM | POA: Diagnosis not present

## 2023-10-04 DIAGNOSIS — I959 Hypotension, unspecified: Secondary | ICD-10-CM | POA: Diagnosis not present

## 2023-10-04 DIAGNOSIS — M6281 Muscle weakness (generalized): Secondary | ICD-10-CM | POA: Diagnosis not present

## 2023-10-09 DIAGNOSIS — M810 Age-related osteoporosis without current pathological fracture: Secondary | ICD-10-CM | POA: Diagnosis not present

## 2023-10-09 DIAGNOSIS — J441 Chronic obstructive pulmonary disease with (acute) exacerbation: Secondary | ICD-10-CM | POA: Diagnosis not present

## 2023-10-09 DIAGNOSIS — I959 Hypotension, unspecified: Secondary | ICD-10-CM | POA: Diagnosis not present

## 2023-10-09 DIAGNOSIS — G894 Chronic pain syndrome: Secondary | ICD-10-CM | POA: Diagnosis not present

## 2023-10-09 DIAGNOSIS — M6281 Muscle weakness (generalized): Secondary | ICD-10-CM | POA: Diagnosis not present

## 2023-10-09 DIAGNOSIS — E119 Type 2 diabetes mellitus without complications: Secondary | ICD-10-CM | POA: Diagnosis not present

## 2023-10-09 DIAGNOSIS — R262 Difficulty in walking, not elsewhere classified: Secondary | ICD-10-CM | POA: Diagnosis not present

## 2023-10-09 DIAGNOSIS — E538 Deficiency of other specified B group vitamins: Secondary | ICD-10-CM | POA: Diagnosis not present

## 2023-10-09 DIAGNOSIS — E559 Vitamin D deficiency, unspecified: Secondary | ICD-10-CM | POA: Diagnosis not present

## 2023-10-11 ENCOUNTER — Telehealth: Payer: Self-pay | Admitting: Family Medicine

## 2023-10-11 DIAGNOSIS — M810 Age-related osteoporosis without current pathological fracture: Secondary | ICD-10-CM | POA: Diagnosis not present

## 2023-10-11 DIAGNOSIS — G894 Chronic pain syndrome: Secondary | ICD-10-CM | POA: Diagnosis not present

## 2023-10-11 DIAGNOSIS — E559 Vitamin D deficiency, unspecified: Secondary | ICD-10-CM | POA: Diagnosis not present

## 2023-10-11 DIAGNOSIS — E119 Type 2 diabetes mellitus without complications: Secondary | ICD-10-CM | POA: Diagnosis not present

## 2023-10-11 DIAGNOSIS — E538 Deficiency of other specified B group vitamins: Secondary | ICD-10-CM | POA: Diagnosis not present

## 2023-10-11 DIAGNOSIS — I959 Hypotension, unspecified: Secondary | ICD-10-CM | POA: Diagnosis not present

## 2023-10-11 DIAGNOSIS — M6281 Muscle weakness (generalized): Secondary | ICD-10-CM | POA: Diagnosis not present

## 2023-10-11 DIAGNOSIS — R262 Difficulty in walking, not elsewhere classified: Secondary | ICD-10-CM | POA: Diagnosis not present

## 2023-10-11 DIAGNOSIS — J441 Chronic obstructive pulmonary disease with (acute) exacerbation: Secondary | ICD-10-CM | POA: Diagnosis not present

## 2023-10-11 NOTE — Telephone Encounter (Signed)
 It sounds like she needs a hospital follow-up scheduled.  Will not be here next week so we may need to look at trying to get her in on somebody else's schedule.

## 2023-10-11 NOTE — Telephone Encounter (Signed)
 Copied from CRM [id number]. Topic: Appointments - Scheduling Inquiry for Clinic >> Oct 10, 2023  9:02 AM Nila Nephew wrote: Reason for CRM: Physical Therapy Rehab calling regarding a discharge appointment. States appointment must be within 10 days or discharge. Refused to provide location name, caller name, a discharge date, or a call-back number.  I'm not sure what facility she is in and the phone number please advise

## 2023-10-11 NOTE — Telephone Encounter (Signed)
 Tried calling patient no answer on home phone left message on cell phone to call us and schedule an appointment

## 2023-10-15 DIAGNOSIS — J441 Chronic obstructive pulmonary disease with (acute) exacerbation: Secondary | ICD-10-CM | POA: Diagnosis not present

## 2023-10-15 DIAGNOSIS — E559 Vitamin D deficiency, unspecified: Secondary | ICD-10-CM | POA: Diagnosis not present

## 2023-10-15 DIAGNOSIS — G894 Chronic pain syndrome: Secondary | ICD-10-CM | POA: Diagnosis not present

## 2023-10-15 DIAGNOSIS — E119 Type 2 diabetes mellitus without complications: Secondary | ICD-10-CM | POA: Diagnosis not present

## 2023-10-15 DIAGNOSIS — I959 Hypotension, unspecified: Secondary | ICD-10-CM | POA: Diagnosis not present

## 2023-10-15 DIAGNOSIS — E538 Deficiency of other specified B group vitamins: Secondary | ICD-10-CM | POA: Diagnosis not present

## 2023-10-15 DIAGNOSIS — M810 Age-related osteoporosis without current pathological fracture: Secondary | ICD-10-CM | POA: Diagnosis not present

## 2023-10-15 DIAGNOSIS — M6281 Muscle weakness (generalized): Secondary | ICD-10-CM | POA: Diagnosis not present

## 2023-10-15 DIAGNOSIS — R262 Difficulty in walking, not elsewhere classified: Secondary | ICD-10-CM | POA: Diagnosis not present

## 2023-10-16 DIAGNOSIS — E538 Deficiency of other specified B group vitamins: Secondary | ICD-10-CM | POA: Diagnosis not present

## 2023-10-16 DIAGNOSIS — E559 Vitamin D deficiency, unspecified: Secondary | ICD-10-CM | POA: Diagnosis not present

## 2023-10-16 DIAGNOSIS — I959 Hypotension, unspecified: Secondary | ICD-10-CM | POA: Diagnosis not present

## 2023-10-16 DIAGNOSIS — J441 Chronic obstructive pulmonary disease with (acute) exacerbation: Secondary | ICD-10-CM | POA: Diagnosis not present

## 2023-10-16 DIAGNOSIS — E119 Type 2 diabetes mellitus without complications: Secondary | ICD-10-CM | POA: Diagnosis not present

## 2023-10-16 DIAGNOSIS — M810 Age-related osteoporosis without current pathological fracture: Secondary | ICD-10-CM | POA: Diagnosis not present

## 2023-10-16 DIAGNOSIS — M6281 Muscle weakness (generalized): Secondary | ICD-10-CM | POA: Diagnosis not present

## 2023-10-16 DIAGNOSIS — G894 Chronic pain syndrome: Secondary | ICD-10-CM | POA: Diagnosis not present

## 2023-10-16 DIAGNOSIS — R262 Difficulty in walking, not elsewhere classified: Secondary | ICD-10-CM | POA: Diagnosis not present

## 2023-10-17 ENCOUNTER — Telehealth: Payer: Self-pay

## 2023-10-17 ENCOUNTER — Other Ambulatory Visit: Payer: Self-pay | Admitting: *Deleted

## 2023-10-17 ENCOUNTER — Telehealth: Payer: Self-pay | Admitting: Family Medicine

## 2023-10-17 MED ORDER — TRAZODONE HCL 50 MG PO TABS: 50.0000 mg | ORAL_TABLET | Freq: Every day | ORAL | 0 refills | Status: DC

## 2023-10-17 NOTE — Telephone Encounter (Signed)
 Copied from CRM [id number]. Topic: General - Other >> Oct 17, 2023  4:31 PM Marlow Baars wrote: Reason for CRM: Toma Copier a PT with [facility] called in stating she spoke with the patient about scheduling and she has opted to delay her appt with them until Monday March 17 stating she has some appts tomorrow and Friday. She just wanted to pass this along and if there are any questions to please contact [phone] and press Option 2

## 2023-10-17 NOTE — Transitions of Care (Post Inpatient/ED Visit) (Signed)
 10/17/2023  Name: [name] MRN: [id number] DOB: 05-Aug-1946  Today's TOC FU Call Status: Today's TOC FU Call Status:: Successful TOC FU Call Completed TOC FU Call Complete Date: 10/17/23 Patient's Name and Date of Birth confirmed.  Transition Care Management Follow-up Telephone Call Date of Discharge: 10/16/23 Discharge Facility: Other [facility]) Name of Other (Non[facility]) Discharge Facility: [facility] Type of Discharge: Inpatient Admission Primary Inpatient Discharge Diagnosis:: hypothyroidism How have you been since you were released from the hospital?: Better Any questions or concerns?: No  Items Reviewed: Did you receive and understand the discharge instructions provided?: Yes Medications obtained,verified, and reconciled?: Yes (Medications Reviewed) Any new allergies since your discharge?: No Dietary orders reviewed?: Yes Do you have support at home?: No  Medications Reviewed Today: Medications Reviewed Today     Reviewed by Karena Addison, LPN (Licensed Practical Nurse) on 10/17/23 at 0957  Med List Status: <None>   Medication Order Taking? Sig Documenting Provider Last Dose Status Informant  albuterol (VENTOLIN HFA) 108 (90 Base) MCG/ACT inhaler [id number] No Inhale 2 puffs into the lungs every 6 (six) hours as needed for wheezing or shortness of breath. DX:J44.1 Agapito Games, MD Taking Active   alendronate (FOSAMAX) 70 MG tablet [id number]  TAKE 1 TABLET BY MOUTH EVERY 7 DAYS. TAKE IN THE MORNING WITH A FULL GLASS OF WATER ON AN EMPTY STOMACH. DO NOT TAKE OTHER FOOD OR DRINK OR LIE DOWN Agapito Games, MD  Active   Kipp Laurence MEDICATION [id number] No Medication Name: Bilateral knee braces for weakness,foot drop.Medication Name: Bilateral knee braces for weakness,foot drop. Restore Phone: [phone] Fax : [phone] Agapito Games, MD Taking Active   Kipp Laurence MEDICATION [id number]  Medication Name:  Flutter valve for COPD Agapito Games, MD  Active   AMBULATORY NON [facility] MEDICATION [id number]  Medication Name: overnight pulse ox.  Dx. Severe COPD Agapito Games, MD  Active   Kipp Laurence MEDICATION [id number]  Medication Name: Nebulizer for severe COPD.   Please deliver this week. Agapito Games, MD  Active   aspirin EC 81 MG tablet [id number] No Take 81 mg by mouth daily. [provider] Taking Active   blood glucose meter kit and supplies KIT [id number] No Dispense based on patient and insurance preference. Use up to four times daily as directed Dx E11.40 Agapito Games, MD Taking Active   calcium carbonate (OS-CAL) 600 MG tablet [id number] No Take by mouth. [provider] Taking Active   clopidogrel (PLAVIX) 75 MG tablet [id number] No TAKE ONE TABLET BY MOUTH EVERY DAY Agapito Games, MD Taking Active   dicyclomine (BENTYL) 20 MG tablet [id number]  TAKE ONE TABLET BY MOUTH THREE TIMES DAILY AS NEEDED FOR SPASMS Agapito Games, MD  Active   diphenoxylate-atropine (LOMOTIL) 2.5-0.025 MG tablet [id number]  TAKE ONE OR TWO TABLETS BY MOUTH TWICE DAILY AS NEEDED FOR DIARRHEA OR LOOSE STOOLS Agapito Games, MD  Active   DULoxetine (CYMBALTA) 30 MG capsule [id number]  Take 2 capsules (60 mg total) by mouth daily. Agapito Games, MD  Expired 09/09/23 2359   Fluticasone-Umeclidin-Vilant (TRELEGY ELLIPTA) 100-62.5-25 MCG/ACT AEPB [id number]  Inhale 1 puff into the lungs daily. Agapito Games, MD  Active   furosemide (LASIX) 40 MG tablet [id number]  Take 1-2 tablets (40-80 mg total) by mouth daily. Agapito Games, MD  Active   GAMUNEX-C 10 GM/100ML SOLN [id number]   [provider]  Active   insulin glargine (LANTUS  SOLOSTAR) 100 UNIT/ML Solostar Pen [id number]  Inject 45-60 Units into the skin at bedtime. Agapito Games, MD  Active   ipratropium-albuterol (DUONEB) 0.5-2.5 (3) MG/3ML SOLN [id number]   Take 3 mLs by nebulization every 4 (four) hours as needed. Agapito Games, MD  Active   levothyroxine (SYNTHROID) 112 MCG tablet [id number]  Take 1 tablet (112 mcg total) by mouth daily. Needs labs Agapito Games, MD  Active   lidocaine (XYLOCAINE) 5 % ointment [id number]  Apply 1 application topically daily. 2 hours before skin procedure. Agapito Games, MD  Active   magic mouthwash (nystatin, lidocaine, diphenhydrAMINE, alum & mag hydroxide) suspension [id number]  Swish and spit 5 mLs 3 (three) times daily as needed for mouth pain. Agapito Games, MD  Active   meclizine (ANTIVERT) 25 MG tablet [id number]  Take 1 tablet (25 mg total) by mouth 3 (three) times daily as needed for dizziness. Agapito Games, MD  Active   metFORMIN (GLUCOPHAGE-XR) 500 MG 24 hr tablet [id number]  Take 2 tablets (1,000 mg total) by mouth daily with breakfast. Agapito Games, MD  Active   Multiple Vitamins-Minerals (PRESERVISION AREDS 2) CAPS [id number] No  [provider] Taking Active   mycophenolate (CELLCEPT) 500 MG tablet [id number] No Take by mouth. Lenord Carbo., MD Taking Active   nystatin-triamcinolone ointment [facility]) [id number]  apply ONE application topically TWICE DAILY FOR 2-3 WEEKS Agapito Games, MD  Active   potassium chloride SA (KLOR-CON M) 20 MEQ tablet [id number]  Take 1 tablet (20 mEq total) by mouth daily. Agapito Games, MD  Active   predniSONE (DELTASONE) 20 MG tablet [id number]  Take 2 tablets (40 mg total) by mouth daily with breakfast. Agapito Games, MD  Active   pregabalin (LYRICA) 50 MG capsule [id number] No Take 50 mg by mouth daily.  TAKE ONE CAPSULE (50 MG) BY MOUTH IN THE MORNING, AND TWO CAPSULES (100 MG) IN THE EVENING FOR TWO WEEKS, THEN ONE CAPSULE (50 MG) IN THE MORNING AND THREE CAPSULES (150 MG) IN THE EVENING until follow up [provider] Taking Active   simvastatin (ZOCOR) 80 MG tablet [id number]  TAKE ONE  TABLET BY MOUTH EVERY DAY Agapito Games, MD  Active   traZODone (DESYREL) 50 MG tablet [id number]  Take 1 tablet (50 mg total) by mouth at bedtime. Agapito Games, MD  Active   TRUEPLUS PEN NEEDLES 32G X 4 MM MISC [id number] No USE WHEN INJECTING INSULIN DAILY Agapito Games, MD Taking Active   zolpidem (AMBIEN) 5 MG tablet [id number]  Take 1/2-1 tablet (2.5-5 mg total) by mouth at bedtime. Agapito Games, MD  Active             Home Care and Equipment/Supplies: Were Home Health Services Ordered?: Yes [facility] of [facility] Agency[facility] Has Agency set up a time to come to your home?: Yes First Home Health Visit Date: 10/19/23 Any new equipment or medical supplies ordered?: NA  Functional Questionnaire: Do you need assistance with bathing/showering or dressing?: No Do you need assistance with meal preparation?: No Do you need assistance with eating?: No Do you have difficulty maintaining continence: No Do you need assistance with getting out of bed/getting out of a chair/moving?: No Do you have difficulty managing or taking your medications?: Yes  Follow up appointments reviewed: PCP Follow-up appointment confirmed?: No (declined appt , will call back to schedule) MD Provider Line Number:[phone] Given: No [facility]  Follow-up appointment confirmed?: NA Do you need transportation to your follow-up appointment?: No Do you understand care options if your condition(s) worsen?: Yes-patient verbalized understanding    SIGNATURE Karena Addison, LPN [facility] Nurse Health Advisor Direct Dial [phone]

## 2023-10-22 ENCOUNTER — Telehealth: Payer: Self-pay

## 2023-10-22 DIAGNOSIS — J9602 Acute respiratory failure with hypercapnia: Secondary | ICD-10-CM | POA: Diagnosis not present

## 2023-10-22 DIAGNOSIS — G6181 Chronic inflammatory demyelinating polyneuritis: Secondary | ICD-10-CM | POA: Diagnosis not present

## 2023-10-22 DIAGNOSIS — F329 Major depressive disorder, single episode, unspecified: Secondary | ICD-10-CM | POA: Diagnosis not present

## 2023-10-22 DIAGNOSIS — I2510 Atherosclerotic heart disease of native coronary artery without angina pectoris: Secondary | ICD-10-CM | POA: Diagnosis not present

## 2023-10-22 DIAGNOSIS — I959 Hypotension, unspecified: Secondary | ICD-10-CM | POA: Diagnosis not present

## 2023-10-22 DIAGNOSIS — E039 Hypothyroidism, unspecified: Secondary | ICD-10-CM | POA: Diagnosis not present

## 2023-10-22 DIAGNOSIS — Z6838 Body mass index (BMI) 38.0-38.9, adult: Secondary | ICD-10-CM | POA: Diagnosis not present

## 2023-10-22 DIAGNOSIS — E559 Vitamin D deficiency, unspecified: Secondary | ICD-10-CM | POA: Diagnosis not present

## 2023-10-22 DIAGNOSIS — F5101 Primary insomnia: Secondary | ICD-10-CM | POA: Diagnosis not present

## 2023-10-22 DIAGNOSIS — Z794 Long term (current) use of insulin: Secondary | ICD-10-CM | POA: Diagnosis not present

## 2023-10-22 DIAGNOSIS — E1142 Type 2 diabetes mellitus with diabetic polyneuropathy: Secondary | ICD-10-CM | POA: Diagnosis not present

## 2023-10-22 DIAGNOSIS — J9601 Acute respiratory failure with hypoxia: Secondary | ICD-10-CM | POA: Diagnosis not present

## 2023-10-22 DIAGNOSIS — E538 Deficiency of other specified B group vitamins: Secondary | ICD-10-CM | POA: Diagnosis not present

## 2023-10-22 DIAGNOSIS — E11610 Type 2 diabetes mellitus with diabetic neuropathic arthropathy: Secondary | ICD-10-CM | POA: Diagnosis not present

## 2023-10-22 DIAGNOSIS — G894 Chronic pain syndrome: Secondary | ICD-10-CM | POA: Diagnosis not present

## 2023-10-22 DIAGNOSIS — M810 Age-related osteoporosis without current pathological fracture: Secondary | ICD-10-CM | POA: Diagnosis not present

## 2023-10-22 DIAGNOSIS — G4733 Obstructive sleep apnea (adult) (pediatric): Secondary | ICD-10-CM | POA: Diagnosis not present

## 2023-10-22 DIAGNOSIS — R339 Retention of urine, unspecified: Secondary | ICD-10-CM | POA: Diagnosis not present

## 2023-10-22 DIAGNOSIS — E1165 Type 2 diabetes mellitus with hyperglycemia: Secondary | ICD-10-CM | POA: Diagnosis not present

## 2023-10-22 DIAGNOSIS — I110 Hypertensive heart disease with heart failure: Secondary | ICD-10-CM | POA: Diagnosis not present

## 2023-10-22 DIAGNOSIS — E66812 Obesity, class 2: Secondary | ICD-10-CM | POA: Diagnosis not present

## 2023-10-22 DIAGNOSIS — I5032 Chronic diastolic (congestive) heart failure: Secondary | ICD-10-CM | POA: Diagnosis not present

## 2023-10-22 DIAGNOSIS — F419 Anxiety disorder, unspecified: Secondary | ICD-10-CM | POA: Diagnosis not present

## 2023-10-22 DIAGNOSIS — H3530 Unspecified macular degeneration: Secondary | ICD-10-CM | POA: Diagnosis not present

## 2023-10-22 DIAGNOSIS — J441 Chronic obstructive pulmonary disease with (acute) exacerbation: Secondary | ICD-10-CM | POA: Diagnosis not present

## 2023-10-22 NOTE — Telephone Encounter (Signed)
 OK for verbal order

## 2023-10-22 NOTE — Telephone Encounter (Signed)
 Copied from CRM [id number]. Topic: Clinical - Home Health Verbal Orders >> Oct 22, 2023  1:33 PM Gery Pray wrote: Caller/Agency:Kathleen Anderson/[facility] Callback Number: [phone] Confidential VM and messages can be left Service Requested: Physical Therapy Frequency: 1 week 1 2 week 2 1 week 6 Any new concerns about the patient? No

## 2023-10-22 NOTE — Telephone Encounter (Signed)
 Copied from CRM [id number]. Topic: Clinical - Home Health Verbal Orders >> Oct 22, 2023  1:38 PM Gery Pray wrote: Caller/Agency: [facility]  Callback Number: [phone] Confidential VM messages may be left Service Requested: Home Health Aid Bathing and ADL Frequency: 1 week 1 2 week 3 Any new concerns about the patient? No

## 2023-10-22 NOTE — Telephone Encounter (Signed)
 Ok for verbal order

## 2023-10-23 ENCOUNTER — Telehealth: Payer: Self-pay

## 2023-10-23 DIAGNOSIS — J441 Chronic obstructive pulmonary disease with (acute) exacerbation: Secondary | ICD-10-CM | POA: Diagnosis not present

## 2023-10-23 DIAGNOSIS — J9601 Acute respiratory failure with hypoxia: Secondary | ICD-10-CM | POA: Diagnosis not present

## 2023-10-23 DIAGNOSIS — I5032 Chronic diastolic (congestive) heart failure: Secondary | ICD-10-CM | POA: Diagnosis not present

## 2023-10-23 DIAGNOSIS — I110 Hypertensive heart disease with heart failure: Secondary | ICD-10-CM | POA: Diagnosis not present

## 2023-10-23 DIAGNOSIS — E1165 Type 2 diabetes mellitus with hyperglycemia: Secondary | ICD-10-CM | POA: Diagnosis not present

## 2023-10-23 DIAGNOSIS — E1142 Type 2 diabetes mellitus with diabetic polyneuropathy: Secondary | ICD-10-CM | POA: Diagnosis not present

## 2023-10-23 NOTE — Telephone Encounter (Signed)
 Called and given verbal orders approval.  Left detailed voice mail message on listed home #

## 2023-10-23 NOTE — Telephone Encounter (Signed)
 Task completed. Contacted Nicholos Johns @ [facility] at [phone]. Verbal orders provided for skilled nursing and physical therapy.

## 2023-10-23 NOTE — Telephone Encounter (Signed)
 Copied from CRM [id number]. Topic: Clinical - Home Health Verbal Orders >> Oct 22, 2023  1:36 PM Gery Pray wrote: Caller/Agency: [facility] Callback Number: [phone] Confidential VM messages may be left Service Requested: Skilled Nursing Frequency: 1 week 1 for the first week and they will call in for their separate orders Any new concerns about the patient? No

## 2023-10-24 ENCOUNTER — Telehealth: Payer: Self-pay

## 2023-10-24 NOTE — Telephone Encounter (Signed)
 Copied from CRM [id number]. Topic: Clinical - Home Health Verbal Orders >> Oct 24, 2023  2:03 PM Antwanette L wrote: Caller/Agency: Irving Burton from [facility] Callback Number: [phone] Service Requested: Occupational therOccupational Therapy Frequency: once a week for the next 8 weeks Any new concerns about the patient? No

## 2023-10-25 ENCOUNTER — Other Ambulatory Visit: Payer: Self-pay | Admitting: Physician Assistant

## 2023-10-25 DIAGNOSIS — E1142 Type 2 diabetes mellitus with diabetic polyneuropathy: Secondary | ICD-10-CM | POA: Diagnosis not present

## 2023-10-25 DIAGNOSIS — I110 Hypertensive heart disease with heart failure: Secondary | ICD-10-CM | POA: Diagnosis not present

## 2023-10-25 DIAGNOSIS — J441 Chronic obstructive pulmonary disease with (acute) exacerbation: Secondary | ICD-10-CM | POA: Diagnosis not present

## 2023-10-25 DIAGNOSIS — B370 Candidal stomatitis: Secondary | ICD-10-CM

## 2023-10-25 DIAGNOSIS — J9601 Acute respiratory failure with hypoxia: Secondary | ICD-10-CM | POA: Diagnosis not present

## 2023-10-25 DIAGNOSIS — E1165 Type 2 diabetes mellitus with hyperglycemia: Secondary | ICD-10-CM | POA: Diagnosis not present

## 2023-10-25 DIAGNOSIS — I5032 Chronic diastolic (congestive) heart failure: Secondary | ICD-10-CM | POA: Diagnosis not present

## 2023-10-29 NOTE — Telephone Encounter (Signed)
 Called and left detailed OT approval information on  confidential voice mail.

## 2023-10-30 ENCOUNTER — Ambulatory Visit (INDEPENDENT_AMBULATORY_CARE_PROVIDER_SITE_OTHER): Admitting: Family Medicine

## 2023-10-30 ENCOUNTER — Telehealth: Payer: Self-pay | Admitting: Family Medicine

## 2023-10-30 ENCOUNTER — Encounter: Payer: Self-pay | Admitting: Family Medicine

## 2023-10-30 VITALS — BP 92/73 | HR 96 | Ht 67.0 in | Wt 231.0 lb

## 2023-10-30 DIAGNOSIS — H548 Legal blindness, as defined in USA: Secondary | ICD-10-CM | POA: Insufficient documentation

## 2023-10-30 DIAGNOSIS — B372 Candidiasis of skin and nail: Secondary | ICD-10-CM | POA: Diagnosis not present

## 2023-10-30 DIAGNOSIS — Z794 Long term (current) use of insulin: Secondary | ICD-10-CM | POA: Diagnosis not present

## 2023-10-30 DIAGNOSIS — R21 Rash and other nonspecific skin eruption: Secondary | ICD-10-CM | POA: Diagnosis not present

## 2023-10-30 DIAGNOSIS — J441 Chronic obstructive pulmonary disease with (acute) exacerbation: Secondary | ICD-10-CM | POA: Diagnosis not present

## 2023-10-30 DIAGNOSIS — M47816 Spondylosis without myelopathy or radiculopathy, lumbar region: Secondary | ICD-10-CM

## 2023-10-30 DIAGNOSIS — G894 Chronic pain syndrome: Secondary | ICD-10-CM | POA: Diagnosis not present

## 2023-10-30 DIAGNOSIS — J4489 Other specified chronic obstructive pulmonary disease: Secondary | ICD-10-CM | POA: Diagnosis not present

## 2023-10-30 DIAGNOSIS — I5032 Chronic diastolic (congestive) heart failure: Secondary | ICD-10-CM | POA: Diagnosis not present

## 2023-10-30 DIAGNOSIS — N1832 Chronic kidney disease, stage 3b: Secondary | ICD-10-CM | POA: Diagnosis not present

## 2023-10-30 DIAGNOSIS — I110 Hypertensive heart disease with heart failure: Secondary | ICD-10-CM | POA: Diagnosis not present

## 2023-10-30 DIAGNOSIS — I878 Other specified disorders of veins: Secondary | ICD-10-CM

## 2023-10-30 DIAGNOSIS — G4733 Obstructive sleep apnea (adult) (pediatric): Secondary | ICD-10-CM

## 2023-10-30 DIAGNOSIS — G5603 Carpal tunnel syndrome, bilateral upper limbs: Secondary | ICD-10-CM | POA: Diagnosis not present

## 2023-10-30 DIAGNOSIS — E1142 Type 2 diabetes mellitus with diabetic polyneuropathy: Secondary | ICD-10-CM | POA: Diagnosis not present

## 2023-10-30 DIAGNOSIS — I951 Orthostatic hypotension: Secondary | ICD-10-CM

## 2023-10-30 DIAGNOSIS — E039 Hypothyroidism, unspecified: Secondary | ICD-10-CM

## 2023-10-30 DIAGNOSIS — Z23 Encounter for immunization: Secondary | ICD-10-CM

## 2023-10-30 DIAGNOSIS — E1165 Type 2 diabetes mellitus with hyperglycemia: Secondary | ICD-10-CM | POA: Diagnosis not present

## 2023-10-30 DIAGNOSIS — E1140 Type 2 diabetes mellitus with diabetic neuropathy, unspecified: Secondary | ICD-10-CM | POA: Diagnosis not present

## 2023-10-30 DIAGNOSIS — J9601 Acute respiratory failure with hypoxia: Secondary | ICD-10-CM | POA: Diagnosis not present

## 2023-10-30 MED ORDER — TRAZODONE HCL 50 MG PO TABS: 50.0000 mg | ORAL_TABLET | Freq: Every day | ORAL | 1 refills | Status: DC

## 2023-10-30 MED ORDER — NYSTATIN-TRIAMCINOLONE 100000-0.1 UNIT/GM-% EX OINT: TOPICAL_OINTMENT | Freq: Two times a day (BID) | CUTANEOUS | 1 refills | Status: DC | PRN

## 2023-10-30 MED ORDER — HYDROCODONE-ACETAMINOPHEN 5-325 MG PO TABS: 1.0000 | ORAL_TABLET | Freq: Every evening | ORAL | 0 refills | Status: DC | PRN

## 2023-10-30 NOTE — Assessment & Plan Note (Signed)
 She has switched back to her home metformin and Lantus.  She think she has enough medication at home.  Will plan to recheck A1c again in 3 months.

## 2023-10-30 NOTE — Progress Notes (Signed)
 Established Patient Office Visit  Subjective  Patient ID: [name], female    DOB: Dec 10, 1945  Age: 78 y.o. MRN: [id number]  Chief Complaint  Patient presents with   Hospitalization Follow-up    Copd     HPI  She is here today for hospital follow-up she was admitted in January on January 29 and discharged on 6 days later on February 4 she was discharged to a SNF and says she was discharged home about 2 weeks ago.  Struggling with swelling in both LEs.  She takes the lasix daily. Not wearing compression stockings.    Needs a refill on her antifungal cream that she uses in her groin area.     ROS    Objective:     BP 92/73   Pulse 96   Ht 5\' 7"  (1.702 m)   Wt 231 lb (104.8 kg)   SpO2 100% Comment: 2L  BMI 36.18 kg/m    Physical Exam Vitals reviewed.  Constitutional:      Appearance: Normal appearance.  HENT:     Head: Normocephalic.  Pulmonary:     Effort: Pulmonary effort is normal.  Neurological:     Mental Status: She is alert and oriented to person, place, and time.  Psychiatric:        Mood and Affect: Mood normal.        Behavior: Behavior normal.      Results for orders placed or performed in visit on 10/30/23  CBC with Differential/Platelet  Result Value Ref Range   WBC 7.2 3.4 - 10.8 x10E3/uL   RBC 4.19 3.77 - 5.28 x10E6/uL   Hemoglobin 12.9 11.1 - 15.9 g/dL   Hematocrit 19.1 47.8 - 46.6 %   MCV 96 79 - 97 fL   MCH 30.8 26.6 - 33.0 pg   MCHC 32.2 31.5 - 35.7 g/dL   RDW 29.5 62.1 - 30.8 %   Platelets 215 150 - 450 x10E3/uL   Neutrophils 69 Not Estab. %   Lymphs 19 Not Estab. %   Monocytes 10 Not Estab. %   Eos 1 Not Estab. %   Basos 1 Not Estab. %   Neutrophils Absolute 5.0 1.4 - 7.0 x10E3/uL   Lymphocytes Absolute 1.4 0.7 - 3.1 x10E3/uL   Monocytes Absolute 0.7 0.1 - 0.9 x10E3/uL   EOS (ABSOLUTE) 0.1 0.0 - 0.4 x10E3/uL   Basophils Absolute 0.0 0.0 - 0.2 x10E3/uL   Immature Granulocytes 0 Not Estab. %   Immature Grans (Abs)  0.0 0.0 - 0.1 x10E3/uL  CMP14+EGFR  Result Value Ref Range   Glucose 150 (H) 70 - 99 mg/dL   BUN 15 8 - 27 mg/dL   Creatinine, Ser 6.57 (H) 0.57 - 1.00 mg/dL   eGFR 46 (L) >84 ON/GEX/5.28   BUN/Creatinine Ratio 12 12 - 28   Sodium 142 134 - 144 mmol/L   Potassium 3.6 3.5 - 5.2 mmol/L   Chloride 95 (L) 96 - 106 mmol/L   CO2 29 20 - 29 mmol/L   Calcium 9.1 8.7 - 10.3 mg/dL   Total Protein 7.2 6.0 - 8.5 g/dL   Albumin 3.9 3.8 - 4.8 g/dL   Globulin, Total 3.3 1.5 - 4.5 g/dL   Bilirubin Total 0.9 0.0 - 1.2 mg/dL   Alkaline Phosphatase 79 44 - 121 IU/L   AST 10 0 - 40 IU/L   ALT 7 0 - 32 IU/L  Hemoglobin A1c  Result Value Ref Range   Hgb A1c MFr Bld  7.3 (H) 4.8 - 5.6 %   Est. average glucose Bld gHb Est-mCnc 163 mg/dL  TSH  Result Value Ref Range   TSH 16.700 (H) 0.450 - 4.500 uIU/mL      The ASCVD Risk score (Arnett DK, et al., 2019) failed to calculate for the following reasons:   Risk score cannot be calculated because patient has a medical history suggesting prior/existing ASCVD    Assessment & Plan:   Problem List Items Addressed This Visit       Cardiovascular and Mediastinum   Orthostatic hypotension   Currently on midodrine TID. BP still borderline low today.          Respiratory   OSA (obstructive sleep apnea)   Now has BiPap at home       COPD (chronic obstructive pulmonary disease) with chronic bronchitis (HCC)   Starting to wear BiPAP at night which I think is gena be really helpful.  She is dialed back her oxygen from 3 L to 2 L but I did encourage her to consider bumping up to 3 L especially when she is active.   Doing well with Trelegy daily.  Still has her nebulizer to use acutely if she has becomes sick or ill      Relevant Medications   promethazine (PHENERGAN) 25 MG tablet   Other Relevant Orders   CBC with Differential/Platelet (Completed)   CMP14+EGFR (Completed)   Hemoglobin A1c (Completed)   Ambulatory referral to Pulmonology      Endocrine   Hypothyroidism   He says she is taking her thyroid medication regularly.  Currently on 112 mcg daily.  Plan to recheck TSH today.      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  She think she has enough medication at home.  Will plan to recheck A1c again in 3 months.      Relevant Medications   blood glucose meter kit and supplies KIT   Other Relevant Orders   CBC with Differential/Platelet (Completed)   CMP14+EGFR (Completed)   Hemoglobin A1c (Completed)     Genitourinary   Chronic kidney disease (CKD) stage G3b/A2, moderately decreased glomerular filtration rate (GFR) between 30-44 mL/min/1.73 square meter and albuminuria creatinine ratio between 30-299 mg/g (HCC)   Check renal function today.        Other   Venous stasis   She is still taking her furosemide daily but struggling with the swelling.  She can go back to trying to wear compression stockings and or using Ace wraps if needed since she has difficulty with the stockings but it really would make a difference I discussed that the leaky valves are only getting be improved by the diuretic so much.  And her blood pressure is already low so it is a delicate balance between pulling too much fluid and having some chronic lower extremity swelling.      Lumbar facet joint syndrome   Is in the hospital they were giving her an occasional pain medication that she would just use occasionally sometimes the pain in her back legs and spine get severe enough that she needs something more than just Tylenol.  I did agree to give her a small quantity of hydrocodone to use sparingly but not regularly.      Relevant Medications   HYDROcodone-acetaminophen (NORCO/VICODIN) 5-325 MG tablet   Legally blind   Family has been  able to put button stickers on her BiPAP machine so hopefully that will help her be able to use it  consistently.      Chronic pain syndrome   Relevant Medications   traZODone (DESYREL) 50 MG tablet   HYDROcodone-acetaminophen (NORCO/VICODIN) 5-325 MG tablet   DULoxetine (CYMBALTA) 30 MG capsule   Other Visit Diagnoses       Rash       Relevant Medications   nystatin-triamcinolone ointment (MYCOLOG)     Yeast infection of the skin       Relevant Medications   nystatin-triamcinolone ointment (MYCOLOG)     Carpal tunnel syndrome on both sides       Relevant Medications   traZODone (DESYREL) 50 MG tablet   DULoxetine (CYMBALTA) 30 MG capsule     Encounter for immunization       Relevant Orders   Pneumococcal conjugate vaccine 20-valent (Completed)      He also wanted to know if she could get wrist braces fitted again the ones that she had are worn out and it does help with her pain.  Looking over her med list I think we can discontinue the Fosamax.  It looks like based on her historical med list she has been on it since 2013.  Return in about 6 weeks (around 12/11/2023) for COPD,etec .    Nani Gasser, MD

## 2023-10-30 NOTE — Telephone Encounter (Signed)
 Please call pharmacy and cancel any remaining refills on Ambien.

## 2023-10-30 NOTE — Assessment & Plan Note (Signed)
 Currently on midodrine TID. BP still borderline low today.

## 2023-10-30 NOTE — Telephone Encounter (Signed)
 In addition to canceling the Ambien please call patient and let her know that I am going to discontinue her Fosamax.  She has been on it long enough that she can take a "drug holiday".  So if she has some left she is welcome to finish it but after that she can discontinue.

## 2023-10-30 NOTE — Assessment & Plan Note (Signed)
 He says she is taking her thyroid medication regularly.  Currently on 112 mcg daily.  Plan to recheck TSH today.

## 2023-10-30 NOTE — Assessment & Plan Note (Signed)
 She is still taking her furosemide daily but struggling with the swelling.  She can go back to trying to wear compression stockings and or using Ace wraps if needed since she has difficulty with the stockings but it really would make a difference I discussed that the leaky valves are only getting be improved by the diuretic so much.  And her blood pressure is already low so it is a delicate balance between pulling too much fluid and having some chronic lower extremity swelling.

## 2023-10-30 NOTE — Assessment & Plan Note (Signed)
Check renal function today

## 2023-10-30 NOTE — Assessment & Plan Note (Signed)
 Is in the hospital they were giving her an occasional pain medication that she would just use occasionally sometimes the pain in her back legs and spine get severe enough that she needs something more than just Tylenol.  I did agree to give her a small quantity of hydrocodone to use sparingly but not regularly.

## 2023-10-30 NOTE — Assessment & Plan Note (Signed)
 Family has been able to put button stickers on her BiPAP machine so hopefully that will help her be able to use it consistently.

## 2023-10-30 NOTE — Assessment & Plan Note (Addendum)
 Starting to wear BiPAP at night which I think is gena be really helpful.  She is dialed back her oxygen from 3 L to 2 L but I did encourage her to consider bumping up to 3 L especially when she is active.   Doing well with Trelegy daily.  Still has her nebulizer to use acutely if she has becomes sick or ill

## 2023-10-30 NOTE — Assessment & Plan Note (Signed)
 Now has BiPap at home

## 2023-10-31 ENCOUNTER — Other Ambulatory Visit: Payer: Self-pay | Admitting: Family Medicine

## 2023-10-31 ENCOUNTER — Encounter: Payer: Self-pay | Admitting: Family Medicine

## 2023-10-31 DIAGNOSIS — E1142 Type 2 diabetes mellitus with diabetic polyneuropathy: Secondary | ICD-10-CM | POA: Diagnosis not present

## 2023-10-31 DIAGNOSIS — I110 Hypertensive heart disease with heart failure: Secondary | ICD-10-CM | POA: Diagnosis not present

## 2023-10-31 DIAGNOSIS — E1165 Type 2 diabetes mellitus with hyperglycemia: Secondary | ICD-10-CM | POA: Diagnosis not present

## 2023-10-31 DIAGNOSIS — J441 Chronic obstructive pulmonary disease with (acute) exacerbation: Secondary | ICD-10-CM | POA: Diagnosis not present

## 2023-10-31 DIAGNOSIS — I5032 Chronic diastolic (congestive) heart failure: Secondary | ICD-10-CM | POA: Diagnosis not present

## 2023-10-31 DIAGNOSIS — J9601 Acute respiratory failure with hypoxia: Secondary | ICD-10-CM | POA: Diagnosis not present

## 2023-10-31 LAB — CBC WITH DIFFERENTIAL/PLATELET
Basophils Absolute: 0 10*3/uL (ref 0.0–0.2)
Basos: 1 %
EOS (ABSOLUTE): 0.1 10*3/uL (ref 0.0–0.4)
Eos: 1 %
Hematocrit: 40.1 % (ref 34.0–46.6)
Hemoglobin: 12.9 g/dL (ref 11.1–15.9)
Immature Grans (Abs): 0 10*3/uL (ref 0.0–0.1)
Immature Granulocytes: 0 %
Lymphocytes Absolute: 1.4 10*3/uL (ref 0.7–3.1)
Lymphs: 19 %
MCH: 30.8 pg (ref 26.6–33.0)
MCHC: 32.2 g/dL (ref 31.5–35.7)
MCV: 96 fL (ref 79–97)
Monocytes Absolute: 0.7 10*3/uL (ref 0.1–0.9)
Monocytes: 10 %
Neutrophils Absolute: 5 10*3/uL (ref 1.4–7.0)
Neutrophils: 69 %
Platelets: 215 10*3/uL (ref 150–450)
RBC: 4.19 x10E6/uL (ref 3.77–5.28)
RDW: 14.8 % (ref 11.7–15.4)
WBC: 7.2 10*3/uL (ref 3.4–10.8)

## 2023-10-31 LAB — CMP14+EGFR
ALT: 7 IU/L (ref 0–32)
AST: 10 IU/L (ref 0–40)
Albumin: 3.9 g/dL (ref 3.8–4.8)
Alkaline Phosphatase: 79 IU/L (ref 44–121)
BUN/Creatinine Ratio: 12 (ref 12–28)
BUN: 15 mg/dL (ref 8–27)
Bilirubin Total: 0.9 mg/dL (ref 0.0–1.2)
CO2: 29 mmol/L (ref 20–29)
Calcium: 9.1 mg/dL (ref 8.7–10.3)
Chloride: 95 mmol/L — ABNORMAL LOW (ref 96–106)
Creatinine, Ser: 1.21 mg/dL — ABNORMAL HIGH (ref 0.57–1.00)
Globulin, Total: 3.3 g/dL (ref 1.5–4.5)
Glucose: 150 mg/dL — ABNORMAL HIGH (ref 70–99)
Potassium: 3.6 mmol/L (ref 3.5–5.2)
Sodium: 142 mmol/L (ref 134–144)
Total Protein: 7.2 g/dL (ref 6.0–8.5)
eGFR: 46 mL/min/{1.73_m2} — ABNORMAL LOW (ref >59)

## 2023-10-31 LAB — HEMOGLOBIN A1C
Est. average glucose Bld gHb Est-mCnc: 163 mg/dL
Hgb A1c MFr Bld: 7.3 % — ABNORMAL HIGH (ref 4.8–5.6)

## 2023-10-31 LAB — TSH: TSH: 16.7 u[IU]/mL — ABNORMAL HIGH (ref 0.450–4.500)

## 2023-10-31 MED ORDER — DULOXETINE HCL 30 MG PO CPEP: 60.0000 mg | ORAL_CAPSULE | Freq: Every day | ORAL | 1 refills | Status: DC

## 2023-10-31 MED ORDER — BLOOD GLUCOSE MONITOR KIT
PACK | 0 refills | Status: AC
Start: 2023-10-31 — End: ?

## 2023-10-31 NOTE — Addendum Note (Signed)
 Addended by: Nani Gasser D on: 10/31/2023 03:18 PM   Modules accepted: Orders

## 2023-10-31 NOTE — Progress Notes (Signed)
 Hi [name], your thyroid level is definitely elevated please just verify with me what dose you are taking.  I wonder if they may be worth giving it to you while you are in the hospital also just let me know how long you have been back on it.  I may need to make an adjustment.  Your kidney function is stable at 1.2.  Liver functions normal.  Your A1c is elevated at 7.3.  But working to work on getting that back under control.  Blood count is normal.  He is continue the midodrine for now it is helping to keep your blood pressure up.

## 2023-10-31 NOTE — Telephone Encounter (Signed)
 Spoke w/[name] and asked that she d/c any remaining refills of Ambien.  Called and informed Ms. Helderman of this. She voiced understanding and agreed.

## 2023-10-31 NOTE — Progress Notes (Signed)
 No outpatient medications have been marked as taking for the 10/31/23 encounter (Orders Only) with Agapito Games, MD.

## 2023-11-05 DIAGNOSIS — I5032 Chronic diastolic (congestive) heart failure: Secondary | ICD-10-CM | POA: Diagnosis not present

## 2023-11-05 DIAGNOSIS — J441 Chronic obstructive pulmonary disease with (acute) exacerbation: Secondary | ICD-10-CM | POA: Diagnosis not present

## 2023-11-05 DIAGNOSIS — E1165 Type 2 diabetes mellitus with hyperglycemia: Secondary | ICD-10-CM | POA: Diagnosis not present

## 2023-11-05 DIAGNOSIS — E1142 Type 2 diabetes mellitus with diabetic polyneuropathy: Secondary | ICD-10-CM | POA: Diagnosis not present

## 2023-11-05 DIAGNOSIS — J9601 Acute respiratory failure with hypoxia: Secondary | ICD-10-CM | POA: Diagnosis not present

## 2023-11-05 DIAGNOSIS — I110 Hypertensive heart disease with heart failure: Secondary | ICD-10-CM | POA: Diagnosis not present

## 2023-11-06 ENCOUNTER — Ambulatory Visit: Admitting: Family Medicine

## 2023-11-07 DIAGNOSIS — E1142 Type 2 diabetes mellitus with diabetic polyneuropathy: Secondary | ICD-10-CM | POA: Diagnosis not present

## 2023-11-07 DIAGNOSIS — I5032 Chronic diastolic (congestive) heart failure: Secondary | ICD-10-CM | POA: Diagnosis not present

## 2023-11-07 DIAGNOSIS — J441 Chronic obstructive pulmonary disease with (acute) exacerbation: Secondary | ICD-10-CM | POA: Diagnosis not present

## 2023-11-07 DIAGNOSIS — J9601 Acute respiratory failure with hypoxia: Secondary | ICD-10-CM | POA: Diagnosis not present

## 2023-11-07 DIAGNOSIS — I110 Hypertensive heart disease with heart failure: Secondary | ICD-10-CM | POA: Diagnosis not present

## 2023-11-07 DIAGNOSIS — E1165 Type 2 diabetes mellitus with hyperglycemia: Secondary | ICD-10-CM | POA: Diagnosis not present

## 2023-11-08 ENCOUNTER — Ambulatory Visit

## 2023-11-08 VITALS — Ht 67.0 in | Wt 231.0 lb

## 2023-11-08 DIAGNOSIS — Z0000 Encounter for general adult medical examination without abnormal findings: Secondary | ICD-10-CM | POA: Diagnosis not present

## 2023-11-08 NOTE — Progress Notes (Signed)
 Subjective:   [name] is a 78 y.o. female who presents for Medicare Annual (Subsequent) preventive examination.  Visit Complete: Virtual I connected with  Hallie Ertl on 11/08/23 by a audio enabled telemedicine application and verified that I am speaking with the correct person using two identifiers.  Patient Location: Home  Provider Location: Office/Clinic  I discussed the limitations of evaluation and management by telemedicine. The patient expressed understanding and agreed to proceed.  Vital Signs: Because this visit was a virtual/telehealth visit, some criteria may be missing or patient reported. Any vitals not documented were not able to be obtained and vitals that have been documented are patient reported.  Patient Medicare AWV questionnaire was completed by the patient on n/a; I have confirmed that all information answered by patient is correct and no changes since this date.  Cardiac Risk Factors include: advanced age (>57men, >66 women);diabetes mellitus;sedentary lifestyle;smoking/ tobacco exposure;obesity (BMI >30kg/m2);dyslipidemia     Objective:    Today's Vitals   11/08/23 1346 11/08/23 1347  Weight: 231 lb (104.8 kg)   Height: 5\' 7"  (1.702 m)   PainSc:  3    Body mass index is 36.18 kg/m.     11/08/2023    2:03 PM 10/30/2022    1:06 PM 10/24/2021    1:16 PM 10/22/2020    3:15 PM 09/17/2015    9:45 AM 09/11/2014   10:43 AM 01/14/2014   11:13 AM  Advanced Directives  Does Patient Have a Medical Advance Directive? Yes Yes Yes Yes Yes Yes Patient has advance directive, copy not in chart  Type of Advance Directive Healthcare Power of [place];Living will Living will Living will;Healthcare Power of [employer] Power of [place];Living will Healthcare Power of [place];Living will Healthcare Power of [place];Living will Healthcare Power of [place];Living will  Does patient want to make changes to medical advance directive?  No - Patient declined No -  Patient declined No - Patient declined No - Patient declined    Copy of Healthcare Power of Attorney in Chart? No - copy requested  No - copy requested No - copy requested No - copy requested No - copy requested   Pre-existing out of facility DNR order (yellow form or pink MOST form)       Yellow form placed in chart (order not valid for inpatient use)    Current Medications (verified) Outpatient Encounter Medications as of 11/08/2023  Medication Sig   albuterol (VENTOLIN HFA) 108 (90 Base) MCG/ACT inhaler Inhale 2 puffs into the lungs every 6 (six) hours as needed for wheezing or shortness of breath. DX:J44.1   AMBULATORY NON FORMULARY MEDICATION Medication Name: Flutter valve for COPD   AMBULATORY NON FORMULARY MEDICATION Medication Name: Nebulizer for severe COPD.   Please deliver this week.   aspirin EC 81 MG tablet Take 81 mg by mouth daily.   blood glucose meter kit and supplies KIT Needs voice active that insurance preference. Use up to four times daily as directed Dx E11.40. Possibly Pridgy?   calcium carbonate (OS-CAL) 600 MG tablet Take by mouth.   dicyclomine (BENTYL) 20 MG tablet TAKE ONE TABLET BY MOUTH THREE TIMES DAILY AS NEEDED FOR SPASMS   diphenoxylate-atropine (LOMOTIL) 2.5-0.025 MG tablet TAKE ONE OR TWO TABLETS BY MOUTH TWICE DAILY AS NEEDED FOR DIARRHEA OR LOOSE STOOLS   DULoxetine (CYMBALTA) 30 MG capsule Take 2 capsules (60 mg total) by mouth daily.   Fluticasone-Umeclidin-Vilant (TRELEGY ELLIPTA) 100-62.5-25 MCG/ACT AEPB Inhale 1 puff into the lungs daily.  furosemide (LASIX) 40 MG tablet Take 1-2 tablets (40-80 mg total) by mouth daily.   GAMUNEX-C 10 GM/100ML SOLN    HYDROcodone-acetaminophen (NORCO/VICODIN) 5-325 MG tablet Take 1 tablet by mouth at bedtime as needed for moderate pain (pain score 4-6).   insulin glargine (LANTUS SOLOSTAR) 100 UNIT/ML Solostar Pen Inject 45-60 Units into the skin at bedtime.   ipratropium-albuterol (DUONEB) 0.5-2.5 (3) MG/3ML SOLN  Take 3 mLs by nebulization every 4 (four) hours as needed.   levothyroxine (SYNTHROID) 112 MCG tablet Take 1 tablet (112 mcg total) by mouth daily. Needs labs   lidocaine (XYLOCAINE) 5 % ointment Apply 1 application topically daily. 2 hours before skin procedure.   meclizine (ANTIVERT) 25 MG tablet Take 1 tablet (25 mg total) by mouth 3 (three) times daily as needed for dizziness.   Melatonin 12 MG TABS Take 12 mg by mouth at bedtime.   midodrine (PROAMATINE) 5 MG tablet Take 5 mg by mouth 3 (three) times daily.   Multiple Vitamins-Minerals (PRESERVISION AREDS 2) CAPS    mycophenolate (CELLCEPT) 500 MG tablet Take by mouth.   nystatin-triamcinolone ointment (MYCOLOG) Apply topically 2 (two) times daily as needed.   promethazine (PHENERGAN) 25 MG tablet Take 25 mg by mouth every 8 (eight) hours as needed for nausea or vomiting.   simvastatin (ZOCOR) 80 MG tablet TAKE ONE TABLET BY MOUTH EVERY DAY   traZODone (DESYREL) 50 MG tablet Take 1 tablet (50 mg total) by mouth at bedtime.   No facility-administered encounter medications on file as of 11/08/2023.    Allergies (verified) Varenicline   History: Past Medical History:  Diagnosis Date   Arthritis    Charcot-Marie-Tooth disease    COPD (chronic obstructive pulmonary disease) (HCC)    DDD (degenerative disc disease)    Lumbar and lumbosacral   Depression    Diabetes mellitus    type 2   Diabetic peripheral neuropathy (HCC)    Facet syndrome, lumbar    Gastric ulcer    w/o hemorrhage   Hyperlipidemia    Hypertension    Insomnia    Lumbosacral root lesions, not elsewhere classified    Neurogenic bladder    Obesity    Osteopenia    Other symptoms referable to [name]    Post-menopausal    PUD (peptic ulcer disease)    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   Spinal stenosis, lumbar region, without neurogenic claudication    Thoracic spondylosis without myelopathy    Thyroid disease    hypo   Tobacco dependence     Unspecified hereditary and idiopathic peripheral neuropathy    Past Surgical History:  Procedure Laterality Date   ANKLE RECONSTRUCTION     left ankle, plate and pins    bilateral L3 medial branch block     CHOLECYSTECTOMY     fibroid tumor removal     fluoroscopic L5 dorsal medial branch bloc     RENAL ARTERY STENT  11/28/2010   left   REPLACEMENT TOTAL KNEE     Family History  Problem Relation Age of Onset   Stroke Mother    Heart disease Father 22       heart attack   Hypertension Father    Cancer Father        lung   Diabetes Paternal Aunt        insulin dependent   Social History   Socioeconomic History   Marital status: Widowed    Spouse name: Not  on file   Number of children: 2   Years of education: 79   Highest education level: Some college, no degree  Occupational History   Occupation: Retired  Tobacco Use   Smoking status: Former    Average packs/day: 1 pack/day for 62.5 years (62.5 ttl pk-yrs)    Types: Cigarettes    Start date: 01/08/1961   Smokeless tobacco: Never   Tobacco comments:    1 pack   Vaping Use   Vaping status: Never Used  Substance and Sexual Activity   Alcohol use: No    Alcohol/week: 0.0 standard drinks of alcohol   Drug use: No   Sexual activity: Not Currently  Other Topics Concern   Not on file  Social History Narrative   Lives alone but her daughter lives close by and helps when she can. She is not able to read any more due to macular degeneration but her girlfriend reads to her daily.    Social Drivers of Corporate investment banker Strain: Low Risk  (11/08/2023)   Overall Financial Resource Strain (CARDIA)    Difficulty of Paying Living Expenses: Not hard at all  Food Insecurity: No Food Insecurity (11/08/2023)   Hunger Vital Sign    Worried About Running Out of Food in the Last Year: Never true    Ran Out of Food in the Last Year: Never true  Transportation Needs: No Transportation Needs (11/08/2023)   PRAPARE -  Administrator, Civil Service (Medical): No    Lack of Transportation (Non-Medical): No  Physical Activity: Insufficiently Active (11/08/2023)   Exercise Vital Sign    Days of Exercise per Week: 7 days    Minutes of Exercise per Session: 10 min  Stress: No Stress Concern Present (11/08/2023)   [employer] of Occupational Health - Occupational Stress Questionnaire    Feeling of Stress : Not at all  Social Connections: Moderately Integrated (11/08/2023)   Social Connection and Isolation Panel [NHANES]    Frequency of Communication with Friends and Family: More than three times a week    Frequency of Social Gatherings with Friends and Family: More than three times a week    Attends Religious Services: More than 4 times per year    Active Member of [employer] or Organizations: Yes    Attends Banker Meetings: More than 4 times per year    Marital Status: Widowed    Tobacco Counseling Counseling given: Not Answered Tobacco comments: 1 pack    Clinical Intake:  Pre-visit preparation completed: Yes  Pain : 0-10 Pain Score: 3  Pain Type: Chronic pain Pain Location: [name] Pain Descriptors / Indicators: Aching, Constant Pain Onset: More than a month ago Pain Frequency: Constant Pain Relieving Factors: pain medication  Pain Relieving Factors: pain medication  BMI - recorded: 36.18 Nutritional Status: BMI > 30  Obese Nutritional Risks: None Diabetes: Yes CBG done?: Yes (173 mg/dl) CBG resulted in Enter/ Edit results?: No Did pt. bring in CBG monitor from home?: No  What is the last grade level you completed in school?: 13  Interpreter Needed?: No      Activities of Daily Living    11/08/2023    1:51 PM  In your present state of health, do you have any difficulty performing the following activities:  Hearing? 0  Vision? 1  Difficulty concentrating or making decisions? 0  Walking or climbing stairs? 1  Dressing or bathing? 1  Comment She can bath  herself but  like someone there just in case  Preparing Food and eating ? N  Using the Toilet? N  In the past six months, have you accidently leaked urine? Y  Do you have problems with loss of bowel control? N  Managing your Medications? N  Managing your Finances? N  Housekeeping or managing your Housekeeping? Y  Comment Programmer, applications    Patient Care Team: Agapito Games, MD as PCP - General (Family Medicine) Lenord Carbo., MD as Referring Physician (Psychiatry) Burtis Junes, MD as Referring Physician (Pain Medicine)  Indicate any recent Medical Services you may have received from other than [facility] providers in the past year (date may be approximate).     Assessment:   This is a routine wellness examination for [name].  Hearing/Vision screen No results found.   Goals Addressed             This Visit's Progress    Healthy Nutrition Achieved       She would like to try eating less.       Depression Screen    11/08/2023    2:01 PM 12/25/2022    1:24 PM 10/30/2022    1:07 PM 09/22/2022    1:55 PM 10/24/2021    1:03 PM 09/02/2021   11:54 AM 08/30/2021    9:58 AM  PHQ 2/9 Scores  PHQ - 2 Score 0 3 0 0 0 0 0  PHQ- 9 Score  9 0 3       Fall Risk    11/08/2023    2:04 PM 12/25/2022    1:24 PM 10/30/2022    1:07 PM 09/22/2022    1:54 PM 10/24/2021    1:03 PM  Fall Risk   Falls in the past year? 0 1 0 0 0  Number falls in past yr: 0 0 0 0 0  Injury with Fall? 0 0 0 0 0  Risk for fall due to : Impaired mobility;Impaired balance/gait Impaired balance/gait Impaired mobility;Impaired vision;Impaired balance/gait Impaired balance/gait;Impaired mobility No Fall Risks  Follow up Falls evaluation completed Falls evaluation completed Falls evaluation completed;Education provided;Falls prevention discussed Falls prevention discussed;Falls evaluation completed Falls evaluation completed    MEDICARE RISK AT HOME: Medicare Risk at Home Any stairs in or around the home?:  No Home free of loose throw rugs in walkways, pet beds, electrical cords, etc?: Yes Adequate lighting in your home to reduce risk of falls?: Yes Life alert?: No Use of a cane, walker or w/c?: Yes Grab bars in the bathroom?: Yes Shower chair or bench in shower?: Yes Elevated toilet seat or a handicapped toilet?: Yes  TIMED UP AND GO:  Was the test performed?  No    Cognitive Function:        11/08/2023    2:05 PM 10/30/2022    1:14 PM 10/24/2021    1:14 PM 10/22/2020    3:30 PM  6CIT Screen  What Year? 0 points 0 points 0 points 4 points  What month? 0 points 0 points 0 points 0 points  What time? 0 points 0 points 0 points 0 points  Count [name] from 20 0 points 0 points 0 points 0 points  Months in reverse 0 points 0 points 0 points 0 points  Repeat phrase 0 points 0 points 0 points 0 points  Total Score 0 points 0 points 0 points 4 points    Immunizations Immunization History  Administered Date(s) Administered   Fluad Quad(high Dose 65+) 05/02/2019, 06/13/2021, 05/15/2022  Fluad Trivalent(High Dose 65+) 06/14/2023   H1N1 06/19/2008   Influenza Split 05/06/2012   Influenza Whole 05/28/2006, 06/13/2007, 05/28/2008, 07/05/2009, 05/06/2010, 04/24/2011   Influenza, High Dose Seasonal PF 05/09/2018, 05/02/2019   Influenza,inj,Quad PF,6+ Mos 05/07/2013, 03/27/2014, 05/03/2015, 04/03/2016   Influenza-Unspecified 05/06/2012, 05/07/2013, 03/27/2014, 05/03/2015, 04/03/2016, 05/06/2020   Janssen (J&J) SARS-COV-2 Vaccination 10/31/2019   PNEUMOCOCCAL CONJUGATE-20 10/30/2023   PPD Test 09/18/2023, 09/28/2023   Pneumococcal Conjugate-13 04/27/2014   Pneumococcal Polysaccharide-23 06/07/2001, 08/15/2006, 01/09/2012   Tdap 10/06/2011    TDAP status: Due, Education has been provided regarding the importance of this vaccine. Advised may receive this vaccine at local pharmacy or Health Dept. Aware to provide a copy of the vaccination record if obtained from local pharmacy or Health Dept.  Verbalized acceptance and understanding.  Flu Vaccine status: Up to date  Pneumococcal vaccine status: Up to date  Covid-19 vaccine status: Declined, Education has been provided regarding the importance of this vaccine but patient still declined. Advised may receive this vaccine at local pharmacy or Health Dept.or vaccine clinic. Aware to provide a copy of the vaccination record if obtained from local pharmacy or Health Dept. Verbalized acceptance and understanding.  Qualifies for Shingles Vaccine? Yes   Zostavax completed No   Shingrix Completed?: No.    Education has been provided regarding the importance of this vaccine. Patient has been advised to call insurance company to determine out of pocket expense if they have not yet received this vaccine. Advised may also receive vaccine at local pharmacy or Health Dept. Verbalized acceptance and understanding.  Screening Tests Health Maintenance  Topic Date Due   Lung Cancer Screening  09/12/2013   Diabetic kidney evaluation - Urine ACR  12/05/2017   COVID-19 Vaccine (2 - Janssen risk series) 11/28/2019   DTaP/Tdap/Td (2 - Td or Tdap) 10/05/2021   OPHTHALMOLOGY EXAM  12/12/2023 (Originally 03/10/2021)   Zoster Vaccines- Shingrix (1 of 2) 12/21/2023 (Originally 08/22/1964)   FOOT EXAM  12/25/2023   INFLUENZA VACCINE  03/07/2024   HEMOGLOBIN A1C  05/01/2024   Diabetic kidney evaluation - eGFR measurement  10/29/2024   Medicare Annual Wellness (AWV)  11/07/2024   Pneumonia Vaccine 108+ Years old  Completed   DEXA SCAN  Completed   Hepatitis C Screening  Completed   HPV VACCINES  Aged Out   Colonoscopy  Discontinued    Health Maintenance  Health Maintenance Due  Topic Date Due   Lung Cancer Screening  09/12/2013   Diabetic kidney evaluation - Urine ACR  12/05/2017   COVID-19 Vaccine (2 - Janssen risk series) 11/28/2019   DTaP/Tdap/Td (2 - Td or Tdap) 10/05/2021    Colorectal cancer screening: No longer required.   Mammogram status:  No longer required due to age.  Bone Density status: Completed 04/15/2012. Results reflect: Bone density results: OSTEOPOROSIS. Repeat every 2 years.  Lung Cancer Screening: (Low Dose CT Chest recommended if Age 39-80 years, 20 pack-year currently smoking OR have quit w/in 15years.) does not qualify.   Lung Cancer Screening Referral: patient declined  Additional Screening:  Hepatitis C Screening: does not qualify; Completed 09/01/2015  Vision Screening: Recommended annual ophthalmology exams for early detection of glaucoma and other disorders of the eye. Is the patient up to date with their annual eye exam?  Yes  Who is the provider or what is the name of the office in which the patient attends annual eye exams? [facility]  If pt is not established with a provider, would they like to be  referred to a provider to establish care?  N/a .   Dental Screening: Recommended annual dental exams for proper oral hygiene  Diabetic Foot Exam: Diabetic Foot Exam: Completed 12/25/2022  Community Resource Referral / Chronic Care Management: CRR required this visit?  No   CCM required this visit?  No     Plan:     I have personally reviewed and noted the following in the patient's chart:   Medical and social history Use of alcohol, tobacco or illicit drugs  Current medications and supplements including opioid prescriptions. Patient is currently taking opioid prescriptions. Information provided to patient regarding non-opioid alternatives. Patient advised to discuss non-opioid treatment plan with their provider. Functional ability and status Nutritional status Physical activity Advanced directives List of other physicians Hospitalizations, surgeries, and ER visits in previous 12 months Hospitalized #4 times Vitals Screenings to include cognitive, depression, and falls Referrals and appointments  In addition, I have reviewed and discussed with patient certain preventive  protocols, quality metrics, and best practice recommendations. A written personalized care plan for preventive services as well as general preventive health recommendations were provided to patient.     Esmond Harps, CMA   11/08/2023   After Visit Summary: (MyChart) Due to this being a telephonic visit, the after visit summary with patients personalized plan was offered to patient via MyChart   Nurse Notes:   [name] is a 78 y.o. female patient of Metheney, Barbarann Ehlers, MD who had a Medicare Annual Wellness Visit today via telephone. [name] is Retired and lives alone. she has 2 children. She reports that she is socially active and does interact with friends/family regularly. She is minimally physically active and enjoys listening to her friend reading books as she is not able to read anymore due to macular degeneration.

## 2023-11-08 NOTE — Patient Instructions (Signed)
  [name] , Thank you for taking time to come for your Medicare Wellness Visit. I appreciate your ongoing commitment to your health goals. Please review the following plan we discussed and let me know if I can assist you in the future.   These are the goals we discussed:  Goals       Care Coordination Activities No Follow up Required by LCSW      Care Coordination Interventions: Reviewed Care Coordination Services:interested Assessed Social Determinants of Health Made referral to RN Care Coordinator : for assistance with managing diabetes          Healthy Nutrition Achieved      She would like to try eating less.       Patient Stated (pt-stated)      10/22/2020 AWV Goal: Improved Nutrition/Diet  Patient will verbalize understanding that diet plays an important role in overall health and that a poor diet is a risk factor for many chronic medical conditions.  Over the next year, patient will improve self management of their diet by incorporating more water throughout the day. Patient will utilize available community resources to help with food acquisition if needed (ex: food pantries, [address], etc) Patient will work with nutrition specialist if a referral was made       Patient Stated (pt-stated)      Would like to have a good day where she wasn't in pain all the time due to her arthritis      Patient Stated (pt-stated)      Patient would like to be more active when the weather is nicer.        This is a list of the screening recommended for you and due dates:  Health Maintenance  Topic Date Due   Screening for Lung Cancer  09/12/2013   Yearly kidney health urinalysis for diabetes  12/05/2017   COVID-19 Vaccine (2 - Janssen risk series) 11/28/2019   DTaP/Tdap/Td vaccine (2 - Td or Tdap) 10/05/2021   Eye exam for diabetics  12/12/2023*   Zoster (Shingles) Vaccine (1 of 2) 12/21/2023*   Complete foot exam   12/25/2023   Flu Shot  03/07/2024   Hemoglobin A1C  05/01/2024    Yearly kidney function blood test for diabetes  10/29/2024   Medicare Annual Wellness Visit  11/07/2024   Pneumonia Vaccine  Completed   DEXA scan (bone density measurement)  Completed   Hepatitis C Screening  Completed   HPV Vaccine  Aged Out   Colon Cancer Screening  Discontinued  *Topic was postponed. The date shown is not the original due date.

## 2023-11-09 ENCOUNTER — Other Ambulatory Visit: Payer: Self-pay | Admitting: Family Medicine

## 2023-11-09 ENCOUNTER — Ambulatory Visit: Admitting: Podiatry

## 2023-11-09 DIAGNOSIS — E1142 Type 2 diabetes mellitus with diabetic polyneuropathy: Secondary | ICD-10-CM

## 2023-11-09 DIAGNOSIS — B351 Tinea unguium: Secondary | ICD-10-CM | POA: Diagnosis not present

## 2023-11-09 DIAGNOSIS — M79675 Pain in left toe(s): Secondary | ICD-10-CM

## 2023-11-09 DIAGNOSIS — M79674 Pain in right toe(s): Secondary | ICD-10-CM | POA: Diagnosis not present

## 2023-11-09 DIAGNOSIS — G894 Chronic pain syndrome: Secondary | ICD-10-CM

## 2023-11-09 DIAGNOSIS — M47816 Spondylosis without myelopathy or radiculopathy, lumbar region: Secondary | ICD-10-CM

## 2023-11-09 NOTE — Progress Notes (Signed)
  Subjective:  Patient ID: [name], female    DOB: 1945-11-13,   MRN: [id number]  Chief Complaint  Patient presents with   Nail Problem    PT PRESENTS FOR [facility]    78 y.o. female presents for concern of thickened elongated and painful nails that are difficult to trim. Requesting to have them trimmed today. Denies any burning or tingling in her feet. Patient is diabetic and last A1c was 6.7.  . Denies any other pedal complaints. Denies n/v/f/c.   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 CFT <3 seconds. Absent hair growth on digits. Edema noted to bilateral lower extremities. Xerosis noted bilaterally.  Skin. No lacerations or abrasions bilateral feet. Nails 1-5 bilateral  are thickened discolored and elongated with subungual debris. Maceration noted in 3rd and fourth interspaced bilateral.  Musculoskeletal: MMT 5/5 bilateral lower extremities in DF, PF, Inversion and Eversion. Deceased ROM in DF of ankle joint.  Neurological: Sensation intact to light touch. Protective  sensation diminished bilateral.    Assessment:   1. Pain due to onychomycosis of toenails of both feet   2. Type 2 diabetes mellitus with diabetic polyneuropathy, unspecified whether long term insulin use (HCC)        Plan:  Patient was evaluated and treated and all questions answered. -Discussed and educated patient on diabetic foot care, especially with  regards to the vascular, neurological and musculoskeletal systems.  -Stressed the importance of good glycemic control and the detriment of not  controlling glucose levels in relation to the foot. -Discussed supportive shoes at all times and checking feet regularly.  -Mechanically debrided all nails 1-5 bilateral using sterile nail nipper and filed with dremel without incident  -Awaiting diabetic shoes.  -Answered all patient questions -Patient to return  in 3 months for at risk foot care -Patient advised to call the office if any problems or questions arise in the meantime.    Louann Sjogren, DPM

## 2023-11-09 NOTE — Telephone Encounter (Signed)
 Copied from CRM [id number]. Topic: Clinical - Medication Refill >> Nov 09, 2023 11:49 AM Louie Boston wrote: Most Recent Primary Care Visit:  Provider: Chalmers Cater  Department: [facility]  Visit Type: MEDICARE AWV, SEQUENTIAL  Date: 11/08/2023  Medication: HYDROcodone-acetaminophen (NORCO/VICODIN) 5-325 MG tablet  Has the patient contacted their pharmacy? Yes (Agent: If no, request that the patient contact the pharmacy for the refill. If patient does not wish to contact the pharmacy document the reason why and proceed with request.) (Agent: If yes, when and what did the pharmacy advise?)  Is this the correct pharmacy for this prescription? Yes If no, delete pharmacy and type the correct one.  This is the patient's preferred pharmacy:  [facility] [place], [place] - [address] [place] [address] [place] [address] Phone: [phone] Fax: [phone]   Has the prescription been filled recently? No  Is the patient out of the medication? Yes  Has the patient been seen for an appointment in the last year OR does the patient have an upcoming appointment? Yes  Can we respond through MyChart? Yes  Agent: Please be advised that Rx refills may take up to 3 business days. We ask that you follow-up with your pharmacy.

## 2023-11-09 NOTE — Progress Notes (Signed)
 Okay, increase to 2 tabs on Sundays and 1 tab the other 6 days.  Repeat this regimen and we will plan to recheck TSH in 6 to 8 weeks.

## 2023-11-12 NOTE — Telephone Encounter (Signed)
 It looks like med was refilled 1 week ago but request was still showing up in refill request list. Routing to Dr. Linford Arnold.

## 2023-11-13 ENCOUNTER — Ambulatory Visit: Payer: Self-pay

## 2023-11-13 ENCOUNTER — Other Ambulatory Visit: Payer: Self-pay | Admitting: Family Medicine

## 2023-11-13 NOTE — Telephone Encounter (Signed)
 Patient refused NT. Routing for follow-up.  Copied from CRM [id number]. Topic: Clinical - Red Word Triage >> Nov 13, 2023 11:42 AM Philippa Chester F wrote: [facility] that prompted transfer to Nurse Triage: numbness in feet; swelling in feet; extreme pain in legs  Patient stated she experiencing this level of neuropathy in her legs, feet and back all the time and refused to be transferred to a nurse.   Patient is having some issues regarding receiving her pain medication HYDROcodone-acetaminophen (NORCO/VICODIN) 5-325 MG tablet.   Please review her chart and give patient a call back to assist if necessary.  Phone: [phone]

## 2023-11-13 NOTE — Telephone Encounter (Signed)
 I would recommend that we call the patient and find out what the issue is?

## 2023-11-14 ENCOUNTER — Other Ambulatory Visit: Payer: Self-pay

## 2023-11-14 ENCOUNTER — Other Ambulatory Visit: Payer: Self-pay | Admitting: Family Medicine

## 2023-11-14 ENCOUNTER — Telehealth: Payer: Self-pay | Admitting: Family Medicine

## 2023-11-14 DIAGNOSIS — E1142 Type 2 diabetes mellitus with diabetic polyneuropathy: Secondary | ICD-10-CM | POA: Diagnosis not present

## 2023-11-14 DIAGNOSIS — E1165 Type 2 diabetes mellitus with hyperglycemia: Secondary | ICD-10-CM | POA: Diagnosis not present

## 2023-11-14 DIAGNOSIS — J441 Chronic obstructive pulmonary disease with (acute) exacerbation: Secondary | ICD-10-CM | POA: Diagnosis not present

## 2023-11-14 DIAGNOSIS — I5032 Chronic diastolic (congestive) heart failure: Secondary | ICD-10-CM | POA: Diagnosis not present

## 2023-11-14 DIAGNOSIS — I110 Hypertensive heart disease with heart failure: Secondary | ICD-10-CM | POA: Diagnosis not present

## 2023-11-14 DIAGNOSIS — J9601 Acute respiratory failure with hypoxia: Secondary | ICD-10-CM | POA: Diagnosis not present

## 2023-11-14 MED ORDER — LEVOTHYROXINE SODIUM 112 MCG PO TABS: ORAL_TABLET | ORAL | 0 refills | Status: DC

## 2023-11-14 NOTE — Telephone Encounter (Signed)
 That is why I sent in hydrocodone when I saw her.  So I am confused. ??????

## 2023-11-14 NOTE — Telephone Encounter (Signed)
 For nerve pain we usually use gabapentin and lyrica.

## 2023-11-14 NOTE — Telephone Encounter (Signed)
 For nerve pain we usually use gabapentin and lyrica.   . Is she ok with one of these.

## 2023-11-14 NOTE — Telephone Encounter (Signed)
 [name] states she has tried gabapentin and Lyrica in the past and it didn't help. She states she would like hydrocodone to help ease the pain.

## 2023-11-14 NOTE — Telephone Encounter (Signed)
 Please send pulmonary referral to an office in [place].

## 2023-11-14 NOTE — Telephone Encounter (Signed)
 Per patient, she has spoken to [place] on her preference of the nerve medication.

## 2023-11-14 NOTE — Telephone Encounter (Signed)
 Referral, clinical notes, demographics and copies of insurance cards have been faxed to [facility] at [phone]. Office will contact patient to schedule referral appointment.

## 2023-11-14 NOTE — Telephone Encounter (Signed)
 Called pt for clarification.  She stated that she was only given #7 Hydrocodone-acetaminophen. She said that she got this when she was sent home from rehab.  She said that she believed that they wanted to give her Oxycodone but she doesn't want to take the Oxycodone because of fear of addiction to that particular medication.   She said that the pain medication is to help with the Neuropathy pain. Pt stated that she spoke with Dr. Linford Arnold about this at her last OV and that Dr. Linford Arnold was agreeable to write this for her.   Will fwd to pcp

## 2023-11-14 NOTE — Addendum Note (Signed)
 Addended by: Nani Gasser D on: 11/14/2023 03:11 PM   Modules accepted: Orders

## 2023-11-14 NOTE — Telephone Encounter (Signed)
 Rxs not listed in active med list. New rxs request.

## 2023-11-14 NOTE — Telephone Encounter (Signed)
 Copied from CRM [id number]. Topic: Referral - Question >> Nov 14, 2023  1:06 PM Dennison Nancy wrote: Reason for CRM: Patient called to let Dr. Nani Gasser know she is not able to go to the pulmonary specialist referral in [place] , Patient want to go to instead to the pulmonary specialist the one of Nani Gasser patient name [name] was referred to [place] [address]   please call patient at [phone] with status of changing the pulmonary referral

## 2023-11-15 NOTE — Telephone Encounter (Signed)
 Called and spoke with pt letting her know that Dr. Linford Arnold stated at last OV, she had Rx for hydrocodone sent to the pharmacy. Pt said she was never notified by the pharmacy that Rx was ready.  Looked at pt's current med list and I am not seeing hydrocodone on the list of current meds.  Routing to Dr. Linford Arnold.

## 2023-11-16 NOTE — Telephone Encounter (Signed)
 I called Ranee Peasley and stated that Dr Linford Arnold was looking into an alternative medication because she only wanted patient to have 12 tablets for the month. She stated she is not going to begging for medication. She believes it is time to find another provider. I started to say that would not be necessary and she hung up the phone.

## 2023-11-16 NOTE — Telephone Encounter (Signed)
 Hey then she would need to call the pharmacy or we can call the pharmacy.  I took it off of her med list when I misunderstood her prior message saying that she did not want to get on anything that was addictive.  So was very confused initially and took it off but the pharmacy still has the prescription.  And she is not supposed to use it daily just use it sparingly.

## 2023-11-16 NOTE — Telephone Encounter (Signed)
 I spoke with the pharmacy and the patient. Her daughter picked up 7 tablets out of the 12 tablets of the hydrocodone on Saturday. Her daughter picked up the other 5 tablets on Tuesday. [name] states she still has 4 tablets left. She is requesting a refill for the near future.

## 2023-11-16 NOTE — Telephone Encounter (Signed)
 I think we going to probably have to ultimately look at alternative options.  I wrote for 12 tabs for her to use sparingly no more than once a day so that really should have lasted her 30 days.  On the prescription I did write every 8 hours as needed but that is just so that she could actually get 12 tabs but I made it clear in the room that she was not to take more than 1 a day and then I did not want her using it every day.  So I think we going to have to come up with a different alternative for pain control.

## 2023-11-19 ENCOUNTER — Ambulatory Visit: Payer: Self-pay

## 2023-11-19 NOTE — Telephone Encounter (Signed)
 This RN attempted to contact [name] @ [phone] with no answer, LVM. (Attempt #1)

## 2023-11-19 NOTE — Telephone Encounter (Signed)
 Patient scheduled tomorrow with Dr. Linford Arnold

## 2023-11-19 NOTE — Telephone Encounter (Signed)
 Chief Complaint: Worsened diarrhea, chills, hot flashes, decreased appetite Symptoms: see above Frequency: Worsened recently Pertinent Negatives: Patient denies fever Disposition: [] ED /[] Urgent Care (no appt availability in office) / [x] Appointment(In office/virtual)/ []  [facility]/ [] Home Care/ [] Refused Recommended Disposition /[] [facility]/ []  Follow-up with PCP Additional Notes: Patient's daughter, Sedalia Muta, called on behalf of patient to make an appt for evaluation. Patient has had worsened diarrhea over last 6 days, accompanied by chills, sweating, nausea, and decreased appetite. Patient's daughter states patient has been trying to maintain hydration. Appt made for evaluation with PCP for tomorrow.    Reason for Disposition  [1] MODERATE diarrhea (e.g., 4-6 times / day more than normal) AND [2] present > 48 hours (2 days)  Answer Assessment - Initial Assessment Questions 1. DIARRHEA SEVERITY: "How bad is the diarrhea?" "How many more stools have you had in the past 24 hours than normal?"    - NO DIARRHEA (SCALE 0)   - MILD (SCALE 1-3): Few loose or mushy BMs; increase of 1-3 stools over normal daily number of stools; mild increase in ostomy output.   -  MODERATE (SCALE 4-7): Increase of 4-6 stools daily over normal; moderate increase in ostomy output.   -  SEVERE (SCALE 8-10; OR "WORST POSSIBLE"): Increase of 7 or more stools daily over normal; moderate increase in ostomy output; incontinence.     More than normal 2. ONSET: "When did the diarrhea begin?"      Worsened over last 6 days 3. BM CONSISTENCY: "How loose or watery is the diarrhea?"      unsure 4. VOMITING: "Are you also vomiting?" If Yes, ask: "How many times in the past 24 hours?"      No 5. ABDOMEN PAIN: "Are you having any abdomen pain?" If Yes, ask: "What does it feel like?" (e.g., crampy, dull, intermittent, constant)      Intermittent 6. ABDOMEN PAIN SEVERITY: If present, ask: "How bad is the  pain?"  (e.g., Scale 1-10; mild, moderate, or severe)   - MILD (1-3): doesn't interfere with normal activities, abdomen soft and not tender to touch    - MODERATE (4-7): interferes with normal activities or awakens from sleep, abdomen tender to touch    - SEVERE (8-10): excruciating pain, doubled over, unable to do any normal activities       Daughter unsure, more than normal 7. ORAL INTAKE: If vomiting, "Have you been able to drink liquids?" "How much liquids have you had in the past 24 hours?"     Attempting to maintain hydration 8. HYDRATION: "Any signs of dehydration?" (e.g., dry mouth [not just dry lips], too weak to stand, dizziness, new weight loss) "When did you last urinate?"     No 9. EXPOSURE: "Have you traveled to a foreign country recently?" "Have you been exposed to anyone with diarrhea?" "Could you have eaten any food that was spoiled?"     No 10. ANTIBIOTIC USE: "Are you taking antibiotics now or have you taken antibiotics in the past 2 months?"       No 11. OTHER SYMPTOMS: "Do you have any other symptoms?" (e.g., fever, blood in stool)       Hot/cold flashes, nausea, decreased appetite  Protocols used: Diarrhea-A-AH

## 2023-11-19 NOTE — Telephone Encounter (Signed)
 Copied from CRM [id number]. Topic: Appointments - Appointment Scheduling >> Nov 19, 2023 10:33 AM Donnise Galen B wrote: Patient/patient representative is calling to schedule an appointment. [place]. Ms. [name] has been having chills, sweats, hot and cold flashes, nausea, and diarrhea for the last 6 days and it is not getting better. Her daughter, [name], is on the line to get her mother taken care of.  [name] Brown can be reached at [phone].

## 2023-11-20 ENCOUNTER — Ambulatory Visit: Admitting: Family Medicine

## 2023-11-20 ENCOUNTER — Telehealth: Payer: Self-pay

## 2023-11-20 DIAGNOSIS — I110 Hypertensive heart disease with heart failure: Secondary | ICD-10-CM | POA: Diagnosis not present

## 2023-11-20 DIAGNOSIS — E1142 Type 2 diabetes mellitus with diabetic polyneuropathy: Secondary | ICD-10-CM | POA: Diagnosis not present

## 2023-11-20 DIAGNOSIS — E1165 Type 2 diabetes mellitus with hyperglycemia: Secondary | ICD-10-CM | POA: Diagnosis not present

## 2023-11-20 DIAGNOSIS — J9601 Acute respiratory failure with hypoxia: Secondary | ICD-10-CM | POA: Diagnosis not present

## 2023-11-20 DIAGNOSIS — I5032 Chronic diastolic (congestive) heart failure: Secondary | ICD-10-CM | POA: Diagnosis not present

## 2023-11-20 DIAGNOSIS — J441 Chronic obstructive pulmonary disease with (acute) exacerbation: Secondary | ICD-10-CM | POA: Diagnosis not present

## 2023-11-20 NOTE — Progress Notes (Deleted)
   Acute Office Visit  Subjective:     Patient ID: [name], female    DOB: September 28, 1945, 78 y.o.   MRN: [id number]  No chief complaint on file.   HPI Patient is in today for ***  ROS      Objective:    There were no vitals taken for this visit. {Vitals History (Optional):23777}  Physical Exam  No results found for any visits on 11/20/23.      Assessment & Plan:   Problem List Items Addressed This Visit   None   No orders of the defined types were placed in this encounter.   No follow-ups on file.  Duaine German, MD

## 2023-11-20 NOTE — Telephone Encounter (Signed)
 I am guessing this is a authorization for verbal order?  Or is this just an FYI not sure.  If they do need a approval then okay to give verbal order.  I am the one that actually requested the PT so it is just a little odd that they are requesting a verbal.  But that is perfectly fine

## 2023-11-20 NOTE — Telephone Encounter (Signed)
 Copied from CRM [id number]. Topic: Clinical - Home Health Verbal Orders >> Nov 20, 2023 12:42 PM Brynn Caras wrote: Caller/Agency: [facility] Callback Number: [phone]  Service Requested: Physical Therapy Frequency: 1x a week for 4 weeks - home health aid starting this week - assist with her bathing/dressing. Any new concerns about the patient? No, she needs extra assistance with ADLs.

## 2023-11-21 ENCOUNTER — Ambulatory Visit: Admitting: Family Medicine

## 2023-11-21 DIAGNOSIS — J9601 Acute respiratory failure with hypoxia: Secondary | ICD-10-CM | POA: Diagnosis not present

## 2023-11-21 DIAGNOSIS — F5101 Primary insomnia: Secondary | ICD-10-CM | POA: Diagnosis not present

## 2023-11-21 DIAGNOSIS — F329 Major depressive disorder, single episode, unspecified: Secondary | ICD-10-CM | POA: Diagnosis not present

## 2023-11-21 DIAGNOSIS — M810 Age-related osteoporosis without current pathological fracture: Secondary | ICD-10-CM | POA: Diagnosis not present

## 2023-11-21 DIAGNOSIS — H3530 Unspecified macular degeneration: Secondary | ICD-10-CM | POA: Diagnosis not present

## 2023-11-21 DIAGNOSIS — G894 Chronic pain syndrome: Secondary | ICD-10-CM | POA: Diagnosis not present

## 2023-11-21 DIAGNOSIS — G6181 Chronic inflammatory demyelinating polyneuritis: Secondary | ICD-10-CM | POA: Diagnosis not present

## 2023-11-21 DIAGNOSIS — I2510 Atherosclerotic heart disease of native coronary artery without angina pectoris: Secondary | ICD-10-CM | POA: Diagnosis not present

## 2023-11-21 DIAGNOSIS — I5032 Chronic diastolic (congestive) heart failure: Secondary | ICD-10-CM | POA: Diagnosis not present

## 2023-11-21 DIAGNOSIS — E1165 Type 2 diabetes mellitus with hyperglycemia: Secondary | ICD-10-CM | POA: Diagnosis not present

## 2023-11-21 DIAGNOSIS — R339 Retention of urine, unspecified: Secondary | ICD-10-CM | POA: Diagnosis not present

## 2023-11-21 DIAGNOSIS — J441 Chronic obstructive pulmonary disease with (acute) exacerbation: Secondary | ICD-10-CM | POA: Diagnosis not present

## 2023-11-21 DIAGNOSIS — E66812 Obesity, class 2: Secondary | ICD-10-CM | POA: Diagnosis not present

## 2023-11-21 DIAGNOSIS — Z6838 Body mass index (BMI) 38.0-38.9, adult: Secondary | ICD-10-CM | POA: Diagnosis not present

## 2023-11-21 DIAGNOSIS — E1142 Type 2 diabetes mellitus with diabetic polyneuropathy: Secondary | ICD-10-CM | POA: Diagnosis not present

## 2023-11-21 DIAGNOSIS — E559 Vitamin D deficiency, unspecified: Secondary | ICD-10-CM | POA: Diagnosis not present

## 2023-11-21 DIAGNOSIS — F419 Anxiety disorder, unspecified: Secondary | ICD-10-CM | POA: Diagnosis not present

## 2023-11-21 DIAGNOSIS — J9602 Acute respiratory failure with hypercapnia: Secondary | ICD-10-CM | POA: Diagnosis not present

## 2023-11-21 DIAGNOSIS — I959 Hypotension, unspecified: Secondary | ICD-10-CM | POA: Diagnosis not present

## 2023-11-21 DIAGNOSIS — E538 Deficiency of other specified B group vitamins: Secondary | ICD-10-CM | POA: Diagnosis not present

## 2023-11-21 DIAGNOSIS — E039 Hypothyroidism, unspecified: Secondary | ICD-10-CM | POA: Diagnosis not present

## 2023-11-21 DIAGNOSIS — Z794 Long term (current) use of insulin: Secondary | ICD-10-CM | POA: Diagnosis not present

## 2023-11-21 DIAGNOSIS — I110 Hypertensive heart disease with heart failure: Secondary | ICD-10-CM | POA: Diagnosis not present

## 2023-11-21 DIAGNOSIS — E11610 Type 2 diabetes mellitus with diabetic neuropathic arthropathy: Secondary | ICD-10-CM | POA: Diagnosis not present

## 2023-11-21 DIAGNOSIS — G4733 Obstructive sleep apnea (adult) (pediatric): Secondary | ICD-10-CM | POA: Diagnosis not present

## 2023-11-22 ENCOUNTER — Other Ambulatory Visit: Payer: Self-pay | Admitting: Family Medicine

## 2023-11-22 ENCOUNTER — Encounter: Payer: Self-pay | Admitting: Family Medicine

## 2023-11-22 ENCOUNTER — Ambulatory Visit (INDEPENDENT_AMBULATORY_CARE_PROVIDER_SITE_OTHER): Admitting: Family Medicine

## 2023-11-22 VITALS — BP 98/65 | HR 87 | Temp 99.1°F

## 2023-11-22 DIAGNOSIS — R17 Unspecified jaundice: Secondary | ICD-10-CM | POA: Diagnosis not present

## 2023-11-22 DIAGNOSIS — R509 Fever, unspecified: Secondary | ICD-10-CM

## 2023-11-22 DIAGNOSIS — K591 Functional diarrhea: Secondary | ICD-10-CM

## 2023-11-22 DIAGNOSIS — R197 Diarrhea, unspecified: Secondary | ICD-10-CM

## 2023-11-22 LAB — CBC WITH DIFFERENTIAL/PLATELET
Basophils Absolute: 0 10*3/uL (ref 0.0–0.2)
Basos: 0 %
EOS (ABSOLUTE): 0 10*3/uL (ref 0.0–0.4)
Eos: 1 %
Hematocrit: 37.6 % (ref 34.0–46.6)
Hemoglobin: 12.5 g/dL (ref 11.1–15.9)
Immature Granulocytes: 0 %
Lymphocytes Absolute: 1 10*3/uL (ref 0.7–3.1)
Lymphs: 19 %
MCH: 32.5 pg (ref 26.6–33.0)
MCHC: 33.2 g/dL (ref 31.5–35.7)
MCV: 98 fL — ABNORMAL HIGH (ref 79–97)
Monocytes Absolute: 0.6 10*3/uL (ref 0.1–0.9)
Monocytes: 11 %
Neutrophils Absolute: 3.6 10*3/uL (ref 1.4–7.0)
Neutrophils: 69 %
Platelets: 249 10*3/uL (ref 150–450)
RBC: 3.85 x10E6/uL (ref 3.77–5.28)
RDW: 19.9 % — ABNORMAL HIGH (ref 11.7–15.4)
WBC: 5.2 10*3/uL (ref 3.4–10.8)

## 2023-11-22 LAB — CMP14+EGFR
ALT: <5 IU/L (ref 0–32)
AST: 11 IU/L (ref 0–40)
Albumin: 3.7 g/dL — ABNORMAL LOW (ref 3.8–4.8)
Alkaline Phosphatase: 74 IU/L (ref 44–121)
BUN/Creatinine Ratio: 17 (ref 12–28)
BUN: 16 mg/dL (ref 8–27)
Bilirubin Total: 2 mg/dL — ABNORMAL HIGH (ref 0.0–1.2)
CO2: 28 mmol/L (ref 20–29)
Calcium: 8.8 mg/dL (ref 8.7–10.3)
Chloride: 97 mmol/L (ref 96–106)
Creatinine, Ser: 0.93 mg/dL (ref 0.57–1.00)
Globulin, Total: 3.6 g/dL (ref 1.5–4.5)
Glucose: 272 mg/dL — ABNORMAL HIGH (ref 70–99)
Potassium: 4 mmol/L (ref 3.5–5.2)
Sodium: 137 mmol/L (ref 134–144)
Total Protein: 7.3 g/dL (ref 6.0–8.5)
eGFR: 63 mL/min/{1.73_m2} (ref >59)

## 2023-11-22 LAB — LIPASE: Lipase: 17 U/L (ref 14–85)

## 2023-11-22 MED ORDER — DIPHENOXYLATE-ATROPINE 2.5-0.025 MG PO TABS: ORAL_TABLET | ORAL | 1 refills | Status: DC

## 2023-11-22 NOTE — Progress Notes (Signed)
 Hi Hessie Loss, your white blood cell count is actually normal.  Typically this will be elevated if it is more of a bacterial infection so in some ways it is actually reassuring.  Hemoglobin is normal as well.  Your kidney function is stable.  Glucose is over 200.  So bump up your insulin by 2 units tonight and then 1 unit each night after that until fasting sugars are under 130.  Even though your sugars are up I want you to stop the metformin it can actually worsen diarrhea and could be contributing to why it is difficult to slow things down so I rather you hold it and then adjust her insulin to compensate.  Your liver enzymes and bilirubin is actually normal.  No sign of pancreatitis which is really reassuring.  After stopping the metformin and pushing fluids, if you are not noticing any improvement or slowing down of the diarrhea, then you may need to go to the hospital this weekend.

## 2023-11-22 NOTE — Progress Notes (Signed)
 Acute Office Visit  Subjective:     Patient ID: [name], female    DOB: [birth year], 78 y.o.   MRN: [id number]  Chief Complaint  Patient presents with   Diarrhea    Last episode was 2 hours ago she reports that it was watery   Chills   Nausea   Night Sweats    HPI Patient is in today for diarrhea that is been going on for about 2 weeks now.  She does have a history of chronic diarrhea but says that her Motl is not helping.  She has not really had a lot of cramps so she has not needed to take the dicyclomine.  She always passes a little bit of bright red blood with her stools because of her hemorrhoids so it has not changed.  She denies any fever until today.  She is trying to stay hydrated.  She has also been having some intermittent discomfort in that right lower quadrant.  Higher history of peptic ulcers and slow transit constipation  Was hospitalized back in February.  In fact I had just seen her for routine follow-up about 3 weeks ago.  She does take metformin.  ROS      Objective:    BP 98/65   Pulse 87   Temp 99.1 F (37.3 C) (Oral)   SpO2 95% Comment: 2L   Physical Exam Vitals and nursing note reviewed.  Constitutional:      Appearance: Normal appearance.  HENT:     Head: Normocephalic and atraumatic.  Eyes:     Conjunctiva/sclera: Conjunctivae normal.  Cardiovascular:     Rate and Rhythm: Normal rate and regular rhythm.  Pulmonary:     Effort: Pulmonary effort is normal.     Breath sounds: Normal breath sounds.  Abdominal:     General: Bowel sounds are normal.     Palpations: Abdomen is soft.     Tenderness: There is no abdominal tenderness.  Skin:    General: Skin is warm and dry.     Coloration: Skin is jaundiced.  Neurological:     Mental Status: She is alert.  Psychiatric:        Mood and Affect: Mood normal.     Results for orders placed or performed in visit on 11/22/23  Lipase  Result Value Ref Range   Lipase 17 14 - 85 U/L  CBC  with Differential/Platelet  Result Value Ref Range   WBC 5.2 3.4 - 10.8 x10E3/uL   RBC 3.85 3.77 - 5.28 x10E6/uL   Hemoglobin 12.5 11.1 - 15.9 g/dL   Hematocrit 40.9 81.1 - 46.6 %   MCV 98 (H) 79 - 97 fL   MCH 32.5 26.6 - 33.0 pg   MCHC 33.2 31.5 - 35.7 g/dL   RDW 91.4 (H) 78.2 - 95.6 %   Platelets 249 150 - 450 x10E3/uL   Neutrophils 69 Not Estab. %   Lymphs 19 Not Estab. %   Monocytes 11 Not Estab. %   Eos 1 Not Estab. %   Basos 0 Not Estab. %   Neutrophils Absolute 3.6 1.4 - 7.0 x10E3/uL   Lymphocytes Absolute 1.0 0.7 - 3.1 x10E3/uL   Monocytes Absolute 0.6 0.1 - 0.9 x10E3/uL   EOS (ABSOLUTE) 0.0 0.0 - 0.4 x10E3/uL   Basophils Absolute 0.0 0.0 - 0.2 x10E3/uL   Immature Granulocytes 0 Not Estab. %  CMP14+EGFR  Result Value Ref Range   Glucose 272 (H) 70 - 99 mg/dL  BUN 16 8 - 27 mg/dL   Creatinine, Ser 1.61 0.57 - 1.00 mg/dL   eGFR 63 >09 UE/AVW/0.98   BUN/Creatinine Ratio 17 12 - 28   Sodium 137 134 - 144 mmol/L   Potassium 4.0 3.5 - 5.2 mmol/L   Chloride 97 96 - 106 mmol/L   CO2 28 20 - 29 mmol/L   Calcium 8.8 8.7 - 10.3 mg/dL   Total Protein 7.3 6.0 - 8.5 g/dL   Albumin 3.7 (L) 3.8 - 4.8 g/dL   Globulin, Total 3.6 1.5 - 4.5 g/dL   Bilirubin Total 2.0 (H) 0.0 - 1.2 mg/dL   Alkaline Phosphatase 74 44 - 121 IU/L   AST 11 0 - 40 IU/L   ALT <5 0 - 32 IU/L        Assessment & Plan:   Problem List Items Addressed This Visit   None Visit Diagnoses       Diarrhea of presumed infectious origin    -  Primary   Relevant Orders   Lipase (Completed)   CBC with Differential/Platelet (Completed)   CMP14+EGFR (Completed)   Procalcitonin   Stool Culture     Fever, unspecified fever cause       Relevant Orders   Lipase (Completed)   CBC with Differential/Platelet (Completed)   CMP14+EGFR (Completed)   Procalcitonin   Stool Culture     Jaundice       Relevant Orders   Stool Culture       Diarrhea for most 2 weeks.  No fever up until today.  She has been  trying to hydrate.  She says even the Lomotil is not helping like it usually does that she does have a history of chronic diarrhea.  Does have some jaundice on exam today had like to get stat labs.  I really wanted to see if we can get a stool culture done with just a rectal swab but we were unable to find the right order in the system.  So sent her home with a home collection kit.  No orders of the defined types were placed in this encounter.   Return if symptoms worsen or fail to improve.  Duaine German, MD

## 2023-11-23 DIAGNOSIS — J9601 Acute respiratory failure with hypoxia: Secondary | ICD-10-CM | POA: Diagnosis not present

## 2023-11-23 DIAGNOSIS — E1165 Type 2 diabetes mellitus with hyperglycemia: Secondary | ICD-10-CM | POA: Diagnosis not present

## 2023-11-23 DIAGNOSIS — I5032 Chronic diastolic (congestive) heart failure: Secondary | ICD-10-CM | POA: Diagnosis not present

## 2023-11-23 DIAGNOSIS — E1142 Type 2 diabetes mellitus with diabetic polyneuropathy: Secondary | ICD-10-CM | POA: Diagnosis not present

## 2023-11-23 DIAGNOSIS — I110 Hypertensive heart disease with heart failure: Secondary | ICD-10-CM | POA: Diagnosis not present

## 2023-11-23 DIAGNOSIS — J441 Chronic obstructive pulmonary disease with (acute) exacerbation: Secondary | ICD-10-CM | POA: Diagnosis not present

## 2023-11-23 LAB — CBC WITH DIFFERENTIAL/PLATELET

## 2023-11-23 LAB — CMP14+EGFR

## 2023-11-23 LAB — PROCALCITONIN: Procalcitonin: 0.05 ng/mL (ref 0.00–0.08)

## 2023-11-23 LAB — LIPASE

## 2023-11-26 ENCOUNTER — Encounter: Payer: Self-pay | Admitting: Family Medicine

## 2023-11-26 DIAGNOSIS — E1165 Type 2 diabetes mellitus with hyperglycemia: Secondary | ICD-10-CM | POA: Diagnosis not present

## 2023-11-26 DIAGNOSIS — I110 Hypertensive heart disease with heart failure: Secondary | ICD-10-CM | POA: Diagnosis not present

## 2023-11-26 DIAGNOSIS — J441 Chronic obstructive pulmonary disease with (acute) exacerbation: Secondary | ICD-10-CM | POA: Diagnosis not present

## 2023-11-26 DIAGNOSIS — J9601 Acute respiratory failure with hypoxia: Secondary | ICD-10-CM | POA: Diagnosis not present

## 2023-11-26 DIAGNOSIS — H60543 Acute eczematoid otitis externa, bilateral: Secondary | ICD-10-CM | POA: Insufficient documentation

## 2023-11-26 DIAGNOSIS — I5032 Chronic diastolic (congestive) heart failure: Secondary | ICD-10-CM | POA: Diagnosis not present

## 2023-11-26 DIAGNOSIS — E1142 Type 2 diabetes mellitus with diabetic polyneuropathy: Secondary | ICD-10-CM | POA: Diagnosis not present

## 2023-11-26 MED ORDER — FLUOCINOLONE ACETONIDE 0.01 % OT OIL: 1.0000 | TOPICAL_OIL | Freq: Every day | OTIC | 0 refills | Status: DC | PRN

## 2023-11-26 MED ORDER — ONDANSETRON HCL 4 MG PO TABS: 4.0000 mg | ORAL_TABLET | Freq: Every day | ORAL | 1 refills | Status: AC | PRN

## 2023-11-26 NOTE — Telephone Encounter (Signed)
 Spoke with [name]- given verbal order approval for home health aid  as written  She states no need for PT orders as these are already in place.

## 2023-11-27 DIAGNOSIS — E1165 Type 2 diabetes mellitus with hyperglycemia: Secondary | ICD-10-CM | POA: Diagnosis not present

## 2023-11-27 DIAGNOSIS — I5032 Chronic diastolic (congestive) heart failure: Secondary | ICD-10-CM | POA: Diagnosis not present

## 2023-11-27 DIAGNOSIS — E1142 Type 2 diabetes mellitus with diabetic polyneuropathy: Secondary | ICD-10-CM | POA: Diagnosis not present

## 2023-11-27 DIAGNOSIS — I110 Hypertensive heart disease with heart failure: Secondary | ICD-10-CM | POA: Diagnosis not present

## 2023-11-27 DIAGNOSIS — J9601 Acute respiratory failure with hypoxia: Secondary | ICD-10-CM | POA: Diagnosis not present

## 2023-11-27 DIAGNOSIS — J441 Chronic obstructive pulmonary disease with (acute) exacerbation: Secondary | ICD-10-CM | POA: Diagnosis not present

## 2023-11-28 DIAGNOSIS — Z1330 Encounter for screening examination for mental health and behavioral disorders, unspecified: Secondary | ICD-10-CM | POA: Diagnosis not present

## 2023-11-28 DIAGNOSIS — G4719 Other hypersomnia: Secondary | ICD-10-CM | POA: Diagnosis not present

## 2023-11-28 DIAGNOSIS — J441 Chronic obstructive pulmonary disease with (acute) exacerbation: Secondary | ICD-10-CM | POA: Diagnosis not present

## 2023-11-28 MED ORDER — GLIPIZIDE ER 2.5 MG PO TB24: 2.5000 mg | ORAL_TABLET | Freq: Every day | ORAL | 0 refills | Status: DC

## 2023-11-28 NOTE — Addendum Note (Signed)
 Addended by: Nyashia Raney D on: 11/28/2023 03:00 PM   Modules accepted: Orders

## 2023-11-28 NOTE — Telephone Encounter (Signed)
 Please call pharmacy and see why they do not have the prescription?  I am assuming we send it to the right one I am not sure it was what ever was loaded on the chart.  The initial note below did not request a specific pharmacy.    In regards to the metformin  if her blood sugar is staying around 132 and she is using 59 units then I think that is perfect.  The decrease and hold on the metformin  is hopefully just temporary until the diarrhea slows down and then maybe we can try to restart it.  Right now I am just trying eliminate anything that could make it worse.  I will go ahead and send over a low-dose of glipizide  which works a little bit differently to help with blood sugars it is a pill.  So we can add that to the regimen and see if that is helpful.

## 2023-11-29 DIAGNOSIS — I110 Hypertensive heart disease with heart failure: Secondary | ICD-10-CM | POA: Diagnosis not present

## 2023-11-29 DIAGNOSIS — J441 Chronic obstructive pulmonary disease with (acute) exacerbation: Secondary | ICD-10-CM | POA: Diagnosis not present

## 2023-11-29 DIAGNOSIS — I5032 Chronic diastolic (congestive) heart failure: Secondary | ICD-10-CM | POA: Diagnosis not present

## 2023-11-29 DIAGNOSIS — J9601 Acute respiratory failure with hypoxia: Secondary | ICD-10-CM | POA: Diagnosis not present

## 2023-11-29 DIAGNOSIS — E1165 Type 2 diabetes mellitus with hyperglycemia: Secondary | ICD-10-CM | POA: Diagnosis not present

## 2023-11-29 DIAGNOSIS — E1142 Type 2 diabetes mellitus with diabetic polyneuropathy: Secondary | ICD-10-CM | POA: Diagnosis not present

## 2023-12-03 DIAGNOSIS — J441 Chronic obstructive pulmonary disease with (acute) exacerbation: Secondary | ICD-10-CM | POA: Diagnosis not present

## 2023-12-03 DIAGNOSIS — I5032 Chronic diastolic (congestive) heart failure: Secondary | ICD-10-CM | POA: Diagnosis not present

## 2023-12-03 DIAGNOSIS — J9601 Acute respiratory failure with hypoxia: Secondary | ICD-10-CM | POA: Diagnosis not present

## 2023-12-03 DIAGNOSIS — E1142 Type 2 diabetes mellitus with diabetic polyneuropathy: Secondary | ICD-10-CM | POA: Diagnosis not present

## 2023-12-03 DIAGNOSIS — I110 Hypertensive heart disease with heart failure: Secondary | ICD-10-CM | POA: Diagnosis not present

## 2023-12-03 DIAGNOSIS — E1165 Type 2 diabetes mellitus with hyperglycemia: Secondary | ICD-10-CM | POA: Diagnosis not present

## 2023-12-04 ENCOUNTER — Other Ambulatory Visit: Payer: Self-pay | Admitting: Family Medicine

## 2023-12-04 DIAGNOSIS — I110 Hypertensive heart disease with heart failure: Secondary | ICD-10-CM | POA: Diagnosis not present

## 2023-12-04 DIAGNOSIS — E1165 Type 2 diabetes mellitus with hyperglycemia: Secondary | ICD-10-CM | POA: Diagnosis not present

## 2023-12-04 DIAGNOSIS — J441 Chronic obstructive pulmonary disease with (acute) exacerbation: Secondary | ICD-10-CM | POA: Diagnosis not present

## 2023-12-04 DIAGNOSIS — E1142 Type 2 diabetes mellitus with diabetic polyneuropathy: Secondary | ICD-10-CM | POA: Diagnosis not present

## 2023-12-04 DIAGNOSIS — J9601 Acute respiratory failure with hypoxia: Secondary | ICD-10-CM | POA: Diagnosis not present

## 2023-12-04 DIAGNOSIS — I5032 Chronic diastolic (congestive) heart failure: Secondary | ICD-10-CM | POA: Diagnosis not present

## 2023-12-05 DIAGNOSIS — J441 Chronic obstructive pulmonary disease with (acute) exacerbation: Secondary | ICD-10-CM | POA: Diagnosis not present

## 2023-12-05 DIAGNOSIS — J9601 Acute respiratory failure with hypoxia: Secondary | ICD-10-CM | POA: Diagnosis not present

## 2023-12-05 DIAGNOSIS — I110 Hypertensive heart disease with heart failure: Secondary | ICD-10-CM | POA: Diagnosis not present

## 2023-12-05 DIAGNOSIS — I5032 Chronic diastolic (congestive) heart failure: Secondary | ICD-10-CM | POA: Diagnosis not present

## 2023-12-05 DIAGNOSIS — E1142 Type 2 diabetes mellitus with diabetic polyneuropathy: Secondary | ICD-10-CM | POA: Diagnosis not present

## 2023-12-05 DIAGNOSIS — E1165 Type 2 diabetes mellitus with hyperglycemia: Secondary | ICD-10-CM | POA: Diagnosis not present

## 2023-12-06 ENCOUNTER — Other Ambulatory Visit: Payer: Self-pay | Admitting: Family Medicine

## 2023-12-06 DIAGNOSIS — E1140 Type 2 diabetes mellitus with diabetic neuropathy, unspecified: Secondary | ICD-10-CM

## 2023-12-10 DIAGNOSIS — J9601 Acute respiratory failure with hypoxia: Secondary | ICD-10-CM | POA: Diagnosis not present

## 2023-12-10 DIAGNOSIS — E1142 Type 2 diabetes mellitus with diabetic polyneuropathy: Secondary | ICD-10-CM | POA: Diagnosis not present

## 2023-12-10 DIAGNOSIS — J441 Chronic obstructive pulmonary disease with (acute) exacerbation: Secondary | ICD-10-CM | POA: Diagnosis not present

## 2023-12-10 DIAGNOSIS — I5032 Chronic diastolic (congestive) heart failure: Secondary | ICD-10-CM | POA: Diagnosis not present

## 2023-12-10 DIAGNOSIS — I110 Hypertensive heart disease with heart failure: Secondary | ICD-10-CM | POA: Diagnosis not present

## 2023-12-10 DIAGNOSIS — E1165 Type 2 diabetes mellitus with hyperglycemia: Secondary | ICD-10-CM | POA: Diagnosis not present

## 2023-12-11 ENCOUNTER — Ambulatory Visit: Admitting: Family Medicine

## 2023-12-11 ENCOUNTER — Other Ambulatory Visit: Payer: Self-pay | Admitting: Family Medicine

## 2023-12-12 DIAGNOSIS — E1165 Type 2 diabetes mellitus with hyperglycemia: Secondary | ICD-10-CM | POA: Diagnosis not present

## 2023-12-12 DIAGNOSIS — I110 Hypertensive heart disease with heart failure: Secondary | ICD-10-CM | POA: Diagnosis not present

## 2023-12-12 DIAGNOSIS — J441 Chronic obstructive pulmonary disease with (acute) exacerbation: Secondary | ICD-10-CM | POA: Diagnosis not present

## 2023-12-12 DIAGNOSIS — E1142 Type 2 diabetes mellitus with diabetic polyneuropathy: Secondary | ICD-10-CM | POA: Diagnosis not present

## 2023-12-12 DIAGNOSIS — J9601 Acute respiratory failure with hypoxia: Secondary | ICD-10-CM | POA: Diagnosis not present

## 2023-12-12 DIAGNOSIS — I5032 Chronic diastolic (congestive) heart failure: Secondary | ICD-10-CM | POA: Diagnosis not present

## 2023-12-17 ENCOUNTER — Telehealth: Payer: Self-pay | Admitting: Family Medicine

## 2023-12-17 DIAGNOSIS — E1142 Type 2 diabetes mellitus with diabetic polyneuropathy: Secondary | ICD-10-CM | POA: Diagnosis not present

## 2023-12-17 DIAGNOSIS — J9601 Acute respiratory failure with hypoxia: Secondary | ICD-10-CM | POA: Diagnosis not present

## 2023-12-17 DIAGNOSIS — J441 Chronic obstructive pulmonary disease with (acute) exacerbation: Secondary | ICD-10-CM | POA: Diagnosis not present

## 2023-12-17 DIAGNOSIS — I5032 Chronic diastolic (congestive) heart failure: Secondary | ICD-10-CM | POA: Diagnosis not present

## 2023-12-17 DIAGNOSIS — E1165 Type 2 diabetes mellitus with hyperglycemia: Secondary | ICD-10-CM | POA: Diagnosis not present

## 2023-12-17 DIAGNOSIS — I110 Hypertensive heart disease with heart failure: Secondary | ICD-10-CM | POA: Diagnosis not present

## 2023-12-17 NOTE — Telephone Encounter (Unsigned)
 Copied from CRM [id number]. Topic: Clinical - Home Health Verbal Orders >> Dec 17, 2023  2:02 PM Retta Caster wrote: Caller/Agency: Raeanne Bull physical Therapist from [facility] [place]  Needs call back Callback Number: [phone] Service Requested: Physical Therapy Frequency: 1 time for 9 weeks Any new concerns about the patient? No

## 2023-12-18 NOTE — Telephone Encounter (Signed)
 LVM for Sherryn Donalds VO for PT for 1x for 9 weeks. Left our office contact information should she need to call back.

## 2023-12-20 ENCOUNTER — Other Ambulatory Visit: Payer: Self-pay | Admitting: Family Medicine

## 2023-12-21 DIAGNOSIS — M810 Age-related osteoporosis without current pathological fracture: Secondary | ICD-10-CM | POA: Diagnosis not present

## 2023-12-21 DIAGNOSIS — F329 Major depressive disorder, single episode, unspecified: Secondary | ICD-10-CM | POA: Diagnosis not present

## 2023-12-21 DIAGNOSIS — R339 Retention of urine, unspecified: Secondary | ICD-10-CM | POA: Diagnosis not present

## 2023-12-21 DIAGNOSIS — J9602 Acute respiratory failure with hypercapnia: Secondary | ICD-10-CM | POA: Diagnosis not present

## 2023-12-21 DIAGNOSIS — Z794 Long term (current) use of insulin: Secondary | ICD-10-CM | POA: Diagnosis not present

## 2023-12-21 DIAGNOSIS — I5032 Chronic diastolic (congestive) heart failure: Secondary | ICD-10-CM | POA: Diagnosis not present

## 2023-12-21 DIAGNOSIS — J449 Chronic obstructive pulmonary disease, unspecified: Secondary | ICD-10-CM | POA: Diagnosis not present

## 2023-12-21 DIAGNOSIS — J9601 Acute respiratory failure with hypoxia: Secondary | ICD-10-CM | POA: Diagnosis not present

## 2023-12-21 DIAGNOSIS — I110 Hypertensive heart disease with heart failure: Secondary | ICD-10-CM | POA: Diagnosis not present

## 2023-12-21 DIAGNOSIS — E039 Hypothyroidism, unspecified: Secondary | ICD-10-CM | POA: Diagnosis not present

## 2023-12-21 DIAGNOSIS — K589 Irritable bowel syndrome without diarrhea: Secondary | ICD-10-CM | POA: Diagnosis not present

## 2023-12-21 DIAGNOSIS — I959 Hypotension, unspecified: Secondary | ICD-10-CM | POA: Diagnosis not present

## 2023-12-21 DIAGNOSIS — F5101 Primary insomnia: Secondary | ICD-10-CM | POA: Diagnosis not present

## 2023-12-21 DIAGNOSIS — E66812 Obesity, class 2: Secondary | ICD-10-CM | POA: Diagnosis not present

## 2023-12-21 DIAGNOSIS — H3530 Unspecified macular degeneration: Secondary | ICD-10-CM | POA: Diagnosis not present

## 2023-12-21 DIAGNOSIS — E1142 Type 2 diabetes mellitus with diabetic polyneuropathy: Secondary | ICD-10-CM | POA: Diagnosis not present

## 2023-12-21 DIAGNOSIS — Z6838 Body mass index (BMI) 38.0-38.9, adult: Secondary | ICD-10-CM | POA: Diagnosis not present

## 2023-12-21 DIAGNOSIS — G6181 Chronic inflammatory demyelinating polyneuritis: Secondary | ICD-10-CM | POA: Diagnosis not present

## 2023-12-21 DIAGNOSIS — G4733 Obstructive sleep apnea (adult) (pediatric): Secondary | ICD-10-CM | POA: Diagnosis not present

## 2023-12-21 DIAGNOSIS — G894 Chronic pain syndrome: Secondary | ICD-10-CM | POA: Diagnosis not present

## 2023-12-21 DIAGNOSIS — F419 Anxiety disorder, unspecified: Secondary | ICD-10-CM | POA: Diagnosis not present

## 2023-12-21 DIAGNOSIS — I2510 Atherosclerotic heart disease of native coronary artery without angina pectoris: Secondary | ICD-10-CM | POA: Diagnosis not present

## 2023-12-21 DIAGNOSIS — E559 Vitamin D deficiency, unspecified: Secondary | ICD-10-CM | POA: Diagnosis not present

## 2023-12-21 DIAGNOSIS — E538 Deficiency of other specified B group vitamins: Secondary | ICD-10-CM | POA: Diagnosis not present

## 2023-12-21 DIAGNOSIS — E11610 Type 2 diabetes mellitus with diabetic neuropathic arthropathy: Secondary | ICD-10-CM | POA: Diagnosis not present

## 2023-12-21 NOTE — Telephone Encounter (Signed)
Rx not listed in active med list.  

## 2023-12-24 ENCOUNTER — Other Ambulatory Visit: Payer: Self-pay | Admitting: Family Medicine

## 2023-12-25 DIAGNOSIS — J449 Chronic obstructive pulmonary disease, unspecified: Secondary | ICD-10-CM

## 2023-12-25 DIAGNOSIS — I110 Hypertensive heart disease with heart failure: Secondary | ICD-10-CM

## 2023-12-25 DIAGNOSIS — I5032 Chronic diastolic (congestive) heart failure: Secondary | ICD-10-CM

## 2023-12-26 DIAGNOSIS — J449 Chronic obstructive pulmonary disease, unspecified: Secondary | ICD-10-CM | POA: Diagnosis not present

## 2023-12-26 DIAGNOSIS — I110 Hypertensive heart disease with heart failure: Secondary | ICD-10-CM | POA: Diagnosis not present

## 2023-12-26 DIAGNOSIS — E1142 Type 2 diabetes mellitus with diabetic polyneuropathy: Secondary | ICD-10-CM | POA: Diagnosis not present

## 2023-12-26 DIAGNOSIS — J9601 Acute respiratory failure with hypoxia: Secondary | ICD-10-CM | POA: Diagnosis not present

## 2023-12-26 DIAGNOSIS — I5032 Chronic diastolic (congestive) heart failure: Secondary | ICD-10-CM | POA: Diagnosis not present

## 2023-12-26 DIAGNOSIS — J9602 Acute respiratory failure with hypercapnia: Secondary | ICD-10-CM | POA: Diagnosis not present

## 2024-01-02 ENCOUNTER — Ambulatory Visit: Payer: Self-pay

## 2024-01-02 DIAGNOSIS — J9602 Acute respiratory failure with hypercapnia: Secondary | ICD-10-CM | POA: Diagnosis not present

## 2024-01-02 DIAGNOSIS — I110 Hypertensive heart disease with heart failure: Secondary | ICD-10-CM | POA: Diagnosis not present

## 2024-01-02 DIAGNOSIS — J9601 Acute respiratory failure with hypoxia: Secondary | ICD-10-CM | POA: Diagnosis not present

## 2024-01-02 DIAGNOSIS — I5032 Chronic diastolic (congestive) heart failure: Secondary | ICD-10-CM | POA: Diagnosis not present

## 2024-01-02 DIAGNOSIS — E1142 Type 2 diabetes mellitus with diabetic polyneuropathy: Secondary | ICD-10-CM | POA: Diagnosis not present

## 2024-01-02 DIAGNOSIS — J449 Chronic obstructive pulmonary disease, unspecified: Secondary | ICD-10-CM | POA: Diagnosis not present

## 2024-01-02 NOTE — Telephone Encounter (Signed)
 Call patient and see what might be going on make sure that she has her Lantus  and that she is injecting it.  Verify how many units we may need to go up by 2 units.

## 2024-01-02 NOTE — Telephone Encounter (Signed)
 Chief Complaint: elevated BG  Pertinent Negatives: Patient denies symptoms Disposition: [] ED /[] Urgent Care (no appt availability in office) / [] Appointment(In office/virtual)/ []  [facility]/ [] Home Care/ [] Refused Recommended Disposition /[] [facility]/ [x]  Follow-up with PCP Additional Notes: [place] PT assistant called states that pt is having some elevated glucose reading. States glucose reading was 409 @ 1500 today. Pt was not able to test glucose this morning. States last meal was around 1300. States that she is usually around 130 fasting in the morning. Can contact pt's daughter or the PT assistant [phone] this is a protected line.   Copied from CRM [id number]. Topic: Clinical - Red Word Triage >> Jan 02, 2024  4:11 PM Brittney F wrote: [facility] that prompted transfer to Nurse Triage:  high blood sugar: 409 Reason for Disposition  Blood glucose > 400 mg/dL (14.7 mmol/L)  Answer Assessment - Initial Assessment Questions 1. BLOOD GLUCOSE: "What is your blood glucose level?"      409 2. ONSET: "When did you check the blood glucose?"     1500  5. TYPE 1 or 2:  "Do you know what type of diabetes you have?"  (e.g., Type 1, Type 2, Gestational; doesn't know)      Type 2 6. INSULIN : "Do you take insulin ?" "What type of insulin (s) do you use? What is the mode of delivery? (syringe, pen; injection or pump)?"      Latus  7. DIABETES PILLS: "Do you take any pills for your diabetes?" If Yes, ask: "Have you missed taking any pills recently?"     glipizide  8. OTHER SYMPTOMS: "Do you have any symptoms?" (e.g., fever, frequent urination, difficulty breathing, dizziness, weakness, vomiting)     no  Protocols used: Diabetes - High Blood Sugar-A-AH

## 2024-01-02 NOTE — Telephone Encounter (Signed)
 Please advise, thanks.

## 2024-01-03 ENCOUNTER — Other Ambulatory Visit: Payer: Self-pay | Admitting: Family Medicine

## 2024-01-03 NOTE — Telephone Encounter (Signed)
 Per patient, no concerns today with her blood sugar. Patient confirmed she is doing 59 units for the Lantus  rx as directed. Patient was notified of the provider's recommendation. Verbalized understanding. No further inquires during the call.

## 2024-01-04 ENCOUNTER — Other Ambulatory Visit: Payer: Self-pay | Admitting: Family Medicine

## 2024-01-07 NOTE — Telephone Encounter (Signed)
 Call patient- last note says "she switched back to her home metformin ". Confirm this is home metformin  with patient.

## 2024-01-07 NOTE — Telephone Encounter (Signed)
 Should pt restart metformin ?

## 2024-01-08 DIAGNOSIS — J449 Chronic obstructive pulmonary disease, unspecified: Secondary | ICD-10-CM | POA: Diagnosis not present

## 2024-01-08 DIAGNOSIS — I5032 Chronic diastolic (congestive) heart failure: Secondary | ICD-10-CM | POA: Diagnosis not present

## 2024-01-08 DIAGNOSIS — I110 Hypertensive heart disease with heart failure: Secondary | ICD-10-CM | POA: Diagnosis not present

## 2024-01-08 DIAGNOSIS — J9602 Acute respiratory failure with hypercapnia: Secondary | ICD-10-CM | POA: Diagnosis not present

## 2024-01-08 DIAGNOSIS — J9601 Acute respiratory failure with hypoxia: Secondary | ICD-10-CM | POA: Diagnosis not present

## 2024-01-08 DIAGNOSIS — E1142 Type 2 diabetes mellitus with diabetic polyneuropathy: Secondary | ICD-10-CM | POA: Diagnosis not present

## 2024-01-08 NOTE — Telephone Encounter (Signed)
 Thank you for clarifying.

## 2024-01-08 NOTE — Telephone Encounter (Signed)
 Contacted the patient. She states Dr. Greer Leak stopped the Metformin  completely and changed her to glipizide  and Lantus .

## 2024-01-14 ENCOUNTER — Ambulatory Visit (INDEPENDENT_AMBULATORY_CARE_PROVIDER_SITE_OTHER): Admitting: Family Medicine

## 2024-01-14 ENCOUNTER — Encounter: Payer: Self-pay | Admitting: Family Medicine

## 2024-01-14 VITALS — BP 106/65 | HR 66

## 2024-01-14 DIAGNOSIS — J441 Chronic obstructive pulmonary disease with (acute) exacerbation: Secondary | ICD-10-CM | POA: Diagnosis not present

## 2024-01-14 DIAGNOSIS — M47816 Spondylosis without myelopathy or radiculopathy, lumbar region: Secondary | ICD-10-CM | POA: Diagnosis not present

## 2024-01-14 DIAGNOSIS — I517 Cardiomegaly: Secondary | ICD-10-CM | POA: Insufficient documentation

## 2024-01-14 DIAGNOSIS — M21372 Foot drop, left foot: Secondary | ICD-10-CM | POA: Diagnosis not present

## 2024-01-14 DIAGNOSIS — M21371 Foot drop, right foot: Secondary | ICD-10-CM | POA: Diagnosis not present

## 2024-01-14 DIAGNOSIS — M542 Cervicalgia: Secondary | ICD-10-CM | POA: Diagnosis not present

## 2024-01-14 DIAGNOSIS — G4720 Circadian rhythm sleep disorder, unspecified type: Secondary | ICD-10-CM | POA: Insufficient documentation

## 2024-01-14 DIAGNOSIS — G6181 Chronic inflammatory demyelinating polyneuritis: Secondary | ICD-10-CM | POA: Diagnosis not present

## 2024-01-14 MED ORDER — TRAZODONE HCL 50 MG PO TABS: 50.0000 mg | ORAL_TABLET | Freq: Every day | ORAL | 0 refills | Status: DC

## 2024-01-14 MED ORDER — VALACYCLOVIR HCL 500 MG PO TABS: 1000.0000 mg | ORAL_TABLET | Freq: Two times a day (BID) | ORAL | 0 refills | Status: DC

## 2024-01-14 MED ORDER — DOXYCYCLINE HYCLATE 100 MG PO TABS: 100.0000 mg | ORAL_TABLET | Freq: Two times a day (BID) | ORAL | 0 refills | Status: DC

## 2024-01-14 NOTE — Assessment & Plan Note (Addendum)
 Increased cough and sputum production but no increased shortness of breath per se though she did feel like she was having a harder time getting a deep breath in.  She is okay with doing a trial of an antibiotic but would prefer to hold off on steroids unless needed.  Will treat with doxycycline  call if not better by the end of the week.  Continue with Trelegy and albuterol  as needed continue to wear oxygen  as needed she does wear it at night.

## 2024-01-14 NOTE — Progress Notes (Signed)
 Pt reports that for the past week she has had a Headache, abdominal pain, diarrhea, tired,and chills.   She stated that "I just don't feel right"  Pt also needs a letter to give to her apartment complex to change her Air filters monthly. She stated that it has been sometime since this has been done.  She feels that she's not able to get in full breaths.

## 2024-01-14 NOTE — Assessment & Plan Note (Signed)
 Having significant sleep problems.  Some nights she goes to bed between 1 AM and 4 AM other nights she will go to bed at 830.  Most nights she does sleep about 5 hours but then she takes long naps during the day to the point where she sometimes sleeping up to 11 hours.  She tries to take her trazodone  around 8:30 at night to help her go to sleep but again it can take hours for her to actually fall asleep she does not drink any caffeine she no longer smokes.  She wants to know if increasing her trazodone  would be helpful.  Did discuss setting a bedtime and trying to be very consistent.  I want her to currently set her bedtime for 1 AM and take her trazodone  around midnight.  Try to stay up and keep her self awake during the day.  If she starts to feel tired or sleepy get up move around, change positions, go sit on the porch etc..  If she does take a nap during the day purposely then try to set a timer for 30 minutes to wake up so that she is not sleeping for hours and then having difficulty falling back asleep.  Her circadian rhythms are completely off track.  I want her to try to stick to this routine for the next 3 to 4 weeks and if she is doing well then we can try to move the bedtime back in 15-minute increments every couple of weeks.  Make sure to wake up at 9 AM every day and do not stay in bed longer than that if she wakes up earlier such as 6 AM then go ahead and get up out of bed and not to stay in bed.

## 2024-01-14 NOTE — Addendum Note (Signed)
 Addended by: Emmaclaire Switala D on: 01/14/2024 04:37 PM   Modules accepted: Orders

## 2024-01-14 NOTE — Progress Notes (Addendum)
 Established Patient Office Visit  Subjective  Patient ID: [name], female    DOB: Dec 20, 1945  Age: 78 y.o. MRN: [id number]  Chief Complaint  Patient presents with   COPD    HPI  Pt reports that for the past week she has had a Headache, abdominal pain, diarrhea, tired,and chills.  Diarrhea is more chronic but the headaches are definitely unusual for her.  She had a headache all day on Saturday and Sunday.  But today it actually feels a little bit better.  Also over the weekend she pretty much stayed inside in the house and just felt like she could not really get a deep breath she ended up putting her oxygen  on which she really only uses as needed and at night, and actually did feel better though she was not truly feeling short of breath.  She does complain though that she has had increased cough and some increased mucus in her chest but just feels like she cannot move it.  No fevers or chills.  Increased cough has been for several days.  She says her blood pressures when she is checked them have been okay   She stated that "I just don't feel right"   Pt also needs a letter to give to her apartment complex to change her Air filters monthly. She stated that it has been sometime since this has been done.   She feels that she's not able to get in full breaths.    Diabetes-blood glucose sugars have been between 120 and 127.  So she says they have been much better controlled.  She would also like a new referral to a local pain management doctor she has been followed [facility] but now that she no longer drives and her daughter is having to take her she wanted to know if she could be seen by her daughters pain specialist who is here in [place] at [facility] Dr. Peterson Brandt.  He did quit smoking but has been unhappy with the weight gain since quitting smoking.  And wanted to know if there was something that could help decrease her appetite.    ROS    Objective:     BP 106/65    Pulse 66   SpO2 99%    Physical Exam Vitals and nursing note reviewed.  Constitutional:      Appearance: Normal appearance.  HENT:     Head: Normocephalic and atraumatic.  Eyes:     Conjunctiva/sclera: Conjunctivae normal.  Cardiovascular:     Rate and Rhythm: Normal rate and regular rhythm.     Heart sounds: Murmur heard.     Comments: 2 out of 6 systolic murmur. Pulmonary:     Effort: Pulmonary effort is normal.     Breath sounds: Normal breath sounds.  Skin:    General: Skin is warm and dry.  Neurological:     Mental Status: She is alert.  Psychiatric:        Mood and Affect: Mood normal.      No results found for any visits on 01/14/24.    The ASCVD Risk score (Arnett DK, et al., 2019) failed to calculate for the following reasons:   Risk score cannot be calculated because patient has a medical history suggesting prior/existing ASCVD    Assessment & Plan:   Problem List Items Addressed This Visit       Cardiovascular and Mediastinum   LVH (left ventricular hypertrophy)   Relevant Orders  ECHOCARDIOGRAM COMPLETE     Respiratory   COPD exacerbation (HCC)   Increased cough and sputum production but no increased shortness of breath per se though she did feel like she was having a harder time getting a deep breath in.  She is okay with doing a trial of an antibiotic but would prefer to hold off on steroids unless needed.  Will treat with doxycycline  call if not better by the end of the week.  Continue with Trelegy and albuterol  as needed continue to wear oxygen  as needed she does wear it at night.        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  Some nights she goes to bed between 1 AM and 4 AM other nights she will go to bed at 830.  Most nights she does sleep about 5 hours but then she takes long naps during the day to the point where she sometimes sleeping up to 11 hours.  She tries to take her trazodone  around 8:30 at night to help her go to sleep but again it can take hours for her to actually fall asleep she does not drink any caffeine she no longer smokes.  She wants to know if increasing her trazodone  would be helpful.  Did discuss setting a bedtime and trying to be very consistent.  I want her to currently set her bedtime for 1 AM and take her trazodone  around midnight.  Try to stay up and keep her self awake during the day.  If she starts to feel tired or sleepy get up move around, change positions, go sit on the porch etc..  If she does take a nap during the day purposely then try to set a timer for 30 minutes to wake up so that she is not sleeping for hours and then having difficulty falling back asleep.  Her circadian rhythms are completely off track.  I want her to try to stick to this routine for the next 3 to 4 weeks and if she is doing well then we can try to move the bedtime back in 15-minute increments every couple of weeks.  Make sure to wake up at 9 AM every day and do not stay in bed longer than that if she wakes up earlier such as 6 AM then go ahead and get up out of bed and not to stay in bed.      Bilateral foot-drop   Relevant Orders   Ambulatory referral to Pain Clinic   I also like to get an updated echocardiogram as I do hear a murmur on exam today last echo was done about 10 years ago and she had some LVH at that time.   Return in about 6 weeks (around 02/25/2024) for Diabetes follow-up.   I spent 45 minutes on the day of the encounter to include pre-visit record review, face-to-face time with the  patient and post visit ordering of test.   Duaine German, MD

## 2024-01-15 DIAGNOSIS — I110 Hypertensive heart disease with heart failure: Secondary | ICD-10-CM | POA: Diagnosis not present

## 2024-01-15 DIAGNOSIS — I5032 Chronic diastolic (congestive) heart failure: Secondary | ICD-10-CM | POA: Diagnosis not present

## 2024-01-15 DIAGNOSIS — J9602 Acute respiratory failure with hypercapnia: Secondary | ICD-10-CM | POA: Diagnosis not present

## 2024-01-15 DIAGNOSIS — E1142 Type 2 diabetes mellitus with diabetic polyneuropathy: Secondary | ICD-10-CM | POA: Diagnosis not present

## 2024-01-15 DIAGNOSIS — J9601 Acute respiratory failure with hypoxia: Secondary | ICD-10-CM | POA: Diagnosis not present

## 2024-01-15 DIAGNOSIS — J449 Chronic obstructive pulmonary disease, unspecified: Secondary | ICD-10-CM | POA: Diagnosis not present

## 2024-01-17 ENCOUNTER — Encounter: Payer: Self-pay | Admitting: Family Medicine

## 2024-01-17 DIAGNOSIS — Z961 Presence of intraocular lens: Secondary | ICD-10-CM | POA: Diagnosis not present

## 2024-01-17 DIAGNOSIS — H353134 Nonexudative age-related macular degeneration, bilateral, advanced atrophic with subfoveal involvement: Secondary | ICD-10-CM | POA: Diagnosis not present

## 2024-01-17 DIAGNOSIS — H02831 Dermatochalasis of right upper eyelid: Secondary | ICD-10-CM | POA: Diagnosis not present

## 2024-01-17 DIAGNOSIS — E119 Type 2 diabetes mellitus without complications: Secondary | ICD-10-CM | POA: Diagnosis not present

## 2024-01-17 DIAGNOSIS — J449 Chronic obstructive pulmonary disease, unspecified: Secondary | ICD-10-CM

## 2024-01-17 DIAGNOSIS — H02834 Dermatochalasis of left upper eyelid: Secondary | ICD-10-CM | POA: Diagnosis not present

## 2024-01-17 MED ORDER — TRELEGY ELLIPTA 100-62.5-25 MCG/ACT IN AEPB: 1.0000 | INHALATION_SPRAY | Freq: Every day | RESPIRATORY_TRACT | 0 refills | Status: DC

## 2024-01-17 MED ORDER — FUROSEMIDE 40 MG PO TABS: 40.0000 mg | ORAL_TABLET | Freq: Every day | ORAL | 0 refills | Status: DC

## 2024-01-17 NOTE — Telephone Encounter (Signed)
 Daughter wanted to be notified when done.

## 2024-01-17 NOTE — Telephone Encounter (Signed)
 Patient daughter calling for patient  Requesting rx rf of  Trelegy   Already refilled per protocol  And  Furosemide   Last written 01/03/2024 Last OV 01/14/2024 Upcoming appt 02/28/2024

## 2024-01-18 NOTE — Telephone Encounter (Signed)
 Patient daughter [name] informed.

## 2024-01-19 ENCOUNTER — Other Ambulatory Visit: Payer: Self-pay | Admitting: Family Medicine

## 2024-01-20 DIAGNOSIS — J449 Chronic obstructive pulmonary disease, unspecified: Secondary | ICD-10-CM | POA: Diagnosis not present

## 2024-01-20 DIAGNOSIS — R339 Retention of urine, unspecified: Secondary | ICD-10-CM | POA: Diagnosis not present

## 2024-01-20 DIAGNOSIS — I2510 Atherosclerotic heart disease of native coronary artery without angina pectoris: Secondary | ICD-10-CM | POA: Diagnosis not present

## 2024-01-20 DIAGNOSIS — E559 Vitamin D deficiency, unspecified: Secondary | ICD-10-CM | POA: Diagnosis not present

## 2024-01-20 DIAGNOSIS — I110 Hypertensive heart disease with heart failure: Secondary | ICD-10-CM | POA: Diagnosis not present

## 2024-01-20 DIAGNOSIS — E538 Deficiency of other specified B group vitamins: Secondary | ICD-10-CM | POA: Diagnosis not present

## 2024-01-20 DIAGNOSIS — K589 Irritable bowel syndrome without diarrhea: Secondary | ICD-10-CM | POA: Diagnosis not present

## 2024-01-20 DIAGNOSIS — E11610 Type 2 diabetes mellitus with diabetic neuropathic arthropathy: Secondary | ICD-10-CM | POA: Diagnosis not present

## 2024-01-20 DIAGNOSIS — F5101 Primary insomnia: Secondary | ICD-10-CM | POA: Diagnosis not present

## 2024-01-20 DIAGNOSIS — F419 Anxiety disorder, unspecified: Secondary | ICD-10-CM | POA: Diagnosis not present

## 2024-01-20 DIAGNOSIS — I959 Hypotension, unspecified: Secondary | ICD-10-CM | POA: Diagnosis not present

## 2024-01-20 DIAGNOSIS — J9601 Acute respiratory failure with hypoxia: Secondary | ICD-10-CM | POA: Diagnosis not present

## 2024-01-20 DIAGNOSIS — H3530 Unspecified macular degeneration: Secondary | ICD-10-CM | POA: Diagnosis not present

## 2024-01-20 DIAGNOSIS — M810 Age-related osteoporosis without current pathological fracture: Secondary | ICD-10-CM | POA: Diagnosis not present

## 2024-01-20 DIAGNOSIS — Z6838 Body mass index (BMI) 38.0-38.9, adult: Secondary | ICD-10-CM | POA: Diagnosis not present

## 2024-01-20 DIAGNOSIS — F329 Major depressive disorder, single episode, unspecified: Secondary | ICD-10-CM | POA: Diagnosis not present

## 2024-01-20 DIAGNOSIS — E66812 Obesity, class 2: Secondary | ICD-10-CM | POA: Diagnosis not present

## 2024-01-20 DIAGNOSIS — E039 Hypothyroidism, unspecified: Secondary | ICD-10-CM | POA: Diagnosis not present

## 2024-01-20 DIAGNOSIS — E1142 Type 2 diabetes mellitus with diabetic polyneuropathy: Secondary | ICD-10-CM | POA: Diagnosis not present

## 2024-01-20 DIAGNOSIS — I5032 Chronic diastolic (congestive) heart failure: Secondary | ICD-10-CM | POA: Diagnosis not present

## 2024-01-20 DIAGNOSIS — J9602 Acute respiratory failure with hypercapnia: Secondary | ICD-10-CM | POA: Diagnosis not present

## 2024-01-20 DIAGNOSIS — G894 Chronic pain syndrome: Secondary | ICD-10-CM | POA: Diagnosis not present

## 2024-01-20 DIAGNOSIS — Z794 Long term (current) use of insulin: Secondary | ICD-10-CM | POA: Diagnosis not present

## 2024-01-20 DIAGNOSIS — G4733 Obstructive sleep apnea (adult) (pediatric): Secondary | ICD-10-CM | POA: Diagnosis not present

## 2024-01-20 DIAGNOSIS — G6181 Chronic inflammatory demyelinating polyneuritis: Secondary | ICD-10-CM | POA: Diagnosis not present

## 2024-01-21 ENCOUNTER — Other Ambulatory Visit: Payer: Self-pay | Admitting: Family Medicine

## 2024-01-21 ENCOUNTER — Encounter: Payer: Self-pay | Admitting: Family Medicine

## 2024-01-21 DIAGNOSIS — E1140 Type 2 diabetes mellitus with diabetic neuropathy, unspecified: Secondary | ICD-10-CM

## 2024-01-21 DIAGNOSIS — R42 Dizziness and giddiness: Secondary | ICD-10-CM

## 2024-01-21 MED ORDER — PANTOPRAZOLE SODIUM 40 MG PO TBEC: 40.0000 mg | DELAYED_RELEASE_TABLET | Freq: Every day | ORAL | 0 refills | Status: DC

## 2024-01-21 MED ORDER — LANTUS SOLOSTAR 100 UNIT/ML ~~LOC~~ SOPN: PEN_INJECTOR | SUBCUTANEOUS | 4 refills | Status: DC

## 2024-01-21 NOTE — Telephone Encounter (Signed)
 Requesting rx rf of  Lantus   Last written 12/06/2023 And  Protonix   Last written 12/21/2023 Last OV 01/14/2024 Upcoming appt 02/28/2024

## 2024-01-21 NOTE — Telephone Encounter (Signed)
 Meds ordered this encounter  Medications   insulin  glargine (LANTUS  SOLOSTAR) 100 UNIT/ML Solostar Pen    Sig: Inject thirty Units into the skin 2 (two) times daily for 30 days.    Dispense:  30 mL    Refill:  4   pantoprazole  (PROTONIX ) 40 MG tablet    Sig: Take 1 tablet (40 mg total) by mouth daily.    Dispense:  90 tablet    Refill:  0

## 2024-01-22 DIAGNOSIS — J449 Chronic obstructive pulmonary disease, unspecified: Secondary | ICD-10-CM | POA: Diagnosis not present

## 2024-01-22 DIAGNOSIS — I110 Hypertensive heart disease with heart failure: Secondary | ICD-10-CM | POA: Diagnosis not present

## 2024-01-22 DIAGNOSIS — J9602 Acute respiratory failure with hypercapnia: Secondary | ICD-10-CM | POA: Diagnosis not present

## 2024-01-22 DIAGNOSIS — J9601 Acute respiratory failure with hypoxia: Secondary | ICD-10-CM | POA: Diagnosis not present

## 2024-01-22 DIAGNOSIS — G4733 Obstructive sleep apnea (adult) (pediatric): Secondary | ICD-10-CM | POA: Diagnosis not present

## 2024-01-22 DIAGNOSIS — E1142 Type 2 diabetes mellitus with diabetic polyneuropathy: Secondary | ICD-10-CM | POA: Diagnosis not present

## 2024-01-22 DIAGNOSIS — I5032 Chronic diastolic (congestive) heart failure: Secondary | ICD-10-CM | POA: Diagnosis not present

## 2024-01-22 NOTE — Telephone Encounter (Signed)
 Please advise on refill request

## 2024-01-29 ENCOUNTER — Ambulatory Visit: Payer: Self-pay

## 2024-01-29 DIAGNOSIS — J9601 Acute respiratory failure with hypoxia: Secondary | ICD-10-CM | POA: Diagnosis not present

## 2024-01-29 DIAGNOSIS — I5032 Chronic diastolic (congestive) heart failure: Secondary | ICD-10-CM | POA: Diagnosis not present

## 2024-01-29 DIAGNOSIS — E1142 Type 2 diabetes mellitus with diabetic polyneuropathy: Secondary | ICD-10-CM | POA: Diagnosis not present

## 2024-01-29 DIAGNOSIS — J9602 Acute respiratory failure with hypercapnia: Secondary | ICD-10-CM | POA: Diagnosis not present

## 2024-01-29 DIAGNOSIS — I110 Hypertensive heart disease with heart failure: Secondary | ICD-10-CM | POA: Diagnosis not present

## 2024-01-29 DIAGNOSIS — J449 Chronic obstructive pulmonary disease, unspecified: Secondary | ICD-10-CM | POA: Diagnosis not present

## 2024-01-29 NOTE — Telephone Encounter (Signed)
 FYI Only or Action Required?: FYI only for provider.  Patient was last seen in primary care on 01/14/2024 by Alvan Dorothyann BIRCH, MD. Called Nurse Triage reporting Fall. Symptoms began a week ago. Interventions attempted: Nothing. Symptoms are: gradually worsening.  Triage Disposition: See PCP When Office is Open (Within 3 Days)  Patient/caregiver understands and will follow disposition?: Yes, will follow disposition  Copied from CRM [id number]. Topic: Clinical - Red Word Triage >> Jan 29, 2024  2:15 PM Fredrica W wrote: Red Word that prompted transfer to Nurse Triage: patient has had several calls. Not able to remember    ----------------------------------------------------------------------- From previous Reason for Contact - Home Health Verbal Orders: Caller/Agency:  Callback Number:  Service Requested: Physical Therapy Assistant - [phone] Frequency:  Any new concerns about the patient? Reason for Disposition  MILD weakness (i.e., does not interfere with ability to work, go to school, normal activities)  (Exception: Mild weakness is a chronic symptom.)  Answer Assessment - Initial Assessment Questions 1. MECHANISM: How did the fall happen?     Mechanical, pt was evaluated by EMS for all 3 falls.  2. DOMESTIC VIOLENCE AND ELDER ABUSE SCREENING: Did you fall because someone pushed you or tried to hurt you? If Yes, ask: Are you safe now?     denies 3. ONSET: When did the fall happen? (e.g., minutes, hours, or days ago)     [name] was most recent fall. Was attempting to put pants on and fell on L side.  4. LOCATION: What part of the body hit the ground? (e.g., back, buttocks, head, hips, knees, hands, head, stomach)     Head, hip, ribs, no bruising at this time 5. INJURY: Did you hurt (injure) yourself when you fell? If Yes, ask: What did you injure? Tell me more about this? (e.g., body area; type of injury; pain severity)     Generalized pain worse than her usual  arthritis 6. PAIN: Is there any pain? If Yes, ask: How bad is the pain? (e.g., Scale 1-10; or mild,  moderate, severe)   - NONE (0): No pain   - MILD (1-3): Doesn't interfere with normal activities    - MODERATE (4-7): Interferes with normal activities or awakens from sleep    - SEVERE (8-10): Excruciating pain, unable to do any normal activities      Severe, 8 7. SIZE: For cuts, bruises, or swelling, ask: How large is it? (e.g., inches or centimeters)      denies 9. OTHER SYMPTOMS: Do you have any other symptoms? (e.g., dizziness, fever, weakness; new onset or worsening).      Dizziness-occasionally.  PTA [place] calling stating that pt has had increase in falls, including 3 in the last 7 days. 2 times she hit her head. Pt has been having mechanical falls. Pt sugars have been running in the 300s. Pt has also been having to use O2 more regularly, and that she is having worsening SOB. Per PTA, R lower lobe wheezing. Pt states that she feels like she has sputum, but cannot clear it. Pt offered appt tomorrow but declined d/t transportation needing to be prescheduled more than 24 hours before appts.  Protocols used: Falls and [facility]

## 2024-01-31 ENCOUNTER — Ambulatory Visit: Admitting: Family Medicine

## 2024-02-03 ENCOUNTER — Other Ambulatory Visit: Payer: Self-pay | Admitting: Family Medicine

## 2024-02-04 ENCOUNTER — Encounter: Payer: Self-pay | Admitting: Family Medicine

## 2024-02-04 DIAGNOSIS — Z79891 Long term (current) use of opiate analgesic: Secondary | ICD-10-CM | POA: Diagnosis not present

## 2024-02-04 DIAGNOSIS — G894 Chronic pain syndrome: Secondary | ICD-10-CM | POA: Diagnosis not present

## 2024-02-04 DIAGNOSIS — M533 Sacrococcygeal disorders, not elsewhere classified: Secondary | ICD-10-CM | POA: Diagnosis not present

## 2024-02-04 DIAGNOSIS — M5136 Other intervertebral disc degeneration, lumbar region with discogenic back pain only: Secondary | ICD-10-CM | POA: Diagnosis not present

## 2024-02-04 DIAGNOSIS — G6181 Chronic inflammatory demyelinating polyneuritis: Secondary | ICD-10-CM | POA: Diagnosis not present

## 2024-02-04 MED ORDER — GLIPIZIDE ER 2.5 MG PO TB24: 2.5000 mg | ORAL_TABLET | Freq: Every day | ORAL | 0 refills | Status: DC

## 2024-02-06 DIAGNOSIS — E1142 Type 2 diabetes mellitus with diabetic polyneuropathy: Secondary | ICD-10-CM | POA: Diagnosis not present

## 2024-02-06 DIAGNOSIS — J449 Chronic obstructive pulmonary disease, unspecified: Secondary | ICD-10-CM | POA: Diagnosis not present

## 2024-02-06 DIAGNOSIS — I5032 Chronic diastolic (congestive) heart failure: Secondary | ICD-10-CM | POA: Diagnosis not present

## 2024-02-06 DIAGNOSIS — J9602 Acute respiratory failure with hypercapnia: Secondary | ICD-10-CM | POA: Diagnosis not present

## 2024-02-06 DIAGNOSIS — I110 Hypertensive heart disease with heart failure: Secondary | ICD-10-CM | POA: Diagnosis not present

## 2024-02-06 DIAGNOSIS — J9601 Acute respiratory failure with hypoxia: Secondary | ICD-10-CM | POA: Diagnosis not present

## 2024-02-06 NOTE — Addendum Note (Signed)
 Addended by: FREYA BASCOM CROME on: 02/06/2024 04:48 PM   Modules accepted: Orders

## 2024-02-07 ENCOUNTER — Ambulatory Visit: Admitting: Family Medicine

## 2024-02-07 ENCOUNTER — Encounter: Payer: Self-pay | Admitting: Podiatry

## 2024-02-07 ENCOUNTER — Ambulatory Visit: Admitting: Podiatry

## 2024-02-07 DIAGNOSIS — M79675 Pain in left toe(s): Secondary | ICD-10-CM | POA: Diagnosis not present

## 2024-02-07 DIAGNOSIS — E1142 Type 2 diabetes mellitus with diabetic polyneuropathy: Secondary | ICD-10-CM | POA: Diagnosis not present

## 2024-02-07 DIAGNOSIS — B351 Tinea unguium: Secondary | ICD-10-CM | POA: Diagnosis not present

## 2024-02-07 DIAGNOSIS — M79674 Pain in right toe(s): Secondary | ICD-10-CM | POA: Diagnosis not present

## 2024-02-07 NOTE — Progress Notes (Signed)
  Subjective:  Patient ID: [name], female    DOB: Mar 03, 1946,   MRN: [id number]  Chief Complaint  Patient presents with   Diabetes    Cut my toenails.  Saw Dr. Dorothyann Byars - 01/14/2024; A1c -     78 y.o. female presents for concern of thickened elongated and painful nails that are difficult to trim. Requesting to have them trimmed today. Denies any burning or tingling in her feet. Patient is diabetic and last A1c was 7.3  . Denies any other pedal complaints. Denies n/v/f/c.   PCP: Dorothyann Byars MD   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 CFT <3 seconds. Absent hair growth on digits. Edema noted to bilateral lower extremities. Xerosis noted bilaterally.  Skin. No lacerations or abrasions bilateral feet. Nails 1-5 bilateral  are thickened discolored and elongated with subungual debris. Maceration noted in 3rd and fourth interspaced bilateral.  Musculoskeletal: MMT 5/5 bilateral lower extremities in DF, PF, Inversion and Eversion. Deceased ROM in DF of ankle joint.  Neurological:  Sensation intact to light touch. Protective sensation diminished bilateral.    Assessment:   1. Pain due to onychomycosis of toenails of both feet   2. Type 2 diabetes mellitus with diabetic polyneuropathy, unspecified whether long term insulin  use (HCC)        Plan:  Patient was evaluated and treated and all questions answered. -Discussed and educated patient on diabetic foot care, especially with  regards to the vascular, neurological and musculoskeletal systems.  -Stressed the importance of good glycemic control and the detriment of not  controlling glucose levels in relation to the foot. -Discussed supportive shoes at all times and checking feet regularly.  -Mechanically debrided all nails 1-5 bilateral using sterile nail nipper and filed with dremel without incident  -Answered all patient questions -Patient to return  in 3 months for at risk foot care -Patient advised to call the office if any problems or questions arise in the meantime.    Asberry Failing, DPM

## 2024-02-12 ENCOUNTER — Ambulatory Visit (INDEPENDENT_AMBULATORY_CARE_PROVIDER_SITE_OTHER): Admitting: Urgent Care

## 2024-02-12 ENCOUNTER — Encounter: Payer: Self-pay | Admitting: Urgent Care

## 2024-02-12 ENCOUNTER — Ambulatory Visit

## 2024-02-12 ENCOUNTER — Ambulatory Visit: Payer: Self-pay | Admitting: Urgent Care

## 2024-02-12 VITALS — BP 94/71 | HR 80 | Resp 16 | Ht 67.0 in

## 2024-02-12 DIAGNOSIS — E1140 Type 2 diabetes mellitus with diabetic neuropathy, unspecified: Secondary | ICD-10-CM | POA: Diagnosis not present

## 2024-02-12 DIAGNOSIS — Z7984 Long term (current) use of oral hypoglycemic drugs: Secondary | ICD-10-CM | POA: Diagnosis not present

## 2024-02-12 DIAGNOSIS — I951 Orthostatic hypotension: Secondary | ICD-10-CM

## 2024-02-12 DIAGNOSIS — R29898 Other symptoms and signs involving the musculoskeletal system: Secondary | ICD-10-CM

## 2024-02-12 DIAGNOSIS — J449 Chronic obstructive pulmonary disease, unspecified: Secondary | ICD-10-CM

## 2024-02-12 DIAGNOSIS — R9389 Abnormal findings on diagnostic imaging of other specified body structures: Secondary | ICD-10-CM | POA: Diagnosis not present

## 2024-02-12 DIAGNOSIS — R5383 Other fatigue: Secondary | ICD-10-CM

## 2024-02-12 DIAGNOSIS — R600 Localized edema: Secondary | ICD-10-CM

## 2024-02-12 DIAGNOSIS — E039 Hypothyroidism, unspecified: Secondary | ICD-10-CM

## 2024-02-12 DIAGNOSIS — I700 Atherosclerosis of aorta: Secondary | ICD-10-CM | POA: Diagnosis not present

## 2024-02-12 DIAGNOSIS — R053 Chronic cough: Secondary | ICD-10-CM | POA: Diagnosis not present

## 2024-02-12 DIAGNOSIS — R918 Other nonspecific abnormal finding of lung field: Secondary | ICD-10-CM | POA: Diagnosis not present

## 2024-02-12 DIAGNOSIS — R059 Cough, unspecified: Secondary | ICD-10-CM | POA: Diagnosis not present

## 2024-02-12 NOTE — Patient Instructions (Signed)
 Please maintain hydration to ensure BP is not dropping. Continue midodrine for now as ordered.  We drew labs to assess why you continue to feel weak. Please go downstairs to [address] to get a chest xray.  We will notify you with the results once available.  Transition from wheelchair to chair/ commode slowly, keep center of gravity over hips.  Return in 1-2 weeks for follow up/ recheck

## 2024-02-12 NOTE — Progress Notes (Unsigned)
   Established Patient Office Visit  Subjective:  Patient ID: [name], female    DOB: 04-10-1946  Age: 78 y.o. MRN: [id number]  Chief Complaint  Patient presents with   Fall    Increased pt states she has fallen 3xs in the last wk and a half.     HPI  Discussed the use of AI scribe software for clinical note transcription with the patient, who gave verbal consent to proceed.  History of Present Illness   [name] is a 78 year old female with COPD and diabetes who presents with recent falls and leg weakness. She is accompanied by her daughter, who lives ten minutes away.  She has experienced three falls within the past week. One fall occurred in the bathroom while transitioning from her wheelchair to the commode, during which her legs 'just gave out.' Another fall happened in the dining room while bending over to pick something up, resulting in her falling forward.  She has a history of COPD and was hospitalized from January to March due to a severe flare-up. Her cough has worsened recently, and she was prescribed a five-day course of antibiotics three weeks ago, which did not improve her symptoms. Her leg strength has decreased, impacting her ability to perform physical therapy exercises such as sits and stands.  She has diabetes, with her last A1c recorded at 7.3. Blood sugar levels range from 117 to 180, and she does not follow a strict diet. She receives meals on wheels and typically eats her lunch shortly before her blood sugar is checked by her physical therapist, who has noted elevated levels post-meal.  She takes midodrine daily for blood pressure management and attempts to stay hydrated by drinking two to three 16-ounce glasses of water daily, along with a caffeinated coffee in the morning. She also takes thyroid  medication, 112 mcg, with an additional tablet on Sundays, although she often forgets the extra dose. Her thyroid  levels were noted to be very elevated in  March.  She primarily uses a wheelchair at home, only standing briefly when necessary, such as reaching for items in the kitchen. She ensures her wheelchair is nearby to prevent falls. She does not check her blood pressure at home due to vision issues.  No abdominal discomfort or belly pain. Generalized weakness primarily in her legs and tires more easily than before. No shortness of breath at present. The swelling in her legs is not worse than usual.      {History (Optional):23778}  ROS: as noted in HPI  Objective:     BP (!) 103/57 (BP Location: Left Arm, Patient Position: Sitting, Cuff Size: Large)   Pulse 80   Resp 16   Ht 5' 7 (1.702 m)   SpO2 96%   BMI 36.18 kg/m  {Vitals History (Optional):23777}  Physical Exam   No results found for any visits on 02/12/24.  {Labs (Optional):23779}  The ASCVD Risk score (Arnett DK, et al., 2019) failed to calculate for the following reasons:   Risk score cannot be calculated because patient has a medical history suggesting prior/existing ASCVD  Assessment & Plan:  There are no diagnoses linked to this encounter.   No follow-ups on file.   Benton LITTIE Gave, PA

## 2024-02-13 ENCOUNTER — Encounter: Payer: Self-pay | Admitting: Urgent Care

## 2024-02-13 LAB — BRAIN NATRIURETIC PEPTIDE: BNP: 6.1 pg/mL (ref 0.0–100.0)

## 2024-02-13 LAB — CMP14+EGFR
ALT: 11 IU/L (ref 0–32)
AST: 17 IU/L (ref 0–40)
Albumin: 3.9 g/dL (ref 3.8–4.8)
Alkaline Phosphatase: 90 IU/L (ref 44–121)
BUN/Creatinine Ratio: 18 (ref 12–28)
BUN: 20 mg/dL (ref 8–27)
Bilirubin Total: 1.1 mg/dL (ref 0.0–1.2)
CO2: 25 mmol/L (ref 20–29)
Calcium: 9.8 mg/dL (ref 8.7–10.3)
Chloride: 97 mmol/L (ref 96–106)
Creatinine, Ser: 1.14 mg/dL — ABNORMAL HIGH (ref 0.57–1.00)
Globulin, Total: 4.3 g/dL (ref 1.5–4.5)
Glucose: 131 mg/dL — ABNORMAL HIGH (ref 70–99)
Potassium: 5 mmol/L (ref 3.5–5.2)
Sodium: 138 mmol/L (ref 134–144)
Total Protein: 8.2 g/dL (ref 6.0–8.5)
eGFR: 49 mL/min/1.73 — ABNORMAL LOW (ref >59)

## 2024-02-13 LAB — CBC WITH DIFFERENTIAL/PLATELET
Basophils Absolute: 0 x10E3/uL (ref 0.0–0.2)
Basos: 1 %
EOS (ABSOLUTE): 0.1 x10E3/uL (ref 0.0–0.4)
Eos: 2 %
Hematocrit: 39.5 % (ref 34.0–46.6)
Hemoglobin: 12.6 g/dL (ref 11.1–15.9)
Immature Grans (Abs): 0 x10E3/uL (ref 0.0–0.1)
Immature Granulocytes: 0 %
Lymphocytes Absolute: 1 x10E3/uL (ref 0.7–3.1)
Lymphs: 23 %
MCH: 32.6 pg (ref 26.6–33.0)
MCHC: 31.9 g/dL (ref 31.5–35.7)
MCV: 102 fL — ABNORMAL HIGH (ref 79–97)
Monocytes Absolute: 0.4 x10E3/uL (ref 0.1–0.9)
Monocytes: 10 %
Neutrophils Absolute: 2.8 x10E3/uL (ref 1.4–7.0)
Neutrophils: 64 %
Platelets: 179 x10E3/uL (ref 150–450)
RBC: 3.86 x10E6/uL (ref 3.77–5.28)
RDW: 15.3 % (ref 11.7–15.4)
WBC: 4.3 x10E3/uL (ref 3.4–10.8)

## 2024-02-13 LAB — TSH+FREE T4
Free T4: 0.93 ng/dL (ref 0.82–1.77)
TSH: 8.16 u[IU]/mL — ABNORMAL HIGH (ref 0.450–4.500)

## 2024-02-13 LAB — HEMOGLOBIN A1C
Est. average glucose Bld gHb Est-mCnc: 163 mg/dL
Hgb A1c MFr Bld: 7.3 % — ABNORMAL HIGH (ref 4.8–5.6)

## 2024-02-13 MED ORDER — LEVOTHYROXINE SODIUM 125 MCG PO TABS: 125.0000 ug | ORAL_TABLET | Freq: Every day | ORAL | 0 refills | Status: DC

## 2024-02-14 DIAGNOSIS — I110 Hypertensive heart disease with heart failure: Secondary | ICD-10-CM | POA: Diagnosis not present

## 2024-02-14 DIAGNOSIS — J9601 Acute respiratory failure with hypoxia: Secondary | ICD-10-CM | POA: Diagnosis not present

## 2024-02-14 DIAGNOSIS — I5032 Chronic diastolic (congestive) heart failure: Secondary | ICD-10-CM | POA: Diagnosis not present

## 2024-02-14 DIAGNOSIS — E1142 Type 2 diabetes mellitus with diabetic polyneuropathy: Secondary | ICD-10-CM | POA: Diagnosis not present

## 2024-02-14 DIAGNOSIS — J449 Chronic obstructive pulmonary disease, unspecified: Secondary | ICD-10-CM | POA: Diagnosis not present

## 2024-02-14 DIAGNOSIS — J9602 Acute respiratory failure with hypercapnia: Secondary | ICD-10-CM | POA: Diagnosis not present

## 2024-02-14 NOTE — Telephone Encounter (Signed)
 Copied from CRM [id number]. Topic: Clinical - Home Health Verbal Orders >> Feb 14, 2024  2:49 PM Graeme ORN wrote: Caller/Agency: [facility]  Callback Number: [phone] has confidential  Service Requested: Physical Therapy Frequency: start next week once per week for 9 weeks pt maintenance program  Any new concerns about the patient? No

## 2024-02-15 NOTE — Progress Notes (Signed)
 Okay to give verbal orders for home PT.

## 2024-02-19 DIAGNOSIS — E039 Hypothyroidism, unspecified: Secondary | ICD-10-CM | POA: Diagnosis not present

## 2024-02-19 DIAGNOSIS — F5101 Primary insomnia: Secondary | ICD-10-CM | POA: Diagnosis not present

## 2024-02-19 DIAGNOSIS — F419 Anxiety disorder, unspecified: Secondary | ICD-10-CM | POA: Diagnosis not present

## 2024-02-19 DIAGNOSIS — Z87891 Personal history of nicotine dependence: Secondary | ICD-10-CM | POA: Diagnosis not present

## 2024-02-19 DIAGNOSIS — Z7982 Long term (current) use of aspirin: Secondary | ICD-10-CM | POA: Diagnosis not present

## 2024-02-19 DIAGNOSIS — Z9181 History of falling: Secondary | ICD-10-CM | POA: Diagnosis not present

## 2024-02-19 DIAGNOSIS — I2510 Atherosclerotic heart disease of native coronary artery without angina pectoris: Secondary | ICD-10-CM | POA: Diagnosis not present

## 2024-02-19 DIAGNOSIS — G4733 Obstructive sleep apnea (adult) (pediatric): Secondary | ICD-10-CM | POA: Diagnosis not present

## 2024-02-19 DIAGNOSIS — Z556 Problems related to health literacy: Secondary | ICD-10-CM | POA: Diagnosis not present

## 2024-02-19 DIAGNOSIS — E1142 Type 2 diabetes mellitus with diabetic polyneuropathy: Secondary | ICD-10-CM | POA: Diagnosis not present

## 2024-02-19 DIAGNOSIS — Z794 Long term (current) use of insulin: Secondary | ICD-10-CM | POA: Diagnosis not present

## 2024-02-19 DIAGNOSIS — I130 Hypertensive heart and chronic kidney disease with heart failure and stage 1 through stage 4 chronic kidney disease, or unspecified chronic kidney disease: Secondary | ICD-10-CM | POA: Diagnosis not present

## 2024-02-19 DIAGNOSIS — E11610 Type 2 diabetes mellitus with diabetic neuropathic arthropathy: Secondary | ICD-10-CM | POA: Diagnosis not present

## 2024-02-19 DIAGNOSIS — E785 Hyperlipidemia, unspecified: Secondary | ICD-10-CM | POA: Diagnosis not present

## 2024-02-19 DIAGNOSIS — J441 Chronic obstructive pulmonary disease with (acute) exacerbation: Secondary | ICD-10-CM | POA: Diagnosis not present

## 2024-02-19 DIAGNOSIS — I5032 Chronic diastolic (congestive) heart failure: Secondary | ICD-10-CM | POA: Diagnosis not present

## 2024-02-19 DIAGNOSIS — K589 Irritable bowel syndrome without diarrhea: Secondary | ICD-10-CM | POA: Diagnosis not present

## 2024-02-19 DIAGNOSIS — H3530 Unspecified macular degeneration: Secondary | ICD-10-CM | POA: Diagnosis not present

## 2024-02-19 DIAGNOSIS — Z6838 Body mass index (BMI) 38.0-38.9, adult: Secondary | ICD-10-CM | POA: Diagnosis not present

## 2024-02-19 DIAGNOSIS — G894 Chronic pain syndrome: Secondary | ICD-10-CM | POA: Diagnosis not present

## 2024-02-19 DIAGNOSIS — F329 Major depressive disorder, single episode, unspecified: Secondary | ICD-10-CM | POA: Diagnosis not present

## 2024-02-19 DIAGNOSIS — E66812 Obesity, class 2: Secondary | ICD-10-CM | POA: Diagnosis not present

## 2024-02-19 DIAGNOSIS — M810 Age-related osteoporosis without current pathological fracture: Secondary | ICD-10-CM | POA: Diagnosis not present

## 2024-02-19 DIAGNOSIS — E538 Deficiency of other specified B group vitamins: Secondary | ICD-10-CM | POA: Diagnosis not present

## 2024-02-19 DIAGNOSIS — G6181 Chronic inflammatory demyelinating polyneuritis: Secondary | ICD-10-CM | POA: Diagnosis not present

## 2024-02-22 DIAGNOSIS — I130 Hypertensive heart and chronic kidney disease with heart failure and stage 1 through stage 4 chronic kidney disease, or unspecified chronic kidney disease: Secondary | ICD-10-CM | POA: Diagnosis not present

## 2024-02-22 DIAGNOSIS — J441 Chronic obstructive pulmonary disease with (acute) exacerbation: Secondary | ICD-10-CM | POA: Diagnosis not present

## 2024-02-22 DIAGNOSIS — I5032 Chronic diastolic (congestive) heart failure: Secondary | ICD-10-CM | POA: Diagnosis not present

## 2024-02-27 DIAGNOSIS — I5032 Chronic diastolic (congestive) heart failure: Secondary | ICD-10-CM | POA: Diagnosis not present

## 2024-02-27 DIAGNOSIS — I130 Hypertensive heart and chronic kidney disease with heart failure and stage 1 through stage 4 chronic kidney disease, or unspecified chronic kidney disease: Secondary | ICD-10-CM | POA: Diagnosis not present

## 2024-02-27 DIAGNOSIS — J441 Chronic obstructive pulmonary disease with (acute) exacerbation: Secondary | ICD-10-CM | POA: Diagnosis not present

## 2024-02-27 DIAGNOSIS — E1142 Type 2 diabetes mellitus with diabetic polyneuropathy: Secondary | ICD-10-CM | POA: Diagnosis not present

## 2024-02-27 DIAGNOSIS — E11610 Type 2 diabetes mellitus with diabetic neuropathic arthropathy: Secondary | ICD-10-CM | POA: Diagnosis not present

## 2024-02-27 DIAGNOSIS — G6181 Chronic inflammatory demyelinating polyneuritis: Secondary | ICD-10-CM | POA: Diagnosis not present

## 2024-02-28 ENCOUNTER — Ambulatory Visit (INDEPENDENT_AMBULATORY_CARE_PROVIDER_SITE_OTHER): Admitting: Family Medicine

## 2024-02-28 ENCOUNTER — Encounter: Payer: Self-pay | Admitting: Family Medicine

## 2024-02-28 VITALS — BP 108/66 | HR 79

## 2024-02-28 DIAGNOSIS — I951 Orthostatic hypotension: Secondary | ICD-10-CM

## 2024-02-28 DIAGNOSIS — E039 Hypothyroidism, unspecified: Secondary | ICD-10-CM

## 2024-02-28 DIAGNOSIS — N1832 Chronic kidney disease, stage 3b: Secondary | ICD-10-CM | POA: Diagnosis not present

## 2024-02-28 DIAGNOSIS — Z7984 Long term (current) use of oral hypoglycemic drugs: Secondary | ICD-10-CM

## 2024-02-28 DIAGNOSIS — E1140 Type 2 diabetes mellitus with diabetic neuropathy, unspecified: Secondary | ICD-10-CM | POA: Diagnosis not present

## 2024-02-28 MED ORDER — TIRZEPATIDE 5 MG/0.5ML ~~LOC~~ SOAJ
5.0000 mg | SUBCUTANEOUS | 0 refills | Status: DC
Start: 2024-03-27 — End: 2024-04-25

## 2024-02-28 MED ORDER — TIRZEPATIDE 2.5 MG/0.5ML ~~LOC~~ SOAJ: 2.5000 mg | SUBCUTANEOUS | 0 refills | Status: DC

## 2024-02-28 NOTE — Assessment & Plan Note (Addendum)
 A1C looks ok  at 7.2 discussed starting GLP-1 and work to get her down on her insulin .  I think this would help with ease of use.  And being that she is legally blind it makes it also difficult for her to administer insulin  herself and be able to count and hear the clinics.  I think a device like the Mounjaro  would be helpful.  Now she will adjust her Lantus  to 30 units BID.

## 2024-02-28 NOTE — Assessment & Plan Note (Signed)
 Can you hydration, midodrine and wearing compression stockings.  In fact she does have them on today which is fantastic.

## 2024-02-28 NOTE — Assessment & Plan Note (Addendum)
 Serum creatinine drawn 2 weeks ago is stable at her baseline.  Lab Results  Component Value Date   CREATININE 1.14 (H) 02/12/2024

## 2024-02-28 NOTE — Patient Instructions (Signed)
 Due to recheck TSH in 4 weeks.

## 2024-02-28 NOTE — Progress Notes (Addendum)
 Established Patient Office Visit  Subjective  Patient ID: [name], female    DOB: 13-Sep-1945  Age: 78 y.o. MRN: [id number]  Chief Complaint  Patient presents with   Diabetes    HPI  Diabetes - no hypoglycemic events. No wounds or sores that are not healing well. No increased thirst or urination. Checking glucose at home. Taking medications as prescribed without any side effects.  She says her blood sugars are still up and down.  Still having some occasional lows.  She still been giving 45 units of Lantus  at night but we had actually recently tried to switch it to 30 twice a day to see if we could maybe which little bit better steady-state she still continues to take glipizide  and as we have discussed today and previously it can also contribute to hypoglycemia as well actually think she be a great candidate for GLP-1 we just need to monitor for over suppression of the appetite and/or nausea.  But I think it could really make a difference in getting rid of the glipizide  and possibly even reducing her insulin  significantly.  Because of her very limited visual acuity I think she would do great with the Mounjaro  device.  We she could hear the clicks and self administer.  She did see one of my partners on July 8 for fatigue and lower extremity weakness.  In fact she had fallen 3 times within 1 week.  Blood pressure was a little on the low side that day so she was encouraged to hydrate and take her midodrine.  She did have a chest x-ray performed that day which was negative.  She still really struggling with her pain she says that is the 1 thing that makes it really difficult on a daily basis.  She was recently seen by pain management and started on a Butrans patch she has been on it for about 4 weeks.  They have scheduled her to have some injections done with Dr. Rosella.  And then she will follow back up with them in regards to the Butrans patch.  She did have some therapy done yesterday that was  primarily massage on her back and shoulders and then laid on the heating pad.  She says it really felt good and it gave her a lot of relief.  But by the time she sat back up she was starting to have pain again.  They are coming once a week for that.  She also wanted to know if she needed a measles vaccine.  ROS    Objective:     BP 108/66   Pulse 79   SpO2 100%    Physical Exam Vitals and nursing note reviewed.  Constitutional:      Appearance: Normal appearance.  HENT:     Head: Normocephalic and atraumatic.  Eyes:     Conjunctiva/sclera: Conjunctivae normal.  Cardiovascular:     Rate and Rhythm: Normal rate and regular rhythm.  Pulmonary:     Effort: Pulmonary effort is normal.     Breath sounds: Normal breath sounds.  Skin:    General: Skin is warm and dry.  Neurological:     Mental Status: She is alert.  Psychiatric:        Mood and Affect: Mood normal.      No results found for any visits on 02/28/24.    The ASCVD Risk score (Arnett DK, et al., 2019) failed to calculate for the following reasons:   Risk score  cannot be calculated because patient has a medical history suggesting prior/existing ASCVD    Assessment & Plan:   Problem List Items Addressed This Visit       Cardiovascular and Mediastinum   Orthostatic hypotension   Can you hydration, midodrine and wearing compression stockings.  In fact she does have them on today which is fantastic.        Endocrine   Hypothyroidism   She was missing her extra half dose on Sundays for her thyroid  medication so recently just bumped her 1-1 25 every day.  Due to recheck TSH in about 4 weeks we did go ahead and put the lab slip and encouraged to return in 4 weeks to have that drawn.      Relevant Orders   TSH   Diabetes mellitus with neuropathy (HCC) - Primary   A1C looks ok  at 7.2 discussed starting GLP-1 and work to get her down on her insulin .  I think this would help with ease of use.  And being that  she is legally blind it makes it also difficult for her to administer insulin  herself and be able to count and hear the clinics.  I think a device like the Mounjaro  would be helpful.  Now she will adjust her Lantus  to 30 units BID.       Relevant Medications   tirzepatide  (MOUNJARO ) 2.5 MG/0.5ML Pen   tirzepatide  (MOUNJARO ) 5 MG/0.5ML Pen (Start on 03/27/2024)   Other Relevant Orders   AMB Referral VBCI Care Management     Genitourinary   Chronic kidney disease (CKD) stage G3b/A2, moderately decreased glomerular filtration rate (GFR) between 30-44 mL/min/1.73 square meter and albuminuria creatinine ratio between 30-299 mg/g (HCC)   Serum creatinine drawn 2 weeks ago is stable at her baseline.  Lab Results  Component Value Date   CREATININE 1.14 (H) 02/12/2024          We did discuss that since she had measles as a child twice she should have natural immunity and does not need an updated vaccine.  Return in about 3 months (around 05/30/2024) for Diabetes follow-up.    Dorothyann Byars, MD

## 2024-02-28 NOTE — Assessment & Plan Note (Signed)
 She was missing her extra half dose on Sundays for her thyroid  medication so recently just bumped her 1-1 25 every day.  Due to recheck TSH in about 4 weeks we did go ahead and put the lab slip and encouraged to return in 4 weeks to have that drawn.

## 2024-02-28 NOTE — Progress Notes (Signed)
 Pt reports that she didn't realize that she was supposed to be taking 30 units BID so she just started doing that TODAY.

## 2024-03-02 DIAGNOSIS — G4733 Obstructive sleep apnea (adult) (pediatric): Secondary | ICD-10-CM | POA: Diagnosis not present

## 2024-03-03 ENCOUNTER — Other Ambulatory Visit: Payer: Self-pay | Admitting: Family Medicine

## 2024-03-04 DIAGNOSIS — E1142 Type 2 diabetes mellitus with diabetic polyneuropathy: Secondary | ICD-10-CM | POA: Diagnosis not present

## 2024-03-04 DIAGNOSIS — I130 Hypertensive heart and chronic kidney disease with heart failure and stage 1 through stage 4 chronic kidney disease, or unspecified chronic kidney disease: Secondary | ICD-10-CM | POA: Diagnosis not present

## 2024-03-04 DIAGNOSIS — I5032 Chronic diastolic (congestive) heart failure: Secondary | ICD-10-CM | POA: Diagnosis not present

## 2024-03-04 DIAGNOSIS — G6181 Chronic inflammatory demyelinating polyneuritis: Secondary | ICD-10-CM | POA: Diagnosis not present

## 2024-03-04 DIAGNOSIS — E11610 Type 2 diabetes mellitus with diabetic neuropathic arthropathy: Secondary | ICD-10-CM | POA: Diagnosis not present

## 2024-03-04 DIAGNOSIS — J441 Chronic obstructive pulmonary disease with (acute) exacerbation: Secondary | ICD-10-CM | POA: Diagnosis not present

## 2024-03-10 ENCOUNTER — Telehealth: Payer: Self-pay | Admitting: *Deleted

## 2024-03-10 DIAGNOSIS — G4733 Obstructive sleep apnea (adult) (pediatric): Secondary | ICD-10-CM | POA: Diagnosis not present

## 2024-03-10 DIAGNOSIS — J449 Chronic obstructive pulmonary disease, unspecified: Secondary | ICD-10-CM | POA: Diagnosis not present

## 2024-03-10 DIAGNOSIS — R9389 Abnormal findings on diagnostic imaging of other specified body structures: Secondary | ICD-10-CM | POA: Diagnosis not present

## 2024-03-10 DIAGNOSIS — J441 Chronic obstructive pulmonary disease with (acute) exacerbation: Secondary | ICD-10-CM | POA: Diagnosis not present

## 2024-03-10 DIAGNOSIS — E1142 Type 2 diabetes mellitus with diabetic polyneuropathy: Secondary | ICD-10-CM | POA: Diagnosis not present

## 2024-03-10 DIAGNOSIS — R911 Solitary pulmonary nodule: Secondary | ICD-10-CM | POA: Diagnosis not present

## 2024-03-10 DIAGNOSIS — G4719 Other hypersomnia: Secondary | ICD-10-CM | POA: Diagnosis not present

## 2024-03-10 DIAGNOSIS — I5032 Chronic diastolic (congestive) heart failure: Secondary | ICD-10-CM | POA: Diagnosis not present

## 2024-03-10 DIAGNOSIS — G4734 Idiopathic sleep related nonobstructive alveolar hypoventilation: Secondary | ICD-10-CM | POA: Diagnosis not present

## 2024-03-10 DIAGNOSIS — I130 Hypertensive heart and chronic kidney disease with heart failure and stage 1 through stage 4 chronic kidney disease, or unspecified chronic kidney disease: Secondary | ICD-10-CM | POA: Diagnosis not present

## 2024-03-10 DIAGNOSIS — G6181 Chronic inflammatory demyelinating polyneuritis: Secondary | ICD-10-CM | POA: Diagnosis not present

## 2024-03-10 DIAGNOSIS — E11610 Type 2 diabetes mellitus with diabetic neuropathic arthropathy: Secondary | ICD-10-CM | POA: Diagnosis not present

## 2024-03-10 NOTE — Progress Notes (Unsigned)
 Care Guide Pharmacy Note  03/10/2024 Name: [name] MRN: [id number] DOB: 1946/01/01  Referred By: Alvan Dorothyann BIRCH, MD Reason for referral: Call Attempt #1 and Complex Care Management (Outreach to schedule referral with pharmacist )   [name] is a 78 y.o. year old female who is a primary care patient of Metheney, Dorothyann BIRCH, MD.  [name] was referred to the pharmacist for assistance related to: DMII  An unsuccessful telephone outreach was attempted today to contact the patient who was referred to the pharmacy team for assistance with medication assistance. Additional attempts will be made to contact the patient.  [name], CMA, Care Guid [facility]  [facility], [facility] Guide Direct Dial: [phone]  Fax: [phone] Website: [email or web address]

## 2024-03-12 NOTE — Progress Notes (Unsigned)
 Care Guide Pharmacy Note  03/12/2024 Name: [name] MRN: [id number] DOB: 1945-09-09  Referred By: Alvan Dorothyann BIRCH, MD Reason for referral: Call Attempt #1 and Complex Care Management (Outreach to schedule referral with pharmacist )   [name] is a 79 y.o. year old female who is a primary care patient of Metheney, Dorothyann BIRCH, MD.  [name] was referred to the pharmacist for assistance related to: DMII  A second unsuccessful telephone outreach was attempted today to contact the patient who was referred to the pharmacy team for assistance with medication assistance. Additional attempts will be made to contact the patient.  [name], CMA [place]  [facility], [facility] Guide Direct Dial: [phone]  Fax: [phone] Website: [email or web address]

## 2024-03-13 ENCOUNTER — Other Ambulatory Visit: Payer: Self-pay | Admitting: Family Medicine

## 2024-03-13 NOTE — Progress Notes (Signed)
 Care Guide Pharmacy Note  03/13/2024 Name: [name] MRN: [id number] DOB: [birth year]  Referred By: Alvan Dorothyann BIRCH, MD Reason for referral: Call Attempt #1 and Complex Care Management (Outreach to schedule referral with pharmacist )   [name] is a 78 y.o. year old female who is a primary care patient of Metheney, Dorothyann BIRCH, MD.  [name] was referred to the pharmacist for assistance related to: DMII  A third unsuccessful telephone outreach was attempted today to contact the patient who was referred to the pharmacy team for assistance with medication management. The Population Health team is pleased to engage with this patient at any time in the future upon receipt of referral and should he/she be interested in assistance from the [facility] team.  Thedford Franks, CMA, Care Guide [facility]  [facility], [facility] Guide Direct Dial: [phone]  Fax: [phone] Website: [email or web address]

## 2024-03-17 DIAGNOSIS — J441 Chronic obstructive pulmonary disease with (acute) exacerbation: Secondary | ICD-10-CM | POA: Diagnosis not present

## 2024-03-17 DIAGNOSIS — I5032 Chronic diastolic (congestive) heart failure: Secondary | ICD-10-CM | POA: Diagnosis not present

## 2024-03-17 DIAGNOSIS — E1142 Type 2 diabetes mellitus with diabetic polyneuropathy: Secondary | ICD-10-CM | POA: Diagnosis not present

## 2024-03-17 DIAGNOSIS — I130 Hypertensive heart and chronic kidney disease with heart failure and stage 1 through stage 4 chronic kidney disease, or unspecified chronic kidney disease: Secondary | ICD-10-CM | POA: Diagnosis not present

## 2024-03-17 DIAGNOSIS — E11610 Type 2 diabetes mellitus with diabetic neuropathic arthropathy: Secondary | ICD-10-CM | POA: Diagnosis not present

## 2024-03-17 DIAGNOSIS — G6181 Chronic inflammatory demyelinating polyneuritis: Secondary | ICD-10-CM | POA: Diagnosis not present

## 2024-03-20 DIAGNOSIS — E1142 Type 2 diabetes mellitus with diabetic polyneuropathy: Secondary | ICD-10-CM | POA: Diagnosis not present

## 2024-03-20 DIAGNOSIS — H3530 Unspecified macular degeneration: Secondary | ICD-10-CM | POA: Diagnosis not present

## 2024-03-20 DIAGNOSIS — G6181 Chronic inflammatory demyelinating polyneuritis: Secondary | ICD-10-CM | POA: Diagnosis not present

## 2024-03-20 DIAGNOSIS — J441 Chronic obstructive pulmonary disease with (acute) exacerbation: Secondary | ICD-10-CM | POA: Diagnosis not present

## 2024-03-20 DIAGNOSIS — E11610 Type 2 diabetes mellitus with diabetic neuropathic arthropathy: Secondary | ICD-10-CM | POA: Diagnosis not present

## 2024-03-20 DIAGNOSIS — E785 Hyperlipidemia, unspecified: Secondary | ICD-10-CM | POA: Diagnosis not present

## 2024-03-20 DIAGNOSIS — I2510 Atherosclerotic heart disease of native coronary artery without angina pectoris: Secondary | ICD-10-CM | POA: Diagnosis not present

## 2024-03-20 DIAGNOSIS — E538 Deficiency of other specified B group vitamins: Secondary | ICD-10-CM | POA: Diagnosis not present

## 2024-03-20 DIAGNOSIS — G4733 Obstructive sleep apnea (adult) (pediatric): Secondary | ICD-10-CM | POA: Diagnosis not present

## 2024-03-20 DIAGNOSIS — F5101 Primary insomnia: Secondary | ICD-10-CM | POA: Diagnosis not present

## 2024-03-20 DIAGNOSIS — G894 Chronic pain syndrome: Secondary | ICD-10-CM | POA: Diagnosis not present

## 2024-03-20 DIAGNOSIS — I5032 Chronic diastolic (congestive) heart failure: Secondary | ICD-10-CM | POA: Diagnosis not present

## 2024-03-20 DIAGNOSIS — Z6838 Body mass index (BMI) 38.0-38.9, adult: Secondary | ICD-10-CM | POA: Diagnosis not present

## 2024-03-20 DIAGNOSIS — K589 Irritable bowel syndrome without diarrhea: Secondary | ICD-10-CM | POA: Diagnosis not present

## 2024-03-20 DIAGNOSIS — Z87891 Personal history of nicotine dependence: Secondary | ICD-10-CM | POA: Diagnosis not present

## 2024-03-20 DIAGNOSIS — E66812 Obesity, class 2: Secondary | ICD-10-CM | POA: Diagnosis not present

## 2024-03-20 DIAGNOSIS — Z7982 Long term (current) use of aspirin: Secondary | ICD-10-CM | POA: Diagnosis not present

## 2024-03-20 DIAGNOSIS — I130 Hypertensive heart and chronic kidney disease with heart failure and stage 1 through stage 4 chronic kidney disease, or unspecified chronic kidney disease: Secondary | ICD-10-CM | POA: Diagnosis not present

## 2024-03-20 DIAGNOSIS — E039 Hypothyroidism, unspecified: Secondary | ICD-10-CM | POA: Diagnosis not present

## 2024-03-20 DIAGNOSIS — Z794 Long term (current) use of insulin: Secondary | ICD-10-CM | POA: Diagnosis not present

## 2024-03-20 DIAGNOSIS — Z9181 History of falling: Secondary | ICD-10-CM | POA: Diagnosis not present

## 2024-03-20 DIAGNOSIS — F329 Major depressive disorder, single episode, unspecified: Secondary | ICD-10-CM | POA: Diagnosis not present

## 2024-03-20 DIAGNOSIS — M810 Age-related osteoporosis without current pathological fracture: Secondary | ICD-10-CM | POA: Diagnosis not present

## 2024-03-20 DIAGNOSIS — F419 Anxiety disorder, unspecified: Secondary | ICD-10-CM | POA: Diagnosis not present

## 2024-03-20 DIAGNOSIS — Z556 Problems related to health literacy: Secondary | ICD-10-CM | POA: Diagnosis not present

## 2024-03-26 ENCOUNTER — Other Ambulatory Visit: Payer: Self-pay | Admitting: Family Medicine

## 2024-03-26 DIAGNOSIS — E1140 Type 2 diabetes mellitus with diabetic neuropathy, unspecified: Secondary | ICD-10-CM

## 2024-03-26 DIAGNOSIS — I5032 Chronic diastolic (congestive) heart failure: Secondary | ICD-10-CM | POA: Diagnosis not present

## 2024-03-26 DIAGNOSIS — E11610 Type 2 diabetes mellitus with diabetic neuropathic arthropathy: Secondary | ICD-10-CM | POA: Diagnosis not present

## 2024-03-26 DIAGNOSIS — J441 Chronic obstructive pulmonary disease with (acute) exacerbation: Secondary | ICD-10-CM | POA: Diagnosis not present

## 2024-03-26 DIAGNOSIS — E1142 Type 2 diabetes mellitus with diabetic polyneuropathy: Secondary | ICD-10-CM | POA: Diagnosis not present

## 2024-03-26 DIAGNOSIS — G6181 Chronic inflammatory demyelinating polyneuritis: Secondary | ICD-10-CM | POA: Diagnosis not present

## 2024-03-26 DIAGNOSIS — I130 Hypertensive heart and chronic kidney disease with heart failure and stage 1 through stage 4 chronic kidney disease, or unspecified chronic kidney disease: Secondary | ICD-10-CM | POA: Diagnosis not present

## 2024-03-29 ENCOUNTER — Encounter: Payer: Self-pay | Admitting: Family Medicine

## 2024-03-29 ENCOUNTER — Other Ambulatory Visit: Payer: Self-pay | Admitting: Family Medicine

## 2024-03-29 DIAGNOSIS — E1140 Type 2 diabetes mellitus with diabetic neuropathy, unspecified: Secondary | ICD-10-CM

## 2024-03-31 ENCOUNTER — Other Ambulatory Visit: Payer: Self-pay | Admitting: *Deleted

## 2024-03-31 MED ORDER — POTASSIUM CHLORIDE CRYS ER 20 MEQ PO TBCR: 20.0000 meq | EXTENDED_RELEASE_TABLET | Freq: Every day | ORAL | 2 refills | Status: DC

## 2024-03-31 MED ORDER — VALACYCLOVIR HCL 500 MG PO TABS: 1000.0000 mg | ORAL_TABLET | Freq: Two times a day (BID) | ORAL | 0 refills | Status: DC

## 2024-03-31 MED ORDER — FUROSEMIDE 40 MG PO TABS: 40.0000 mg | ORAL_TABLET | Freq: Every day | ORAL | 1 refills | Status: AC

## 2024-03-31 MED ORDER — FUROSEMIDE 40 MG PO TABS: 40.0000 mg | ORAL_TABLET | Freq: Every day | ORAL | 0 refills | Status: DC

## 2024-03-31 NOTE — Telephone Encounter (Signed)
 Meds ordered this encounter  Medications   DISCONTD: furosemide  (LASIX ) 40 MG tablet    Sig: Take 1 tablet (40 mg total) by mouth daily.    Dispense:  90 tablet    Refill:  0   valACYclovir  (VALTREX ) 500 MG tablet    Sig: Take 2 tablets (1,000 mg total) by mouth 2 (two) times daily.    Dispense:  120 tablet    Refill:  0   furosemide  (LASIX ) 40 MG tablet    Sig: Take 1-2 tablets (40-80 mg total) by mouth daily.    Dispense:  180 tablet    Refill:  1   Potassium aslo sent

## 2024-03-31 NOTE — Addendum Note (Signed)
 Addended by: Dain Laseter D on: 03/31/2024 08:57 PM   Modules accepted: Orders

## 2024-04-01 ENCOUNTER — Telehealth: Payer: Self-pay

## 2024-04-01 ENCOUNTER — Other Ambulatory Visit: Payer: Self-pay | Admitting: Family Medicine

## 2024-04-01 DIAGNOSIS — M533 Sacrococcygeal disorders, not elsewhere classified: Secondary | ICD-10-CM | POA: Diagnosis not present

## 2024-04-01 NOTE — Telephone Encounter (Signed)
 Patient daughter informed of reason for call by [employer] representative Corin V

## 2024-04-01 NOTE — Telephone Encounter (Signed)
 Copied from CRM #[id number]. Topic: General - Other >> Apr 01, 2024 11:04 AM Corin V wrote: Reason for CRM: Patient daughter returning call from [place]. Advised 3 prescriptions have been sent to pharmacy.

## 2024-04-01 NOTE — Telephone Encounter (Signed)
 Patient informed that prescriptions have been sent to pharmacy . Also left a detailed voice mail message on daughters phone requesting a call back so we could inform her of the same.

## 2024-04-02 ENCOUNTER — Other Ambulatory Visit (HOSPITAL_COMMUNITY): Payer: Self-pay

## 2024-04-02 ENCOUNTER — Telehealth: Payer: Self-pay

## 2024-04-02 NOTE — Telephone Encounter (Signed)
 Pharmacy Patient Advocate Encounter   Received notification from Onbase that prior authorization for valACYclovir  HCl 500MG  tablets  is required/requested.   Insurance verification completed.   The patient is insured through [employer] .   Per test claim: PA required; PA submitted to above mentioned insurance via Latent Key/confirmation #/EOC [id number] Status is pending

## 2024-04-02 NOTE — Telephone Encounter (Signed)
 Received a prior authorization request for the Valacyclovir  500mg  tabs and per the test claim, maximum daily dose of 2 tabs.   Can the order be changed to the Valacyclovir  1gm tabs to take 1 tablet by mouth twice daily?   No prior shara is required for the 1gm tabs and the copay would be $0 and would result in no delay in the patient getting the medication.   Please advise

## 2024-04-03 ENCOUNTER — Telehealth: Payer: Self-pay

## 2024-04-03 DIAGNOSIS — I130 Hypertensive heart and chronic kidney disease with heart failure and stage 1 through stage 4 chronic kidney disease, or unspecified chronic kidney disease: Secondary | ICD-10-CM | POA: Diagnosis not present

## 2024-04-03 DIAGNOSIS — J441 Chronic obstructive pulmonary disease with (acute) exacerbation: Secondary | ICD-10-CM | POA: Diagnosis not present

## 2024-04-03 DIAGNOSIS — E11610 Type 2 diabetes mellitus with diabetic neuropathic arthropathy: Secondary | ICD-10-CM | POA: Diagnosis not present

## 2024-04-03 DIAGNOSIS — I5032 Chronic diastolic (congestive) heart failure: Secondary | ICD-10-CM | POA: Diagnosis not present

## 2024-04-03 DIAGNOSIS — G6181 Chronic inflammatory demyelinating polyneuritis: Secondary | ICD-10-CM | POA: Diagnosis not present

## 2024-04-03 DIAGNOSIS — E1142 Type 2 diabetes mellitus with diabetic polyneuropathy: Secondary | ICD-10-CM | POA: Diagnosis not present

## 2024-04-03 NOTE — Telephone Encounter (Signed)
 Call pt: why is she still taking the valacyclovir 

## 2024-04-03 NOTE — Telephone Encounter (Signed)
 Note from prior authorization team  Received a prior authorization request for the Valacyclovir  500mg  tabs and per the test claim, maximum daily dose of 2 tabs.    Can the order be changed to the Valacyclovir  1gm tabs to take 1 tablet by mouth twice daily?    No prior shara is required for the 1gm tabs and the copay would be $0 and would result in no delay in the patient getting the medication.        Deadline with [facility] for PA is 04/05/2024

## 2024-04-03 NOTE — Telephone Encounter (Signed)
 Copied from CRM [id number]. Topic: Clinical - Medication Prior Auth >> Apr 03, 2024  9:28 AM Laurier BROCKS wrote: Reason for CRM:   Who: Vina from [employer] [phone] Reason for the call: [facility] Is needing a prior auth for the following medication valACYclovir  (VALTREX ) 500 MG tablet  Reference # [id number] Deadline for reply 8/30 4:20 am central time  Form was faxed to the office by [facility] on 04/02/24

## 2024-04-04 MED ORDER — VALACYCLOVIR HCL 500 MG PO TABS: 500.0000 mg | ORAL_TABLET | Freq: Two times a day (BID) | ORAL | 1 refills | Status: AC

## 2024-04-04 NOTE — Telephone Encounter (Signed)
 Okay, that makes sense.  Then we probably need to decrease her dose she is on a pretty high dose that we normally use for actual treatment when there is a breakout so we should be able to drop her down to 500 mg twice a day for preventative and if she does not have any breakouts for 3 or 4 months we can even try dropping that down to 1 a day.

## 2024-04-04 NOTE — Telephone Encounter (Signed)
 Patient daughter Baltazar informed of change of dosing and recommendations for future dosing if remains outbreak free for 3 to 4 months.

## 2024-04-04 NOTE — Telephone Encounter (Signed)
 Spoke with patient's daughter - [name].  States while patient was in rehab patient was told to take this  daily ,preventatively to prevent HSV outbreaks.

## 2024-04-04 NOTE — Telephone Encounter (Signed)
 Please review TE from CPhT Natalie regarding the valacyclovir  rx. Thanks in advance.

## 2024-04-08 ENCOUNTER — Other Ambulatory Visit: Payer: Self-pay | Admitting: Family Medicine

## 2024-04-08 ENCOUNTER — Encounter: Payer: Self-pay | Admitting: Sports Medicine

## 2024-04-09 ENCOUNTER — Encounter: Payer: Self-pay | Admitting: Family Medicine

## 2024-04-10 DIAGNOSIS — I5032 Chronic diastolic (congestive) heart failure: Secondary | ICD-10-CM | POA: Diagnosis not present

## 2024-04-10 DIAGNOSIS — G6181 Chronic inflammatory demyelinating polyneuritis: Secondary | ICD-10-CM | POA: Diagnosis not present

## 2024-04-10 DIAGNOSIS — E11610 Type 2 diabetes mellitus with diabetic neuropathic arthropathy: Secondary | ICD-10-CM | POA: Diagnosis not present

## 2024-04-10 DIAGNOSIS — I130 Hypertensive heart and chronic kidney disease with heart failure and stage 1 through stage 4 chronic kidney disease, or unspecified chronic kidney disease: Secondary | ICD-10-CM | POA: Diagnosis not present

## 2024-04-10 DIAGNOSIS — J441 Chronic obstructive pulmonary disease with (acute) exacerbation: Secondary | ICD-10-CM | POA: Diagnosis not present

## 2024-04-10 DIAGNOSIS — E1142 Type 2 diabetes mellitus with diabetic polyneuropathy: Secondary | ICD-10-CM | POA: Diagnosis not present

## 2024-04-15 DIAGNOSIS — I5032 Chronic diastolic (congestive) heart failure: Secondary | ICD-10-CM | POA: Diagnosis not present

## 2024-04-15 DIAGNOSIS — I130 Hypertensive heart and chronic kidney disease with heart failure and stage 1 through stage 4 chronic kidney disease, or unspecified chronic kidney disease: Secondary | ICD-10-CM | POA: Diagnosis not present

## 2024-04-15 DIAGNOSIS — E11610 Type 2 diabetes mellitus with diabetic neuropathic arthropathy: Secondary | ICD-10-CM | POA: Diagnosis not present

## 2024-04-15 DIAGNOSIS — J441 Chronic obstructive pulmonary disease with (acute) exacerbation: Secondary | ICD-10-CM | POA: Diagnosis not present

## 2024-04-15 DIAGNOSIS — G6181 Chronic inflammatory demyelinating polyneuritis: Secondary | ICD-10-CM | POA: Diagnosis not present

## 2024-04-15 DIAGNOSIS — E1142 Type 2 diabetes mellitus with diabetic polyneuropathy: Secondary | ICD-10-CM | POA: Diagnosis not present

## 2024-04-17 ENCOUNTER — Other Ambulatory Visit: Payer: Self-pay | Admitting: Family Medicine

## 2024-04-18 ENCOUNTER — Encounter: Payer: Self-pay | Admitting: Family Medicine

## 2024-04-18 MED ORDER — PANTOPRAZOLE SODIUM 40 MG PO TBEC: 40.0000 mg | DELAYED_RELEASE_TABLET | Freq: Every day | ORAL | 0 refills | Status: DC

## 2024-04-21 ENCOUNTER — Other Ambulatory Visit: Payer: Self-pay | Admitting: Family Medicine

## 2024-04-21 DIAGNOSIS — R42 Dizziness and giddiness: Secondary | ICD-10-CM

## 2024-04-21 DIAGNOSIS — J449 Chronic obstructive pulmonary disease, unspecified: Secondary | ICD-10-CM

## 2024-04-24 ENCOUNTER — Other Ambulatory Visit: Payer: Self-pay | Admitting: Family Medicine

## 2024-04-24 ENCOUNTER — Encounter: Payer: Self-pay | Admitting: Family Medicine

## 2024-04-24 DIAGNOSIS — E1140 Type 2 diabetes mellitus with diabetic neuropathy, unspecified: Secondary | ICD-10-CM

## 2024-04-24 NOTE — Telephone Encounter (Signed)
 See if ok going up on dose?

## 2024-04-24 NOTE — Telephone Encounter (Signed)
 Requesting rx rf of Mounjaro  5mg   Last written 03/27/2024 Last OV 02/12/2024 Upcoming appt 05/29/2024

## 2024-04-25 MED ORDER — TIRZEPATIDE 7.5 MG/0.5ML ~~LOC~~ SOAJ: 7.5000 mg | SUBCUTANEOUS | 1 refills | Status: DC

## 2024-04-25 NOTE — Telephone Encounter (Signed)
 Pt is okay increasing her dosage. Sent in today [facility]  [place]

## 2024-05-03 ENCOUNTER — Other Ambulatory Visit: Payer: Self-pay | Admitting: Urgent Care

## 2024-05-09 ENCOUNTER — Other Ambulatory Visit: Payer: Self-pay | Admitting: Family Medicine

## 2024-05-09 ENCOUNTER — Ambulatory Visit: Admitting: Podiatry

## 2024-05-09 DIAGNOSIS — J4489 Other specified chronic obstructive pulmonary disease: Secondary | ICD-10-CM

## 2024-05-13 ENCOUNTER — Other Ambulatory Visit: Payer: Self-pay | Admitting: Family Medicine

## 2024-05-22 ENCOUNTER — Other Ambulatory Visit: Payer: Self-pay | Admitting: Family Medicine

## 2024-05-22 ENCOUNTER — Encounter: Payer: Self-pay | Admitting: Family Medicine

## 2024-05-22 DIAGNOSIS — E1140 Type 2 diabetes mellitus with diabetic neuropathy, unspecified: Secondary | ICD-10-CM

## 2024-05-22 NOTE — Telephone Encounter (Signed)
 Requesting rx rf of mounjaro  with strength increase to 10mg   and valtrex   Mounjaro  last written as 7.5mg  on 04/25/2024 And  Valtrex  500mg  last written 04/04/2024 ( Should still have one refill at pharmacy  ) [facility] pharmacy  - they did have the prescription refill showing in patient chart from 04/04/2024 Last OV 02/12/2024 Upcoming appt 05/29/2024 Pended script for new strength of mounjaro   Patient daughter informed that Valtrex  already  in process of being filled at pharmacy 02/28/2024

## 2024-05-23 MED ORDER — TIRZEPATIDE 10 MG/0.5ML ~~LOC~~ SOAJ: 10.0000 mg | SUBCUTANEOUS | 0 refills | Status: DC

## 2024-05-29 ENCOUNTER — Ambulatory Visit: Admitting: Family Medicine

## 2024-06-03 ENCOUNTER — Other Ambulatory Visit: Payer: Self-pay | Admitting: Family Medicine

## 2024-06-03 DIAGNOSIS — H60543 Acute eczematoid otitis externa, bilateral: Secondary | ICD-10-CM

## 2024-06-11 ENCOUNTER — Other Ambulatory Visit: Payer: Self-pay | Admitting: Family Medicine

## 2024-06-12 ENCOUNTER — Encounter: Payer: Self-pay | Admitting: Podiatry

## 2024-06-12 ENCOUNTER — Ambulatory Visit (INDEPENDENT_AMBULATORY_CARE_PROVIDER_SITE_OTHER): Admitting: Podiatry

## 2024-06-12 DIAGNOSIS — E1142 Type 2 diabetes mellitus with diabetic polyneuropathy: Secondary | ICD-10-CM

## 2024-06-12 DIAGNOSIS — M79675 Pain in left toe(s): Secondary | ICD-10-CM

## 2024-06-12 DIAGNOSIS — M79674 Pain in right toe(s): Secondary | ICD-10-CM

## 2024-06-12 DIAGNOSIS — B351 Tinea unguium: Secondary | ICD-10-CM

## 2024-06-12 NOTE — Progress Notes (Signed)
  Subjective:  Patient ID: [name], female    DOB: [birth year],   MRN: [id number]  Chief Complaint  Patient presents with   Diabetes    Toenails  Saw Dr. Dorothyann Byars - 04/15/2024; A1c - 7.3       78 y.o. female presents for concern of thickened elongated and painful nails that are difficult to trim. Requesting to have them trimmed today. Denies any burning or tingling in her feet. Patient is diabetic and last A1c was 7.3  . Denies any other pedal complaints. Denies n/v/f/c.   PCP: Dorothyann Byars MD   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 CFT <3 seconds. Absent hair growth on digits. Edema noted to bilateral lower extremities. Xerosis noted bilaterally.  Skin. No lacerations or abrasions bilateral feet. Nails 1-5 bilateral  are thickened discolored and elongated with subungual debris. Maceration noted in 3rd and fourth interspaced bilateral.  Musculoskeletal: MMT 5/5 bilateral lower extremities in DF, PF, Inversion and Eversion. Deceased ROM in DF of ankle joint.  Neurological:  Sensation intact to light touch. Protective sensation diminished bilateral.    Assessment:   1. Pain due to onychomycosis of toenails of both feet   2. Type 2 diabetes mellitus with diabetic polyneuropathy, unspecified whether long term insulin  use (HCC)        Plan:  Patient was evaluated and treated and all questions answered. -Discussed and educated patient on diabetic foot care, especially with  regards to the vascular, neurological and musculoskeletal systems.  -Stressed the importance of good glycemic control and the detriment of not  controlling glucose levels in relation to the foot. -Discussed supportive shoes at all times and checking feet regularly.  -Mechanically debrided all nails 1-5 bilateral using sterile nail nipper and filed with dremel without incident  -Answered all patient questions -Patient to return  in 3 months for at risk foot care -Patient advised to call the office if any problems or questions arise in the meantime.    Asberry Failing, DPM

## 2024-06-15 ENCOUNTER — Encounter: Payer: Self-pay | Admitting: Family Medicine

## 2024-06-15 DIAGNOSIS — J449 Chronic obstructive pulmonary disease, unspecified: Secondary | ICD-10-CM

## 2024-06-16 ENCOUNTER — Other Ambulatory Visit: Payer: Self-pay | Admitting: Family Medicine

## 2024-06-16 MED ORDER — TRELEGY ELLIPTA 100-62.5-25 MCG/ACT IN AEPB
1.0000 | INHALATION_SPRAY | Freq: Every day | RESPIRATORY_TRACT | 0 refills | Status: AC
Start: 2024-06-16 — End: ?

## 2024-06-16 MED ORDER — GLIPIZIDE ER 2.5 MG PO TB24: 2.5000 mg | ORAL_TABLET | Freq: Every day | ORAL | 0 refills | Status: DC

## 2024-06-16 MED ORDER — POTASSIUM CHLORIDE CRYS ER 20 MEQ PO TBCR: 20.0000 meq | EXTENDED_RELEASE_TABLET | Freq: Every day | ORAL | 2 refills | Status: DC

## 2024-06-18 ENCOUNTER — Encounter: Payer: Self-pay | Admitting: Family Medicine

## 2024-06-18 MED ORDER — LEVOTHYROXINE SODIUM 125 MCG PO TABS: 125.0000 ug | ORAL_TABLET | Freq: Every day | ORAL | 0 refills | Status: DC

## 2024-06-26 NOTE — Telephone Encounter (Signed)
 Patient chart shows patient is scheduled for 07/14/24.

## 2024-06-27 ENCOUNTER — Other Ambulatory Visit: Payer: Self-pay | Admitting: Family Medicine

## 2024-06-27 DIAGNOSIS — R21 Rash and other nonspecific skin eruption: Secondary | ICD-10-CM

## 2024-06-27 DIAGNOSIS — B372 Candidiasis of skin and nail: Secondary | ICD-10-CM

## 2024-07-14 ENCOUNTER — Ambulatory Visit: Admitting: Family Medicine

## 2024-07-24 ENCOUNTER — Other Ambulatory Visit: Payer: Self-pay | Admitting: *Deleted

## 2024-07-24 ENCOUNTER — Encounter: Payer: Self-pay | Admitting: Family Medicine

## 2024-07-24 ENCOUNTER — Other Ambulatory Visit: Payer: Self-pay | Admitting: Family Medicine

## 2024-07-24 DIAGNOSIS — K591 Functional diarrhea: Secondary | ICD-10-CM

## 2024-07-24 DIAGNOSIS — J449 Chronic obstructive pulmonary disease, unspecified: Secondary | ICD-10-CM

## 2024-07-24 MED ORDER — DIPHENOXYLATE-ATROPINE 2.5-0.025 MG PO TABS: ORAL_TABLET | ORAL | 1 refills | Status: AC

## 2024-07-24 MED ORDER — ALBUTEROL SULFATE HFA 108 (90 BASE) MCG/ACT IN AERS: 2.0000 | INHALATION_SPRAY | Freq: Four times a day (QID) | RESPIRATORY_TRACT | 99 refills | Status: AC | PRN

## 2024-08-04 ENCOUNTER — Encounter: Payer: Self-pay | Admitting: Family Medicine

## 2024-08-04 ENCOUNTER — Other Ambulatory Visit: Payer: Self-pay | Admitting: Family Medicine

## 2024-08-04 DIAGNOSIS — R42 Dizziness and giddiness: Secondary | ICD-10-CM

## 2024-08-04 MED ORDER — NYSTATIN 100000 UNIT/ML MT SUSP: 5.0000 mL | Freq: Four times a day (QID) | OROMUCOSAL | 0 refills | Status: DC

## 2024-08-04 MED ORDER — MIDODRINE HCL 5 MG PO TABS: 5.0000 mg | ORAL_TABLET | Freq: Three times a day (TID) | ORAL | 3 refills | Status: AC

## 2024-08-04 NOTE — Telephone Encounter (Signed)
 Meds ordered this encounter  Medications   midodrine (PROAMATINE) 5 MG tablet    Sig: Take 1 tablet (5 mg total) by mouth 3 (three) times daily.    Dispense:  90 tablet    Refill:  3   nystatin  (MYCOSTATIN ) 100000 UNIT/ML suspension    Sig: Take 5 mLs (500,000 Units total) by mouth 4 (four) times daily. X 1 week. Swish and hold in mouth for at least 2 minutes and then swallow.    Dispense:  473 mL    Refill:  0    Meclizine  was already refilled.

## 2024-08-04 NOTE — Telephone Encounter (Signed)
 Requesting rx rf of  Midodrine Last written 07/02/2024 ( 30 day supply and one refill)  And  Meclizine  Last written= refill sent today  Last OV 02/12/2024 Upcoming appt 08/19/2024  Patient also requesting a medication for thrush  (Please see patient message attached)

## 2024-08-05 NOTE — Telephone Encounter (Signed)
 Last read by [name] at 12:35PM on 08/04/2024.

## 2024-08-10 ENCOUNTER — Other Ambulatory Visit: Payer: Self-pay | Admitting: Family Medicine

## 2024-08-16 ENCOUNTER — Other Ambulatory Visit: Payer: Self-pay | Admitting: Family Medicine

## 2024-08-18 ENCOUNTER — Encounter: Payer: Self-pay | Admitting: Family Medicine

## 2024-08-18 ENCOUNTER — Other Ambulatory Visit: Payer: Self-pay

## 2024-08-18 MED ORDER — TRAZODONE HCL 50 MG PO TABS: 50.0000 mg | ORAL_TABLET | Freq: Every day | ORAL | 0 refills | Status: DC

## 2024-08-18 MED ORDER — POTASSIUM CHLORIDE CRYS ER 20 MEQ PO TBCR: 20.0000 meq | EXTENDED_RELEASE_TABLET | Freq: Every day | ORAL | 2 refills | Status: AC

## 2024-08-19 ENCOUNTER — Encounter: Payer: Self-pay | Admitting: Family Medicine

## 2024-08-19 ENCOUNTER — Ambulatory Visit: Admitting: Family Medicine

## 2024-08-19 ENCOUNTER — Other Ambulatory Visit: Payer: Self-pay | Admitting: Family Medicine

## 2024-08-19 ENCOUNTER — Other Ambulatory Visit: Payer: Self-pay

## 2024-08-19 VITALS — BP 92/54 | HR 73 | Ht 67.0 in

## 2024-08-19 DIAGNOSIS — E1140 Type 2 diabetes mellitus with diabetic neuropathy, unspecified: Secondary | ICD-10-CM | POA: Diagnosis not present

## 2024-08-19 DIAGNOSIS — J4489 Other specified chronic obstructive pulmonary disease: Secondary | ICD-10-CM | POA: Diagnosis not present

## 2024-08-19 DIAGNOSIS — G6181 Chronic inflammatory demyelinating polyneuritis: Secondary | ICD-10-CM | POA: Diagnosis not present

## 2024-08-19 DIAGNOSIS — N3941 Urge incontinence: Secondary | ICD-10-CM | POA: Insufficient documentation

## 2024-08-19 DIAGNOSIS — E039 Hypothyroidism, unspecified: Secondary | ICD-10-CM | POA: Diagnosis not present

## 2024-08-19 DIAGNOSIS — E1142 Type 2 diabetes mellitus with diabetic polyneuropathy: Secondary | ICD-10-CM

## 2024-08-19 DIAGNOSIS — E1169 Type 2 diabetes mellitus with other specified complication: Secondary | ICD-10-CM

## 2024-08-19 DIAGNOSIS — J9611 Chronic respiratory failure with hypoxia: Secondary | ICD-10-CM | POA: Insufficient documentation

## 2024-08-19 DIAGNOSIS — F331 Major depressive disorder, recurrent, moderate: Secondary | ICD-10-CM | POA: Diagnosis not present

## 2024-08-19 DIAGNOSIS — H04123 Dry eye syndrome of bilateral lacrimal glands: Secondary | ICD-10-CM | POA: Diagnosis not present

## 2024-08-19 DIAGNOSIS — N1832 Chronic kidney disease, stage 3b: Secondary | ICD-10-CM

## 2024-08-19 DIAGNOSIS — I110 Hypertensive heart disease with heart failure: Secondary | ICD-10-CM | POA: Insufficient documentation

## 2024-08-19 LAB — POCT GLYCOSYLATED HEMOGLOBIN (HGB A1C): Hemoglobin A1C: 4.5 % (ref 4.0–5.6)

## 2024-08-19 MED ORDER — TRAZODONE HCL 100 MG PO TABS: 100.0000 mg | ORAL_TABLET | Freq: Every day | ORAL | 1 refills | Status: AC

## 2024-08-19 MED ORDER — SIMVASTATIN 80 MG PO TABS: 80.0000 mg | ORAL_TABLET | Freq: Every day | ORAL | 1 refills | Status: AC

## 2024-08-19 NOTE — Assessment & Plan Note (Signed)
"     Continue Trelegy       "

## 2024-08-19 NOTE — Patient Instructions (Signed)
 Please stop the glipizide  We will likely cut back on your insulin  once I see your hemoglobin.

## 2024-08-19 NOTE — Assessment & Plan Note (Signed)
 Monitor Q 6 mo. Due for UACR but unable to give urine today.

## 2024-08-19 NOTE — Progress Notes (Addendum)
 "  Established Patient Office Visit  Patient ID: [name], female    DOB: 01-22-1946  Age: 79 y.o. MRN: [id number] PCP: Alvan Dorothyann BIRCH, MD  Chief Complaint  Patient presents with   Diabetes    Subjective:     HPI  Discussed the use of AI scribe software for clinical note transcription with the patient, who gave verbal consent to proceed.  History of Present Illness [name] is a 79 year old female who presents with urinary incontinence and crusty eyes.  Urinary incontinence - Urge urinary incontinence with sudden, strong urge to urinate and inability to reach the bathroom in time - Gradual onset without recent triggering event - No recent urinary tract infections  Ocular discharge - Crusty eyes upon waking, with one eye more affected - Eyelids crusted shut in the morning, requiring rubbing to open - No redness or pain during the day  Glycemic control - Daily blood glucose readings typically 128-130 mg/dL - Uses insulin  and glipizide  - A1c reported as 4.5% - No hypoglycemic episodes, including at night  Sleep disturbance - Uses trazodone  for sleep, usually two tablets as one is insufficient - No longer uses melatonin - Current regimen effective for sleep  Musculoskeletal symptoms - Intermittent hand pain - Puffiness in various joints  Visual disturbance - Difficulty seeing clearly, including at close range  Cognitive changes - Experiences confusion upon waking from vivid dreams     ROS    Objective:     BP (!) 92/54   Pulse 73   Ht 5' 7 (1.702 m)   SpO2 96%   BMI 36.18 kg/m    Physical Exam Vitals and nursing note reviewed.  Constitutional:      Appearance: Normal appearance.  HENT:     Head: Normocephalic and atraumatic.  Eyes:     Conjunctiva/sclera: Conjunctivae normal.  Cardiovascular:     Rate and Rhythm: Normal rate and regular rhythm.  Pulmonary:     Effort: Pulmonary effort is normal.     Breath sounds: Normal  breath sounds.  Skin:    General: Skin is warm and dry.  Neurological:     Mental Status: She is alert.  Psychiatric:        Mood and Affect: Mood normal.      Results for orders placed or performed in visit on 08/19/24  POCT HgB A1C  Result Value Ref Range   Hemoglobin A1C 4.5 4.0 - 5.6 %   HbA1c POC (<> result, manual entry)     HbA1c, POC (prediabetic range)     HbA1c, POC (controlled diabetic range)        The ASCVD Risk score (Arnett DK, et al., 2019) failed to calculate for the following reasons:   Risk score cannot be calculated because patient has a medical history suggesting prior/existing ASCVD   * - Cholesterol units were assumed    Assessment & Plan:   Problem List Items Addressed This Visit       Cardiovascular and Mediastinum   RESOLVED: Hypertensive heart disease with heart failure (HCC)     Respiratory   COPD (chronic obstructive pulmonary disease) with chronic bronchitis (HCC)   Relevant Orders   TSH   CMP14+EGFR   CBC with Differential/Platelet   Chronic respiratory failure with hypoxia (HCC)   Continue Trelegy         Endocrine   Hypothyroidism   Due to recheck TSH      Relevant Orders   TSH  CMP14+EGFR   CBC with Differential/Platelet   Diabetic peripheral neuropathy (HCC)   Continue duloxetine       Relevant Medications   traZODone  (DESYREL ) 100 MG tablet   Diabetes mellitus with neuropathy (HCC) - Primary   Type 2 diabetes mellitus with diabetic neuropathy A1c well-controlled at 4.5. Potential nocturnal hypoglycemia due to low A1c. Glipizide  may cause weight gain. - Ordered CBC to check hemoglobin levels. - If CBC normal decrease insulin  -  discontinue glipizide . - Consider reducing insulin  dosage if A1c remains low.      Relevant Orders   POCT HgB A1C (Completed)   TSH   CMP14+EGFR   CBC with Differential/Platelet     Nervous and Auditory   CIDP (chronic inflammatory demyelinating polyneuropathy) (HCC)   Followed by  Neurology. Wearing bilat leg braces today.       Relevant Medications   traZODone  (DESYREL ) 100 MG tablet     Genitourinary   Chronic kidney disease (CKD) stage G3b/A2, moderately decreased glomerular filtration rate (GFR) between 30-44 mL/min/1.73 square meter and albuminuria creatinine ratio between 30-299 mg/g (HCC)   Monitor Q 6 mo. Due for UACR but unable to give urine today.         Other   Urge incontinence   Urge incontinence Gradual onset with leakage episodes. No infection. Discussed non-pharmacological management and medication side effects. - Recommended Kegel exercises and abdominal muscle tightening. - Discussed potential medication use if non-pharmacological measures ineffective.      Moderate episode of recurrent major depressive disorder (HCC)   Relevant Medications   traZODone  (DESYREL ) 100 MG tablet   Other Visit Diagnoses       Bilateral dry eyes           Assessment and Plan Assessment & Plan Sleep - change trazodone  to 100mg  daily since she typically takes 2 of the 50mg  to sleep.    Dry eye syndrome Morning crusting likely due to dry eye syndrome. No infection or pain. Discussed moisturizing eye drops and gel. - Recommended over-the-counter moisturizing eye gel for nighttime use. - Advised using a warm, wet washcloth to soften crust before cleaning.  Unable to give urine sample today.   Return in about 20 weeks (around 01/06/2025) for Diabetes follow-up.    Dorothyann Byars, MD [facility] at [facility]   "

## 2024-08-19 NOTE — Assessment & Plan Note (Signed)
 Continue duloxetine 

## 2024-08-19 NOTE — Assessment & Plan Note (Signed)
 Due to recheck TSH.

## 2024-08-19 NOTE — Assessment & Plan Note (Addendum)
 Type 2 diabetes mellitus with diabetic neuropathy A1c well-controlled at 4.5. Potential nocturnal hypoglycemia due to low A1c. Glipizide  may cause weight gain. - Ordered CBC to check hemoglobin levels. - If CBC normal decrease insulin  -  discontinue glipizide . - Consider reducing insulin  dosage if A1c remains low.

## 2024-08-19 NOTE — Assessment & Plan Note (Signed)
 Followed by Neurology. Wearing bilat leg braces today.

## 2024-08-19 NOTE — Assessment & Plan Note (Signed)
 Urge incontinence Gradual onset with leakage episodes. No infection. Discussed non-pharmacological management and medication side effects. - Recommended Kegel exercises and abdominal muscle tightening. - Discussed potential medication use if non-pharmacological measures ineffective.

## 2024-08-19 NOTE — Addendum Note (Signed)
 Addended by: Oleg Oleson D on: 08/19/2024 05:56 PM   Modules accepted: Orders

## 2024-08-20 LAB — CBC WITH DIFFERENTIAL/PLATELET
Basophils Absolute: 0.1 x10E3/uL (ref 0.0–0.2)
Basos: 1 %
EOS (ABSOLUTE): 0 x10E3/uL (ref 0.0–0.4)
Eos: 0 %
Hematocrit: 35.4 % (ref 34.0–46.6)
Hemoglobin: 11.8 g/dL (ref 11.1–15.9)
Immature Grans (Abs): 0 x10E3/uL (ref 0.0–0.1)
Immature Granulocytes: 0 %
Lymphocytes Absolute: 1 x10E3/uL (ref 0.7–3.1)
Lymphs: 19 %
MCH: 38.1 pg — ABNORMAL HIGH (ref 26.6–33.0)
MCHC: 33.3 g/dL (ref 31.5–35.7)
MCV: 114 fL — ABNORMAL HIGH (ref 79–97)
Monocytes Absolute: 0.5 x10E3/uL (ref 0.1–0.9)
Monocytes: 9 %
Neutrophils Absolute: 3.7 x10E3/uL (ref 1.4–7.0)
Neutrophils: 71 %
Platelets: 167 x10E3/uL (ref 150–450)
RBC: 3.1 x10E6/uL — ABNORMAL LOW (ref 3.77–5.28)
RDW: 12.9 % (ref 11.7–15.4)
WBC: 5.3 x10E3/uL (ref 3.4–10.8)

## 2024-08-20 LAB — CMP14+EGFR
ALT: 5 IU/L (ref 0–32)
AST: 12 IU/L (ref 0–40)
Albumin: 3.8 g/dL (ref 3.8–4.8)
Alkaline Phosphatase: 55 IU/L (ref 49–135)
BUN/Creatinine Ratio: 11 — ABNORMAL LOW (ref 12–28)
BUN: 14 mg/dL (ref 8–27)
Bilirubin Total: 1.3 mg/dL — ABNORMAL HIGH (ref 0.0–1.2)
CO2: 24 mmol/L (ref 20–29)
Calcium: 9.4 mg/dL (ref 8.7–10.3)
Chloride: 93 mmol/L — ABNORMAL LOW (ref 96–106)
Creatinine, Ser: 1.26 mg/dL — ABNORMAL HIGH (ref 0.57–1.00)
Globulin, Total: 4.2 g/dL (ref 1.5–4.5)
Glucose: 69 mg/dL — ABNORMAL LOW (ref 70–99)
Potassium: 3 mmol/L — ABNORMAL LOW (ref 3.5–5.2)
Sodium: 138 mmol/L (ref 134–144)
Total Protein: 8 g/dL (ref 6.0–8.5)
eGFR: 43 mL/min/1.73 — ABNORMAL LOW

## 2024-08-20 LAB — TSH: TSH: 5.13 u[IU]/mL — ABNORMAL HIGH (ref 0.450–4.500)

## 2024-08-22 ENCOUNTER — Ambulatory Visit: Payer: Self-pay | Admitting: Family Medicine

## 2024-08-22 DIAGNOSIS — E039 Hypothyroidism, unspecified: Secondary | ICD-10-CM

## 2024-08-22 NOTE — Progress Notes (Signed)
 Hi [name], TSH looks a little better it was 8 it is now down to 5.1 but still not where we want it to be.  If you are taking a whole tab daily then I would like for you to take an extra half a tab on Sundays.  So you would take a whole tab Monday through Saturday and then 1-1/2 tabs on Sunday and repeat this pattern each week  .  Then we would recheck your level in about 6 to 8 weeks.  If we can get that a little better it might actually improve your weight which I know you are concerned about.  Kidney function is stable at 1.2.  And hemoglobin stable no anemia.

## 2024-08-23 ENCOUNTER — Other Ambulatory Visit: Payer: Self-pay | Admitting: Family Medicine

## 2024-08-23 DIAGNOSIS — E1140 Type 2 diabetes mellitus with diabetic neuropathy, unspecified: Secondary | ICD-10-CM

## 2024-08-26 ENCOUNTER — Other Ambulatory Visit: Payer: Self-pay | Admitting: Family Medicine

## 2024-08-26 ENCOUNTER — Encounter: Payer: Self-pay | Admitting: Family Medicine

## 2024-08-26 DIAGNOSIS — E1140 Type 2 diabetes mellitus with diabetic neuropathy, unspecified: Secondary | ICD-10-CM

## 2024-08-29 NOTE — Telephone Encounter (Signed)
 Orders Placed This Encounter  Procedures   AMB Referral [facility] Care Management    Referral Priority:   Routine    Referral Type:   Consultation    Referral Reason:   Care Coordination    Number of Visits Requested:   1

## 2024-08-29 NOTE — Addendum Note (Signed)
 Addended by: Kenzlee Fishburn D on: 08/29/2024 11:41 AM   Modules accepted: Orders

## 2024-08-29 NOTE — Telephone Encounter (Signed)
 Has she talked with the pharmcy to see if this is bc of a deductible or if that is the price for the whole year.  Has she called her insurance to check on coverage ooptions?

## 2024-09-01 ENCOUNTER — Telehealth: Payer: Self-pay | Admitting: Family Medicine

## 2024-09-01 NOTE — Progress Notes (Signed)
 Care Guide Pharmacy Note  09/01/2024 Name: [name] MRN: [id number] DOB: Jan 10, 1946  Referred By: Alvan Dorothyann BIRCH, MD Reason for referral: No chief complaint on file.   [name] is a 79 y.o. year old female who is a primary care patient of Alvan, Dorothyann BIRCH, MD.  [name] was referred to the pharmacist for assistance related to: DMII  Successful contact was made with the patient to discuss pharmacy services including being ready for the pharmacist to call at least 5 minutes before the scheduled appointment time and to have medication bottles and any blood pressure readings ready for review. The patient agreed to meet with the pharmacist via telephone visit on 09/17/2024.  Doyce Razor [employer], [facility] Guide Direct Dial: [phone]  Fax: [phone]

## 2024-09-03 ENCOUNTER — Other Ambulatory Visit: Payer: Self-pay | Admitting: Family Medicine

## 2024-09-03 NOTE — Telephone Encounter (Signed)
 Request received for levothyroxine  125mcg refill Last written 07/24/2024 Last lab 01/13/226 note Orders placed for TSH for repeat testing in 6 to 8 weeks.  Last OV 01/132026 Upcoming appt  01/08/2025

## 2024-09-11 ENCOUNTER — Ambulatory Visit: Admitting: Podiatry

## 2024-09-17 ENCOUNTER — Other Ambulatory Visit

## 2024-09-19 ENCOUNTER — Ambulatory Visit: Admitting: Podiatry

## 2025-01-08 ENCOUNTER — Ambulatory Visit: Admitting: Family Medicine
